# Patient Record
Sex: Female | Born: 1955 | Race: White | Hispanic: No | Smoking: Never smoker
Health system: Southern US, Community
[De-identification: ages and names within clinical notes are randomized; demographics above are authoritative.]

## PROBLEM LIST (undated history)

## (undated) DIAGNOSIS — I82432 Acute embolism and thrombosis of left popliteal vein: Secondary | ICD-10-CM

## (undated) DIAGNOSIS — B20 Human immunodeficiency virus [HIV] disease: Secondary | ICD-10-CM

## (undated) DIAGNOSIS — I509 Heart failure, unspecified: Secondary | ICD-10-CM

## (undated) DIAGNOSIS — E119 Type 2 diabetes mellitus without complications: Secondary | ICD-10-CM

## (undated) DIAGNOSIS — Z923 Personal history of irradiation: Secondary | ICD-10-CM

## (undated) DIAGNOSIS — I10 Essential (primary) hypertension: Secondary | ICD-10-CM

## (undated) DIAGNOSIS — C8110 Nodular sclerosis classical Hodgkin lymphoma, unspecified site: Secondary | ICD-10-CM

## (undated) DIAGNOSIS — M1990 Unspecified osteoarthritis, unspecified site: Secondary | ICD-10-CM

## (undated) HISTORY — DX: Human immunodeficiency virus (HIV) disease: B20

## (undated) HISTORY — DX: Nodular sclerosis Hodgkin lymphoma, unspecified site: C81.10

## (undated) HISTORY — PX: SHOULDER SURGERY: SHX246

## (undated) HISTORY — PX: WISDOM TOOTH EXTRACTION: SHX21

## (undated) HISTORY — DX: Personal history of irradiation: Z92.3

## (undated) HISTORY — DX: Acute embolism and thrombosis of left popliteal vein: I82.432

## (undated) HISTORY — DX: Type 2 diabetes mellitus without complications: E11.9

---

## 1994-09-21 ENCOUNTER — Encounter (INDEPENDENT_AMBULATORY_CARE_PROVIDER_SITE_OTHER): Payer: Self-pay | Admitting: *Deleted

## 1994-09-21 LAB — CONVERTED CEMR LAB: CD4 T Cell Abs: 396

## 1997-06-15 ENCOUNTER — Encounter: Admission: RE | Admit: 1997-06-15 | Discharge: 1997-06-15 | Payer: Self-pay | Admitting: Internal Medicine

## 1997-06-30 ENCOUNTER — Encounter: Admission: RE | Admit: 1997-06-30 | Discharge: 1997-06-30 | Payer: Self-pay | Admitting: Internal Medicine

## 1997-09-07 ENCOUNTER — Encounter: Admission: RE | Admit: 1997-09-07 | Discharge: 1997-09-07 | Payer: Self-pay | Admitting: Internal Medicine

## 1997-09-22 ENCOUNTER — Encounter: Admission: RE | Admit: 1997-09-22 | Discharge: 1997-09-22 | Payer: Self-pay | Admitting: Internal Medicine

## 1997-12-14 ENCOUNTER — Ambulatory Visit (HOSPITAL_COMMUNITY): Admission: RE | Admit: 1997-12-14 | Discharge: 1997-12-14 | Payer: Self-pay | Admitting: Internal Medicine

## 1997-12-28 ENCOUNTER — Encounter: Admission: RE | Admit: 1997-12-28 | Discharge: 1997-12-28 | Payer: Self-pay | Admitting: Internal Medicine

## 1998-03-03 ENCOUNTER — Encounter: Admission: RE | Admit: 1998-03-03 | Discharge: 1998-03-03 | Payer: Self-pay | Admitting: Infectious Diseases

## 1998-03-18 ENCOUNTER — Other Ambulatory Visit: Admission: RE | Admit: 1998-03-18 | Discharge: 1998-03-18 | Payer: Self-pay | Admitting: Internal Medicine

## 1998-03-18 ENCOUNTER — Encounter: Admission: RE | Admit: 1998-03-18 | Discharge: 1998-03-18 | Payer: Self-pay | Admitting: Internal Medicine

## 1998-05-24 ENCOUNTER — Ambulatory Visit (HOSPITAL_COMMUNITY): Admission: RE | Admit: 1998-05-24 | Discharge: 1998-05-24 | Payer: Self-pay | Admitting: Internal Medicine

## 1998-06-07 ENCOUNTER — Encounter: Admission: RE | Admit: 1998-06-07 | Discharge: 1998-06-07 | Payer: Self-pay | Admitting: Internal Medicine

## 1998-08-16 ENCOUNTER — Ambulatory Visit (HOSPITAL_COMMUNITY): Admission: RE | Admit: 1998-08-16 | Discharge: 1998-08-16 | Payer: Self-pay | Admitting: Internal Medicine

## 1998-08-31 ENCOUNTER — Encounter: Admission: RE | Admit: 1998-08-31 | Discharge: 1998-08-31 | Payer: Self-pay | Admitting: Internal Medicine

## 1999-01-03 ENCOUNTER — Encounter: Admission: RE | Admit: 1999-01-03 | Discharge: 1999-01-03 | Payer: Self-pay | Admitting: Internal Medicine

## 1999-01-03 ENCOUNTER — Ambulatory Visit (HOSPITAL_COMMUNITY): Admission: RE | Admit: 1999-01-03 | Discharge: 1999-01-03 | Payer: Self-pay | Admitting: Internal Medicine

## 1999-01-17 ENCOUNTER — Encounter: Admission: RE | Admit: 1999-01-17 | Discharge: 1999-01-17 | Payer: Self-pay | Admitting: Internal Medicine

## 1999-05-09 ENCOUNTER — Encounter: Admission: RE | Admit: 1999-05-09 | Discharge: 1999-05-09 | Payer: Self-pay | Admitting: Internal Medicine

## 1999-05-09 ENCOUNTER — Ambulatory Visit (HOSPITAL_COMMUNITY): Admission: RE | Admit: 1999-05-09 | Discharge: 1999-05-09 | Payer: Self-pay | Admitting: Internal Medicine

## 1999-05-23 ENCOUNTER — Encounter: Admission: RE | Admit: 1999-05-23 | Discharge: 1999-05-23 | Payer: Self-pay | Admitting: Internal Medicine

## 1999-11-21 ENCOUNTER — Encounter: Admission: RE | Admit: 1999-11-21 | Discharge: 1999-11-21 | Payer: Self-pay | Admitting: Internal Medicine

## 1999-11-21 ENCOUNTER — Ambulatory Visit (HOSPITAL_COMMUNITY): Admission: RE | Admit: 1999-11-21 | Discharge: 1999-11-21 | Payer: Self-pay | Admitting: Internal Medicine

## 1999-12-05 ENCOUNTER — Encounter: Admission: RE | Admit: 1999-12-05 | Discharge: 1999-12-05 | Payer: Self-pay | Admitting: Internal Medicine

## 2000-05-08 ENCOUNTER — Encounter: Admission: RE | Admit: 2000-05-08 | Discharge: 2000-05-08 | Payer: Self-pay | Admitting: Hematology and Oncology

## 2000-05-08 ENCOUNTER — Ambulatory Visit (HOSPITAL_COMMUNITY): Admission: RE | Admit: 2000-05-08 | Discharge: 2000-05-08 | Payer: Self-pay | Admitting: Internal Medicine

## 2000-05-22 ENCOUNTER — Encounter: Admission: RE | Admit: 2000-05-22 | Discharge: 2000-05-22 | Payer: Self-pay | Admitting: Internal Medicine

## 2000-05-22 LAB — TB SKIN TEST: TB Skin Test: NEGATIVE mm

## 2000-10-11 ENCOUNTER — Ambulatory Visit (HOSPITAL_COMMUNITY): Admission: RE | Admit: 2000-10-11 | Discharge: 2000-10-11 | Payer: Self-pay | Admitting: Internal Medicine

## 2000-10-11 ENCOUNTER — Encounter: Admission: RE | Admit: 2000-10-11 | Discharge: 2000-10-11 | Payer: Self-pay | Admitting: Internal Medicine

## 2000-10-25 ENCOUNTER — Encounter: Admission: RE | Admit: 2000-10-25 | Discharge: 2000-10-25 | Payer: Self-pay | Admitting: Internal Medicine

## 2001-01-07 ENCOUNTER — Encounter: Admission: RE | Admit: 2001-01-07 | Discharge: 2001-01-07 | Payer: Self-pay | Admitting: Internal Medicine

## 2001-01-07 ENCOUNTER — Ambulatory Visit (HOSPITAL_COMMUNITY): Admission: RE | Admit: 2001-01-07 | Discharge: 2001-01-07 | Payer: Self-pay | Admitting: Internal Medicine

## 2001-01-22 ENCOUNTER — Encounter: Admission: RE | Admit: 2001-01-22 | Discharge: 2001-01-22 | Payer: Self-pay | Admitting: Internal Medicine

## 2001-03-18 ENCOUNTER — Encounter: Admission: RE | Admit: 2001-03-18 | Discharge: 2001-03-18 | Payer: Self-pay | Admitting: Internal Medicine

## 2001-03-18 ENCOUNTER — Ambulatory Visit (HOSPITAL_COMMUNITY): Admission: RE | Admit: 2001-03-18 | Discharge: 2001-03-18 | Payer: Self-pay | Admitting: Internal Medicine

## 2001-04-02 ENCOUNTER — Encounter: Admission: RE | Admit: 2001-04-02 | Discharge: 2001-04-02 | Payer: Self-pay | Admitting: Internal Medicine

## 2001-05-17 ENCOUNTER — Encounter: Admission: RE | Admit: 2001-05-17 | Discharge: 2001-05-17 | Payer: Self-pay | Admitting: Internal Medicine

## 2001-07-15 ENCOUNTER — Ambulatory Visit (HOSPITAL_COMMUNITY): Admission: RE | Admit: 2001-07-15 | Discharge: 2001-07-15 | Payer: Self-pay | Admitting: Internal Medicine

## 2001-07-30 ENCOUNTER — Encounter: Admission: RE | Admit: 2001-07-30 | Discharge: 2001-07-30 | Payer: Self-pay | Admitting: Internal Medicine

## 2001-08-09 ENCOUNTER — Encounter: Admission: RE | Admit: 2001-08-09 | Discharge: 2001-08-09 | Payer: Self-pay | Admitting: *Deleted

## 2001-09-17 ENCOUNTER — Encounter: Admission: RE | Admit: 2001-09-17 | Discharge: 2001-09-17 | Payer: Self-pay | Admitting: Internal Medicine

## 2001-12-30 ENCOUNTER — Ambulatory Visit (HOSPITAL_COMMUNITY): Admission: RE | Admit: 2001-12-30 | Discharge: 2001-12-30 | Payer: Self-pay | Admitting: Internal Medicine

## 2001-12-30 ENCOUNTER — Encounter: Admission: RE | Admit: 2001-12-30 | Discharge: 2001-12-30 | Payer: Self-pay | Admitting: Internal Medicine

## 2002-01-14 ENCOUNTER — Encounter: Admission: RE | Admit: 2002-01-14 | Discharge: 2002-01-14 | Payer: Self-pay | Admitting: Internal Medicine

## 2002-03-03 ENCOUNTER — Ambulatory Visit (HOSPITAL_COMMUNITY): Admission: RE | Admit: 2002-03-03 | Discharge: 2002-03-03 | Payer: Self-pay | Admitting: Internal Medicine

## 2002-03-03 ENCOUNTER — Encounter: Admission: RE | Admit: 2002-03-03 | Discharge: 2002-03-03 | Payer: Self-pay | Admitting: Internal Medicine

## 2002-04-22 ENCOUNTER — Encounter: Admission: RE | Admit: 2002-04-22 | Discharge: 2002-04-22 | Payer: Self-pay | Admitting: Internal Medicine

## 2002-05-02 ENCOUNTER — Encounter: Admission: RE | Admit: 2002-05-02 | Discharge: 2002-05-02 | Payer: Self-pay | Admitting: Infectious Diseases

## 2002-07-08 ENCOUNTER — Encounter: Admission: RE | Admit: 2002-07-08 | Discharge: 2002-07-08 | Payer: Self-pay | Admitting: Internal Medicine

## 2002-11-03 ENCOUNTER — Ambulatory Visit (HOSPITAL_COMMUNITY): Admission: RE | Admit: 2002-11-03 | Discharge: 2002-11-03 | Payer: Self-pay | Admitting: Internal Medicine

## 2002-11-03 ENCOUNTER — Encounter: Admission: RE | Admit: 2002-11-03 | Discharge: 2002-11-03 | Payer: Self-pay | Admitting: Internal Medicine

## 2002-11-17 ENCOUNTER — Encounter: Admission: RE | Admit: 2002-11-17 | Discharge: 2002-11-17 | Payer: Self-pay | Admitting: Internal Medicine

## 2002-12-09 ENCOUNTER — Encounter: Admission: RE | Admit: 2002-12-09 | Discharge: 2002-12-09 | Payer: Self-pay | Admitting: Internal Medicine

## 2003-02-03 ENCOUNTER — Encounter: Admission: RE | Admit: 2003-02-03 | Discharge: 2003-02-03 | Payer: Self-pay | Admitting: Internal Medicine

## 2003-02-03 ENCOUNTER — Ambulatory Visit (HOSPITAL_COMMUNITY): Admission: RE | Admit: 2003-02-03 | Discharge: 2003-02-03 | Payer: Self-pay | Admitting: Internal Medicine

## 2003-02-06 ENCOUNTER — Encounter: Admission: RE | Admit: 2003-02-06 | Discharge: 2003-02-06 | Payer: Self-pay | Admitting: Infectious Diseases

## 2003-02-17 ENCOUNTER — Encounter: Admission: RE | Admit: 2003-02-17 | Discharge: 2003-02-17 | Payer: Self-pay | Admitting: Internal Medicine

## 2003-05-05 ENCOUNTER — Encounter: Admission: RE | Admit: 2003-05-05 | Discharge: 2003-05-05 | Payer: Self-pay | Admitting: Internal Medicine

## 2003-05-05 ENCOUNTER — Ambulatory Visit (HOSPITAL_COMMUNITY): Admission: RE | Admit: 2003-05-05 | Discharge: 2003-05-05 | Payer: Self-pay | Admitting: Internal Medicine

## 2003-05-19 ENCOUNTER — Encounter: Admission: RE | Admit: 2003-05-19 | Discharge: 2003-05-19 | Payer: Self-pay | Admitting: Internal Medicine

## 2003-07-21 ENCOUNTER — Encounter: Admission: RE | Admit: 2003-07-21 | Discharge: 2003-07-21 | Payer: Self-pay | Admitting: Internal Medicine

## 2003-08-18 ENCOUNTER — Encounter: Admission: RE | Admit: 2003-08-18 | Discharge: 2003-08-18 | Payer: Self-pay | Admitting: Internal Medicine

## 2003-08-18 ENCOUNTER — Ambulatory Visit (HOSPITAL_COMMUNITY): Admission: RE | Admit: 2003-08-18 | Discharge: 2003-08-18 | Payer: Self-pay | Admitting: Internal Medicine

## 2003-12-15 ENCOUNTER — Ambulatory Visit (HOSPITAL_COMMUNITY): Admission: RE | Admit: 2003-12-15 | Discharge: 2003-12-15 | Payer: Self-pay | Admitting: Internal Medicine

## 2003-12-15 ENCOUNTER — Ambulatory Visit: Payer: Self-pay | Admitting: Internal Medicine

## 2004-01-29 ENCOUNTER — Ambulatory Visit: Payer: Self-pay | Admitting: Infectious Diseases

## 2004-03-29 ENCOUNTER — Ambulatory Visit: Payer: Self-pay | Admitting: Internal Medicine

## 2004-03-29 ENCOUNTER — Ambulatory Visit (HOSPITAL_COMMUNITY): Admission: RE | Admit: 2004-03-29 | Discharge: 2004-03-29 | Payer: Self-pay | Admitting: Internal Medicine

## 2004-04-12 ENCOUNTER — Ambulatory Visit: Payer: Self-pay | Admitting: Internal Medicine

## 2004-09-04 ENCOUNTER — Emergency Department (HOSPITAL_COMMUNITY): Admission: EM | Admit: 2004-09-04 | Discharge: 2004-09-04 | Payer: Self-pay | Admitting: Emergency Medicine

## 2004-10-05 ENCOUNTER — Ambulatory Visit (HOSPITAL_COMMUNITY): Admission: RE | Admit: 2004-10-05 | Discharge: 2004-10-05 | Payer: Self-pay | Admitting: Internal Medicine

## 2004-10-05 ENCOUNTER — Ambulatory Visit: Payer: Self-pay | Admitting: Internal Medicine

## 2004-10-05 ENCOUNTER — Encounter (INDEPENDENT_AMBULATORY_CARE_PROVIDER_SITE_OTHER): Payer: Self-pay | Admitting: *Deleted

## 2004-10-05 LAB — CONVERTED CEMR LAB
CD4 Count: 840 microliters
HIV 1 RNA Quant: 399 copies/mL

## 2004-10-20 ENCOUNTER — Ambulatory Visit: Payer: Self-pay | Admitting: Internal Medicine

## 2004-11-08 ENCOUNTER — Encounter: Admission: RE | Admit: 2004-11-08 | Discharge: 2005-02-06 | Payer: Self-pay | Admitting: Internal Medicine

## 2005-02-23 ENCOUNTER — Emergency Department (HOSPITAL_COMMUNITY): Admission: EM | Admit: 2005-02-23 | Discharge: 2005-02-23 | Payer: Self-pay | Admitting: Family Medicine

## 2005-04-11 ENCOUNTER — Ambulatory Visit: Payer: Self-pay | Admitting: Internal Medicine

## 2005-04-11 ENCOUNTER — Encounter (INDEPENDENT_AMBULATORY_CARE_PROVIDER_SITE_OTHER): Payer: Self-pay | Admitting: *Deleted

## 2005-04-11 LAB — CONVERTED CEMR LAB: HIV 1 RNA Quant: 399 copies/mL

## 2005-04-25 ENCOUNTER — Ambulatory Visit: Payer: Self-pay | Admitting: Internal Medicine

## 2005-10-31 ENCOUNTER — Ambulatory Visit: Payer: Self-pay | Admitting: Internal Medicine

## 2005-10-31 ENCOUNTER — Encounter: Admission: RE | Admit: 2005-10-31 | Discharge: 2005-10-31 | Payer: Self-pay | Admitting: Internal Medicine

## 2005-10-31 ENCOUNTER — Encounter (INDEPENDENT_AMBULATORY_CARE_PROVIDER_SITE_OTHER): Payer: Self-pay | Admitting: *Deleted

## 2005-11-15 ENCOUNTER — Ambulatory Visit: Payer: Self-pay | Admitting: Internal Medicine

## 2006-02-06 ENCOUNTER — Ambulatory Visit: Payer: Self-pay | Admitting: Internal Medicine

## 2006-02-15 ENCOUNTER — Encounter: Payer: Self-pay | Admitting: Internal Medicine

## 2006-02-15 ENCOUNTER — Ambulatory Visit: Payer: Self-pay | Admitting: Internal Medicine

## 2006-02-23 DIAGNOSIS — F329 Major depressive disorder, single episode, unspecified: Secondary | ICD-10-CM | POA: Insufficient documentation

## 2006-02-23 DIAGNOSIS — B20 Human immunodeficiency virus [HIV] disease: Secondary | ICD-10-CM | POA: Insufficient documentation

## 2006-02-23 DIAGNOSIS — I10 Essential (primary) hypertension: Secondary | ICD-10-CM | POA: Insufficient documentation

## 2006-02-23 DIAGNOSIS — E785 Hyperlipidemia, unspecified: Secondary | ICD-10-CM | POA: Insufficient documentation

## 2006-02-23 HISTORY — DX: Human immunodeficiency virus (HIV) disease: B20

## 2006-03-16 ENCOUNTER — Encounter: Payer: Self-pay | Admitting: Internal Medicine

## 2006-04-16 ENCOUNTER — Encounter (INDEPENDENT_AMBULATORY_CARE_PROVIDER_SITE_OTHER): Payer: Self-pay | Admitting: *Deleted

## 2006-04-16 LAB — CONVERTED CEMR LAB

## 2006-04-29 ENCOUNTER — Encounter (INDEPENDENT_AMBULATORY_CARE_PROVIDER_SITE_OTHER): Payer: Self-pay | Admitting: *Deleted

## 2006-05-03 ENCOUNTER — Encounter: Admission: RE | Admit: 2006-05-03 | Discharge: 2006-05-03 | Payer: Self-pay | Admitting: Internal Medicine

## 2006-05-03 ENCOUNTER — Ambulatory Visit: Payer: Self-pay | Admitting: Internal Medicine

## 2006-05-03 LAB — CONVERTED CEMR LAB
ALT: 27 units/L (ref 0–35)
Alkaline Phosphatase: 48 units/L (ref 39–117)
Basophils Absolute: 0 10*3/uL (ref 0.0–0.1)
Basophils Relative: 0 % (ref 0–1)
Eosinophils Absolute: 0.2 10*3/uL (ref 0.0–0.7)
Eosinophils Relative: 2 % (ref 0–5)
HCT: 44.9 % (ref 36.0–46.0)
HIV 1 RNA Quant: 324 copies/mL — ABNORMAL HIGH (ref <50)
HIV-1 RNA Quant, Log: 2.51 — ABNORMAL HIGH (ref <1.70)
LDL Cholesterol: 144 mg/dL — ABNORMAL HIGH (ref 0–99)
MCHC: 33.6 g/dL (ref 30.0–36.0)
MCV: 105.9 fL — ABNORMAL HIGH (ref 78.0–100.0)
Platelets: 263 10*3/uL (ref 150–400)
RDW: 14.1 % — ABNORMAL HIGH (ref 11.5–14.0)
Sodium: 142 meq/L (ref 135–145)
Total Bilirubin: 0.4 mg/dL (ref 0.3–1.2)
Total CHOL/HDL Ratio: 6.1
Total Protein: 7.6 g/dL (ref 6.0–8.3)
VLDL: 65 mg/dL — ABNORMAL HIGH (ref 0–40)

## 2006-05-17 ENCOUNTER — Ambulatory Visit: Payer: Self-pay | Admitting: Internal Medicine

## 2006-05-17 DIAGNOSIS — E669 Obesity, unspecified: Secondary | ICD-10-CM | POA: Insufficient documentation

## 2006-05-17 DIAGNOSIS — G5600 Carpal tunnel syndrome, unspecified upper limb: Secondary | ICD-10-CM | POA: Insufficient documentation

## 2006-05-17 DIAGNOSIS — M79609 Pain in unspecified limb: Secondary | ICD-10-CM | POA: Insufficient documentation

## 2006-06-13 ENCOUNTER — Encounter: Payer: Self-pay | Admitting: Internal Medicine

## 2006-07-13 ENCOUNTER — Encounter: Payer: Self-pay | Admitting: Internal Medicine

## 2006-09-17 ENCOUNTER — Encounter (INDEPENDENT_AMBULATORY_CARE_PROVIDER_SITE_OTHER): Payer: Self-pay | Admitting: *Deleted

## 2006-11-05 ENCOUNTER — Encounter: Admission: RE | Admit: 2006-11-05 | Discharge: 2006-11-05 | Payer: Self-pay | Admitting: Internal Medicine

## 2006-11-05 ENCOUNTER — Ambulatory Visit: Payer: Self-pay | Admitting: Internal Medicine

## 2006-11-07 ENCOUNTER — Telehealth: Payer: Self-pay | Admitting: Internal Medicine

## 2006-11-20 ENCOUNTER — Ambulatory Visit: Payer: Self-pay | Admitting: Internal Medicine

## 2007-02-19 ENCOUNTER — Encounter: Payer: Self-pay | Admitting: Internal Medicine

## 2007-05-02 ENCOUNTER — Encounter: Admission: RE | Admit: 2007-05-02 | Discharge: 2007-05-02 | Payer: Self-pay | Admitting: Internal Medicine

## 2007-05-02 ENCOUNTER — Ambulatory Visit: Payer: Self-pay | Admitting: Internal Medicine

## 2007-05-02 LAB — CONVERTED CEMR LAB
AST: 14 units/L (ref 0–37)
Albumin: 4.4 g/dL (ref 3.5–5.2)
Alkaline Phosphatase: 48 units/L (ref 39–117)
BUN: 11 mg/dL (ref 6–23)
CO2: 26 meq/L (ref 19–32)
Calcium: 9.5 mg/dL (ref 8.4–10.5)
HCT: 46.9 % — ABNORMAL HIGH (ref 36.0–46.0)
HIV-1 RNA Quant, Log: 2.55 — ABNORMAL HIGH (ref <1.70)
Hemoglobin: 15.4 g/dL — ABNORMAL HIGH (ref 12.0–15.0)
LDL Cholesterol: 185 mg/dL — ABNORMAL HIGH (ref 0–99)
MCV: 108.3 fL — ABNORMAL HIGH (ref 78.0–100.0)
Platelets: 243 10*3/uL (ref 150–400)
RDW: 13.8 % (ref 11.5–15.5)
Total Protein: 7.5 g/dL (ref 6.0–8.3)
Triglycerides: 295 mg/dL — ABNORMAL HIGH (ref <150)
WBC: 7 10*3/uL (ref 4.0–10.5)

## 2007-05-10 ENCOUNTER — Encounter: Payer: Self-pay | Admitting: Internal Medicine

## 2007-05-14 ENCOUNTER — Ambulatory Visit: Payer: Self-pay | Admitting: Internal Medicine

## 2007-05-24 ENCOUNTER — Ambulatory Visit: Payer: Self-pay | Admitting: Internal Medicine

## 2007-05-24 ENCOUNTER — Encounter: Payer: Self-pay | Admitting: Internal Medicine

## 2007-05-24 LAB — CONVERTED CEMR LAB: Pap Smear: NORMAL

## 2007-05-29 ENCOUNTER — Encounter: Payer: Self-pay | Admitting: Internal Medicine

## 2007-06-10 ENCOUNTER — Encounter: Payer: Self-pay | Admitting: Internal Medicine

## 2007-06-17 ENCOUNTER — Encounter: Payer: Self-pay | Admitting: Internal Medicine

## 2007-08-08 ENCOUNTER — Emergency Department (HOSPITAL_COMMUNITY): Admission: EM | Admit: 2007-08-08 | Discharge: 2007-08-08 | Payer: Self-pay | Admitting: Family Medicine

## 2007-10-15 ENCOUNTER — Emergency Department (HOSPITAL_COMMUNITY): Admission: EM | Admit: 2007-10-15 | Discharge: 2007-10-15 | Payer: Self-pay | Admitting: Family Medicine

## 2007-10-20 ENCOUNTER — Encounter: Admission: RE | Admit: 2007-10-20 | Discharge: 2007-10-20 | Payer: Self-pay | Admitting: Orthopedic Surgery

## 2007-10-29 ENCOUNTER — Ambulatory Visit: Payer: Self-pay | Admitting: Internal Medicine

## 2007-10-29 LAB — CONVERTED CEMR LAB
ALT: 28 units/L (ref 0–35)
AST: 19 units/L (ref 0–37)
Albumin: 4.4 g/dL (ref 3.5–5.2)
Calcium: 9.8 mg/dL (ref 8.4–10.5)
Chloride: 103 meq/L (ref 96–112)
HIV 1 RNA Quant: 368 copies/mL — ABNORMAL HIGH (ref <50)
HIV-1 RNA Quant, Log: 2.57 — ABNORMAL HIGH (ref <1.70)
Platelets: 259 10*3/uL (ref 150–400)
Potassium: 4.7 meq/L (ref 3.5–5.3)
RDW: 14.4 % (ref 11.5–15.5)
Total CHOL/HDL Ratio: 7.2
Total Protein: 7.7 g/dL (ref 6.0–8.3)

## 2007-11-12 ENCOUNTER — Ambulatory Visit: Payer: Self-pay | Admitting: Internal Medicine

## 2007-11-12 DIAGNOSIS — M7500 Adhesive capsulitis of unspecified shoulder: Secondary | ICD-10-CM | POA: Insufficient documentation

## 2008-03-18 ENCOUNTER — Telehealth (INDEPENDENT_AMBULATORY_CARE_PROVIDER_SITE_OTHER): Payer: Self-pay | Admitting: *Deleted

## 2008-04-27 ENCOUNTER — Ambulatory Visit: Payer: Self-pay | Admitting: Internal Medicine

## 2008-04-27 LAB — CONVERTED CEMR LAB
ALT: 21 units/L (ref 0–35)
AST: 14 units/L (ref 0–37)
Alkaline Phosphatase: 44 units/L (ref 39–117)
Basophils Relative: 0 % (ref 0–1)
CO2: 23 meq/L (ref 19–32)
Cholesterol: 262 mg/dL — ABNORMAL HIGH (ref 0–200)
HIV 1 RNA Quant: 153 copies/mL — ABNORMAL HIGH (ref <48)
HIV-1 RNA Quant, Log: 2.18 — ABNORMAL HIGH (ref <1.68)
Hemoglobin: 14.6 g/dL (ref 12.0–15.0)
LDL Cholesterol: 161 mg/dL — ABNORMAL HIGH (ref 0–99)
Monocytes Relative: 7 % (ref 3–12)
Neutro Abs: 4.7 10*3/uL (ref 1.7–7.7)
Neutrophils Relative %: 55 % (ref 43–77)
RBC: 3.85 M/uL — ABNORMAL LOW (ref 3.87–5.11)
RDW: 14 % (ref 11.5–15.5)
Sodium: 143 meq/L (ref 135–145)
Total Bilirubin: 0.3 mg/dL (ref 0.3–1.2)
Total Protein: 7.1 g/dL (ref 6.0–8.3)
VLDL: 62 mg/dL — ABNORMAL HIGH (ref 0–40)

## 2008-05-12 ENCOUNTER — Ambulatory Visit: Payer: Self-pay | Admitting: Internal Medicine

## 2008-05-29 ENCOUNTER — Ambulatory Visit: Payer: Self-pay | Admitting: Internal Medicine

## 2008-05-29 ENCOUNTER — Encounter: Payer: Self-pay | Admitting: Internal Medicine

## 2008-06-04 ENCOUNTER — Encounter: Payer: Self-pay | Admitting: Internal Medicine

## 2008-06-23 ENCOUNTER — Encounter (INDEPENDENT_AMBULATORY_CARE_PROVIDER_SITE_OTHER): Payer: Self-pay | Admitting: *Deleted

## 2008-10-20 ENCOUNTER — Ambulatory Visit: Payer: Self-pay | Admitting: Internal Medicine

## 2008-10-22 ENCOUNTER — Encounter: Payer: Self-pay | Admitting: Internal Medicine

## 2008-11-03 ENCOUNTER — Ambulatory Visit: Payer: Self-pay | Admitting: Internal Medicine

## 2008-11-03 DIAGNOSIS — M719 Bursopathy, unspecified: Secondary | ICD-10-CM

## 2008-11-03 DIAGNOSIS — M67919 Unspecified disorder of synovium and tendon, unspecified shoulder: Secondary | ICD-10-CM | POA: Insufficient documentation

## 2008-11-05 ENCOUNTER — Encounter: Payer: Self-pay | Admitting: Internal Medicine

## 2008-11-06 ENCOUNTER — Encounter: Payer: Self-pay | Admitting: Internal Medicine

## 2008-12-02 ENCOUNTER — Ambulatory Visit: Payer: Self-pay | Admitting: Internal Medicine

## 2009-02-23 ENCOUNTER — Ambulatory Visit: Payer: Self-pay | Admitting: Internal Medicine

## 2009-02-23 LAB — CONVERTED CEMR LAB
Albumin: 4.2 g/dL (ref 3.5–5.2)
Alkaline Phosphatase: 42 units/L (ref 39–117)
BUN: 12 mg/dL (ref 6–23)
Basophils Absolute: 0 10*3/uL (ref 0.0–0.1)
Basophils Relative: 0 % (ref 0–1)
CO2: 23 meq/L (ref 19–32)
Cholesterol: 288 mg/dL — ABNORMAL HIGH (ref 0–200)
Eosinophils Relative: 2 % (ref 0–5)
Glucose, Bld: 93 mg/dL (ref 70–99)
HCT: 44.4 % (ref 36.0–46.0)
HDL: 45 mg/dL (ref >39)
HIV-1 RNA Quant, Log: 1.97 — ABNORMAL HIGH (ref <1.68)
Hemoglobin: 14.9 g/dL (ref 12.0–15.0)
LDL Cholesterol: 187 mg/dL — ABNORMAL HIGH (ref 0–99)
MCHC: 33.6 g/dL (ref 30.0–36.0)
MCV: 115.9 fL — ABNORMAL HIGH (ref >=78.0)
Monocytes Absolute: 0.6 10*3/uL (ref 0.1–1.0)
Monocytes Relative: 8 % (ref 3–12)
RDW: 14.3 % (ref 11.5–15.5)
Total Bilirubin: 0.5 mg/dL (ref 0.3–1.2)
Triglycerides: 282 mg/dL — ABNORMAL HIGH (ref <150)

## 2009-03-17 ENCOUNTER — Ambulatory Visit: Payer: Self-pay | Admitting: Internal Medicine

## 2009-03-23 ENCOUNTER — Encounter: Payer: Self-pay | Admitting: Internal Medicine

## 2009-06-17 ENCOUNTER — Ambulatory Visit: Payer: Self-pay | Admitting: Internal Medicine

## 2009-06-17 LAB — CONVERTED CEMR LAB
Cholesterol: 290 mg/dL — ABNORMAL HIGH (ref 0–200)
HDL: 44 mg/dL (ref >39)
HIV-1 RNA Quant, Log: <1.68 (ref <1.68)
Total CHOL/HDL Ratio: 6.6
VLDL: 57 mg/dL — ABNORMAL HIGH (ref 0–40)

## 2009-06-21 ENCOUNTER — Encounter: Payer: Self-pay | Admitting: Internal Medicine

## 2009-07-06 ENCOUNTER — Ambulatory Visit: Payer: Self-pay | Admitting: Internal Medicine

## 2009-07-23 ENCOUNTER — Ambulatory Visit: Payer: Self-pay | Admitting: Internal Medicine

## 2009-07-28 ENCOUNTER — Encounter (INDEPENDENT_AMBULATORY_CARE_PROVIDER_SITE_OTHER): Payer: Self-pay | Admitting: *Deleted

## 2009-07-30 ENCOUNTER — Encounter: Payer: Self-pay | Admitting: Internal Medicine

## 2009-08-26 ENCOUNTER — Encounter: Payer: Self-pay | Admitting: Internal Medicine

## 2009-11-23 ENCOUNTER — Emergency Department (HOSPITAL_COMMUNITY): Admission: EM | Admit: 2009-11-23 | Discharge: 2009-11-23 | Payer: Self-pay | Admitting: Family Medicine

## 2009-11-24 ENCOUNTER — Encounter: Payer: Self-pay | Admitting: Internal Medicine

## 2009-12-08 ENCOUNTER — Encounter: Payer: Self-pay | Admitting: Internal Medicine

## 2009-12-10 ENCOUNTER — Telehealth (INDEPENDENT_AMBULATORY_CARE_PROVIDER_SITE_OTHER): Payer: Self-pay | Admitting: Licensed Clinical Social Worker

## 2009-12-23 ENCOUNTER — Ambulatory Visit: Payer: Self-pay | Admitting: Internal Medicine

## 2010-01-18 ENCOUNTER — Ambulatory Visit: Payer: Self-pay | Admitting: Internal Medicine

## 2010-03-22 NOTE — Medication Information (Signed)
Summary: [facility]: Antiretroviral Therapy  [facility]: Antiretroviral Therapy   Imported By: Florinda Marker 03/23/2009 14:44:06  _____________________________________________________________________  External Attachment:    Type:   Image     Comment:   External Document

## 2010-03-22 NOTE — Assessment & Plan Note (Signed)
Summary: fu resfrom 1/17kam   CC:  follow-up visit and Depression.  History of Present Illness: Holly Stephens is in for her routine visit.  She says that she did not do as well with her diet and exercise over the holidays and has gained weight.  She stopped using the weight watchers program because of cost but is still trying to follow the diet and hopes to get back with her exercise soon.  She also had problems following her exercise routine because she developed an ingrown toenail on her left great toe that became infected.  She saw Dr. Trey Paula Petrinitz who treated her with an antibiotic and trim the toenail.  He is planning on doing some further work to prevent future ingrown toenails.  Depression History:      The patient denies a depressed mood most of the day and a diminished interest in her usual daily activities.         Preventive Screening-Counseling & Management  Alcohol-Tobacco     Alcohol drinks/day: 0     Smoking Status: never     Passive Smoke Exposure: no  Caffeine-Diet-Exercise     Caffeine use/day: yes     Does Patient Exercise: yes     Type of exercise: walking, exercise tape     Exercise (avg: min/session): <30     Times/week: 5  Hep-HIV-STD-Contraception     HIV Risk: no risk noted  Safety-Violence-Falls     Seat Belt Use: yes      Sexual History:  n/a.        Drug Use:  never.     Prior Medication List:  COMBIVIR 150-300 MG TABS (LAMIVUDINE-ZIDOVUDINE) Take 1 tablet by mouth two times a day LEXIVA 700 MG TABS (FOSAMPRENAVIR CALCIUM) Take 1 tablet by mouth two times a day NORVIR 100 MG CAPS (RITONAVIR) Take 1 capsule by mouth two times a day METOPROLOL TARTRATE 50 MG TABS (METOPROLOL TARTRATE) Take 1 tablet by mouth once a day   Current Allergies (reviewed today): ! AUGMENTIN Vital Signs:  Patient profile:   55 year old female Menstrual status:  postmenopausal Height:      61 inches (154.94 cm) Weight:      275.7 pounds (125.32 kg) BMI:     52.28 Temp:      98.6 degrees F (37.00 degrees C) oral Pulse rate:   92 / minute BP sitting:   129 / 79  (right arm) Cuff size:   regular  Vitals Entered By: Jennet Maduro RN (March 17, 2009 3:49 PM) CC: follow-up visit, Depression Is Patient Diabetic? No Pain Assessment Patient in pain? no      Nutritional Status BMI of > 30 = obese Nutritional Status Detail appetite "too good"  Have you ever been in a relationship where you felt threatened, hurt or afraid?No   Does patient need assistance? Functional Status Self care Ambulation Normal Comments no missed doses of rxes        Medication Adherence: 03/17/2009   Adherence to medications reviewed with patient. Counseling to provide adequate adherence provided                                Physical Exam  General:  alert and overweight-appearing.  she has gained 14 pounds since her last visit.  There is no change in her lipoatrophy  in the face and extremities.  She carries most of her weight centrally. Mouth:  good dentition and  pharynx pink and moist.   Lungs:  normal breath sounds.  no crackles and no wheezes.   Heart:  normal rate, regular rhythm, and no murmur.   Abdomen:  soft and non-tender.  obese. Skin:  no rashes.   Psych:  normally interactive, good eye contact, not anxious appearing, and not depressed appearing.     Impression & Recommendations:  Problem # 1:  HIV DISEASE (ICD-042) Her HIV infection his back under good control.  I am not sure if that one viral load of 1800 was an error or a transient blip. She did not have any resistance mutations detected on a recent genotype.  I recommended changing the Lexiva to persist and continuing her other medications.  This is likely to give her a higher barrier to resistance. Diagnostics Reviewed:  CD4: 760 (02/24/2009)   WBC: 8.0 (02/23/2009)   PMN (bands): 0 (04/27/2008)   Hgb: 14.9 (02/23/2009)   HCT: 44.4 (02/23/2009)   Platelets: 261 (02/23/2009) HIV genotype: TNP  (10/22/2008)   HIV-1 RNA: 94 (02/23/2009)   HBSAg: NEG (11/03/2008)  Problem # 2:  OBESITY NOS (ICD-278.00) I have encouraged her to try to get back on her food plan and exercise more regularly.  I suggested she try to find friends and coworkers who were interested in making similar changes. Orders: Est. Patient Level IV (44034)  Problem # 3:  HYPERTENSION (ICD-401.9) Her blood pressure remains under good control. Her updated medication list for this problem includes:    Metoprolol Tartrate 50 Mg Tabs (Metoprolol tartrate) .Marland Kitchen... Take 1 tablet by mouth once a day  Orders: Est. Patient Level IV (99214)  BP today: 129/79 Prior BP: 131/79 (12/02/2008)  Labs Reviewed: K+: 4.5 (02/23/2009) Creat: : 0.62 (02/23/2009)   Chol: 288 (02/23/2009)   HDL: 45 (02/23/2009)   LDL: 187 (02/23/2009)   TG: 282 (02/23/2009)  Problem # 4:  HYPERLIPIDEMIA (ICD-272.4) Her dyslipidemia persists and is stable.  I doubt that she will be able to make significant progress with dietary changes alone.  I will consider adding medical therapy at her next visit. Orders: Est. Patient Level IV (74259)  Labs Reviewed: SGOT: 18 (02/23/2009)   SGPT: 21 (02/23/2009)   HDL:45 (02/23/2009), 39 (04/27/2008)  LDL:187 (02/23/2009), 161 (04/27/2008)  Chol:288 (02/23/2009), 262 (04/27/2008)  Trig:282 (02/23/2009), 310 (04/27/2008)  Medications Added to Medication List This Visit: 1)  [name] 600 Mg Tabs (Darunavir ethanolate) .... Take 1 tablet by mouth two times a day  Other Orders: Future Orders: T-CD4SP ([facility]) (CD4SP) ... 06/15/2009 T-HIV Viral Load [phone]) ... 06/15/2009 T-Lipid Profile [phone]) ... 06/15/2009  Patient Instructions: 1)  Please schedule a follow-up appointment in 3 months.  Prescriptions: METOPROLOL TARTRATE 50 MG TABS (METOPROLOL TARTRATE) Take 1 tablet by mouth once a day  #90 x 3   Entered and Authorized by:   Cliffton Asters MD   Signed by:   Cliffton Asters MD on 03/17/2009    Method used:   Print then Give to Patient   RxID:   [id number] NORVIR 100 MG CAPS (RITONAVIR) Take 1 capsule by mouth two times a day  #180 x 3   Entered and Authorized by:   Cliffton Asters MD   Signed by:   Cliffton Asters MD on 03/17/2009   Method used:   Print then Give to Patient   RxID:   [id number] [name] 600 MG TABS (DARUNAVIR ETHANOLATE) Take 1 tablet by mouth two times a day  #180 x 3   Entered and Authorized by:  Cliffton Asters MD   Signed by:   Cliffton Asters MD on 03/17/2009   Method used:   Print then Give to Patient   RxID:   [id number] COMBIVIR 150-300 MG TABS (LAMIVUDINE-ZIDOVUDINE) Take 1 tablet by mouth two times a day  #180 x 3   Entered and Authorized by:   Cliffton Asters MD   Signed by:   Cliffton Asters MD on 03/17/2009   Method used:   Print then Give to Patient   RxID:   [id number]  Process Orders Check Orders Results:     [facility]: ABN not required for this insurance Tests Sent for requisitioning (March 17, 2009 4:31 PM):     06/15/2009: [facility] -- T-HIV Viral Load [phone] (signed)     06/15/2009: [facility] -- T-Lipid Profile [phone] (signed)         Medication Adherence: 03/17/2009   Adherence to medications reviewed with patient. Counseling to provide adequate adherence provided

## 2010-03-22 NOTE — Assessment & Plan Note (Signed)
Summary: F/U/VS   CC:  follow-up visit - will make PAP smear appt. on the way out today.  History of Present Illness: Holly Stephens is in for her routine visit.  She has rejoined [employer] at work and has been able to loose some weight.  She is planning on getting more regular exercise now that the weather has improved.  She has not missed any doses of her medications.  She is not sexually active.  Preventive Screening-Counseling & Management  Alcohol-Tobacco     Alcohol drinks/day: 0     Smoking Status: never     Passive Smoke Exposure: no  Caffeine-Diet-Exercise     Caffeine use/day: yes     Does Patient Exercise: yes     Type of exercise: walking, exercise tape     Exercise (avg: min/session): <30     Times/week: 5  Hep-HIV-STD-Contraception     HIV Risk: no risk noted  Safety-Violence-Falls     Seat Belt Use: yes  Comments: declined condoms      Sexual History:  /a.        Drug Use:  never.     Prior Medication List:  COMBIVIR 150-300 MG TABS (LAMIVUDINE-ZIDOVUDINE) Take 1 tablet by mouth two times a day [name] 600 MG TABS (DARUNAVIR ETHANOLATE) Take 1 tablet by mouth two times a day NORVIR 100 MG CAPS (RITONAVIR) Take 1 capsule by mouth two times a day METOPROLOL TARTRATE 50 MG TABS (METOPROLOL TARTRATE) Take 1 tablet by mouth once a day   Current Allergies (reviewed today): ! AUGMENTIN Social History: Sexual History:  /a  Vital Signs:  Patient profile:   55 year old female Menstrual status:  postmenopausal Height:      61 inches (154.94 cm) Weight:      267.25 pounds (121.48 kg) BMI:     50.68 Temp:     97.9 degrees F (36.61 degrees C) oral Pulse rate:   76 / minute BP sitting:   132 / 75  (right arm) Cuff size:   regular  Vitals Entered By: Jennet Maduro RN (Jul 06, 2009 9:19 AM) CC: follow-up visit - will make PAP smear appt. on the way out today Is Patient Diabetic? No Pain Assessment Patient in pain? no      Nutritional Status  BMI of > 30 = obese Nutritional Status Detail appetite "fine"  Have you ever been in a relationship where you felt threatened, hurt or afraid?No   Does patient need assistance? Functional Status Self care Ambulation Normal Comments no missed doses of rxes   Physical Exam  General:  alert and overweight-appearing, but she has lost 12 pounds. Mouth:  good dentition and pharynx pink and moist.   Lungs:  normal breath sounds.  no crackles and no wheezes.   Heart:  normal rate, regular rhythm, and no murmur.   Abdomen:  marked central adiposity. Skin:  no rashes.   Axillary Nodes:  no R axillary adenopathy and no L axillary adenopathy.   Psych:  normally interactive, good eye contact, not anxious appearing, and not depressed appearing.          Medication Adherence: 07/06/2009   Adherence to medications reviewed with patient. Counseling to provide adequate adherence provided                                 Impression & Recommendations:  Problem # 1:  HIV DISEASE (ICD-042) Her HIV infection  is under excellent control.  I will continue her current regimen. Diagnostics Reviewed:  CD4: 870 (06/17/2009)   WBC: 8.0 (02/23/2009)   PMN (bands): 0 (04/27/2008)   Hgb: 14.9 (02/23/2009)   HCT: 44.4 (02/23/2009)   Platelets: 261 (02/23/2009) HIV genotype: TNP (10/22/2008)   HIV-1 RNA: <48 copies/mL (06/17/2009)   HBSAg: NEG (11/03/2008)  Problem # 2:  OBESITY NOS (ICD-278.00) I have encouraged her to continue weight watchers with a goal of steady but sustained weight loss.  Also encouraged her to increase her exercise and to try to follow a low fat diet. Orders: Est. Patient Level IV (01027)  Problem # 3:  HYPERLIPIDEMIA (ICD-272.4) see above.  I will start pravastatin today. Her updated medication list for this problem includes:    Pravachol 40 Mg Tabs (Pravastatin sodium) .Marland Kitchen... Take 1 tablet by mouth once a day  Orders: Est. Patient Level IV (25366)  Problem # 4:   HYPERTENSION (ICD-401.9) Her blood pressure remains under good control. Her updated medication list for this problem includes:    Metoprolol Tartrate 50 Mg Tabs (Metoprolol tartrate) .Marland Kitchen... Take 1 tablet by mouth once a day  Orders: Est. Patient Level IV (44034)  Medications Added to Medication List This Visit: 1)  Pravachol 40 Mg Tabs (Pravastatin sodium) .... Take 1 tablet by mouth once a day  Other Orders: Future Orders: T-CD4SP ([facility]) (CD4SP) ... 01/02/2010 T-HIV Viral Load [phone]) ... 01/02/2010 T-Lipid Profile [phone]) ... 01/02/2010  Patient Instructions: 1)  Please schedule a follow-up appointment in 6 months. 2)  She and will schedule her annual pelvic exam and Pap smear. Prescriptions: PRAVACHOL 40 MG TABS (PRAVASTATIN SODIUM) Take 1 tablet by mouth once a day  #90 x 3   Entered and Authorized by:   Cliffton Asters MD   Signed by:   Cliffton Asters MD on 07/06/2009   Method used:   Print then Give to Patient   RxID:   [id number]

## 2010-03-22 NOTE — Letter (Signed)
Summary: Holly Stephens: Financial Application  [facility]: Financial Application   Imported By: Florinda Marker 11/30/2009 11:53:56  _____________________________________________________________________  External Attachment:    Type:   Image     Comment:   External Document

## 2010-03-22 NOTE — Miscellaneous (Signed)
Summary: clincal update/[facility]  Clinical Lists Changes  Observations: Added new observation of PCTFPL: 331.30  (07/28/2009 10:18) Added new observation of HOUSEINCOME: [address]109  (07/28/2009 10:18) Added new observation of FINASSESSDT: 07/26/2009  (07/28/2009 10:18) Added new observation of YEARLYEXPEN: 14098  (07/28/2009 10:18)

## 2010-03-22 NOTE — Assessment & Plan Note (Signed)
Summary: F/U/VS   CC:  Had a bad case of hives after changing her fabric softener, has to go to the Urgent Care and received prednisone and claritin to control the itching and rash.  Having problem from time to time with her knees, esp. going up and down stairs, and Depression.  History of Present Illness: Holly Stephens is in for her routine visit. She has not missed a single dose of her medication since her last visit. She has been taking Zumba classes in her ongoing attempt to lose weight.  She recently received her influenza vaccine at work.  Depression History:      The patient denies a depressed mood most of the day and a diminished interest in her usual daily activities.         Preventive Screening-Counseling & Management  Alcohol-Tobacco     Alcohol drinks/day: 0     Smoking Status: never     Passive Smoke Exposure: no  Caffeine-Diet-Exercise     Caffeine use/day: yes     Does Patient Exercise: yes     Type of exercise: walking, exercise tape, started Zumba     Exercise (avg: min/session): <30     Times/week: 5  Hep-HIV-STD-Contraception     HIV Risk: no risk noted     HIV Risk Counseling: not indicated-no HIV risk noted  Safety-Violence-Falls     Seat Belt Use: yes  Comments: declined condoms      Sexual History:  n/a.        Drug Use:  never.     Prior Medication List:  COMBIVIR 150-300 MG TABS (LAMIVUDINE-ZIDOVUDINE) Take 1 tablet by mouth two times a day [name] 600 MG TABS (DARUNAVIR ETHANOLATE) Take 1 tablet by mouth two times a day NORVIR 100 MG CAPS (RITONAVIR) Take 1 capsule by mouth two times a day METOPROLOL TARTRATE 50 MG TABS (METOPROLOL TARTRATE) Take 1 tablet by mouth once a day PRAVACHOL 40 MG TABS (PRAVASTATIN SODIUM) Take 1 tablet by mouth once a day   Current Allergies (reviewed today): ! AUGMENTIN Vital Signs:  Patient profile:   55 year old female Menstrual status:  postmenopausal Height:      61 inches (154.94 cm) Weight:      261  pounds (118.64 kg) BMI:     49.49 Temp:     97.6 degrees F (36.44 degrees C) oral Pulse rate:   71 / minute BP sitting:   132 / 76  (left arm) Cuff size:   regular  Vitals Entered By: Jennet Maduro RN (January 18, 2010 9:31 AM) CC: Had a bad case of hives after changing her fabric softener, has to go to the Urgent Care and received prednisone and claritin to control the itching and rash.  Having problem from time to time with her knees, esp. going up and down stairs, Depression Is Patient Diabetic? No Pain Assessment Patient in pain? no      Nutritional Status BMI of > 30 = obese Nutritional Status Detail appetite "fine"  Have you ever been in a relationship where you felt threatened, hurt or afraid?No   Does patient need assistance? Functional Status Self care Ambulation Normal Comments no missed doses of HIV rxes    Physical Exam  General:  alert and overweight-appearing.   Mouth:  good dentition and pharynx pink and moist.   Lungs:  normal breath sounds.  no crackles and no wheezes.   Heart:  normal rate, regular rhythm, and no murmur.   Skin:  no rashes.   Psych:  normally interactive, good eye contact, not anxious appearing, and not depressed appearing.     Impression & Recommendations:  Problem # 1:  HIV DISEASE (ICD-042) Unfortunately, her viral load was not done her recent blood work. However, her infection has been under very good control for many years and her adherence is excellent so I will continue her current regimen and repeat a full set of blood work before that visit in 3 months. Diagnostics Reviewed:  CD4: 820 (12/24/2009)   WBC: 8.0 (02/23/2009)   PMN (bands): 0 (04/27/2008)   Hgb: 14.9 (02/23/2009)   HCT: 44.4 (02/23/2009)   Platelets: 261 (02/23/2009) HIV genotype: TNP (10/22/2008)   HIV-1 RNA: <48 copies/mL (06/17/2009)   HBSAg: NEG (11/03/2008)  Problem # 2:  OBESITY NOS (ICD-278.00) I have encouraged her to continue to monitor her diet and  exercise regularly. Orders: Est. Patient Level IV ([address])  Problem # 3:  HYPERTENSION (ICD-401.9) Her blood pressure is well controlled. Her updated medication list for this problem includes:    Metoprolol Tartrate 50 Mg Tabs (Metoprolol tartrate) .Marland Kitchen... Take 1 tablet by mouth once a day  Orders: Est. Patient Level IV (60454)  Problem # 4:  HYPERLIPIDEMIA (ICD-272.4) Her lipid panel was not done as ordered.  She is not fasting currently so I will repeat her lab work before her visit in 3 months. Her updated medication list for this problem includes:    Pravachol 40 Mg Tabs (Pravastatin sodium) .Marland Kitchen... Take 1 tablet by mouth once a day  Orders: Est. Patient Level IV (09811)  Other Orders: Future Orders: T-CD4SP ([facility]) (CD4SP) ... 04/18/2010 T-HIV Viral Load [phone]) ... 04/18/2010 T-Comprehensive Metabolic Panel [phone]) ... 04/18/2010 T-CBC w/Diff ([id number]) ... 04/18/2010 T-RPR (Syphilis) [phone]) ... 04/18/2010 T-Lipid Profile [phone]) ... 04/18/2010  Patient Instructions: 1)  Please schedule a follow-up appointment in 3 months.      Influenza Immunization History:    Influenza # 1:  Historical (11/29/2009)

## 2010-03-22 NOTE — Assessment & Plan Note (Signed)
Summary: pap smear visit   Vital Signs:  Patient profile:   55 year old female Menstrual status:  postmenopausal  Vitals Entered By: Jennet Maduro RN (July 23, 2009 9:32 AM) CC: PAP smear visit.  Pt. declined condoms today.  Pt. given educational materials re:  HIV and women, PAP smears, heart disease, OIs, self-esteem, diet and exercise.   RN observed dry toenails and heels on bilateral feet.  RN discussed foot care to improve this dryness.  Pt. verbalized understanding.  Is Patient Diabetic? No Pain Assessment Patient in pain? no       Does patient need assistance? Functional Status Self care Ambulation Normal     Menstrual Status postmenopausal Last PAP Result NEGATIVE FOR INTRAEPITHELIAL LESIONS OR MALIGNANCY.   Preventive Screening-Counseling & Management  Alcohol-Tobacco     Alcohol drinks/day: 0     Smoking Status: never     Passive Smoke Exposure: no  Caffeine-Diet-Exercise     Caffeine use/day: yes     Does Patient Exercise: yes     Type of exercise: walking, exercise tape     Exercise (avg: min/session): <30     Times/week: 5  Hep-HIV-STD-Contraception     HIV Risk: no risk noted  Safety-Violence-Falls     Seat Belt Use: yes  Comments: declined condoms      Sexual History:  n/a.        Drug Use:  never.   Passive cigarette smoke exposure: no  Behavioral Health Assessment Frequency: 0 Do you use recreational drugs? never  Evaluation and Follow-Up  Prevention For Positives: 07/23/2009   Safe sex practices discussed with patient. Condoms offered. Prior Medications: COMBIVIR 150-300 MG TABS (LAMIVUDINE-ZIDOVUDINE) Take 1 tablet by mouth two times a day [name] 600 MG TABS (DARUNAVIR ETHANOLATE) Take 1 tablet by mouth two times a day NORVIR 100 MG CAPS (RITONAVIR) Take 1 capsule by mouth two times a day METOPROLOL TARTRATE 50 MG TABS (METOPROLOL TARTRATE) Take 1 tablet by mouth once a day PRAVACHOL 40 MG TABS (PRAVASTATIN SODIUM) Take 1 tablet  by mouth once a day Current Allergies: ! AUGMENTIN Orders Added: 1)  T-PAP [facility]) [78295] 2)  Est. Patient Level I [62130]             Prevention For Positives: 07/23/2009   Safe sex practices discussed with patient. Condoms offered.

## 2010-03-22 NOTE — Letter (Signed)
Summary: Results Follow-up Letter  [facility]  [address]   [place], [place] [address]   Phone: [phone]  Fax: [phone]          July 30, 2009  [address] [place], [place]  [address]  Dear Ms. Andrey Campanile,   The following are the results of your recent test(s):  Test     Result     Pap Smear    Normal_______  Not Normal_____       Comments:  Everything was fine.  I will see you in one year for your next PAP Smear.  Thank you for coming to the Center  Sincerely,    Jennet Maduro [facility]

## 2010-03-22 NOTE — Miscellaneous (Signed)
Summary: [facility]: Home Health Cert. & Plan Of Care  [facility]: Home Health Cert. & Plan Of Care   Imported By: Florinda Marker 12/29/2009 16:19:30  _____________________________________________________________________  External Attachment:    Type:   Image     Comment:   External Document

## 2010-03-22 NOTE — Miscellaneous (Signed)
Summary: [facility]CP Continuous Care:  HCP Continuous Care:   Imported By: Florinda Marker 09/09/2009 11:30:14  _____________________________________________________________________  External Attachment:    Type:   Image     Comment:   External Document

## 2010-03-22 NOTE — Progress Notes (Signed)
Summary: [facility] medications arrived  Phone Note Outgoing Call   Call placed by: Starleen Arms CMA,  December 10, 2009 4:44 PM Call placed to: Patient Summary of Call: [facility] medications arrived, left message on voicemail. Initial call taken by: Starleen Arms CMA,  December 10, 2009 4:46 PM

## 2010-05-04 LAB — T-HELPER CELL (CD4) - (RCID CLINIC ONLY): CD4 % Helper T Cell: 31 % — ABNORMAL LOW (ref 33–55)

## 2010-05-05 ENCOUNTER — Encounter: Payer: Self-pay | Admitting: Internal Medicine

## 2010-05-05 ENCOUNTER — Other Ambulatory Visit (INDEPENDENT_AMBULATORY_CARE_PROVIDER_SITE_OTHER): Payer: 59

## 2010-05-05 DIAGNOSIS — B20 Human immunodeficiency virus [HIV] disease: Secondary | ICD-10-CM

## 2010-05-05 LAB — CONVERTED CEMR LAB
ALT: 27 units/L (ref 0–35)
Alkaline Phosphatase: 49 units/L (ref 39–117)
CO2: 28 meq/L (ref 19–32)
Creatinine, Ser: 0.64 mg/dL (ref 0.40–1.20)
Eosinophils Absolute: 0.1 10*3/uL (ref 0.0–0.7)
Eosinophils Relative: 2 % (ref 0–5)
HCT: 44.6 % (ref 36.0–46.0)
HIV 1 RNA Quant: <20 copies/mL (ref <20)
HIV-1 RNA Quant, Log: <1.3 (ref <1.30)
LDL Cholesterol: 160 mg/dL — ABNORMAL HIGH (ref 0–99)
Lymphs Abs: 2.7 10*3/uL (ref 0.7–4.0)
MCHC: 33.6 g/dL (ref 30.0–36.0)
MCV: 115.2 fL — ABNORMAL HIGH (ref 78.0–100.0)
Monocytes Relative: 7 % (ref 3–12)
RBC: 3.87 M/uL (ref 3.87–5.11)
Total Bilirubin: 0.5 mg/dL (ref 0.3–1.2)
VLDL: 46 mg/dL — ABNORMAL HIGH (ref 0–40)
WBC: 7.6 10*3/uL (ref 4.0–10.5)

## 2010-05-08 LAB — T-HELPER CELL (CD4) - (RCID CLINIC ONLY)
CD4 % Helper T Cell: 31 % — ABNORMAL LOW (ref 33–55)
CD4 T Cell Abs: 760 uL (ref 400–2700)

## 2010-05-10 LAB — T-HELPER CELL (CD4) - (RCID CLINIC ONLY)
CD4 % Helper T Cell: 30 % — ABNORMAL LOW (ref 33–55)
CD4 T Cell Abs: 870 uL (ref 400–2700)

## 2010-05-17 ENCOUNTER — Encounter: Payer: Self-pay | Admitting: Internal Medicine

## 2010-05-17 ENCOUNTER — Ambulatory Visit (INDEPENDENT_AMBULATORY_CARE_PROVIDER_SITE_OTHER): Payer: 59 | Admitting: Internal Medicine

## 2010-05-17 VITALS — BP 144/80 | HR 56 | Temp 97.9°F | Ht 61.0 in | Wt 266.2 lb

## 2010-05-17 DIAGNOSIS — B20 Human immunodeficiency virus [HIV] disease: Secondary | ICD-10-CM

## 2010-05-17 NOTE — Progress Notes (Signed)
Holly Stephens is doing very well. She does not miss doses of her meds. She is enjoying Zumba classes.  Physical Examination: General appearance - alert, well appearing, and in no distress Mouth - mucous membranes moist, pharynx normal without lesions Chest - clear to auscultation, no wheezes, rales or rhonchi, symmetric air entry Heart - normal rate, regular rhythm, normal S1, S2, no murmurs

## 2010-05-17 NOTE — Assessment & Plan Note (Signed)
Holly Stephens's infection remains under excellent control. I will continue her current regimen.

## 2010-05-25 ENCOUNTER — Other Ambulatory Visit: Payer: Self-pay | Admitting: Internal Medicine

## 2010-05-25 DIAGNOSIS — E785 Hyperlipidemia, unspecified: Secondary | ICD-10-CM

## 2010-07-20 ENCOUNTER — Encounter: Payer: Self-pay | Admitting: Internal Medicine

## 2010-10-31 ENCOUNTER — Other Ambulatory Visit (INDEPENDENT_AMBULATORY_CARE_PROVIDER_SITE_OTHER): Payer: 59

## 2010-10-31 DIAGNOSIS — B20 Human immunodeficiency virus [HIV] disease: Secondary | ICD-10-CM

## 2010-10-31 LAB — CBC WITH DIFFERENTIAL/PLATELET
Basophils Absolute: 0 10*3/uL (ref 0.0–0.1)
Basophils Relative: 0 % (ref 0–1)
Eosinophils Absolute: 0.2 10*3/uL (ref 0.0–0.7)
HCT: 43.7 % (ref 36.0–46.0)
Hemoglobin: 15.2 g/dL — ABNORMAL HIGH (ref 12.0–15.0)
MCH: 39.9 pg — ABNORMAL HIGH (ref 26.0–34.0)
MCHC: 34.8 g/dL (ref 30.0–36.0)
Monocytes Absolute: 0.5 10*3/uL (ref 0.1–1.0)
Monocytes Relative: 7 % (ref 3–12)
Neutro Abs: 3.5 10*3/uL (ref 1.7–7.7)
Neutrophils Relative %: 50 % (ref 43–77)
RDW: 14 % (ref 11.5–15.5)

## 2010-11-01 LAB — COMPLETE METABOLIC PANEL WITH GFR
AST: 20 U/L (ref 0–37)
Alkaline Phosphatase: 50 U/L (ref 39–117)
BUN: 13 mg/dL (ref 6–23)
GFR, Est Non African American: >60 mL/min (ref >60)
Glucose, Bld: 92 mg/dL (ref 70–99)
Potassium: 4.6 mEq/L (ref 3.5–5.3)
Total Bilirubin: 0.5 mg/dL (ref 0.3–1.2)

## 2010-11-01 LAB — HIV-1 RNA QUANT-NO REFLEX-BLD
HIV 1 RNA Quant: <20 copies/mL (ref <20)
HIV-1 RNA Quant, Log: <1.3 {Log} (ref <1.30)

## 2010-11-01 LAB — T-HELPER CELL (CD4) - (RCID CLINIC ONLY)
CD4 % Helper T Cell: 28 % — ABNORMAL LOW (ref 33–55)
CD4 T Cell Abs: 850 uL (ref 400–2700)

## 2010-11-14 LAB — T-HELPER CELL (CD4) - (RCID CLINIC ONLY)
CD4 % Helper T Cell: 29 — ABNORMAL LOW
CD4 T Cell Abs: 780

## 2010-11-15 ENCOUNTER — Encounter: Payer: Self-pay | Admitting: Internal Medicine

## 2010-11-15 ENCOUNTER — Ambulatory Visit (INDEPENDENT_AMBULATORY_CARE_PROVIDER_SITE_OTHER): Payer: 59 | Admitting: Internal Medicine

## 2010-11-15 VITALS — BP 133/77 | HR 62 | Temp 98.5°F | Ht 61.0 in | Wt 271.5 lb

## 2010-11-15 DIAGNOSIS — M545 Low back pain, unspecified: Secondary | ICD-10-CM

## 2010-11-15 DIAGNOSIS — E669 Obesity, unspecified: Secondary | ICD-10-CM

## 2010-11-15 DIAGNOSIS — I10 Essential (primary) hypertension: Secondary | ICD-10-CM

## 2010-11-15 DIAGNOSIS — Z23 Encounter for immunization: Secondary | ICD-10-CM

## 2010-11-15 DIAGNOSIS — B20 Human immunodeficiency virus [HIV] disease: Secondary | ICD-10-CM

## 2010-11-15 NOTE — Progress Notes (Signed)
  Subjective:    Patient ID: Holly Stephens, female    DOB: 16-Jun-1955, 55 y.o.   MRN: [id number]  HPI Holly Stephens is in for her routine visit. She states she is doing well with the exception of some mild, recurrent low back pain that began recently. She did not have any injury. The pain is most noticeable in her mid low back radiating to the left hip. She has not had any weakness, numbness or tingling in her left leg. This has not limited her activity and she plans on continuing her exercise classes this week. She has not been as diligent with her diet and exercise recently and has gained weight. She states that when she does not plan  ahead she tends to fall back into bad habits. She recalls missing only one dose of her HIV medications when she fell asleep and forgot.    Review of Systems     Objective:   Physical Exam  Constitutional: No distress.       Her weight is up 11 pounds since her last visit with a BMI that's over 50.  HENT:  Mouth/Throat: Oropharynx is clear and moist. No oropharyngeal exudate.  Eyes: Conjunctivae are normal.  Cardiovascular: Normal heart sounds.   No murmur heard. Pulmonary/Chest: Breath sounds normal.  Abdominal:       She has extreme central adiposity.  Neurological: She has normal reflexes.       She has no difficulty getting up from a seated position her gait is normal.          Assessment & Plan:

## 2010-11-15 NOTE — Assessment & Plan Note (Signed)
I have suggested that she consider a referral to the local bariatric center for evaluation. She will consider this.

## 2010-11-15 NOTE — Assessment & Plan Note (Signed)
Her infection remains under excellent control with an undetectable viral load of less than 20 and her CD4 count over 800. I will continue her current regimen.

## 2010-11-15 NOTE — Assessment & Plan Note (Signed)
Her blood pressure is under better control. I will continue Lopressor

## 2010-11-15 NOTE — Assessment & Plan Note (Signed)
She has mild recurrent low back pain. She is aware that a major contributor is her morbid obesity.

## 2010-12-01 LAB — T-HELPER CELL (CD4) - (RCID CLINIC ONLY): CD4 T Cell Abs: 990

## 2010-12-16 ENCOUNTER — Ambulatory Visit (INDEPENDENT_AMBULATORY_CARE_PROVIDER_SITE_OTHER): Payer: 59 | Admitting: Internal Medicine

## 2010-12-16 DIAGNOSIS — Z124 Encounter for screening for malignant neoplasm of cervix: Secondary | ICD-10-CM

## 2010-12-16 NOTE — Progress Notes (Signed)
  Subjective:     Holly Stephens is a 55 y.o. woman who comes in today for a  pap smear only.  Review of Systems   Objective:    There were no vitals taken for this visit. Pelvic Exam:  Pap smear obtained.   Assessment:    Screening pap smear.   Plan:    Follow up in one year, or as indicated by Pap results.

## 2010-12-16 NOTE — Patient Instructions (Signed)
  Results will be available in about a week.  I will mail them to you.  Thank you for coming to the Center for your care.  Angelique Blonder

## 2010-12-21 ENCOUNTER — Encounter: Payer: Self-pay | Admitting: *Deleted

## 2011-03-31 ENCOUNTER — Other Ambulatory Visit: Payer: Self-pay | Admitting: Internal Medicine

## 2011-03-31 DIAGNOSIS — B20 Human immunodeficiency virus [HIV] disease: Secondary | ICD-10-CM

## 2011-04-05 ENCOUNTER — Other Ambulatory Visit: Payer: Self-pay | Admitting: *Deleted

## 2011-04-05 DIAGNOSIS — B20 Human immunodeficiency virus [HIV] disease: Secondary | ICD-10-CM

## 2011-04-05 MED ORDER — RITONAVIR 100 MG PO TABS: 100.0000 mg | ORAL_TABLET | Freq: Two times a day (BID) | ORAL | Status: DC

## 2011-04-21 ENCOUNTER — Other Ambulatory Visit: Payer: Self-pay | Admitting: Internal Medicine

## 2011-04-27 ENCOUNTER — Other Ambulatory Visit: Payer: Self-pay | Admitting: Internal Medicine

## 2011-04-27 DIAGNOSIS — I10 Essential (primary) hypertension: Secondary | ICD-10-CM

## 2011-05-29 ENCOUNTER — Other Ambulatory Visit: Payer: 59

## 2011-05-29 DIAGNOSIS — B20 Human immunodeficiency virus [HIV] disease: Secondary | ICD-10-CM

## 2011-05-29 LAB — CBC
HCT: 44.2 % (ref 36.0–46.0)
MCV: 115.7 fL — ABNORMAL HIGH (ref 78.0–100.0)
RBC: 3.82 MIL/uL — ABNORMAL LOW (ref 3.87–5.11)
RDW: 13.8 % (ref 11.5–15.5)
WBC: 7.5 10*3/uL (ref 4.0–10.5)

## 2011-05-29 LAB — COMPREHENSIVE METABOLIC PANEL
BUN: 15 mg/dL (ref 6–23)
CO2: 27 mEq/L (ref 19–32)
Calcium: 9.7 mg/dL (ref 8.4–10.5)
Chloride: 104 mEq/L (ref 96–112)
Creat: 0.61 mg/dL (ref 0.50–1.10)

## 2011-05-29 LAB — LIPID PANEL
Cholesterol: 240 mg/dL — ABNORMAL HIGH (ref 0–200)
HDL: 47 mg/dL
LDL Cholesterol: 143 mg/dL — ABNORMAL HIGH (ref 0–99)
Total CHOL/HDL Ratio: 5.1 ratio
Triglycerides: 251 mg/dL — ABNORMAL HIGH
VLDL: 50 mg/dL — ABNORMAL HIGH (ref 0–40)

## 2011-05-29 LAB — SYPHILIS: RPR W/REFLEX TO RPR TITER AND TREPONEMAL ANTIBODIES, TRADITIONAL SCREENING AND DIAGNOSIS ALGORITHM

## 2011-05-31 ENCOUNTER — Other Ambulatory Visit: Payer: Self-pay | Admitting: Internal Medicine

## 2011-06-12 ENCOUNTER — Ambulatory Visit (INDEPENDENT_AMBULATORY_CARE_PROVIDER_SITE_OTHER): Payer: 59 | Admitting: Internal Medicine

## 2011-06-12 ENCOUNTER — Encounter: Payer: Self-pay | Admitting: Internal Medicine

## 2011-06-12 VITALS — BP 148/76 | HR 80 | Temp 98.1°F | Wt 284.0 lb

## 2011-06-12 DIAGNOSIS — I10 Essential (primary) hypertension: Secondary | ICD-10-CM

## 2011-06-12 DIAGNOSIS — E669 Obesity, unspecified: Secondary | ICD-10-CM

## 2011-06-12 DIAGNOSIS — B20 Human immunodeficiency virus [HIV] disease: Secondary | ICD-10-CM

## 2011-06-12 DIAGNOSIS — E785 Hyperlipidemia, unspecified: Secondary | ICD-10-CM

## 2011-06-12 MED ORDER — LAMIVUDINE-ZIDOVUDINE 150-300 MG PO TABS: 1.0000 | ORAL_TABLET | Freq: Two times a day (BID) | ORAL | Status: DC

## 2011-06-12 MED ORDER — DARUNAVIR ETHANOLATE 800 MG PO TABS: 800.0000 mg | ORAL_TABLET | Freq: Every day | ORAL | Status: DC

## 2011-06-12 MED ORDER — METOPROLOL TARTRATE 50 MG PO TABS: 50.0000 mg | ORAL_TABLET | Freq: Every day | ORAL | Status: DC

## 2011-06-12 MED ORDER — FENOFIBRATE 145 MG PO TABS: 145.0000 mg | ORAL_TABLET | Freq: Every day | ORAL | Status: DC

## 2011-06-12 NOTE — Progress Notes (Signed)
Addended by: Jennet Maduro D on: 06/12/2011 04:50 PM   Modules accepted: Orders

## 2011-06-12 NOTE — Progress Notes (Signed)
Patient ID: Holly Stephens, female   DOB: Oct 16, 1955, 56 y.o.   MRN: [id number]  INFECTIOUS DISEASE PROGRESS NOTE    Subjective: Holly Stephens is in for her routine visit. She thinks she's only missed one or 2 doses of her evening HIV medications in the last 6 months when she fell asleep and forgot. She is feeling well but is frustrated by her weight gain. She states she's been trying the Atkins diet with her mother her but has trouble sticking with it is considering going back to Weight Watchers. She is doing 1 hours in the classes twice weekly and doing weight training one day a week. Her job recently he also started a program we are employees where pedometers encounter daily steps.  Objective: Temp: 98.1 F (36.7 C) (04/22 1531) Temp src: Oral (04/22 1531) BP: 148/76 mmHg (04/22 1531) Pulse Rate: 80  (04/22 1531)  General: Her weight and BMI are up. Her BMI is now 53 Skin: No rash Lungs: Clear Cor: Regular S1 and S2 no murmurs Abdomen: Morbidly obese   Lab Results HIV 1 RNA Quant (copies/mL)  Date Value  05/29/2011 <20   10/31/2010 <20   05/05/2010 <20 copies/mL      CD4 T Cell Abs (cmm)  Date Value  05/29/2011 780   10/31/2010 850   12/23/2009 820      Assessment: Her HIV infection remains under excellent control. I will continue her current regimen.  I've encouraged her to increase her activity and look for every possible way to be more active. I will also make referral for nutritional counseling and lifestyle modification.  Her total cholesterol, LDL cholesterol and triglycerides remain elevated. I will add fenofibrate.  Plan: 1. Continue Combivir, [name] and Norvir  2. Lifestyle modification 3. Continue Lopressor and pravastatin 4. Add fenofibrate 5. Return in 3 months after repeat lipid panel   Cliffton Asters, MD [facility] for Infectious Diseases [facility] [phone] pager   [phone] cell 06/12/2011, 3:52 PM

## 2011-07-19 ENCOUNTER — Encounter: Payer: Self-pay | Admitting: Internal Medicine

## 2011-08-10 ENCOUNTER — Telehealth: Payer: Self-pay | Admitting: Licensed Clinical Social Worker

## 2011-08-10 NOTE — Telephone Encounter (Signed)
Per Dr. Maurice March I called the patient to see how her vision was, because she complained to him about possible floaters. He wants her referred to an opthamologist if she is still having problems.

## 2011-08-11 ENCOUNTER — Encounter: Payer: 59 | Admitting: Dietician

## 2011-08-11 NOTE — Telephone Encounter (Signed)
I called the patient today and she states that she will like to see how she feels next week and she will give [place] a call if her floaters worsen, or become bothersome. I told the patient that we could refer her to see an eye doctor.

## 2011-08-14 ENCOUNTER — Encounter: Payer: Self-pay | Admitting: Dietician

## 2011-08-14 ENCOUNTER — Ambulatory Visit (INDEPENDENT_AMBULATORY_CARE_PROVIDER_SITE_OTHER): Payer: 59 | Admitting: Dietician

## 2011-08-14 VITALS — Ht 61.0 in | Wt 293.6 lb

## 2011-08-14 DIAGNOSIS — E669 Obesity, unspecified: Secondary | ICD-10-CM

## 2011-08-14 NOTE — Progress Notes (Signed)
Medical Nutrition Therapy:  Appt start time: 0935 end time:  1040.  Assessment:  Primary concerns today: Weight management.  BMI 55. Estimated needs ~ 3000 calories/day. Estimated intake based on patient report: 2000 calories/day.  Usual eating pattern includes 3 meals and 3 snacks per day. Low activity type work and interests; sewing and reading, was doing zumba 2x/week and strength training once weekly until recently. Has pedometer and was reports that she was doing 5000- 10000 steps on most days. Has been successful with 50 # weight loss using Weight watcher's in past. Everyday foods include water, coffee, crystal lite.  Avoided foods include high fat foods.  Fast foods ~ 3 times a week.   24-hr recall:(5:15 AM)-instant oatmeal and coffee with fat free creamer,  (9AM)- yogurt coffee (11:30  PM)- , (2:30  PM)- cereal bar, (5:30PM)- fast foods/ frozen dinner with vegetables, soup and sandwich (PM)- popcorn, decaf coffee or cereal bar Weight loss ability Confidence self rated at "2/5", importance self rated " 5/5"  Progress Towards Goal(s):  In progress.   Nutritional Diagnosis:   NB-1.4 Self-monitoring deficit As related to lack of previous exposure to alternative methods of self monitoring  As evidenced by patient report.    Intervention:   1- Nutrition education about options for self-monitoring 2- Nutrition counseling on self confidence skills in weight loss. 3- Social support addressed. Agree patient likely to do well with bariatric surgery.  4- Nutrition education: weight loss principles, separating Evidence from myths.    Monitoring/Evaluation:  Dietary intake, exercise, and body weight in 3 week(s).

## 2011-08-14 NOTE — Patient Instructions (Addendum)
You are doing a great job with timing of your meals, reading labels and remembering "points".   Please keep at least 3 days of detailed food records over the next 3 weeks. (if you'd like, you can complete the calorie sections.)  Please read the handout on emotional eating or can pull find something online about emotional eating to read  instead.   Please bring pedometer with your to your next visit in 3 weeks.   Holly Stephens [phone]

## 2011-08-15 ENCOUNTER — Other Ambulatory Visit: Payer: Self-pay | Admitting: Internal Medicine

## 2011-08-15 DIAGNOSIS — IMO0002 Reserved for concepts with insufficient information to code with codable children: Secondary | ICD-10-CM

## 2011-08-29 ENCOUNTER — Other Ambulatory Visit: Payer: 59

## 2011-08-29 DIAGNOSIS — E785 Hyperlipidemia, unspecified: Secondary | ICD-10-CM

## 2011-08-29 LAB — LIPID PANEL
HDL: 42 mg/dL (ref >39)
LDL Cholesterol: 126 mg/dL — ABNORMAL HIGH (ref 0–99)
Triglycerides: 206 mg/dL — ABNORMAL HIGH (ref <150)
VLDL: 41 mg/dL — ABNORMAL HIGH (ref 0–40)

## 2011-09-04 ENCOUNTER — Ambulatory Visit (INDEPENDENT_AMBULATORY_CARE_PROVIDER_SITE_OTHER): Payer: 59 | Admitting: Dietician

## 2011-09-04 DIAGNOSIS — E669 Obesity, unspecified: Secondary | ICD-10-CM

## 2011-09-04 NOTE — Progress Notes (Signed)
Medical Nutrition Therapy:  Appt start time: 0935 end time:  1030.  Assessment:  Primary concerns today: Weight management.  BMI 55. Estimated needs ~ 3000 calories/day. Estimated intake based on patient records: 1500 calories & 60 grams fat/day. Food record show average of 83 grams fat/daya dn 1500 calories. Patient feels she may have under reported usual intake. She was surprised at fat content of foods. Low activity:Zumba starting.  Has started back wearing pedometer. Everyday foods include water, coffee, crystal lite, fast food.    24-hr recall: low in fruit and vegetables.   Progress Towards Goal(s):  In progress.   Nutritional Diagnosis:   NB-1.4 Self-monitoring deficit As related to lack of previous exposure to alternative methods of self monitoring improving  As evidenced by patient food records and report that she is weighing at home.    Intervention:   1- Nutrition education about energy density, vitamin D, estimated needs for weight loss. 2- Nutrition counseling on positive self talk, mindful eating.  3- Social support addressed.  4- Nutrition education: grocery shopping skills, separating evidence from myths.    Monitoring/Evaluation:  Dietary intake, exercise steps, and body weight in 4 week(s).

## 2011-09-04 NOTE — Patient Instructions (Addendum)
Please make a grocery shopping list: 4 columns- 1-how many servings do i need a day x days between shopping trips -  2-how many servings on hand - 3 how many servings I plan to eat out= 4-How many servings I need to buy  Please keep track of your steps each day-  Please keep track of fat grams and limit to 60 grams a day.   Consider witting HALT on daily food records  Make a follow up in 4 weeks

## 2011-09-12 ENCOUNTER — Encounter: Payer: Self-pay | Admitting: Internal Medicine

## 2011-09-12 ENCOUNTER — Ambulatory Visit (INDEPENDENT_AMBULATORY_CARE_PROVIDER_SITE_OTHER): Payer: 59 | Admitting: Internal Medicine

## 2011-09-12 VITALS — BP 137/76 | HR 81 | Temp 97.6°F | Ht 61.0 in | Wt 292.0 lb

## 2011-09-12 DIAGNOSIS — H43399 Other vitreous opacities, unspecified eye: Secondary | ICD-10-CM

## 2011-09-12 DIAGNOSIS — B20 Human immunodeficiency virus [HIV] disease: Secondary | ICD-10-CM

## 2011-09-12 NOTE — Progress Notes (Signed)
Patient ID: Holly Stephens, female   DOB: October 06, 1955, 56 y.o.   MRN: [id number]     [facility] for Infectious Disease  Patient Active Problem List  Diagnosis  . HIV DISEASE  . HYPERLIPIDEMIA  . OBESITY NOS  . DEPRESSION  . SYNDROME, CARPAL TUNNEL  . HYPERTENSION  . ADHESIVE CAPSULITIS OF SHOULDER  . ROTATOR CUFF INJURY, RIGHT SHOULDER  . FOOT PAIN  . Episodic low back pain  . Visual floaters    Patient's Medications  New Prescriptions   No medications on file  Previous Medications   DARUNAVIR ETHANOLATE ([name]) 800 MG TABLET    Take 1 tablet (800 mg total) by mouth daily.   FENOFIBRATE (TRICOR) 145 MG TABLET    Take 1 tablet (145 mg total) by mouth daily.   LAMIVUDINE-ZIDOVUDINE (COMBIVIR) 150-300 MG PER TABLET    Take 1 tablet by mouth 2 (two) times daily.   METOPROLOL (LOPRESSOR) 50 MG TABLET    Take 1 tablet (50 mg total) by mouth daily.   MULTIPLE VITAMIN (MULTIVITAMIN) TABLET    Take 1 tablet by mouth daily.     PRAVASTATIN (PRAVACHOL) 40 MG TABLET    TAKE 1 TABLET ONCE DAILY   RITONAVIR (NORVIR) 100 MG TABS    Take 1 tablet (100 mg total) by mouth 2 (two) times daily with a meal.  Modified Medications   No medications on file  Discontinued Medications   No medications on file    Subjective: Holly Stephens is in for her routine visit. As usual, she denies missing any of her antiretroviral since her last visit here she has had 2 visits with the nutritionist and has found that helpful. She is working hard on lifestyle modification. Her only new problem has been some visual floaters in her right eye over the last 3-4 weeks. She has not yet set up an appointment to see her eye doctor. She has not yet had a screening colonoscopy.  Objective: Temp: 97.6 F (36.4 C) (07/23 0844) Temp src: Oral (07/23 0844) BP: 137/76 mmHg (07/23 0844) Pulse Rate: 81  (07/23 0844)  General: Her body mass index remains over 50 Skin: No rash Eyes: Conjunctivae are normal. Pupils are round  and normally reactive to light. Lungs: Clear Cor: Regular S1 and S2 no murmurs Abdomen: Obese, soft and nontender  Lab Results HIV 1 RNA Quant (copies/mL)  Date Value  05/29/2011 <20   10/31/2010 <20   05/05/2010 <20 copies/mL      CD4 T Cell Abs (cmm)  Date Value  05/29/2011 780   10/31/2010 850   12/23/2009 820      Assessment: Her HIV infection remains under excellent control. I will continue her current antiretroviral regimen. I've encouraged her to continue lifestyle modification to try to maintain her blood pressure control and work on weight loss and improvement in her lipid panel. Her total cholesterol, LDL cholesterol and triglycerides have improved over the last 3 months. I suggested that she call right away to set up an appointment to see her eye doctor.  Plan: 1. Continue current antiretroviral regimen 2. Ophthalmology evaluation 3. Continue lifestyle modification 4. We have shared with her a list of primary care physicians in our network encouraged her to establish primary care   Cliffton Asters, MD [facility] for Infectious Disease [facility] [phone] pager   [phone] cell 09/12/2011, 9:01 AM

## 2011-09-25 ENCOUNTER — Other Ambulatory Visit: Payer: Self-pay | Admitting: Internal Medicine

## 2011-10-02 ENCOUNTER — Ambulatory Visit (INDEPENDENT_AMBULATORY_CARE_PROVIDER_SITE_OTHER): Payer: 59 | Admitting: Dietician

## 2011-10-02 VITALS — Ht 61.0 in | Wt 288.5 lb

## 2011-10-02 DIAGNOSIS — E669 Obesity, unspecified: Secondary | ICD-10-CM

## 2011-10-02 DIAGNOSIS — E785 Hyperlipidemia, unspecified: Secondary | ICD-10-CM

## 2011-10-02 NOTE — Progress Notes (Signed)
Medical Nutrition Therapy:  Appt start time: 1530 end time:  1615  Assessment:  Primary concerns today: Weight management.  BMI 54.6 weight decreased 3.5#.  Estimated calories intake 1600-1700 calories and ~ 60 grams fat per day. Getting 2000-8000 steps/day. ~ 5000-8000 on weekdays, 2000- 4000 on weekends. Eating ~ 2 servings of fruit and vegetables per day. Labs show LDL trending down now 126, total chol 209 and triglycerides 206  Progress Towards Goal(s):  In progress.   Nutritional Diagnosis:   NB-1.4 Self-monitoring deficit  As related to lack of previous exposure to alternative methods of self monitoring resolved As evidenced by patient food records and step records and report that she is weighing at home.   [place] 2.2 Altered nutrient related laboratory values as related to increased total cholesterol and LDL as evidenced by patient food recall of total fat and report of types on fat ingested.     Intervention:   1- Nutrition education on trans fats, problem solving, reasons for increased fruit and vegetable consumption. 2- Nutrition counseling on positive self talk, mindful eating.  3- Social support addressed. Food records reviewed with patient.    Monitoring/Evaluation:  Dietary intake, exercise steps, and body weight in 4-6 week(s).

## 2011-10-02 NOTE — Patient Instructions (Addendum)
Please keep food records and step records as you have been.  You need about 1600 - 1700  Calories and 60 grams fat each day to continue writing loss.   Have fun working on problem solving plan.   We'll do positive self talk at your next visit.   Please make a follow up in 4-6 weeks

## 2011-11-20 ENCOUNTER — Ambulatory Visit (INDEPENDENT_AMBULATORY_CARE_PROVIDER_SITE_OTHER): Payer: 59 | Admitting: Dietician

## 2011-11-20 ENCOUNTER — Encounter: Payer: Self-pay | Admitting: Dietician

## 2011-11-20 DIAGNOSIS — E669 Obesity, unspecified: Secondary | ICD-10-CM

## 2011-11-20 NOTE — Progress Notes (Signed)
Medical Nutrition Therapy:  Appt start time: 1530 end time:  1609  Assessment:  Primary concerns today: Weight management.  BMI 54.3 weight decreased 2.0# in 6 weeks. Stopping keeping food records- reports she eats the same things most days. Weighing less often, keeping track of steps some. Going to zumba.  Thinking about making a list of calories and fat grams in favorite fast foods to help her make better choices and to work them in her daily budget.   Progress Towards Goal(s):  In progress.   Nutritional Diagnosis:   NB-1.4 Self-monitoring deficit  As related to lack of previous exposure to alternative methods of self monitoring resolved As evidenced by patient food records and step records and report that she is weighing at home.   [place] 2.2 Altered nutrient related laboratory values as related to increased total cholesterol and LDL as evidenced by patient food recall of total fat and report of types on fat ingested.     Intervention:   1- Nutrition education: Tipping the calorie balance and handling slips. 2- Nutrition counseling on positive self talk, mindful eating.  3- Motivational Interviewing: importance rated a "5" , self confidence a 3. The need to lose weight vs  want to lose weight and sustaining weight loss are important to issues for patient    Monitoring/Evaluation:  Dietary intake, exercise steps, and body weight in 4 week(s).

## 2011-11-20 NOTE — Patient Instructions (Addendum)
Please read both handouts and complete the one of your choice.   Great job: 2 # down and eating veggie and keeping track of steps..  Self monitoring and self support are important to sustaining weight loss.   Feeling are not facts.  Self monitoring tool should be a fact- weight, steps, labs, food record, ???  Let's make a follow up in 4 weeks.

## 2011-12-18 ENCOUNTER — Ambulatory Visit (INDEPENDENT_AMBULATORY_CARE_PROVIDER_SITE_OTHER): Payer: 59 | Admitting: Dietician

## 2011-12-18 VITALS — Ht 61.0 in | Wt 289.5 lb

## 2011-12-18 DIAGNOSIS — E669 Obesity, unspecified: Secondary | ICD-10-CM

## 2011-12-18 DIAGNOSIS — E785 Hyperlipidemia, unspecified: Secondary | ICD-10-CM

## 2011-12-18 NOTE — Patient Instructions (Addendum)
Please keep food records for 3 days of 2 weeks using the food record instructions. Also total calories and fat grams for those days.  Your budget is 1600- 1800 calories each day and less than 60 grams fat.    You said your goal is to limit your meals at fast food to 1 time each week: "fast food fridays"  Consider buying and keeping at home easy to prepare foods- salmon in foil, tuna, cottage cheese yogurt, salad you can our from the bag, dried fruit.   Could consider counseling for the emotions involved in eating to assist with your weight loss efforfs. May want to call insurance about that and also exercise opportunities.  Let's follow up in 4-5 weeks.

## 2011-12-19 NOTE — Progress Notes (Signed)
Medical Nutrition Therapy:  Appt start time: 1530 end time:  1645 Visit # 5  Assessment:  Primary concerns today: Weight management.  BMI 54.3 weight with no change in past 4 weeks. Stopped keeping food records, back to eating fast food several times a weeks. Began making list of fast foods she eats and options to assist ain her planning. Weighing less often, keeping track of steps some. Going to zumba.  Only gets in > 8000 steps on Zumba days. Reports emotional eating and feels she has support in making healthy behavior changes.   Progress Towards Goal(s):  In progress.   Nutritional Diagnosis:   NB-1.4 Self-monitoring deficit  As related to lack of previous exposure to alternative methods of self monitoring resolved As evidenced by patient food records and step records and report that she is weighing at home.   [place] 2.2 Altered nutrient related laboratory values as related to increased total cholesterol and LDL as evidenced by patient food recall of total fat and report of types on fat ingested.     Intervention:   1- Nutrition education: Handling slips. 2- Nutrition counseling on positive self talk, self monitoring.  3- Coordination of care- asked patient to consider counseling in addition to MNT for assistance with emotional eating.    Monitoring/Evaluation:  Dietary intake, exercise steps, and body weight in 4 week(s).

## 2011-12-24 ENCOUNTER — Other Ambulatory Visit: Payer: Self-pay | Admitting: Internal Medicine

## 2012-01-12 ENCOUNTER — Ambulatory Visit (INDEPENDENT_AMBULATORY_CARE_PROVIDER_SITE_OTHER): Payer: 59 | Admitting: *Deleted

## 2012-01-12 DIAGNOSIS — Z124 Encounter for screening for malignant neoplasm of cervix: Secondary | ICD-10-CM

## 2012-01-12 NOTE — Progress Notes (Signed)
  Subjective:     Holly Stephens is a 56 y.o. woman who comes in today for a  pap smear only.   Objective:    There were no vitals taken for this visit. Pelvic Exam: Pap smear obtained.   Assessment:    Screening pap smear.   Plan:    Follow up in one year, or as indicated by Pap results. Pt offered condoms. Pt given educational materials re: BSE, pap smears, and HIV and heart disease.

## 2012-01-17 ENCOUNTER — Telehealth: Payer: Self-pay | Admitting: *Deleted

## 2012-01-17 ENCOUNTER — Encounter: Payer: 59 | Admitting: Dietician

## 2012-01-17 NOTE — Telephone Encounter (Signed)
Results shared with the pt.  Pt verbalized understanding.

## 2012-02-02 ENCOUNTER — Ambulatory Visit (INDEPENDENT_AMBULATORY_CARE_PROVIDER_SITE_OTHER): Payer: 59 | Admitting: Dietician

## 2012-02-02 VITALS — Ht 61.0 in | Wt 299.1 lb

## 2012-02-02 DIAGNOSIS — E669 Obesity, unspecified: Secondary | ICD-10-CM

## 2012-02-02 NOTE — Progress Notes (Signed)
Medical Nutrition Therapy:  Appt start time: 933 end time:  1034 Visit # 6  Assessment:  Primary concerns today: Weight management.  BMI 56.6 # weight increased past 6 weeks. Not keeping food records,  eating fast food several times a week but is ordering healthier foods more often. Going to zumba, but has pain in left hip, is thinking about different activity.  Not interested in bariatric surgery at this time. Reports lack of sleep may be barrier to activity and planning meals better.    Progress Towards Goal(s):  In progress.   Nutritional Diagnosis:   NB-1.4 Self-monitoring deficit  As related to lack of previous exposure to alternative methods of self monitoring as evidenced by no records reportedly kept or brought to visit.   [place] 2.2 Altered nutrient related laboratory values as related to increased total cholesterol and LDL as evidenced by patient food recall of total fat and report of types on fat ingested. Vitamiin D pending    Intervention:   1- Nutrition education: vitamin D- importance in diet and food content, Problem solving and sleep hygiene. 2- Nutrition counseling on pros and cons of bariatric surgery & self monitoring progress.     Monitoring/Evaluation:  Sleep progress, Dietary intake, exercise steps, and body weight in 4 week(s).

## 2012-02-02 NOTE — Patient Instructions (Addendum)
Vitamin D- check labels of yogurt and supplement content   Sleep hygiene- sounds like your body wants about 8 hours of sleep each night.   1- Have to go to sleep by 9 to get that during the week.  2- turn TV off during sleep hours  3- drink decaf coffee at night - can flavor milk with instant decaf coffee  Make a follow up in 6- 8 weeks

## 2012-03-04 ENCOUNTER — Other Ambulatory Visit: Payer: Self-pay | Admitting: *Deleted

## 2012-03-04 DIAGNOSIS — B20 Human immunodeficiency virus [HIV] disease: Secondary | ICD-10-CM

## 2012-03-04 MED ORDER — RITONAVIR 100 MG PO TABS: 100.0000 mg | ORAL_TABLET | Freq: Every day | ORAL | Status: DC

## 2012-03-04 NOTE — Telephone Encounter (Signed)
Patient Holly Stephens was changed to 800 mg daily from 400 mg twice a day but her Norvir was still twice a day. Pharmacy called and asked if we ment to change it as well. Gave the verbal to change the Norvir to once daily #90 with 3 refills.

## 2012-03-07 ENCOUNTER — Other Ambulatory Visit: Payer: Self-pay | Admitting: Licensed Clinical Social Worker

## 2012-03-07 ENCOUNTER — Ambulatory Visit (INDEPENDENT_AMBULATORY_CARE_PROVIDER_SITE_OTHER): Payer: 59 | Admitting: Dietician

## 2012-03-07 ENCOUNTER — Other Ambulatory Visit: Payer: Self-pay | Admitting: *Deleted

## 2012-03-07 ENCOUNTER — Other Ambulatory Visit: Payer: 59

## 2012-03-07 ENCOUNTER — Telehealth: Payer: Self-pay | Admitting: *Deleted

## 2012-03-07 DIAGNOSIS — E669 Obesity, unspecified: Secondary | ICD-10-CM

## 2012-03-07 DIAGNOSIS — E785 Hyperlipidemia, unspecified: Secondary | ICD-10-CM

## 2012-03-07 DIAGNOSIS — B20 Human immunodeficiency virus [HIV] disease: Secondary | ICD-10-CM

## 2012-03-07 DIAGNOSIS — IMO0002 Reserved for concepts with insufficient information to code with codable children: Secondary | ICD-10-CM

## 2012-03-07 LAB — LIPID PANEL
HDL: 44 mg/dL (ref >39)
LDL Cholesterol: 145 mg/dL — ABNORMAL HIGH (ref 0–99)

## 2012-03-07 LAB — CBC
MCHC: 35.1 g/dL (ref 30.0–36.0)
MCV: 109.5 fL — ABNORMAL HIGH (ref 78.0–100.0)
Platelets: 302 10*3/uL (ref 150–400)
RDW: 13.7 % (ref 11.5–15.5)
WBC: 7.4 10*3/uL (ref 4.0–10.5)

## 2012-03-07 LAB — COMPREHENSIVE METABOLIC PANEL
ALT: 36 U/L — ABNORMAL HIGH (ref 0–35)
AST: 31 U/L (ref 0–37)
CO2: 28 mEq/L (ref 19–32)
Chloride: 103 mEq/L (ref 96–112)
Sodium: 144 mEq/L (ref 135–145)
Total Bilirubin: 0.6 mg/dL (ref 0.3–1.2)
Total Protein: 7.2 g/dL (ref 6.0–8.3)

## 2012-03-07 MED ORDER — FENOFIBRATE 145 MG PO TABS: 145.0000 mg | ORAL_TABLET | Freq: Every day | ORAL | Status: DC

## 2012-03-07 MED ORDER — LAMIVUDINE-ZIDOVUDINE 150-300 MG PO TABS: 1.0000 | ORAL_TABLET | Freq: Two times a day (BID) | ORAL | Status: DC

## 2012-03-07 MED ORDER — DARUNAVIR ETHANOLATE 800 MG PO TABS: 800.0000 mg | ORAL_TABLET | Freq: Every day | ORAL | Status: DC

## 2012-03-07 MED ORDER — RITONAVIR 100 MG PO TABS: 100.0000 mg | ORAL_TABLET | Freq: Every day | ORAL | Status: DC

## 2012-03-07 NOTE — Patient Instructions (Addendum)
Doing better at making healthier choices, more veggies, less meat, weighing myself, keeping food records more often.  I am growing more confident that I can do this so I am more often able to resist the status quo.  Want to work more on portions sizes and tracking steps- measuring more often to be sure of how much I am eating.   Motivation x Knowledge     = Change    RESISTANCE  Make a follow up in 6 weeks.

## 2012-03-07 NOTE — Telephone Encounter (Signed)
Patient called with a question on her Norvir, she used to take it twice daily, but it was changed to once daily. Explained that this is because the [name] was changed to once daily and that affected how she takes the Norvir. Wendall Mola CMA

## 2012-03-08 ENCOUNTER — Encounter: Payer: 59 | Admitting: Dietician

## 2012-03-08 LAB — T-HELPER CELL (CD4) - (RCID CLINIC ONLY)
CD4 % Helper T Cell: 28 % — ABNORMAL LOW (ref 33–55)
CD4 T Cell Abs: 820 uL (ref 400–2700)

## 2012-03-08 NOTE — Progress Notes (Signed)
Medical Nutrition Therapy:  Appt start time: 0930 end time:  1030. Follow-up visit:  MNT ( visit #7) Weight decreased ~ 1 # in 4 weeks. BMI 56.5  Assessment:  Primary concerns today: Weight management and Meal planning. Patient did not bring food or activity records. 3 meals during week- 2 meal son weekends,  24-hr recall: B (AM)-cereal & low fat milk ; Snk (AM)-yogurt, veggies ; L (PM)-packs from home- home made tuna salad  ; D-less fast food and making healthier choices. Cooks large amounts on weekends to eat during the week. Sleeping a bit more, going to bed earlier more often,  is drinking decaf drinks in PM.   Recent physical activity includes walking some, some zumba- but still having hip pain. Thinking about doing different activity.   Progress Towards Goal(s):  In progress.   Nutritional Diagnosis:  NB-1.4 Self-monitoring deficit As related to lack of preveious exposure to alternative methods of self monitoring as evidenced by difficulty keeping records for food and activity..  As evidenced by b. [place]-2.2 Altered nutrition-related laboratory As related to increased LDL an dtotal cholesterol.  As evidenced by cholesterol 233 and LDL of 145 both increased with increased food/fat intake and weight gain.     Intervention:  Nutrition education about methods to modify resistance to change, goal setting- setting reasonable goals for herself- for example- keeping food records 5 days a week rather than expecting herself to keep them 7 days a week.   Monitoring/Evaluation: Reevaluate overall progress & effectiveness of MNT at 12 months  Dietary intake, exercise,  and body weight in 4 weeks.

## 2012-03-12 LAB — VITAMIN D 1,25 DIHYDROXY
Vitamin D2 1, 25 (OH)2: <8 pg/mL
Vitamin D3 1, 25 (OH)2: 61 pg/mL

## 2012-03-21 ENCOUNTER — Encounter: Payer: Self-pay | Admitting: Internal Medicine

## 2012-03-21 ENCOUNTER — Ambulatory Visit (INDEPENDENT_AMBULATORY_CARE_PROVIDER_SITE_OTHER): Payer: 59 | Admitting: Internal Medicine

## 2012-03-21 VITALS — BP 159/82 | HR 85 | Temp 98.1°F | Wt 298.5 lb

## 2012-03-21 DIAGNOSIS — B20 Human immunodeficiency virus [HIV] disease: Secondary | ICD-10-CM

## 2012-03-21 NOTE — Progress Notes (Signed)
Patient ID: Holly Stephens, female   DOB: 1956/02/03, 57 y.o.   MRN: [id number]     [facility] for Infectious Disease  Patient Active Problem List  Diagnosis  . HIV DISEASE  . HYPERLIPIDEMIA  . OBESITY NOS  . DEPRESSION  . SYNDROME, CARPAL TUNNEL  . HYPERTENSION  . ADHESIVE CAPSULITIS OF SHOULDER  . ROTATOR CUFF INJURY, RIGHT SHOULDER  . FOOT PAIN  . Episodic low back pain  . Visual floaters    Patient's Medications  New Prescriptions   No medications on file  Previous Medications   DARUNAVIR ETHANOLATE ([name]) 800 MG TABLET    Take 1 tablet (800 mg total) by mouth daily.   FENOFIBRATE (TRICOR) 145 MG TABLET    Take 1 tablet (145 mg total) by mouth daily.   LAMIVUDINE-ZIDOVUDINE (COMBIVIR) 150-300 MG PER TABLET    Take 1 tablet by mouth 2 (two) times daily.   METOPROLOL (LOPRESSOR) 50 MG TABLET    Take 1 tablet (50 mg total) by mouth daily.   MULTIPLE VITAMIN (MULTIVITAMIN) TABLET    Take 1 tablet by mouth daily.     PRAVASTATIN (PRAVACHOL) 40 MG TABLET    TAKE 1 TABLET DAILY   RITONAVIR (NORVIR) 100 MG TABS    Take 1 tablet (100 mg total) by mouth daily with breakfast.  Modified Medications   No medications on file  Discontinued Medications   No medications on file    Subjective: Holly Stephens is in for her routine visit. She been bothered by a bad head cold for the past 10 days. She has not had any fever but is still having a lot of problem with sinus congestion, dry cough and mild shortness of breath.  She was out of her Norvir for 3 days when her pharmacy was late sending it but continued to take her Combivir and [name].  She still very much wanting to lose weight but states that she has not been very good at sticking with the food selections that her nutritionist has recommended.  Objective: Temp: 98.1 F (36.7 C) (01/30 1510) Temp src: Oral (01/30 1510) BP: 159/82 mmHg (01/30 1510) Pulse Rate: 85  (01/30 1510)  General: she weighs 298 pounds. Has marked  central adiposity with lipoatrophyof her face and extremities Nose: Nasal mucosa is red and edematous Oral: No oropharyngeal lesions Skin: no rash Lungs: clear Cor: regular S1 and S2 no murmurs Abdomen: morbid obesity, soft and nontender  Lab Results HIV 1 RNA Quant (copies/mL)  Date Value  03/07/2012 47*  05/29/2011 <20   10/31/2010 <20      CD4 T Cell Abs (cmm)  Date Value  03/07/2012 820   05/29/2011 780   10/31/2010 850      Lab Results  Component Value Date   CHOL 233* 03/07/2012   HDL 44 03/07/2012   LDLCALC 145* 03/07/2012   TRIG 222* 03/07/2012   CHOLHDL 5.3 03/07/2012    Assessment: Her HIV infection is under good control. I will continue her current regimen.  Her morbid obesity and dyslipidemia remain poorly controlled. I encouraged her to continue her work on lifestyle modification. She is also planning on establishing primary care with Dr. Lilyan Punt in the near future.  She has a lingering upper respiratory infection is probably viral in nature. I suggested that she had Afrin nasal spray for symptomatic relief over the next 3-5 days.  Plan: 1. Continue current antiretroviral therapy 2. Discuss lipid control and Dr. Gerda Diss 3. Afrin nasal spray 4.  Follow up here after lab work in 6 months   Cliffton Asters, MD [facility] for Infectious Disease [facility] [phone] pager   [phone] cell 03/21/2012, 3:52 PM

## 2012-04-02 ENCOUNTER — Telehealth: Payer: Self-pay | Admitting: *Deleted

## 2012-04-02 NOTE — Telephone Encounter (Signed)
Message left for pt to let her know that Vitamin D levels were normal and to continue with what she is currently doing.

## 2012-04-18 ENCOUNTER — Encounter: Payer: 59 | Admitting: Dietician

## 2012-06-04 ENCOUNTER — Other Ambulatory Visit: Payer: Self-pay | Admitting: Internal Medicine

## 2012-10-24 ENCOUNTER — Other Ambulatory Visit: Payer: 59

## 2012-10-24 DIAGNOSIS — E785 Hyperlipidemia, unspecified: Secondary | ICD-10-CM

## 2012-10-24 DIAGNOSIS — Z79899 Other long term (current) drug therapy: Secondary | ICD-10-CM

## 2012-10-24 DIAGNOSIS — B20 Human immunodeficiency virus [HIV] disease: Secondary | ICD-10-CM

## 2012-10-24 LAB — LIPID PANEL
LDL Cholesterol: 149 mg/dL — ABNORMAL HIGH (ref 0–99)
Total CHOL/HDL Ratio: 5.6 Ratio
VLDL: 49 mg/dL — ABNORMAL HIGH (ref 0–40)

## 2012-10-25 LAB — T-HELPER CELL (CD4) - (RCID CLINIC ONLY)
CD4 % Helper T Cell: 26 % — ABNORMAL LOW (ref 33–55)
CD4 T Cell Abs: 710 /uL (ref 400–2700)

## 2012-11-05 ENCOUNTER — Ambulatory Visit (INDEPENDENT_AMBULATORY_CARE_PROVIDER_SITE_OTHER): Payer: 59 | Admitting: Internal Medicine

## 2012-11-05 ENCOUNTER — Encounter: Payer: Self-pay | Admitting: Internal Medicine

## 2012-11-05 VITALS — BP 175/82 | HR 90 | Temp 98.7°F | Ht 61.0 in | Wt 310.2 lb

## 2012-11-05 DIAGNOSIS — Z23 Encounter for immunization: Secondary | ICD-10-CM

## 2012-11-05 DIAGNOSIS — B20 Human immunodeficiency virus [HIV] disease: Secondary | ICD-10-CM

## 2012-11-05 MED ORDER — DOLUTEGRAVIR SODIUM 50 MG PO TABS: 50.0000 mg | ORAL_TABLET | Freq: Every day | ORAL | Status: DC

## 2012-11-05 MED ORDER — PRAVASTATIN SODIUM 40 MG PO TABS: 20.0000 mg | ORAL_TABLET | Freq: Every day | ORAL | Status: DC

## 2012-11-05 MED ORDER — LAMIVUDINE 300 MG PO TABS: 300.0000 mg | ORAL_TABLET | Freq: Every day | ORAL | Status: DC

## 2012-11-05 NOTE — Progress Notes (Signed)
[facility]ist    HPI: Holly Stephens is a 57 y.o. female here for potential regimen simplification. She also stopped taking her pravastatin and fenofibrate due to leg cramps and pain.  Allergies: Allergies  Allergen Reactions  . Amoxicillin-Pot Clavulanate     Vitals: Temp: 98.7 F (37.1 C) (09/16 0921) Temp src: Oral (09/16 0921) BP: 175/82 mmHg (09/16 0921) Pulse Rate: 90 (09/16 0921)  Past Medical History: No past medical history on file.  Social History: History   Social History  . Marital Status: Divorced    Spouse Name: N/A    Number of Children: N/A  . Years of Education: N/A   Social History Main Topics  . Smoking status: Never Smoker   . Smokeless tobacco: Never Used  . Alcohol Use: No  . Drug Use: No  . Sexual Activity: No     Comment: declined condoms   Other Topics Concern  . None   Social History Narrative  . 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   Interpretation of Genotype Data per [employer] HIV Database Nucleoside RTIs  lamivudine (3TC) Susceptible abacavir (ABC) Low-level resistance zidovudine (AZT) Intermediate resistance stavudine (D4T) Intermediate resistance didanosine (DDI) Low-level resistance emtricitabine (FTC) Susceptible tenofovir (TDF) Low-level resistance   Non-Nucleoside RTIs  efavirenz (EFV) Susceptible etravirine  (ETR) Susceptible nevirapine (NVP) Susceptible rilpivirine (RPV) Susceptible   Protease Inhibitors  atazanavir/r (ATV/r) Susceptible darunavir/r (DRV/r) Susceptible fosamprenavir/r (FPV/r) Susceptible indinavir/r (IDV/r) Susceptible lopinavir/r (LPV/r) Susceptible nelfinavir (NFV) Susceptible saquinavir/r (SQV/r) Susceptible tipranavir/r (TPV/r) Susceptible    CrCl: Estimated Creatinine Clearance: 104.1 ml/min (by C-G formula based on Cr of 0.57).  Lipids:    Component Value Date/Time   CHOL 241* 10/24/2012 0908   TRIG 246* 10/24/2012 0908   HDL 43 10/24/2012 0908   CHOLHDL 5.6 10/24/2012 0908   VLDL 49* 10/24/2012 0908   LDLCALC 149* 10/24/2012 0908    Assessment: HIV - she is a candidate for simplification to a once-daily regimen.  She has some intermediate-resistance to AZT but maintains susceptibility to 3TC.  She has never been on an integrase inhibitor that she can recall.  Hyperlipidemia - we discussed her leg pain and cramps.  She did find that it improved when she stopped her pravastatin and fenofibrate.  I discussed with her that her fenofibrate was likely not the cause of the pain.  We also discussed pravastatin, and that it could potentially cause these effects, but is one of the least-likely offenders in the statin class.  Recommendations: Discussed with Dr. Orvan Falconer - will change to Darunavir/ritonavir, Lamivudine, and Dolutegravir. She agreed to restart her fenofibrate, and would like to try a lower dose of pravastatin.  We will use 20mg  daily as she just refilled her 40mg  prescription. She will call  if she experiences more leg pain when restarting the pravastatin.  Sallee Provencal, Pharm.D., BCPS, AAHIVP Clinical Infectious Disease Pharmacist [facility] 11/05/2012, 12:38 PM

## 2012-11-05 NOTE — Progress Notes (Signed)
Patient ID: Holly Stephens, female   DOB: [birth year], 57 y.o.   MRN: [id number]          [facility] for Infectious Disease  Patient Active Problem List   Diagnosis Date Noted  . Visual floaters 09/12/2011  . Episodic low back pain 11/15/2010  . ROTATOR CUFF INJURY, RIGHT SHOULDER 11/03/2008  . ADHESIVE CAPSULITIS OF SHOULDER 11/12/2007  . OBESITY NOS 05/17/2006  . SYNDROME, CARPAL TUNNEL 05/17/2006  . FOOT PAIN 05/17/2006  . HIV DISEASE 02/23/2006  . HYPERLIPIDEMIA 02/23/2006  . DEPRESSION 02/23/2006  . HYPERTENSION 02/23/2006    Patient's Medications  New Prescriptions   DOLUTEGRAVIR (TIVICAY) 50 MG TABLET    Take 1 tablet (50 mg total) by mouth daily.   LAMIVUDINE (EPIVIR) 300 MG TABLET    Take 1 tablet (300 mg total) by mouth daily.  Previous Medications   DARUNAVIR ETHANOLATE ([name]) 800 MG TABLET    Take 1 tablet (800 mg total) by mouth daily.   FENOFIBRATE (TRICOR) 145 MG TABLET    Take 1 tablet (145 mg total) by mouth daily.   METOPROLOL (LOPRESSOR) 50 MG TABLET    TAKE 1 TABLET DAILY   MULTIPLE VITAMIN (MULTIVITAMIN) TABLET    Take 1 tablet by mouth daily.     RITONAVIR (NORVIR) 100 MG TABS    Take 1 tablet (100 mg total) by mouth daily with breakfast.  Modified Medications   Modified Medication Previous Medication   PRAVASTATIN (PRAVACHOL) 40 MG TABLET pravastatin (PRAVACHOL) 40 MG tablet      Take 0.5 tablets (20 mg total) by mouth daily.    TAKE 1 TABLET DAILY  Discontinued Medications   LAMIVUDINE-ZIDOVUDINE (COMBIVIR) 150-300 MG PER TABLET    Take 1 tablet by mouth 2 (two) times daily.    Subjective: Holly Stephens is in for her routine visit. She recently started having some pain in her legs and noticed the package insert a Pravachol mentioned similar side effects of she stopped taking Pravachol and fenofibrate. The pain is much better now. She has not had any problems getting her HIV medicines. She does recall missing her nighttime Combivir on several  occasions since her last visit. Review of Systems: Pertinent items are noted in HPI.  No past medical history on file.  History  Substance Use Topics  . Smoking status: Never Smoker   . Smokeless tobacco: Never Used  . Alcohol Use: No    No family history on file.  Allergies  Allergen Reactions  . Amoxicillin-Pot Clavulanate     Objective: Temp: 98.7 F (37.1 C) (09/16 0921) Temp src: Oral (09/16 0921) BP: 175/82 mmHg (09/16 0921) Pulse Rate: 90 (09/16 0921)  General: A body mass index remains 56.43 Oral: No oropharyngeal lesions Skin: No rash Lungs: Clear Cor: Regular S1 and S2 no murmurs  Lab Results HIV 1 RNA Quant (copies/mL)  Date Value  10/24/2012 <20   03/07/2012 47*  05/29/2011 <20      CD4 T Cell Abs (/uL)  Date Value  10/24/2012 710   03/07/2012 820   05/29/2011 780      Lab Results  Component Value Date   CHOL 241* 10/24/2012   HDL 43 10/24/2012   LDLCALC 485* 10/24/2012   TRIG 246* 10/24/2012   CHOLHDL 5.6 10/24/2012   Assessment: Her HIV remains under good control but upon review of her past regimens and genotypes this is a suboptimal regimen providing only 2 active agents. I reviewed the options with our pharmacist, Estella Husk,  and we'll change her to a once daily regimen of Epivir, Tivicay and boosted [name].  She may have developed muscle inflammation related to her Pravachol. She is amenable to restarting half the dose along with restarting fenofibrate.  Plan: 1. Change antiretroviral regimen to once daily Epivir, Tivicay, [name] and Norvir 2. Restart Pravachol 20 mg daily along with fenofibrate 3. Influenza vaccination 4. Followup after lab work in 6 months   Cliffton Asters, MD [facility] for Infectious Disease [facility] [phone] pager   [phone] cell 11/05/2012, 10:03 AM

## 2012-11-05 NOTE — Addendum Note (Signed)
Addended by: Jennet Maduro D on: 11/05/2012 10:12 AM   Modules accepted: Orders

## 2012-11-06 ENCOUNTER — Other Ambulatory Visit: Payer: Self-pay | Admitting: Licensed Clinical Social Worker

## 2012-11-06 DIAGNOSIS — E785 Hyperlipidemia, unspecified: Secondary | ICD-10-CM

## 2012-11-06 MED ORDER — PRAVASTATIN SODIUM 40 MG PO TABS: 20.0000 mg | ORAL_TABLET | Freq: Every day | ORAL | Status: DC

## 2012-11-08 ENCOUNTER — Other Ambulatory Visit: Payer: Self-pay

## 2013-01-19 ENCOUNTER — Encounter: Payer: Self-pay | Admitting: Internal Medicine

## 2013-01-20 ENCOUNTER — Other Ambulatory Visit: Payer: Self-pay | Admitting: *Deleted

## 2013-01-20 DIAGNOSIS — B20 Human immunodeficiency virus [HIV] disease: Secondary | ICD-10-CM

## 2013-01-20 MED ORDER — RITONAVIR 100 MG PO TABS: 100.0000 mg | ORAL_TABLET | Freq: Every day | ORAL | Status: DC

## 2013-01-20 MED ORDER — DARUNAVIR ETHANOLATE 800 MG PO TABS: 800.0000 mg | ORAL_TABLET | Freq: Every day | ORAL | Status: DC

## 2013-01-24 ENCOUNTER — Other Ambulatory Visit: Payer: Self-pay | Admitting: Internal Medicine

## 2013-01-24 DIAGNOSIS — B20 Human immunodeficiency virus [HIV] disease: Secondary | ICD-10-CM

## 2013-01-24 MED ORDER — RITONAVIR 100 MG PO TABS: 100.0000 mg | ORAL_TABLET | Freq: Every day | ORAL | Status: DC

## 2013-01-24 NOTE — Telephone Encounter (Signed)
I will refill Norvir. Please ask Tattianna to retry fenofibrate alone. Pravastatin is the likely cause of her symptoms.

## 2013-03-14 ENCOUNTER — Emergency Department (INDEPENDENT_AMBULATORY_CARE_PROVIDER_SITE_OTHER)
Admission: EM | Admit: 2013-03-14 | Discharge: 2013-03-14 | Disposition: A | Discharge status: Home / Self Care | Payer: 59 | Source: Home / Self Care | Attending: Family Medicine | Admitting: Family Medicine

## 2013-03-14 ENCOUNTER — Telehealth: Payer: Self-pay | Admitting: *Deleted

## 2013-03-14 ENCOUNTER — Encounter (HOSPITAL_COMMUNITY): Payer: Self-pay | Admitting: Emergency Medicine

## 2013-03-14 DIAGNOSIS — H6690 Otitis media, unspecified, unspecified ear: Secondary | ICD-10-CM

## 2013-03-14 HISTORY — DX: Human immunodeficiency virus (HIV) disease: B20

## 2013-03-14 MED ORDER — METHYLPREDNISOLONE (PAK) 4 MG PO TABS: ORAL_TABLET | ORAL | Status: DC

## 2013-03-14 MED ORDER — CEFDINIR 300 MG PO CAPS: 300.0000 mg | ORAL_CAPSULE | Freq: Two times a day (BID) | ORAL | Status: DC

## 2013-03-14 MED ORDER — CHLORPHENIRAMINE-PSE-IBUPROFEN 2-30-200 MG PO TABS: ORAL_TABLET | ORAL | Status: DC

## 2013-03-14 MED ORDER — FLUTICASONE PROPIONATE 50 MCG/ACT NA SUSP: 2.0000 | Freq: Two times a day (BID) | NASAL | Status: DC

## 2013-03-14 NOTE — Discharge Instructions (Signed)
Antibiotic Nonuse  Your caregiver felt that the infection or problem was not one that would be helped with an antibiotic. Infections may be caused by viruses or bacteria. Only a caregiver can tell which one of these is the likely cause of an illness. A cold is the most common cause of infection in both adults and children. A cold is a virus. Antibiotic treatment will have no effect on a viral infection. Viruses can lead to many lost days of work caring for sick children and many missed days of school. Children may catch as many as 10 "colds" or "flus" per year during which they can be tearful, cranky, and uncomfortable. The goal of treating a virus is aimed at keeping the ill person comfortable. Antibiotics are medications used to help the body fight bacterial infections. There are relatively few types of bacteria that cause infections but there are hundreds of viruses. While both viruses and bacteria cause infection they are very different types of germs. A viral infection will typically go away by itself within 7 to 10 days. Bacterial infections may spread or get worse without antibiotic treatment. Examples of bacterial infections are:  Sore throats (like strep throat or tonsillitis).  Infection in the lung (pneumonia).  Ear and skin infections. Examples of viral infections are:  Colds or flus.  Most coughs and bronchitis.  Sore throats not caused by Strep.  Runny noses. It is often best not to take an antibiotic when a viral infection is the cause of the problem. Antibiotics can kill off the helpful bacteria that we have inside our body and allow harmful bacteria to start growing. Antibiotics can cause side effects such as allergies, nausea, and diarrhea without helping to improve the symptoms of the viral infection. Additionally, repeated uses of antibiotics can cause bacteria inside of our body to become resistant. That resistance can be passed onto harmful bacterial. The next time you have  an infection it may be harder to treat if antibiotics are used when they are not needed. Not treating with antibiotics allows our own immune system to develop and take care of infections more efficiently. Also, antibiotics will work better for [place] when they are prescribed for bacterial infections. Treatments for a child that is ill may include:  Give extra fluids throughout the day to stay hydrated.  Get plenty of rest.  Only give your child over-the-counter or prescription medicines for pain, discomfort, or fever as directed by your caregiver.  The use of a cool mist humidifier may help stuffy noses.  Cold medications if suggested by your caregiver. Your caregiver may decide to start you on an antibiotic if:  The problem you were seen for today continues for a longer length of time than expected.  You develop a secondary bacterial infection. SEEK MEDICAL CARE IF:  Fever lasts longer than 5 days.  Symptoms continue to get worse after 5 to 7 days or become severe.  Difficulty in breathing develops.  Signs of dehydration develop (poor drinking, rare urinating, dark colored urine).  Changes in behavior or worsening tiredness (listlessness or lethargy). Document Released: 04/17/2001 Document Revised: 05/01/2011 Document Reviewed: 10/14/2008 [facility] Patient Information 2014 [place], [place].  Otitis Media, Adult Otitis media is redness, soreness, and swelling (inflammation) of the middle ear. Otitis media may be caused by allergies or, most commonly, by infection. Often it occurs as a complication of the common cold. SIGNS AND SYMPTOMS Symptoms of otitis media may include:  Earache.  Fever.  Ringing in your ear.  Headache.  Leakage of fluid from the ear. DIAGNOSIS To diagnose otitis media, your health care provider will examine your ear with an otoscope. This is an instrument that allows your health care provider to see into your ear in order to examine your eardrum. Your  health care provider also will ask you questions about your symptoms. TREATMENT  Typically, otitis media resolves on its own within 3 5 days. Your health care provider may prescribe medicine to ease your symptoms of pain. If otitis media does not resolve within 5 days or is recurrent, your health care provider may prescribe antibiotic medicines if he or she suspects that a bacterial infection is the cause. HOME CARE INSTRUCTIONS   Take your medicine as directed until it is gone, even if you feel better after the first few days.  Only take over-the-counter or prescription medicines for pain, discomfort, or fever as directed by your health care provider.  Follow up with your health care provider as directed. SEEK MEDICAL CARE IF:  You have otitis media only in one ear or bleeding from your nose or both.  You notice a lump on your neck.  You are not getting better in 3 5 days.  You feel worse instead of better. SEEK IMMEDIATE MEDICAL CARE IF:   You have pain that is not controlled with medicine.  You have swelling, redness, or pain around your ear or stiffness in your neck.  You notice that part of your face is paralyzed.  You notice that the bone behind your ear (mastoid) is tender when you touch it. MAKE SURE YOU:   Understand these instructions.  Will watch your condition.  Will get help right away if you are not doing well or get worse. Document Released: 11/12/2003 Document Revised: 11/27/2012 Document Reviewed: 09/03/2012 [facility] Patient Information 2014 [place], [place].

## 2013-03-14 NOTE — ED Notes (Signed)
Waiting discharge papers 

## 2013-03-14 NOTE — Telephone Encounter (Signed)
Patient called c/o sinus pain and pressure and ears feeling "stopped up." This started with a fever earlier in the week; the fever has now subsided. Advise patient to go to Urgent Care to be seen as no MD available today. Patient agreed to this. Myrtis Hopping

## 2013-03-14 NOTE — ED Notes (Signed)
Pt triaged and assessed by provider.   Provider in before nurse. 

## 2013-03-14 NOTE — ED Provider Notes (Signed)
CSN: [id number]     Arrival date & time 03/14/13  1345 History   First MD Initiated Contact with Patient 03/14/13 1446     Chief Complaint  Patient presents with  . Otalgia   (Consider location/radiation/quality/duration/timing/severity/associated sxs/prior Treatment) HPI Comments: 58 year old female with history of HIV presents complaining of right ear pain. For the past week, she has had some cold symptoms of congestion, cough, mild sore throat. These have resolved. Starting 2 days ago, she started to have a sensation of fullness in her right ear. She has intermittent mild pains as well. All of her other symptoms are still completely resolved. She does not feel sick at this time. She rates the ear discomfort as mild. She has not taken anything to help her symptoms.  Patient is a 58 y.o. female presenting with ear pain.  Otalgia Associated symptoms: hearing loss (muffled)   Associated symptoms: no abdominal pain, no cough, no fever, no rash and no vomiting     Past Medical History  Diagnosis Date  . HIV infection    Past Surgical History  Procedure Laterality Date  . Shoulder surgery     History reviewed. No pertinent family history. History  Substance Use Topics  . Smoking status: Never Smoker   . Smokeless tobacco: Never Used  . Alcohol Use: No   OB History   Grav Para Term Preterm Abortions TAB SAB Ect Mult Living                 Review of Systems  Constitutional: Negative for fever and chills.  HENT: Positive for ear pain and hearing loss (muffled).   Eyes: Negative for visual disturbance.  Respiratory: Negative for cough and shortness of breath.   Cardiovascular: Negative for chest pain, palpitations and leg swelling.  Gastrointestinal: Negative for nausea, vomiting and abdominal pain.  Endocrine: Negative for polydipsia and polyuria.  Genitourinary: Negative for dysuria, urgency and frequency.  Musculoskeletal: Negative for arthralgias and myalgias.  Skin:  Negative for rash.  Neurological: Negative for dizziness, weakness and light-headedness.    Allergies  Amoxicillin-pot clavulanate  Home Medications   Current Outpatient Rx  Name  Route  Sig  Dispense  Refill  . Darunavir Ethanolate ([name]) 800 MG tablet   Oral   Take 1 tablet (800 mg total) by mouth daily.   90 tablet   3   . dolutegravir (TIVICAY) 50 MG tablet   Oral   Take 1 tablet (50 mg total) by mouth daily.   90 tablet   3   . lamivudine (EPIVIR) 300 MG tablet   Oral   Take 1 tablet (300 mg total) by mouth daily.   90 tablet   3   . metoprolol (LOPRESSOR) 50 MG tablet      TAKE 1 TABLET DAILY   90 tablet   2   . ritonavir (NORVIR) 100 MG TABS tablet   Oral   Take 1 tablet (100 mg total) by mouth daily with breakfast.   90 tablet   3   . cefdinir (OMNICEF) 300 MG capsule   Oral   Take 1 capsule (300 mg total) by mouth 2 (two) times daily.   20 capsule   0   . Chlorpheniramine-PSE-Ibuprofen (ADVIL ALLERGY SINUS) 2-30-200 MG TABS      1-2 tabs PO Q4-6 hrs PRN   50 each   1   . EXPIRED: fenofibrate (TRICOR) 145 MG tablet   Oral   Take 1 tablet (145 mg total) by  mouth daily.   90 tablet   3   . fluticasone (FLONASE) 50 MCG/ACT nasal spray   Each Nare   Place 2 sprays into both nostrils 2 (two) times daily.   1 g   2   . methylPREDNIsolone (MEDROL DOSPACK) 4 MG tablet      follow package directions   21 tablet   0   . Multiple Vitamin (MULTIVITAMIN) tablet   Oral   Take 1 tablet by mouth daily.           . pravastatin (PRAVACHOL) 40 MG tablet   Oral   Take 0.5 tablets (20 mg total) by mouth daily.   90 tablet   3    BP 178/68  Pulse 91  Temp(Src) 99.2 F (37.3 C) (Oral)  Resp 20  SpO2 97% Physical Exam  Nursing note and vitals reviewed. Constitutional: She is oriented to person, place, and time. Vital signs are normal. She appears well-developed and well-nourished. No distress.  Marked truncal obesity  HENT:  Head:  Normocephalic and atraumatic.  Right Ear: External ear and ear canal normal. Tympanic membrane is injected and bulging.  Left Ear: Tympanic membrane, external ear and ear canal normal.  Cardiovascular: Normal rate, regular rhythm and normal heart sounds.  Exam reveals no gallop and no friction rub.   No murmur heard. Pulmonary/Chest: Effort normal and breath sounds normal. No respiratory distress. She has no wheezes. She has no rales.  Lymphadenopathy:    She has no cervical adenopathy.  Neurological: She is alert and oriented to person, place, and time. She has normal strength. Coordination normal.  Skin: Skin is warm and dry. No rash noted. She is not diaphoretic.  Psychiatric: She has a normal mood and affect. Judgment normal.    ED Course  Procedures (including critical care time) Labs Review Labs Reviewed - No data to display Imaging Review No results found.    MDM   1. AOM (acute otitis media)    Mild acute otitis media, likely viral at this point. We'll treat symptomatically and give a postdated prescription for antibiotics, she will take this if she has any worsening or she does not improve with the symptomatic treatment. Followup when necessary  Meds ordered this encounter  Medications  . methylPREDNIsolone (MEDROL DOSPACK) 4 MG tablet    Sig: follow package directions    Dispense:  21 tablet    Refill:  0    Order Specific Question:  Supervising Provider    Answer:  Billy Fischer [phone]  . fluticasone (FLONASE) 50 MCG/ACT nasal spray    Sig: Place 2 sprays into both nostrils 2 (two) times daily.    Dispense:  1 g    Refill:  2    Order Specific Question:  Supervising Provider    Answer:  Billy Fischer [phone]  . Chlorpheniramine-PSE-Ibuprofen (ADVIL ALLERGY SINUS) 2-30-200 MG TABS    Sig: 1-2 tabs PO Q4-6 hrs PRN    Dispense:  50 each    Refill:  1    Order Specific Question:  Supervising Provider    Answer:  Billy Fischer [phone]  . cefdinir (OMNICEF) 300  MG capsule    Sig: Take 1 capsule (300 mg total) by mouth 2 (two) times daily.    Dispense:  20 capsule    Refill:  0    Order Specific Question:  Supervising Provider    Answer:  Ihor Gully D [[id number]]     Liam Graham, PA-C  03/14/13 1516 

## 2013-03-16 NOTE — ED Provider Notes (Signed)
Medical screening examination/treatment/procedure(s) were performed by resident physician or non-physician practitioner and as supervising physician I was immediately available for consultation/collaboration.   Pauline Good MD.   Billy Fischer, MD 03/16/13 1146

## 2013-05-06 ENCOUNTER — Other Ambulatory Visit (INDEPENDENT_AMBULATORY_CARE_PROVIDER_SITE_OTHER): Payer: 59

## 2013-05-06 DIAGNOSIS — B20 Human immunodeficiency virus [HIV] disease: Secondary | ICD-10-CM

## 2013-05-06 LAB — LIPID PANEL
CHOL/HDL RATIO: 6.8 ratio
Cholesterol: 259 mg/dL — ABNORMAL HIGH (ref 0–200)
HDL: 38 mg/dL — AB (ref >39)
LDL Cholesterol: 161 mg/dL — ABNORMAL HIGH (ref 0–99)
Triglycerides: 302 mg/dL — ABNORMAL HIGH (ref <150)
VLDL: 60 mg/dL — AB (ref 0–40)

## 2013-05-06 LAB — CBC
HEMATOCRIT: 45.3 % (ref 36.0–46.0)
HEMOGLOBIN: 15.7 g/dL — AB (ref 12.0–15.0)
MCH: 31.7 pg (ref 26.0–34.0)
MCHC: 34.7 g/dL (ref 30.0–36.0)
MCV: 91.3 fL (ref 78.0–100.0)
Platelets: 242 10*3/uL (ref 150–400)
RBC: 4.96 MIL/uL (ref 3.87–5.11)
RDW: 14.1 % (ref 11.5–15.5)
WBC: 8 10*3/uL (ref 4.0–10.5)

## 2013-05-06 LAB — COMPREHENSIVE METABOLIC PANEL
ALK PHOS: 48 U/L (ref 39–117)
ALT: 40 U/L — ABNORMAL HIGH (ref 0–35)
AST: 33 U/L (ref 0–37)
Albumin: 4.1 g/dL (ref 3.5–5.2)
BILIRUBIN TOTAL: 0.5 mg/dL (ref 0.2–1.2)
BUN: 12 mg/dL (ref 6–23)
CO2: 29 mEq/L (ref 19–32)
Calcium: 9.5 mg/dL (ref 8.4–10.5)
Chloride: 103 mEq/L (ref 96–112)
Creat: 0.54 mg/dL (ref 0.50–1.10)
GLUCOSE: 157 mg/dL — AB (ref 70–99)
Potassium: 4.8 mEq/L (ref 3.5–5.3)
SODIUM: 141 meq/L (ref 135–145)
TOTAL PROTEIN: 6.9 g/dL (ref 6.0–8.3)

## 2013-05-07 LAB — T-HELPER CELL (CD4) - (RCID CLINIC ONLY)
CD4 T CELL HELPER: 31 % — AB (ref 33–55)
CD4 T Cell Abs: 750 /uL (ref 400–2700)

## 2013-05-07 LAB — HIV-1 RNA QUANT-NO REFLEX-BLD
HIV 1 RNA Quant: <20 copies/mL (ref <20)
HIV-1 RNA Quant, Log: <1.3 {Log} (ref <1.30)

## 2013-05-20 ENCOUNTER — Ambulatory Visit (INDEPENDENT_AMBULATORY_CARE_PROVIDER_SITE_OTHER): Payer: 59 | Admitting: Internal Medicine

## 2013-05-20 VITALS — BP 143/81 | HR 80 | Temp 98.3°F | Ht 61.0 in | Wt 308.0 lb

## 2013-05-20 DIAGNOSIS — B20 Human immunodeficiency virus [HIV] disease: Secondary | ICD-10-CM

## 2013-05-20 MED ORDER — DARUNAVIR-COBICISTAT 800-150 MG PO TABS: 1.0000 | ORAL_TABLET | Freq: Every day | ORAL | Status: DC

## 2013-05-20 NOTE — Progress Notes (Signed)
Patient ID: Holly Stephens, female   DOB: 1956-02-20, 58 y.o.   MRN: [id number] @LOGODEPT         Patient Active Problem List   Diagnosis Date Noted  . Visual floaters 09/12/2011  . Episodic low back pain 11/15/2010  . ROTATOR CUFF INJURY, RIGHT SHOULDER 11/03/2008  . ADHESIVE CAPSULITIS OF SHOULDER 11/12/2007  . OBESITY NOS 05/17/2006  . SYNDROME, CARPAL TUNNEL 05/17/2006  . FOOT PAIN 05/17/2006  . HIV DISEASE 02/23/2006  . HYPERLIPIDEMIA 02/23/2006  . DEPRESSION 02/23/2006  . HYPERTENSION 02/23/2006    Patient's Medications  New Prescriptions   DARUNAVIR-COBICISTAT (PREZCOBIX) 800-150 MG PER TABLET    Take 1 tablet by mouth daily. Swallow whole. Do NOT crush, break or chew tablets. Take with food.  Previous Medications   DOLUTEGRAVIR (TIVICAY) 50 MG TABLET    Take 1 tablet (50 mg total) by mouth daily.   FENOFIBRATE (TRICOR) 145 MG TABLET    Take 1 tablet (145 mg total) by mouth daily.   LAMIVUDINE (EPIVIR) 300 MG TABLET    Take 1 tablet (300 mg total) by mouth daily.   METOPROLOL (LOPRESSOR) 50 MG TABLET    TAKE 1 TABLET DAILY   MULTIPLE VITAMIN (MULTIVITAMIN) TABLET    Take 1 tablet by mouth daily.     PRAVASTATIN (PRAVACHOL) 40 MG TABLET    Take 0.5 tablets (20 mg total) by mouth daily.  Modified Medications   No medications on file  Discontinued Medications   CEFDINIR (OMNICEF) 300 MG CAPSULE    Take 1 capsule (300 mg total) by mouth 2 (two) times daily.   CHLORPHENIRAMINE-PSE-IBUPROFEN (ADVIL ALLERGY SINUS) 2-30-200 MG TABS    1-2 tabs PO Q4-6 hrs PRN   DARUNAVIR ETHANOLATE ([name]) 800 MG TABLET    Take 1 tablet (800 mg total) by mouth daily.   FLUTICASONE (FLONASE) 50 MCG/ACT NASAL SPRAY    Place 2 sprays into both nostrils 2 (two) times daily.   METHYLPREDNISOLONE (MEDROL DOSPACK) 4 MG TABLET    follow package directions   RITONAVIR (NORVIR) 100 MG TABS TABLET    Take 1 tablet (100 mg total) by mouth daily with breakfast.    Subjective: Holly Stephens is in for her  routine visit. As usual, she never misses any doses of her HIV medications. She has been having some diffuse aching and she was concerned that it might be related to her lipid medications. She stopped taking both TriCor and Pravachol a few months ago. She's not sure that she really felt any better. She restarted the TriCor about one month ago.  Review of Systems: Pertinent items are noted in HPI.  Past Medical History  Diagnosis Date  . HIV infection     History  Substance Use Topics  . Smoking status: Never Smoker   . Smokeless tobacco: Never Used  . Alcohol Use: No    No family history on file.  Allergies  Allergen Reactions  . Amoxicillin-Pot Clavulanate     Objective: Temp: 98.3 F (36.8 C) (03/31 0909) Temp src: Oral (03/31 0909) BP: 143/81 mmHg (03/31 0909) Pulse Rate: 80 (03/31 0909) Body mass index is 58.23 kg/(m^2).  General: Her BMI is 58 Oral: No oropharyngeal lesions Skin: No rash Lungs: Clear Cor: Regular S1 and S2 no murmurs  Lab Results Lab Results  Component Value Date   WBC 8.0 05/06/2013   HGB 15.7* 05/06/2013   HCT 45.3 05/06/2013   MCV 91.3 05/06/2013   PLT 242 05/06/2013    Lab Results  Component Value Date   CREATININE 0.54 05/06/2013   BUN 12 05/06/2013   NA 141 05/06/2013   K 4.8 05/06/2013   CL 103 05/06/2013   CO2 29 05/06/2013    Lab Results  Component Value Date   ALT 40* 05/06/2013   AST 33 05/06/2013   ALKPHOS 48 05/06/2013   BILITOT 0.5 05/06/2013    Lab Results  Component Value Date   CHOL 259* 05/06/2013   HDL 38* 05/06/2013   LDLCALC 161* 05/06/2013   TRIG 302* 05/06/2013   CHOLHDL 6.8 05/06/2013    Lab Results HIV 1 RNA Quant (copies/mL)  Date Value  05/06/2013 <20   10/24/2012 <20   03/07/2012 47*     CD4 T Cell Abs (/uL)  Date Value  05/06/2013 750   10/24/2012 710   03/07/2012 820      Assessment: For HIV infection remains under excellent control.  She spoke with her pharmacist about her lipid medications. I am not  sure that she is actually having side effects of her Pravachol. She is willing to restart it. If she has persistent muscle aches I will bring her in to check CK enzymes.  Plan: 1. I will simplify her antiretroviral regimen to Epivir, Tivicay and Prezcobix 2. Restart Pravachol 3. Followup after blood work in 6 months   Michel Bickers, MD [facility] for [place] [phone] pager   [phone] cell 05/20/2013, 9:41 AM

## 2013-05-21 ENCOUNTER — Other Ambulatory Visit: Payer: Self-pay | Admitting: *Deleted

## 2013-05-21 DIAGNOSIS — B20 Human immunodeficiency virus [HIV] disease: Secondary | ICD-10-CM

## 2013-05-21 MED ORDER — DARUNAVIR-COBICISTAT 800-150 MG PO TABS: 1.0000 | ORAL_TABLET | Freq: Every day | ORAL | Status: DC

## 2013-05-28 ENCOUNTER — Encounter: Payer: Self-pay | Admitting: Internal Medicine

## 2013-05-28 ENCOUNTER — Encounter: Payer: Self-pay | Admitting: *Deleted

## 2013-05-30 ENCOUNTER — Ambulatory Visit (INDEPENDENT_AMBULATORY_CARE_PROVIDER_SITE_OTHER): Payer: 59 | Admitting: *Deleted

## 2013-05-30 DIAGNOSIS — Z124 Encounter for screening for malignant neoplasm of cervix: Secondary | ICD-10-CM

## 2013-05-30 NOTE — Progress Notes (Signed)
  Subjective:     Holly Stephens is a 58 y.o. woman who comes in today for a  pap smear only.  Previous abnormal Pap smears: no. Contraception:  Condoms.  Pt tested at work recently.  A1C = 7.5.    Objective:    There were no vitals taken for this visit. Pelvic Exam: Pap smear obtained.   Assessment:    Screening pap smear.   Plan:    Follow up in one year, or as indicated by Pap results.  Pt given educational materials re: HIV and women, self-esteem, BSE, nutrition and diet management, PAP smears and partner safety. Pt given condoms.

## 2013-05-30 NOTE — Patient Instructions (Signed)
Your results will be ready in about a week.  I will mail them to you.  Thank you for coming to the Center for your care.  Tahani Potier,  RN 

## 2013-06-04 ENCOUNTER — Encounter: Payer: Self-pay | Admitting: *Deleted

## 2013-06-04 ENCOUNTER — Telehealth: Payer: Self-pay | Admitting: *Deleted

## 2013-06-04 NOTE — Telephone Encounter (Signed)
Notified pt that a work-in appt with Dr. [name] was obtained for Thurs., April, 23, 2015 @ 2:30 PM to address hyperlipidemia and recent A1C of 7.5.  Pt will bring A1C information with her to the visit.  OV notes and lab results will be shared from this office.

## 2013-07-04 ENCOUNTER — Other Ambulatory Visit: Payer: Self-pay | Admitting: Infectious Diseases

## 2013-09-10 ENCOUNTER — Encounter: Payer: Self-pay | Admitting: Internal Medicine

## 2013-09-11 ENCOUNTER — Other Ambulatory Visit: Payer: Self-pay | Admitting: *Deleted

## 2013-09-11 DIAGNOSIS — B20 Human immunodeficiency virus [HIV] disease: Secondary | ICD-10-CM

## 2013-09-11 MED ORDER — LAMIVUDINE 300 MG PO TABS: 300.0000 mg | ORAL_TABLET | Freq: Every day | ORAL | Status: DC

## 2013-09-11 MED ORDER — DOLUTEGRAVIR SODIUM 50 MG PO TABS: 50.0000 mg | ORAL_TABLET | Freq: Every day | ORAL | Status: DC

## 2013-11-10 ENCOUNTER — Other Ambulatory Visit: Payer: 59

## 2013-11-10 DIAGNOSIS — B20 Human immunodeficiency virus [HIV] disease: Secondary | ICD-10-CM

## 2013-11-10 DIAGNOSIS — Z79899 Other long term (current) drug therapy: Secondary | ICD-10-CM

## 2013-11-11 LAB — LIPID PANEL
CHOL/HDL RATIO: 3.9 ratio
CHOLESTEROL: 167 mg/dL (ref 0–200)
HDL: 43 mg/dL (ref >39)
LDL Cholesterol: 91 mg/dL (ref 0–99)
TRIGLYCERIDES: 163 mg/dL — AB (ref <150)
VLDL: 33 mg/dL (ref 0–40)

## 2013-11-11 LAB — T-HELPER CELL (CD4) - (RCID CLINIC ONLY)
CD4 T CELL HELPER: 27 % — AB (ref 33–55)
CD4 T Cell Abs: 690 /uL (ref 400–2700)

## 2013-11-11 LAB — COMPREHENSIVE METABOLIC PANEL
ALT: 37 U/L — AB (ref 0–35)
AST: 24 U/L (ref 0–37)
Albumin: 4.5 g/dL (ref 3.5–5.2)
Alkaline Phosphatase: 43 U/L (ref 39–117)
BILIRUBIN TOTAL: 0.4 mg/dL (ref 0.2–1.2)
BUN: 12 mg/dL (ref 6–23)
CO2: 30 mEq/L (ref 19–32)
Calcium: 9.9 mg/dL (ref 8.4–10.5)
Chloride: 103 mEq/L (ref 96–112)
Creat: 0.65 mg/dL (ref 0.50–1.10)
Glucose, Bld: 118 mg/dL — ABNORMAL HIGH (ref 70–99)
Potassium: 4.8 mEq/L (ref 3.5–5.3)
Sodium: 142 mEq/L (ref 135–145)
Total Protein: 7.3 g/dL (ref 6.0–8.3)

## 2013-11-11 LAB — HIV-1 RNA QUANT-NO REFLEX-BLD

## 2013-11-24 ENCOUNTER — Ambulatory Visit: Payer: 59 | Admitting: Internal Medicine

## 2013-11-27 ENCOUNTER — Ambulatory Visit (INDEPENDENT_AMBULATORY_CARE_PROVIDER_SITE_OTHER): Payer: 59 | Admitting: Internal Medicine

## 2013-11-27 ENCOUNTER — Encounter: Payer: Self-pay | Admitting: Internal Medicine

## 2013-11-27 VITALS — BP 147/80 | HR 80 | Temp 98.4°F | Wt 296.2 lb

## 2013-11-27 DIAGNOSIS — B20 Human immunodeficiency virus [HIV] disease: Secondary | ICD-10-CM

## 2013-11-27 NOTE — Progress Notes (Signed)
Patient ID: ROMANITA FAGER, female   DOB: [birth year], 58 y.o.   MRN: [id number]          Patient Active Problem List   Diagnosis Date Noted  . Visual floaters 09/12/2011  . Episodic low back pain 11/15/2010  . ROTATOR CUFF INJURY, RIGHT SHOULDER 11/03/2008  . ADHESIVE CAPSULITIS OF SHOULDER 11/12/2007  . OBESITY NOS 05/17/2006  . SYNDROME, CARPAL TUNNEL 05/17/2006  . FOOT PAIN 05/17/2006  . Human immunodeficiency virus (HIV) disease 02/23/2006  . HYPERLIPIDEMIA 02/23/2006  . DEPRESSION 02/23/2006  . HYPERTENSION 02/23/2006    Patient's Medications  New Prescriptions   No medications on file  Previous Medications   DARUNAVIR-COBICISTAT (PREZCOBIX) 800-150 MG PER TABLET    Take 1 tablet by mouth daily. Swallow whole. Do NOT crush, break or chew tablets. Take with food.   DOLUTEGRAVIR (TIVICAY) 50 MG TABLET    Take 1 tablet (50 mg total) by mouth daily.   FOLIC ACID PO    Take by mouth daily.   LAMIVUDINE (EPIVIR) 300 MG TABLET    Take 1 tablet (300 mg total) by mouth daily.   METOPROLOL (LOPRESSOR) 50 MG TABLET    TAKE 1 TABLET DAILY   MULTIPLE VITAMIN (MULTIVITAMIN) TABLET    Take 1 tablet by mouth daily.     ROSUVASTATIN (CRESTOR) 5 MG TABLET    Take 2.5 mg by mouth daily at 6 PM.  Modified Medications   No medications on file  Discontinued Medications   FENOFIBRATE (TRICOR) 145 MG TABLET    Take 1 tablet (145 mg total) by mouth daily.   PRAVASTATIN (PRAVACHOL) 40 MG TABLET    Take 0.5 tablets (20 mg total) by mouth daily.    Subjective: Sigrid is in for her routine visit. She recalls missing only one dose of her HIV medications since her last visit. She's not having as much aching pain since changing from TriCor and pravastatin to Crestor. She still has occasional aching pain and stiffness in her left ankle and knee and thinks that she may have some arthritis. She takes ibuprofen occasionally with some relief. Review of Systems: Pertinent items are noted in HPI.  Past  Medical History  Diagnosis Date  . HIV infection     History  Substance Use Topics  . Smoking status: Never Smoker   . Smokeless tobacco: Never Used  . Alcohol Use: No    No family history on file.  Allergies  Allergen Reactions  . Amoxicillin-Pot Clavulanate     Objective: Temp: 98.4 F (36.9 C) (10/08 1426) Temp Source: Oral (10/08 1426) BP: 147/80 mmHg (10/08 1426) Pulse Rate: 80 (10/08 1426) Body mass index is 56 kg/(m^2).  General: She is in good spirits as usual Lungs: Clear Cor: Regular S1 and S2 no murmurs Abdomen: Obese, soft and nontender  Lab Results Lab Results  Component Value Date   WBC 8.0 05/06/2013   HGB 15.7* 05/06/2013   HCT 45.3 05/06/2013   MCV 91.3 05/06/2013   PLT 242 05/06/2013    Lab Results  Component Value Date   CREATININE 0.65 11/10/2013   BUN 12 11/10/2013   NA 142 11/10/2013   K 4.8 11/10/2013   CL 103 11/10/2013   CO2 30 11/10/2013    Lab Results  Component Value Date   ALT 37* 11/10/2013   AST 24 11/10/2013   ALKPHOS 43 11/10/2013   BILITOT 0.4 11/10/2013    Lab Results  Component Value Date   CHOL 167 11/10/2013  HDL 43 11/10/2013   LDLCALC 91 11/10/2013   TRIG 163* 11/10/2013   CHOLHDL 3.9 11/10/2013    Lab Results HIV 1 RNA Quant (copies/mL)  Date Value  11/10/2013 <20   05/06/2013 <20   10/24/2012 <20      CD4 T Cell Abs (/uL)  Date Value  11/10/2013 690   05/06/2013 750   10/24/2012 710      Assessment: Her HIV infection remains under excellent control with her current 3 drug regimen. I will check an HLA B 5701 before her next visit to see if she can safely take abacavir. If so I will consider simplifying her to Triumeq plus minus [name].  Plan: 1. Continue current antiretroviral regimen for now 2. Followup after blood work in 6 months   Michel Bickers, MD [facility] for [place] [phone] pager   [phone] cell 11/27/2013, 2:56 PM

## 2013-11-28 MED ORDER — FOLIC ACID 800 MCG PO TABS: 800.0000 ug | ORAL_TABLET | Freq: Every day | ORAL | Status: DC

## 2013-11-28 MED ORDER — OMEGA-3 FISH OIL 1000 MG PO CAPS: 1.0000 | ORAL_CAPSULE | Freq: Every day | ORAL | Status: DC

## 2013-12-08 ENCOUNTER — Other Ambulatory Visit: Payer: Self-pay | Admitting: Infectious Diseases

## 2013-12-12 ENCOUNTER — Encounter: Payer: Self-pay | Admitting: Internal Medicine

## 2013-12-15 ENCOUNTER — Other Ambulatory Visit: Payer: Self-pay

## 2013-12-15 MED ORDER — METOPROLOL TARTRATE 50 MG PO TABS: 50.0000 mg | ORAL_TABLET | Freq: Every day | ORAL | Status: DC

## 2014-02-24 ENCOUNTER — Other Ambulatory Visit: Payer: Self-pay | Admitting: *Deleted

## 2014-02-24 DIAGNOSIS — B20 Human immunodeficiency virus [HIV] disease: Secondary | ICD-10-CM

## 2014-02-24 MED ORDER — DARUNAVIR-COBICISTAT 800-150 MG PO TABS: 1.0000 | ORAL_TABLET | Freq: Every day | ORAL | Status: DC

## 2014-05-29 ENCOUNTER — Other Ambulatory Visit: Payer: 59

## 2014-05-29 DIAGNOSIS — B20 Human immunodeficiency virus [HIV] disease: Secondary | ICD-10-CM

## 2014-05-29 LAB — CBC
HCT: 44.4 % (ref 36.0–46.0)
HEMOGLOBIN: 15.2 g/dL — AB (ref 12.0–15.0)
MCH: 31.2 pg (ref 26.0–34.0)
MCHC: 34.2 g/dL (ref 30.0–36.0)
MCV: 91.2 fL (ref 78.0–100.0)
MPV: 9.4 fL (ref 8.6–12.4)
PLATELETS: 244 10*3/uL (ref 150–400)
RBC: 4.87 MIL/uL (ref 3.87–5.11)
RDW: 13.9 % (ref 11.5–15.5)
WBC: 8 10*3/uL (ref 4.0–10.5)

## 2014-05-29 LAB — COMPREHENSIVE METABOLIC PANEL
ALBUMIN: 4 g/dL (ref 3.5–5.2)
ALT: 55 U/L — AB (ref 0–35)
AST: 35 U/L (ref 0–37)
Alkaline Phosphatase: 41 U/L (ref 39–117)
BUN: 14 mg/dL (ref 6–23)
CO2: 29 meq/L (ref 19–32)
Calcium: 9.6 mg/dL (ref 8.4–10.5)
Chloride: 100 mEq/L (ref 96–112)
Creat: 0.58 mg/dL (ref 0.50–1.10)
GLUCOSE: 113 mg/dL — AB (ref 70–99)
POTASSIUM: 4.7 meq/L (ref 3.5–5.3)
Sodium: 139 mEq/L (ref 135–145)
TOTAL PROTEIN: 7 g/dL (ref 6.0–8.3)
Total Bilirubin: 0.4 mg/dL (ref 0.2–1.2)

## 2014-05-29 LAB — LIPID PANEL
CHOLESTEROL: 228 mg/dL — AB (ref 0–200)
HDL: 40 mg/dL — ABNORMAL LOW (ref >=46)
LDL Cholesterol: 142 mg/dL — ABNORMAL HIGH (ref 0–99)
Total CHOL/HDL Ratio: 5.7 Ratio
Triglycerides: 231 mg/dL — ABNORMAL HIGH (ref <150)
VLDL: 46 mg/dL — ABNORMAL HIGH (ref 0–40)

## 2014-05-29 LAB — T-HELPER CELL (CD4) - (RCID CLINIC ONLY)
CD4 T CELL ABS: 790 /uL (ref 400–2700)
CD4 T CELL HELPER: 29 % — AB (ref 33–55)

## 2014-05-30 LAB — RPR

## 2014-06-01 LAB — HIV-1 RNA QUANT-NO REFLEX-BLD
HIV 1 RNA Quant: <20 copies/mL (ref <20)
HIV-1 RNA Quant, Log: <1.3 {Log} (ref <1.30)

## 2014-06-05 LAB — HLA B*5701: HLA-B 5701 W/RFLX HLA-B HIGH: NEGATIVE

## 2014-06-11 ENCOUNTER — Ambulatory Visit (INDEPENDENT_AMBULATORY_CARE_PROVIDER_SITE_OTHER): Payer: 59 | Admitting: Internal Medicine

## 2014-06-11 ENCOUNTER — Encounter: Payer: Self-pay | Admitting: Internal Medicine

## 2014-06-11 DIAGNOSIS — B20 Human immunodeficiency virus [HIV] disease: Secondary | ICD-10-CM

## 2014-06-11 MED ORDER — DARUNAVIR ETHANOLATE 800 MG PO TABS: 800.0000 mg | ORAL_TABLET | Freq: Every day | ORAL | Status: DC

## 2014-06-11 MED ORDER — ELVITEG-COBIC-EMTRICIT-TENOFAF 150-150-200-10 MG PO TABS: 1.0000 | ORAL_TABLET | Freq: Every day | ORAL | Status: DC

## 2014-06-11 NOTE — Progress Notes (Signed)
Patient ID: Holly Stephens, female   DOB: [birth year], 59 y.o.   MRN: [id number]          Patient Active Problem List   Diagnosis Date Noted  . Human immunodeficiency virus (HIV) disease 02/23/2006    Priority: High  . Visual floaters 09/12/2011  . Episodic low back pain 11/15/2010  . ROTATOR CUFF INJURY, RIGHT SHOULDER 11/03/2008  . ADHESIVE CAPSULITIS OF SHOULDER 11/12/2007  . OBESITY NOS 05/17/2006  . SYNDROME, CARPAL TUNNEL 05/17/2006  . FOOT PAIN 05/17/2006  . HYPERLIPIDEMIA 02/23/2006  . DEPRESSION 02/23/2006  . HYPERTENSION 02/23/2006    Patient's Medications  New Prescriptions   DARUNAVIR ETHANOLATE ([name]) 800 MG TABLET    Take 1 tablet (800 mg total) by mouth daily with breakfast.   ELVITEGRAVIR-COBICISTAT-EMTRICITABINE-TENOFOVIR ([name]) 150-150-200-10 MG TABS TABLET    Take 1 tablet by mouth daily with breakfast.  Previous Medications   FOLIC ACID (FOLVITE) 295 MCG TABLET    Take 1 tablet (800 mcg total) by mouth daily.   METOPROLOL (LOPRESSOR) 50 MG TABLET    Take 1 tablet (50 mg total) by mouth daily.   MULTIPLE VITAMIN (MULTIVITAMIN) TABLET    Take 1 tablet by mouth daily.     OMEGA-3 FISH OIL (MAXEPA) 1000 MG CAPS CAPSULE    Take 1 capsule (1,000 mg total) by mouth daily.   ROSUVASTATIN (CRESTOR) 5 MG TABLET    Take 2.5 mg by mouth daily at 6 PM.  Modified Medications   No medications on file  Discontinued Medications   DARUNAVIR-COBICISTAT (PREZCOBIX) 800-150 MG PER TABLET    Take 1 tablet by mouth daily. Swallow whole. Do NOT crush, break or chew tablets. Take with food.   DOLUTEGRAVIR (TIVICAY) 50 MG TABLET    Take 1 tablet (50 mg total) by mouth daily.   LAMIVUDINE (EPIVIR) 300 MG TABLET    Take 1 tablet (300 mg total) by mouth daily.    Subjective: Kiira is in for her routine HIV follow-up visit. As usual she never misses any doses of her HIV medications. She has had no trouble tolerating them. She does have to pay several $100 each month for her  co-pays.   Review of Systems: Pertinent items are noted in HPI.  Past Medical History  Diagnosis Date  . HIV infection     History  Substance Use Topics  . Smoking status: Never Smoker   . Smokeless tobacco: Never Used  . Alcohol Use: No    No family history on file.  Allergies  Allergen Reactions  . Amoxicillin-Pot Clavulanate     Objective: Temp: 98.2 F (36.8 C) (04/21 0856) Temp Source: Oral (04/21 0856) BP: 132/81 mmHg (04/21 0856) Pulse Rate: 60 (04/21 0856) Body mass index is 56.9 kg/(m^2).  General: She is in good spirits  Lungs: Clear Cor: Regular S1 and S2 no murmurs Abdomen: Obese, soft and nontender  Lab Results Lab Results  Component Value Date   WBC 8.0 05/29/2014   HGB 15.2* 05/29/2014   HCT 44.4 05/29/2014   MCV 91.2 05/29/2014   PLT 244 05/29/2014    Lab Results  Component Value Date   CREATININE 0.58 05/29/2014   BUN 14 05/29/2014   NA 139 05/29/2014   K 4.7 05/29/2014   CL 100 05/29/2014   CO2 29 05/29/2014    Lab Results  Component Value Date   ALT 55* 05/29/2014   AST 35 05/29/2014   ALKPHOS 41 05/29/2014   BILITOT 0.4 05/29/2014  Lab Results  Component Value Date   CHOL 228* 05/29/2014   HDL 40* 05/29/2014   LDLCALC 142* 05/29/2014   TRIG 231* 05/29/2014   CHOLHDL 5.7 05/29/2014    Lab Results HIV 1 RNA QUANT (copies/mL)  Date Value  05/29/2014 <20  11/10/2013 <20  05/06/2013 <20   CD4 T CELL ABS (/uL)  Date Value  05/29/2014 790  11/10/2013 690  05/06/2013 750     Assessment: Her HIV infection remains under excellent control with her current 3 drug regimen. I will simplify the regimen and make it less expensive by changing her to a 2 drug regimen consisting of [name] and [name].  Plan: 1. Change to [name] and [name] once daily with breakfast 2. Followup after blood work in 6 months   Michel Bickers, MD [facility] for [place] [phone] pager   [phone]  cell 06/11/2014, 9:18 AM

## 2014-06-17 ENCOUNTER — Encounter: Payer: Self-pay | Admitting: Podiatrist

## 2014-06-17 ENCOUNTER — Ambulatory Visit (INDEPENDENT_AMBULATORY_CARE_PROVIDER_SITE_OTHER): Payer: 59 | Admitting: Podiatrist

## 2014-06-17 ENCOUNTER — Telehealth: Payer: Self-pay | Admitting: *Deleted

## 2014-06-17 VITALS — BP 134/69 | HR 71 | Resp 14 | Ht 61.0 in | Wt 298.0 lb

## 2014-06-17 DIAGNOSIS — L84 Corns and callosities: Secondary | ICD-10-CM

## 2014-06-17 DIAGNOSIS — M79676 Pain in unspecified toe(s): Secondary | ICD-10-CM | POA: Diagnosis not present

## 2014-06-17 DIAGNOSIS — E119 Type 2 diabetes mellitus without complications: Secondary | ICD-10-CM

## 2014-06-17 DIAGNOSIS — B351 Tinea unguium: Secondary | ICD-10-CM | POA: Diagnosis not present

## 2014-06-17 NOTE — Patient Instructions (Signed)
My favorite brands of shoes are new balance and brooks-- [employer] (life [employer] ([address] across from [employer]) is fantastic in matching you with the correct shoe for your foot type.  Diabetes and Foot Care Diabetes may cause you to have problems because of poor blood supply (circulation) to your feet and legs. This may cause the skin on your feet to become thinner, break easier, and heal more slowly. Your skin may become dry, and the skin may peel and crack. You may also have nerve damage in your legs and feet causing decreased feeling in them. You may not notice minor injuries to your feet that could lead to infections or more serious problems. Taking care of your feet is one of the most important things you can do for yourself.  HOME CARE INSTRUCTIONS  Wear shoes at all times, even in the house. Do not go barefoot. Bare feet are easily injured.  Check your feet daily for blisters, cuts, and redness. If you cannot see the bottom of your feet, use a mirror or ask someone for help.  Wash your feet with warm water (do not use hot water) and mild soap. Then pat your feet and the areas between your toes until they are completely dry. Do not soak your feet as this can dry your skin.  Apply a moisturizing lotion or petroleum jelly (that does not contain alcohol and is unscented) to the skin on your feet and to dry, brittle toenails. Do not apply lotion between your toes.  Trim your toenails straight across. Do not dig under them or around the cuticle. File the edges of your nails with an emery board or nail file.  Do not cut corns or calluses or try to remove them with medicine.  Wear clean socks or stockings every day. Make sure they are not too tight. Do not wear knee-high stockings since they may decrease blood flow to your legs.  Wear shoes that fit properly and have enough cushioning. To break in new shoes, wear them for just a few hours a day. This prevents you from injuring your feet.  Always look in your shoes before you put them on to be sure there are no objects inside.  Do not cross your legs. This may decrease the blood flow to your feet.  If you find a minor scrape, cut, or break in the skin on your feet, keep it and the skin around it clean and dry. These areas may be cleansed with mild soap and water. Do not cleanse the area with peroxide, alcohol, or iodine.  When you remove an adhesive bandage, be sure not to damage the skin around it.  If you have a wound, look at it several times a day to make sure it is healing.  Do not use heating pads or hot water bottles. They may burn your skin. If you have lost feeling in your feet or legs, you may not know it is happening until it is too late.  Make sure your health care provider performs a complete foot exam at least annually or more often if you have foot problems. Report any cuts, sores, or bruises to your health care provider immediately. SEEK MEDICAL CARE IF:   You have an injury that is not healing.  You have cuts or breaks in the skin.  You have an ingrown nail.  You notice redness on your legs or feet.  You feel burning or tingling in your legs or feet.  You have pain or cramps in your legs and feet.  Your legs or feet are numb.  Your feet always feel cold. SEEK IMMEDIATE MEDICAL CARE IF:   There is increasing redness, swelling, or pain in or around a wound.  There is a red line that goes up your leg.  Pus is coming from a wound.  You develop a fever or as directed by your health care provider.  You notice a bad smell coming from an ulcer or wound. Document Released: 02/04/2000 Document Revised: 10/09/2012 Document Reviewed: 07/16/2012 [facility] Patient Information 2015 [place], [place]. This information is not intended to replace advice given to you by your health care provider. Make sure you discuss any questions you have with your health care provider.

## 2014-06-17 NOTE — Telephone Encounter (Signed)
Entered in error

## 2014-06-17 NOTE — Progress Notes (Signed)
   Subjective:    Patient ID: Holly Stephens, female    DOB: 08-30-1955, 59 y.o.   MRN: [id number]  Diabetes   Chief Complaint  Patient presents with  . Callouses    "I have a hard spot on the outside of my foot." left lateral 5th MPJ  . Ingrown Toenail    " I had toenails surger and I think their getting ingrown again." B/L medial 1st toenail borders  . Diabetes    "I found out about a year ago I'm diabetic and I'd like my feet looked after."      Review of Systems  Constitutional: Positive for diaphoresis.  HENT: Positive for congestion and sinus pressure.   Eyes: Positive for itching.  Gastrointestinal: Positive for constipation.  Musculoskeletal: Positive for arthralgias.  All other systems reviewed and are negative.      Objective:   Physical Exam GENERAL APPEARANCE: Alert, conversant. Appropriately groomed. No acute distress.  VASCULAR: Pedal pulses palpable at 2/4 DP and PT bilateral.  Capillary refill time is immediate to all digits,  Proximal to distal cooling it warm to warm.  Digital hair growth is present bilateral  NEUROLOGIC: sensation is intact epicritically and protectively to 5.07 monofilament at 5/5 sites bilateral.  Light touch is intact bilateral, vibratory sensation intact bilateral, achilles tendon reflex is intact bilateral.  MUSCULOSKELETAL: acceptable muscle strength, tone and stability bilateral.  Intrinsic muscluature intact bilateral.  Dominant and plantarflexed fifth metatarsal is noted on the left foot. DERMATOLOGIC: Poro keratotic lesion is present some metatarsal 5 of the left foot. Patient's toenails are significantly thickened, discolored, dystrophic symptomatic and clinically mycotic 10. They're painful and symptomatically and shoe gear and with debridement.     Assessment & Plan:  Symptomatically mycotic toenails, hyperkeratotic lesion submetatarsal 5 of the left foot, diabetes without complication  Thank: Thorough debridement of nails and  hyperkeratotic lesion accomplished today without complication. Recommended continued care of the nails in 3 months and if any concerns arise she will call.

## 2014-06-26 ENCOUNTER — Ambulatory Visit (INDEPENDENT_AMBULATORY_CARE_PROVIDER_SITE_OTHER): Payer: 59 | Admitting: *Deleted

## 2014-06-26 DIAGNOSIS — Z124 Encounter for screening for malignant neoplasm of cervix: Secondary | ICD-10-CM | POA: Diagnosis not present

## 2014-06-26 DIAGNOSIS — Z113 Encounter for screening for infections with a predominantly sexual mode of transmission: Secondary | ICD-10-CM

## 2014-06-26 NOTE — Patient Instructions (Signed)
I will send you your results in MyChart next week.  If there is anything that needs to be treated I will call you.  Thank you for coming to the Center for your care.  Langley Gauss, RN

## 2014-06-26 NOTE — Progress Notes (Addendum)
  Subjective:     Holly Stephens is a 59 y.o. woman who comes in today for a  pap smear only. Previous abnormal Pap smears: no. Contraception: menopausal, condoms.  Objective:    There were no vitals taken for this visit.  Slight vaginal discharge and irritation noted on visual exam. Pelvic Exam:  Pap smear obtained.   Assessment:    Screening pap smear.   Plan:    Follow up in one year, or as indicated by Pap results.  Pt given educational materials re: HIV and women, self-esteem, BSE, nutrition and diet management, PAP smears and partner safety. Pt given condoms.

## 2014-06-29 LAB — CYTOLOGY - PAP

## 2014-07-01 ENCOUNTER — Encounter: Payer: Self-pay | Admitting: *Deleted

## 2014-09-17 ENCOUNTER — Ambulatory Visit (INDEPENDENT_AMBULATORY_CARE_PROVIDER_SITE_OTHER): Payer: 59 | Admitting: Podiatry

## 2014-09-17 DIAGNOSIS — M79676 Pain in unspecified toe(s): Secondary | ICD-10-CM

## 2014-09-17 DIAGNOSIS — B351 Tinea unguium: Secondary | ICD-10-CM | POA: Diagnosis not present

## 2014-09-17 DIAGNOSIS — E119 Type 2 diabetes mellitus without complications: Secondary | ICD-10-CM | POA: Diagnosis not present

## 2014-09-17 NOTE — Progress Notes (Signed)
Patient ID: Holly Stephens, female   DOB: Feb 02, 1956, 59 y.o.   MRN: [id number] Complaint:  Visit Type: Patient returns to my office for continued preventative foot care services. Complaint: Patient states" my nails have grown long and thick and become painful to walk and wear shoes" Patient has been diagnosed with DM with no foot complications. The patient presents for preventative foot care services. No changes to ROS  Podiatric Exam: Vascular: dorsalis pedis and posterior tibial pulses are palpable bilateral. Capillary return is immediate. Temperature gradient is WNL. Skin turgor WNL  Sensorium: Normal Semmes Weinstein monofilament test. Normal tactile sensation bilaterally. Nail Exam: Pt has thick disfigured discolored nails with subungual debris noted bilateral entire nail hallux through fifth toenails Ulcer Exam: There is no evidence of ulcer or pre-ulcerative changes or infection. Orthopedic Exam: Muscle tone and strength are WNL. No limitations in general ROM. No crepitus or effusions noted. Foot type and digits show no abnormalities. Bony prominences are unremarkable. Skin: No Porokeratosis. No infection or ulcers  Diagnosis:  Onychomycosis, , Pain in right toe, pain in left toes  Treatment & Plan Procedures and Treatment: Consent by patient was obtained for treatment procedures. The patient understood the discussion of treatment and procedures well. All questions were answered thoroughly reviewed. Debridement of mycotic and hypertrophic toenails, 1 through 5 bilateral and clearing of subungual debris. No ulceration, no infection noted.  Return Visit-Office Procedure: Patient instructed to return to the office for a follow up visit 3 months for continued evaluation and treatment.

## 2014-10-27 ENCOUNTER — Ambulatory Visit: Payer: 59 | Admitting: Internal Medicine

## 2014-11-24 ENCOUNTER — Other Ambulatory Visit (INDEPENDENT_AMBULATORY_CARE_PROVIDER_SITE_OTHER): Payer: 59

## 2014-11-24 DIAGNOSIS — B20 Human immunodeficiency virus [HIV] disease: Secondary | ICD-10-CM

## 2014-11-25 LAB — HIV-1 RNA QUANT-NO REFLEX-BLD: HIV-1 RNA Quant, Log: <1.3 {Log} (ref <1.30)

## 2014-11-25 LAB — T-HELPER CELL (CD4) - (RCID CLINIC ONLY)
CD4 % Helper T Cell: 28 % — ABNORMAL LOW (ref 33–55)
CD4 T Cell Abs: 760 /uL (ref 400–2700)

## 2014-12-08 ENCOUNTER — Ambulatory Visit (INDEPENDENT_AMBULATORY_CARE_PROVIDER_SITE_OTHER): Payer: 59 | Admitting: Internal Medicine

## 2014-12-08 ENCOUNTER — Encounter: Payer: Self-pay | Admitting: Internal Medicine

## 2014-12-08 VITALS — BP 151/80 | HR 63 | Temp 97.9°F | Ht 61.0 in | Wt 302.4 lb

## 2014-12-08 DIAGNOSIS — B20 Human immunodeficiency virus [HIV] disease: Secondary | ICD-10-CM | POA: Diagnosis not present

## 2014-12-08 NOTE — Assessment & Plan Note (Signed)
Holly Stephens is HIV infection remains under excellent control on her current 4 drug regimen. I will continue [name] and [name] and have her return after lab work in 6 months.

## 2014-12-08 NOTE — Progress Notes (Signed)
Patient ID: Holly Stephens, female   DOB: Sep 13, 1955, 59 y.o.   MRN: [id number]          Patient Active Problem List   Diagnosis Date Noted  . Human immunodeficiency virus (HIV) disease ([place]) 02/23/2006    Priority: High  . Visual floaters 09/12/2011  . Episodic low back pain 11/15/2010  . ROTATOR CUFF INJURY, RIGHT SHOULDER 11/03/2008  . ADHESIVE CAPSULITIS OF SHOULDER 11/12/2007  . OBESITY NOS 05/17/2006  . SYNDROME, CARPAL TUNNEL 05/17/2006  . FOOT PAIN 05/17/2006  . HYPERLIPIDEMIA 02/23/2006  . DEPRESSION 02/23/2006  . HYPERTENSION 02/23/2006    Patient's Medications  New Prescriptions   No medications on file  Previous Medications   B COMPLEX VITAMINS CAPSULE    Take 1 capsule by mouth daily.   DARUNAVIR ETHANOLATE ([name]) 800 MG TABLET    Take 1 tablet (800 mg total) by mouth daily with breakfast.   ELVITEGRAVIR-COBICISTAT-EMTRICITABINE-TENOFOVIR ([name]) 150-150-200-10 MG TABS TABLET    Take 1 tablet by mouth daily with breakfast.   FOLIC ACID (FOLVITE) 740 MCG TABLET    Take 1 tablet (800 mcg total) by mouth daily.   GARLIC 8144 MG CAPS    Take by mouth.   JANUMET 50-500 MG PER TABLET       METOPROLOL (LOPRESSOR) 50 MG TABLET    Take 1 tablet (50 mg total) by mouth daily.   MULTIPLE VITAMIN (MULTIVITAMIN) TABLET    Take 1 tablet by mouth daily.     OMEGA-3 FISH OIL (MAXEPA) 1000 MG CAPS CAPSULE    Take 1 capsule (1,000 mg total) by mouth daily.  Modified Medications   No medications on file  Discontinued Medications   ROSUVASTATIN (CRESTOR) 5 MG TABLET    Take 2.5 mg by mouth daily at 6 PM.    Subjective: Mykhia is in for her routine HIV follow-up visit. She has had no problems taking her tolerating her [name] or [name]. She has not missed any doses. She recently saw her primary care physician, Dr. Nevada Crane. She tells me that her liver enzymes were elevated so he stopped her Crestor. She has follow-up visit with him in December. She has not been getting regular  exercise. She tells me that it is difficult because she has been working so much over time at work.   Review of Systems: Pertinent items are noted in HPI.  Past Medical History  Diagnosis Date  . HIV infection ([place])   . Diabetes mellitus without complication [facility])     Social History  Substance Use Topics  . Smoking status: Never Smoker   . Smokeless tobacco: Never Used  . Alcohol Use: No    No family history on file.  Allergies  Allergen Reactions  . Amoxicillin-Pot Clavulanate     Objective:  Filed Vitals:   12/08/14 0907  BP: 151/80  Pulse: 63  Temp: 97.9 F (36.6 C)  TempSrc: Oral  Height: 5\' 1"  (1.549 m)  Weight: 302 lb 6.4 oz (137.168 kg)   Body mass index is 57.17 kg/(m^2).   General: Her weight is up 4 and half pounds since her last visit Oral: No oropharyngeal lesions Skin: No rash Lungs: Clear Cor: Regular S1 and S2 with no murmurs  Lab Results Lab Results  Component Value Date   WBC 8.0 05/29/2014   HGB 15.2* 05/29/2014   HCT 44.4 05/29/2014   MCV 91.2 05/29/2014   PLT 244 05/29/2014    Lab Results  Component Value Date   CREATININE 0.58  05/29/2014   BUN 14 05/29/2014   NA 139 05/29/2014   K 4.7 05/29/2014   CL 100 05/29/2014   CO2 29 05/29/2014    Lab Results  Component Value Date   ALT 55* 05/29/2014   AST 35 05/29/2014   ALKPHOS 41 05/29/2014   BILITOT 0.4 05/29/2014    Lab Results  Component Value Date   CHOL 228* 05/29/2014   HDL 40* 05/29/2014   LDLCALC 142* 05/29/2014   TRIG 231* 05/29/2014   CHOLHDL 5.7 05/29/2014    Lab Results HIV 1 RNA QUANT (copies/mL)  Date Value  11/24/2014 <20  05/29/2014 <20  11/10/2013 <20   CD4 T CELL ABS (/uL)  Date Value  11/24/2014 760  05/29/2014 790  11/10/2013 690     Problem List Items Addressed This Visit      High   Human immunodeficiency virus (HIV) disease ([place]) - Primary    Hasana is HIV infection remains under excellent control on her current 4 drug regimen. I  will continue [name] and [name] and have her return after lab work in 6 months.           Michel Bickers, MD [facility] [phone] pager   [phone] cell 12/08/2014, 9:30 AM

## 2014-12-29 ENCOUNTER — Encounter: Payer: Self-pay | Admitting: Podiatry

## 2014-12-29 ENCOUNTER — Ambulatory Visit (INDEPENDENT_AMBULATORY_CARE_PROVIDER_SITE_OTHER): Payer: 59 | Admitting: Podiatry

## 2014-12-29 DIAGNOSIS — M79676 Pain in unspecified toe(s): Secondary | ICD-10-CM | POA: Diagnosis not present

## 2014-12-29 DIAGNOSIS — B351 Tinea unguium: Secondary | ICD-10-CM | POA: Diagnosis not present

## 2014-12-29 DIAGNOSIS — E119 Type 2 diabetes mellitus without complications: Secondary | ICD-10-CM

## 2014-12-29 NOTE — Progress Notes (Signed)
Patient ID: Holly Stephens, female   DOB: 07/25/1955, 59 y.o.   MRN: [id number] Complaint:  Visit Type: Patient returns to my office for continued preventative foot care services. Complaint: Patient states" my nails have grown long and thick and become painful to walk and wear shoes" Patient has been diagnosed with DM with no foot complications. The patient presents for preventative foot care services. No changes to ROS  Podiatric Exam: Vascular: dorsalis pedis and posterior tibial pulses are palpable bilateral. Capillary return is immediate. Temperature gradient is WNL. Skin turgor WNL  Sensorium: Normal Semmes Weinstein monofilament test. Normal tactile sensation bilaterally. Nail Exam: Pt has thick disfigured discolored nails with subungual debris noted bilateral entire nail hallux through fifth toenails Ulcer Exam: There is no evidence of ulcer or pre-ulcerative changes or infection. Orthopedic Exam: Muscle tone and strength are WNL. No limitations in general ROM. No crepitus or effusions noted. Foot type and digits show no abnormalities. Bony prominences are unremarkable. Skin: No Porokeratosis. No infection or ulcers  Diagnosis:  Onychomycosis, , Pain in right toe, pain in left toes  Treatment & Plan Procedures and Treatment: Consent by patient was obtained for treatment procedures. The patient understood the discussion of treatment and procedures well. All questions were answered thoroughly reviewed. Debridement of mycotic and hypertrophic toenails, 1 through 5 bilateral and clearing of subungual debris. No ulceration, no infection noted.  Return Visit-Office Procedure: Patient instructed to return to the office for a follow up visit 3 months for continued evaluation and treatment. 

## 2015-01-07 ENCOUNTER — Other Ambulatory Visit: Payer: Self-pay | Admitting: Internal Medicine

## 2015-02-23 ENCOUNTER — Encounter: Payer: Self-pay | Admitting: Internal Medicine

## 2015-02-24 ENCOUNTER — Other Ambulatory Visit: Payer: Self-pay | Admitting: *Deleted

## 2015-02-24 DIAGNOSIS — B20 Human immunodeficiency virus [HIV] disease: Secondary | ICD-10-CM

## 2015-02-24 MED ORDER — DARUNAVIR ETHANOLATE 800 MG PO TABS: 800.0000 mg | ORAL_TABLET | Freq: Every day | ORAL | Status: DC

## 2015-02-24 MED ORDER — ELVITEG-COBIC-EMTRICIT-TENOFAF 150-150-200-10 MG PO TABS: 1.0000 | ORAL_TABLET | Freq: Every day | ORAL | Status: DC

## 2015-04-02 ENCOUNTER — Ambulatory Visit: Payer: 59 | Admitting: Podiatry

## 2015-04-02 ENCOUNTER — Encounter: Payer: Self-pay | Admitting: Podiatry

## 2015-04-02 ENCOUNTER — Ambulatory Visit (INDEPENDENT_AMBULATORY_CARE_PROVIDER_SITE_OTHER): Payer: 59 | Admitting: Podiatry

## 2015-04-02 DIAGNOSIS — B351 Tinea unguium: Secondary | ICD-10-CM | POA: Diagnosis not present

## 2015-04-02 DIAGNOSIS — E119 Type 2 diabetes mellitus without complications: Secondary | ICD-10-CM

## 2015-04-02 DIAGNOSIS — M79676 Pain in unspecified toe(s): Secondary | ICD-10-CM

## 2015-04-02 NOTE — Progress Notes (Signed)
Patient ID: Holly Stephens, female   DOB: 1956/01/08, 60 y.o.   MRN: [id number] Complaint:  Visit Type: Patient returns to my office for continued preventative foot care services. Complaint: Patient states" my nails have grown long and thick and become painful to walk and wear shoes" Patient has been diagnosed with DM with no foot complications. The patient presents for preventative foot care services. No changes to ROS  Podiatric Exam: Vascular: dorsalis pedis and posterior tibial pulses are palpable bilateral. Capillary return is immediate. Temperature gradient is WNL. Skin turgor WNL  Sensorium: Normal Semmes Weinstein monofilament test. Normal tactile sensation bilaterally. Nail Exam: Pt has thick disfigured discolored nails with subungual debris noted bilateral entire nail hallux through fifth toenails Ulcer Exam: There is no evidence of ulcer or pre-ulcerative changes or infection. Orthopedic Exam: Muscle tone and strength are WNL. No limitations in general ROM. No crepitus or effusions noted. Foot type and digits show no abnormalities. Bony prominences are unremarkable. Skin: No Porokeratosis. No infection or ulcers  Diagnosis:  Onychomycosis, , Pain in right toe, pain in left toes  Treatment & Plan Procedures and Treatment: Consent by patient was obtained for treatment procedures. The patient understood the discussion of treatment and procedures well. All questions were answered thoroughly reviewed. Debridement of mycotic and hypertrophic toenails, 1 through 5 bilateral and clearing of subungual debris. No ulceration, no infection noted.  Return Visit-Office Procedure: Patient instructed to return to the office for a follow up visit 3 months for continued evaluation and treatment.   Gardiner Barefoot DPM

## 2015-06-01 ENCOUNTER — Other Ambulatory Visit: Payer: 59

## 2015-06-01 DIAGNOSIS — Z79899 Other long term (current) drug therapy: Secondary | ICD-10-CM

## 2015-06-01 DIAGNOSIS — B20 Human immunodeficiency virus [HIV] disease: Secondary | ICD-10-CM

## 2015-06-01 DIAGNOSIS — Z113 Encounter for screening for infections with a predominantly sexual mode of transmission: Secondary | ICD-10-CM

## 2015-06-01 LAB — CBC WITH DIFFERENTIAL/PLATELET
BASOS PCT: 0 %
Basophils Absolute: 0 cells/uL (ref 0–200)
EOS ABS: 348 {cells}/uL (ref 15–500)
Eosinophils Relative: 3 %
HEMATOCRIT: 42.6 % (ref 35.0–45.0)
Hemoglobin: 14.3 g/dL (ref 11.7–15.5)
Lymphocytes Relative: 22 %
Lymphs Abs: 2552 cells/uL (ref 850–3900)
MCH: 30.4 pg (ref 27.0–33.0)
MCHC: 33.6 g/dL (ref 32.0–36.0)
MCV: 90.4 fL (ref 80.0–100.0)
MONO ABS: 580 {cells}/uL (ref 200–950)
MONOS PCT: 5 %
MPV: 9.4 fL (ref 7.5–12.5)
NEUTROS ABS: 8120 {cells}/uL — AB (ref 1500–7800)
Neutrophils Relative %: 70 %
PLATELETS: 270 10*3/uL (ref 140–400)
RBC: 4.71 MIL/uL (ref 3.80–5.10)
RDW: 14.1 % (ref 11.0–15.0)
WBC: 11.6 10*3/uL — AB (ref 3.8–10.8)

## 2015-06-01 LAB — COMPREHENSIVE METABOLIC PANEL
ALBUMIN: 4.2 g/dL (ref 3.6–5.1)
ALT: 48 U/L — ABNORMAL HIGH (ref 6–29)
AST: 36 U/L — ABNORMAL HIGH (ref 10–35)
Alkaline Phosphatase: 41 U/L (ref 33–130)
BUN: 15 mg/dL (ref 7–25)
CALCIUM: 9.8 mg/dL (ref 8.6–10.4)
CHLORIDE: 104 mmol/L (ref 98–110)
CO2: 24 mmol/L (ref 20–31)
Creat: 0.52 mg/dL (ref 0.50–1.05)
Glucose, Bld: 132 mg/dL — ABNORMAL HIGH (ref 65–99)
Potassium: 4.4 mmol/L (ref 3.5–5.3)
Sodium: 141 mmol/L (ref 135–146)
Total Bilirubin: 0.4 mg/dL (ref 0.2–1.2)
Total Protein: 7.7 g/dL (ref 6.1–8.1)

## 2015-06-01 LAB — LIPID PANEL
CHOL/HDL RATIO: 6 ratio — AB (ref <=5.0)
Cholesterol: 228 mg/dL — ABNORMAL HIGH (ref 125–200)
HDL: 38 mg/dL — ABNORMAL LOW (ref >=46)
LDL Cholesterol: 158 mg/dL — ABNORMAL HIGH (ref <130)
TRIGLYCERIDES: 162 mg/dL — AB (ref <150)
VLDL: 32 mg/dL — ABNORMAL HIGH (ref <30)

## 2015-06-02 ENCOUNTER — Other Ambulatory Visit: Payer: 59

## 2015-06-02 LAB — T-HELPER CELL (CD4) - (RCID CLINIC ONLY)
CD4 T CELL HELPER: 30 % — AB (ref 33–55)
CD4 T Cell Abs: 790 /uL (ref 400–2700)

## 2015-06-02 LAB — HIV-1 RNA QUANT-NO REFLEX-BLD

## 2015-06-02 LAB — RPR

## 2015-06-15 ENCOUNTER — Encounter: Payer: Self-pay | Admitting: Internal Medicine

## 2015-06-15 ENCOUNTER — Ambulatory Visit (INDEPENDENT_AMBULATORY_CARE_PROVIDER_SITE_OTHER): Payer: 59 | Admitting: Internal Medicine

## 2015-06-15 ENCOUNTER — Other Ambulatory Visit (HOSPITAL_COMMUNITY): Payer: Self-pay | Admitting: Internal Medicine

## 2015-06-15 DIAGNOSIS — N63 Unspecified lump in unspecified breast: Secondary | ICD-10-CM

## 2015-06-15 DIAGNOSIS — B20 Human immunodeficiency virus [HIV] disease: Secondary | ICD-10-CM | POA: Diagnosis not present

## 2015-06-15 NOTE — Assessment & Plan Note (Signed)
Her infection remains under excellent control. She will continue her current antiretroviral regimen and follow-up after lab work in 6 months.

## 2015-06-15 NOTE — Progress Notes (Signed)
Patient Active Problem List   Diagnosis Date Noted  . Human immunodeficiency virus (HIV) disease ([place]) 02/23/2006    Priority: High  . Visual floaters 09/12/2011  . Episodic low back pain 11/15/2010  . ROTATOR CUFF INJURY, RIGHT SHOULDER 11/03/2008  . ADHESIVE CAPSULITIS OF SHOULDER 11/12/2007  . OBESITY NOS 05/17/2006  . SYNDROME, CARPAL TUNNEL 05/17/2006  . FOOT PAIN 05/17/2006  . HYPERLIPIDEMIA 02/23/2006  . DEPRESSION 02/23/2006  . HYPERTENSION 02/23/2006    Patient's Medications  New Prescriptions   No medications on file  Previous Medications   B COMPLEX VITAMINS CAPSULE    Take 1 capsule by mouth daily.   DARUNAVIR ETHANOLATE ([name]) 800 MG TABLET    Take 1 tablet (800 mg total) by mouth daily with breakfast.   ELVITEGRAVIR-COBICISTAT-EMTRICITABINE-TENOFOVIR ([name]) 150-150-200-10 MG TABS TABLET    Take 1 tablet by mouth daily with breakfast.   FOLIC ACID (FOLVITE) Q000111Q MCG TABLET    Take 1 tablet (800 mcg total) by mouth daily.   GARLIC 123XX123 MG CAPS    Take by mouth. Reported on 06/15/2015   JANUMET 50-500 MG PER TABLET       METOPROLOL (LOPRESSOR) 50 MG TABLET    TAKE 1 TABLET DAILY   MULTIPLE VITAMIN (MULTIVITAMIN) TABLET    Take 1 tablet by mouth daily.     OMEGA-3 FISH OIL (MAXEPA) 1000 MG CAPS CAPSULE    Take 1 capsule (1,000 mg total) by mouth daily.  Modified Medications   No medications on file  Discontinued Medications   No medications on file    Subjective: Miagrace is in for her routine HIV follow-up visit. She's had no problems obtaining, taking or tolerating her [name] and [name]. She takes them each morning with breakfast. She denies missing any doses since her last visit. She is happy to tell me that her hemoglobin A1c was down to 5.5 when she was seen at her primary care office yesterday. She has been having pain in both wrists, hands and knees recently. She saw Dr. Layne Benton again and was told that her lupus test was positive. She is  scheduled for a rheumatology evaluation next week.   Review of Systems: Review of Systems  Constitutional: Negative for fever, chills, weight loss, malaise/fatigue and diaphoresis.  HENT: Negative for sore throat.   Respiratory: Negative for cough, sputum production and shortness of breath.   Cardiovascular: Negative for chest pain.  Gastrointestinal: Negative for nausea, vomiting, abdominal pain and diarrhea.  Genitourinary: Negative for dysuria and frequency.  Musculoskeletal: Positive for joint pain. Negative for myalgias.  Skin: Negative for rash.  Neurological: Negative for dizziness and headaches.  Psychiatric/Behavioral: Negative for depression and substance abuse. The patient is not nervous/anxious.     Past Medical History  Diagnosis Date  . HIV infection ([place])   . Diabetes mellitus without complication [facility])     Social History  Substance Use Topics  . Smoking status: Never Smoker   . Smokeless tobacco: Never Used  . Alcohol Use: No    No family history on file.  Allergies  Allergen Reactions  . Amoxicillin-Pot Clavulanate     Objective:  Filed Vitals:   06/15/15 0837  BP: 129/74  Pulse: 71  Temp: 98.1 F (36.7 C)  TempSrc: Oral  Weight: 297 lb (134.718 kg)   Body mass index is 56.15 kg/(m^2).  Physical Exam  Constitutional: She is oriented to person, place, and time.  She is in good spirits.  HENT:  Mouth/Throat: No oropharyngeal exudate.  Eyes: Conjunctivae are normal.  Cardiovascular: Normal rate and regular rhythm.   No murmur heard. Pulmonary/Chest: Breath sounds normal.  Abdominal: Soft. She exhibits no mass. There is no tenderness.  Musculoskeletal: Normal range of motion. She exhibits no edema or tenderness.  Neurological: She is alert and oriented to person, place, and time.  Skin: No rash noted.  Psychiatric: Mood and affect normal.    Lab Results Lab Results  Component Value Date   WBC 11.6* 06/01/2015   HGB 14.3 06/01/2015    HCT 42.6 06/01/2015   MCV 90.4 06/01/2015   PLT 270 06/01/2015    Lab Results  Component Value Date   CREATININE 0.52 06/01/2015   BUN 15 06/01/2015   NA 141 06/01/2015   K 4.4 06/01/2015   CL 104 06/01/2015   CO2 24 06/01/2015    Lab Results  Component Value Date   ALT 48* 06/01/2015   AST 36* 06/01/2015   ALKPHOS 41 06/01/2015   BILITOT 0.4 06/01/2015    Lab Results  Component Value Date   CHOL 228* 06/01/2015   HDL 38* 06/01/2015   LDLCALC 158* 06/01/2015   TRIG 162* 06/01/2015   CHOLHDL 6.0* 06/01/2015    Lab Results HIV 1 RNA QUANT (copies/mL)  Date Value  06/01/2015 <20  11/24/2014 <20  05/29/2014 <20   CD4 T CELL ABS (/uL)  Date Value  06/01/2015 790  11/24/2014 760  05/29/2014 790      Problem List Items Addressed This Visit      High   Human immunodeficiency virus (HIV) disease ([place])    Her infection remains under excellent control. She will continue her current antiretroviral regimen and follow-up after lab work in 6 months.      Relevant Orders   T-helper cell (CD4)- ([facility] clinic only)   HIV 1 RNA quant-no reflex-bld   CBC   Comprehensive metabolic panel        Michel Bickers, MD [facility] for [place] [phone] pager   [phone] cell 06/15/2015, 8:51 AM

## 2015-06-17 ENCOUNTER — Encounter: Payer: Self-pay | Admitting: Internal Medicine

## 2015-06-21 ENCOUNTER — Other Ambulatory Visit (HOSPITAL_COMMUNITY): Payer: Self-pay | Admitting: Internal Medicine

## 2015-06-21 DIAGNOSIS — N6320 Unspecified lump in the left breast, unspecified quadrant: Secondary | ICD-10-CM

## 2015-06-22 ENCOUNTER — Ambulatory Visit (HOSPITAL_COMMUNITY)
Admission: RE | Admit: 2015-06-22 | Discharge: 2015-06-22 | Disposition: A | Discharge status: Home / Self Care | Payer: 59 | Source: Ambulatory Visit | Attending: Internal Medicine | Admitting: Internal Medicine

## 2015-06-22 ENCOUNTER — Other Ambulatory Visit: Payer: Self-pay | Admitting: *Deleted

## 2015-06-22 ENCOUNTER — Other Ambulatory Visit (HOSPITAL_COMMUNITY): Payer: Self-pay | Admitting: Internal Medicine

## 2015-06-22 DIAGNOSIS — N63 Unspecified lump in breast: Secondary | ICD-10-CM | POA: Diagnosis present

## 2015-06-22 DIAGNOSIS — N6320 Unspecified lump in the left breast, unspecified quadrant: Secondary | ICD-10-CM

## 2015-06-22 DIAGNOSIS — G5603 Carpal tunnel syndrome, bilateral upper limbs: Secondary | ICD-10-CM

## 2015-06-22 DIAGNOSIS — R590 Localized enlarged lymph nodes: Secondary | ICD-10-CM | POA: Insufficient documentation

## 2015-06-29 ENCOUNTER — Ambulatory Visit: Payer: 59 | Admitting: Podiatry

## 2015-06-29 ENCOUNTER — Encounter (HOSPITAL_COMMUNITY): Payer: 59

## 2015-07-02 ENCOUNTER — Ambulatory Visit: Payer: 59 | Admitting: Podiatry

## 2015-07-06 ENCOUNTER — Ambulatory Visit (INDEPENDENT_AMBULATORY_CARE_PROVIDER_SITE_OTHER): Payer: 59 | Admitting: Neurology

## 2015-07-06 ENCOUNTER — Ambulatory Visit (HOSPITAL_COMMUNITY)
Admission: RE | Admit: 2015-07-06 | Discharge: 2015-07-06 | Disposition: A | Discharge status: Home / Self Care | Payer: 59 | Source: Ambulatory Visit | Attending: Internal Medicine | Admitting: Internal Medicine

## 2015-07-06 ENCOUNTER — Other Ambulatory Visit (HOSPITAL_COMMUNITY): Payer: Self-pay | Admitting: Internal Medicine

## 2015-07-06 DIAGNOSIS — N63 Unspecified lump in breast: Secondary | ICD-10-CM | POA: Insufficient documentation

## 2015-07-06 DIAGNOSIS — N6320 Unspecified lump in the left breast, unspecified quadrant: Secondary | ICD-10-CM

## 2015-07-06 DIAGNOSIS — G5603 Carpal tunnel syndrome, bilateral upper limbs: Secondary | ICD-10-CM

## 2015-07-06 MED ORDER — LIDOCAINE HCL (PF) 1 % IJ SOLN
INTRAMUSCULAR | Status: AC
  Filled 2015-07-06: qty 10

## 2015-07-06 NOTE — Procedures (Signed)
[facility]  [address]  [place], [place] [address] Tel: [phone] Fax:  [phone] Test Date:  07/06/2015  Patient: Holly Stephens DOB: 1955-08-04 Physician: Narda Amber, DO  Sex: Female Height: 5\' 1"  Ref Phys: Wandra Feinstein, MD  ID#: [id number] Temp: 35.3C Technician: Jerilynn Mages. Dean   Patient Complaints: This is a 60 year old female referred for evaluation of bilateral hand paresthesias.  NCV & EMG Findings: Extensive electrodiagnostic testing of the right upper extremity and additional studies of the left shows: 1. Bilateral median and ulnar sensory responses are within normal limits. Bilateral palmar mixed studies show prolonged latency. 2. Bilateral median and ulnar motor responses are within normal limits. 3. There is no evidence of active or chronic motor axon loss changes affecting any of the tested muscles. Motor unit configuration and recruitment pattern is within normal limits.  Impression: 1. Bilateral median neuropathy at or distal to the wrist, consistent with the clinical diagnosis of carpal tunnel syndrome. Overall, these findings are mild in degree electrically. 2. There is no evidence of a cervical radiculopathy affecting the upper extremities.    ___________________________ Narda Amber, DO    Nerve Conduction Studies Anti Sensory Summary Table   Site NR Peak (ms) Norm Peak (ms) P-T Amp (V) Norm P-T Amp  Left Median Anti Sensory (2nd Digit)  35.3C  Wrist    3.7 <3.8 27.6 >10  Right Median Anti Sensory (2nd Digit)  35.3C  Site 3    3.5  31.1   Left Ulnar Anti Sensory (5th Digit)  35.3C  Wrist    2.3 <3.2 26.5 >5  Right Ulnar Anti Sensory (5th Digit)  35.3C  Wrist    2.7 <3.2 31.3 >5   Motor Summary Table   Site NR Onset (ms) Norm Onset (ms) O-P Amp (mV) Norm O-P Amp Site1 Site2 Delta-0 (ms) Dist (cm) Vel (m/s) Norm Vel (m/s)  Left Median Motor (Abd Poll Brev)  35.3C  Wrist    3.7 <4.0 9.8 >5 Elbow Wrist 4.6 27.0 59 >50    Elbow    8.3  9.0         Right Median Motor (Abd Poll Brev)  35.3C  Wrist    3.8 <4.0 11.2 >5 Elbow Wrist 3.7 24.0 65 >50  Elbow    7.5  10.9         Left Ulnar Motor (Abd Dig Minimi)  35.3C  Wrist    2.8 <3.1 8.5 >7 B Elbow Wrist 2.9 17.0 59 >50  B Elbow    5.7  7.8  A Elbow B Elbow 1.6 10.0 63 >50  A Elbow    7.3  7.8         Right Ulnar Motor (Abd Dig Minimi)  35.3C  Wrist    2.5 <3.1 8.6 >7 B Elbow Wrist 2.8 18.0 64 >50  B Elbow    5.3  7.7  A Elbow B Elbow 1.6 10.0 62 >50  A Elbow    6.9  7.6          Comparison Summary Table   Site NR Peak (ms) Norm Peak (ms) P-T Amp (V) Site1 Site2 Delta-P (ms) Norm Delta (ms)  Left Median/Ulnar Palm Comparison (Wrist - 8cm)  35.3C  Median Palm    2.4 <2.2 23.8 Median Palm Ulnar Palm 0.9   Ulnar Palm    1.5 <2.2 12.7      Right Median/Ulnar Palm Comparison (Wrist - 8cm)  35.3C  Median [employer]  2.5 <2.2 34.6 Median Palm Ulnar Palm 1.1   Ulnar Palm    1.4 <2.2 15.2       EMG   Side Muscle Ins Act Fibs Psw Fasc Number Recrt Dur Dur. Amp Amp. Poly Poly. Comment  Left 1stDorInt Nml Nml Nml Nml Nml Nml Nml Nml Nml Nml Nml Nml N/A  Left Abd Poll Brev Nml Nml Nml Nml Nml Nml Nml Nml Nml Nml Nml Nml N/A  Left Ext Indicis Nml Nml Nml Nml Nml Nml Nml Nml Nml Nml Nml Nml N/A  Left PronatorTeres Nml Nml Nml Nml Nml Nml Nml Nml Nml Nml Nml Nml N/A  Left Biceps Nml Nml Nml Nml Nml Nml Nml Nml Nml Nml Nml Nml N/A  Left Triceps Nml Nml Nml Nml Nml Nml Nml Nml Nml Nml Nml Nml N/A  Left Deltoid Nml Nml Nml Nml Nml Nml Nml Nml Nml Nml Nml Nml N/A  Right 1stDorInt Nml Nml Nml Nml Nml Nml Nml Nml Nml Nml Nml Nml N/A  Right Abd Poll Brev Nml Nml Nml Nml Nml Nml Nml Nml Nml Nml Nml Nml N/A  Right PronatorTeres Nml Nml Nml Nml Nml Nml Nml Nml Nml Nml Nml Nml N/A  Right Biceps Nml Nml Nml Nml Nml Nml Nml Nml Nml Nml Nml Nml N/A  Right Triceps Nml Nml Nml Nml Nml Nml Nml Nml Nml Nml Nml Nml N/A  Right Deltoid Nml Nml Nml Nml Nml Nml Nml Nml Nml Nml Nml Nml  N/A      Waveforms:

## 2015-07-20 ENCOUNTER — Ambulatory Visit: Payer: 59 | Admitting: Podiatry

## 2015-07-22 ENCOUNTER — Encounter (HOSPITAL_COMMUNITY)
Admission: RE | Admit: 2015-07-22 | Discharge: 2015-07-22 | Disposition: A | Discharge status: Home / Self Care | Payer: 59 | Source: Ambulatory Visit | Attending: Surgery | Admitting: Surgery

## 2015-07-22 ENCOUNTER — Encounter (HOSPITAL_COMMUNITY): Payer: Self-pay

## 2015-07-22 DIAGNOSIS — E669 Obesity, unspecified: Secondary | ICD-10-CM | POA: Diagnosis not present

## 2015-07-22 DIAGNOSIS — I10 Essential (primary) hypertension: Secondary | ICD-10-CM | POA: Diagnosis not present

## 2015-07-22 DIAGNOSIS — R590 Localized enlarged lymph nodes: Secondary | ICD-10-CM | POA: Diagnosis present

## 2015-07-22 DIAGNOSIS — Z21 Asymptomatic human immunodeficiency virus [HIV] infection status: Secondary | ICD-10-CM | POA: Diagnosis not present

## 2015-07-22 DIAGNOSIS — C8110 Nodular sclerosis classical Hodgkin lymphoma, unspecified site: Secondary | ICD-10-CM | POA: Diagnosis not present

## 2015-07-22 DIAGNOSIS — E119 Type 2 diabetes mellitus without complications: Secondary | ICD-10-CM | POA: Diagnosis not present

## 2015-07-22 DIAGNOSIS — F329 Major depressive disorder, single episode, unspecified: Secondary | ICD-10-CM | POA: Diagnosis not present

## 2015-07-22 DIAGNOSIS — Z6841 Body Mass Index (BMI) 40.0 and over, adult: Secondary | ICD-10-CM | POA: Diagnosis not present

## 2015-07-22 HISTORY — DX: Unspecified osteoarthritis, unspecified site: M19.90

## 2015-07-22 LAB — BASIC METABOLIC PANEL
ANION GAP: 7 (ref 5–15)
BUN: 20 mg/dL (ref 6–20)
CALCIUM: 9.7 mg/dL (ref 8.9–10.3)
CO2: 25 mmol/L (ref 22–32)
Chloride: 106 mmol/L (ref 101–111)
Creatinine, Ser: 0.61 mg/dL (ref 0.44–1.00)
Glucose, Bld: 120 mg/dL — ABNORMAL HIGH (ref 65–99)
POTASSIUM: 4 mmol/L (ref 3.5–5.1)
SODIUM: 138 mmol/L (ref 135–145)

## 2015-07-22 LAB — CBC WITH DIFFERENTIAL/PLATELET
BASOS ABS: 0 10*3/uL (ref 0.0–0.1)
BASOS PCT: 0 %
EOS ABS: 0.3 10*3/uL (ref 0.0–0.7)
EOS PCT: 2 %
HCT: 42 % (ref 36.0–46.0)
Hemoglobin: 13.3 g/dL (ref 12.0–15.0)
LYMPHS PCT: 28 %
Lymphs Abs: 3.4 10*3/uL (ref 0.7–4.0)
MCH: 29.4 pg (ref 26.0–34.0)
MCHC: 31.7 g/dL (ref 30.0–36.0)
MCV: 92.9 fL (ref 78.0–100.0)
Monocytes Absolute: 0.7 10*3/uL (ref 0.1–1.0)
Monocytes Relative: 6 %
Neutro Abs: 7.5 10*3/uL (ref 1.7–7.7)
Neutrophils Relative %: 64 %
PLATELETS: 287 10*3/uL (ref 150–400)
RBC: 4.52 MIL/uL (ref 3.87–5.11)
RDW: 14.2 % (ref 11.5–15.5)
WBC: 11.8 10*3/uL — AB (ref 4.0–10.5)

## 2015-07-22 NOTE — H&P (Signed)
NTS SOAP Note  Vital Signs:  Vitals as of: Q000111Q: Systolic 99991111: Diastolic 78: Heart Rate 70: Temp 97.58F (Temporal): Height 79ft 1in: Weight 301Lbs 0 Ounces: BMI 56.87   BMI : 56.87 kg/m2  Subjective: This 60 year old female presents forpalpable Left axillary lymphadenopathy s/p non-diagnostic FNA suspicious for lymphoma. Patient first noted Left axillary mass nearly 3 months ago (March), at which time she reported it to her PMD, and workup was initiated including mammogram and FNA. Patient denies any previous similar masses or any CP, SOB, fevers, palpitations, or night sweats. She presents today for evaluation and anticipated excisional biopsy.   Review of Symptoms:  Constitutional:No fevers, chills, or unexplained weight loss Head:Atraumatic; no masses; no abnormalities Eyes:No visual changes or eye pain Nose/Mouth/Throat:No nasal congestion, rhinorrhea, oral lesions, postnasal drip or sore throat  Cardiovascular: No chest pain or palpitations.  Respiratory:No cough, shortness of breath or wheezing  GastrointestinNo diarrhea, constipation, blood in stools, abdominal pain, vomiting or heartburn Genitourinary:No urinary frequency, hematuria, incontinence, or dysuria Musculoskeletal:No arthalgias, myalgias or joint swelling Skin:No rash or bothersome skin lesions Breast:No lumps or nipple discharge, Left axillary mass as per HPI Hematolgic/Lymphatic:No easy bruising or bleeding, Left axillary mass as per HPI    Past Medical History:Reviewed  Past Medical History     Diabetes, High Blood pressure, High cholesterol, HIV/AIDS reportedly well-controlled, workup for Lupus ongoing   Social History:Reviewed  Social History  Preferred Language: English Race:  White Ethnicity: Not Hispanic / Latino Age: 40 year Marital Status:  S Alcohol: No Recreational drug(s): No   Smoking Status: Unknown if ever smoked Functional Status reviewed on  07/20/2015 ------------------------------------------------ Bathing: Normal Cooking: Normal Dressing: Normal Driving: Normal Eating: Normal Managing Meds: Normal Oral Care: Normal Shopping: Normal Toileting: Normal Transferring: Normal Walking: Normal Cognitive Status reviewed on 07/20/2015 ------------------------------------------------ Attention: Normal Decision Making: Normal Language: Normal Memory: Normal Motor: Normal Perception: Normal Problem Solving: Normal Visual and Spatial: Normal   Family History:Reviewed  Family Health History Mother, Unknown; Diabetes mellitus, unspecified type; Stroke (CVA);  Father, Unknown; Heart disease;  Sister, Living; Thromboembolic disorder - DVT;     Objective Information: General:Well appearing, well nourished in no distress. Skin:no rash or prominent lesions Head:Atraumatic; no masses; no abnormalities Eyes:conjunctiva clear, EOM intact, PERRL Mouth:Mucous membranes moist, no mucosal lesions. Throat:no erythema, exudates or lesions. Neck:Supple without lymphadenopathy.  Heart:RRR, no murmur  Lungs:CTA bilaterally, no wheezes, rhonchi, rales.  Breathing unlabored. Breasts:No lumps or nipple discharge Abdomen:Soft, NT/ND, no HSM, no masses. Extremities:No deformities, clubbing, cyanosis, or edema.  Lymphatics:Palpable firm and mobile NT Left antero-axillary mass without erythema or drainage  Assessment:60 year old Female with Left axillary lymphadenopathy s/p non-diagnostic FNA suspicious for lymphoma  Diagnosis & Procedure:    Plan: Will plan for excisional biopsy of Left axillary mass. All risks, benefits, and alternatives to above elective surgery were discussed in detail with the patient, all of her questions were answered to her expressed satisfaction, and informed consent was obtained.  Patient Education:Alternative treatments to surgery were discussed with patient (and family).Risks and  benefits  of procedure were fully explained to the patient (and family) who gave informed consent. Patient/family questions were addressed.

## 2015-07-22 NOTE — Patient Instructions (Signed)
Holly Stephens  07/22/2015     @PREFPERIOPPHARMACY @   Your procedure is scheduled on  07/23/2015   Report to [facility] at  900  A.M.  Call this number if you have problems the morning of surgery:  [phone]   Remember:  Do not eat food or drink liquids after midnight.  Take these medicines the morning of surgery with A SIP OF WATER  [name], genvoya, metoprolol.   Do not wear jewelry, make-up or nail polish.  Do not wear lotions, powders, or perfumes.  You may wear deodorant.  Do not shave 48 hours prior to surgery.  Men may shave face and neck.  Do not bring valuables to the hospital.  [facility] is not responsible for any belongings or valuables.  Contacts, dentures or bridgework may not be worn into surgery.  Leave your suitcase in the car.  After surgery it may be brought to your room.  For patients admitted to the hospital, discharge time will be determined by your treatment team.  Patients discharged the day of surgery will not be allowed to drive home.   Name and phone number of your driver:   family Special instructions:  none  Please read over the following fact sheets that you were given. Coughing and Deep Breathing, Surgical Site Infection Prevention, Anesthesia Post-op Instructions and Care and Recovery After Surgery      Incision and Drainage, Care After Refer to this sheet in the next few weeks. These instructions provide you with information on caring for yourself after your procedure. Your caregiver may also give you more specific instructions. Your treatment has been planned according to current medical practices, but problems sometimes occur. Call your caregiver if you have any problems or questions after your procedure. HOME CARE INSTRUCTIONS   If antibiotic medicine is given, take it as directed. Finish it even if you start to feel better.  Only take over-the-counter or prescription medicines for pain, discomfort, or fever as directed  by your caregiver.  Keep all follow-up appointments as directed by your caregiver.  Change any bandages (dressings) as directed by your caregiver. Replace old dressings with clean dressings.  Wash your hands before and after caring for your wound. You will receive specific instructions for cleansing and caring for your wound.  SEEK MEDICAL CARE IF:   You have increased pain, swelling, or redness around the wound.  You have increased drainage, smell, or bleeding from the wound.  You have muscle aches, chills, or you feel generally sick.  You have a fever. MAKE SURE YOU:   Understand these instructions.  Will watch your condition.  Will get help right away if you are not doing well or get worse.   This information is not intended to replace advice given to you by your health care provider. Make sure you discuss any questions you have with your health care provider.   Document Released: 05/01/2011 Document Revised: 02/27/2014 Document Reviewed: 05/01/2011 Elsevier Interactive Patient Education 2016 [place]  An incision (cut) is when a surgeon cuts into your body. After surgery, the cut needs to be well cared for to keep it from getting infected.  HOW TO CARE FOR YOUR CUT  Take medicines only as told by your doctor.  There are many different ways to close and cover a cut, including stitches, skin glue, and adhesive strips. Follow your doctor's instructions on:  Care of the cut.  Bandage (dressing)  changes and removal.  Cut closure removal.  Do not take baths, swim, or use a hot tub until your doctor says it is okay. You may shower as told by your doctor.  Return to your normal diet and activities as allowed by your doctor.  Use medicine that helps lessen itching on your cut as told by your doctor. Do not pick or scratch at your cut.  Drink enough fluids to keep your pee (urine) clear or pale yellow. GET HELP IF:  You have redness, puffiness  (swelling), or pain at the site of your cut.  You have fluid, blood, or pus coming from your cut.  Your muscles ache.  You have chills or you feel sick.  You have a bad smell coming from the cut or bandage.  Your cut opens up after stitches, staples, or adhesive strips have been removed.  You keep feeling sick to your stomach (nauseous) or keep throwing up (vomiting).  You have a fever.  You are dizzy. GET HELP RIGHT AWAY IF:  You have a rash.  You pass out (faint).  You have trouble breathing. MAKE SURE YOU:   Understand these instructions.  Will watch your condition.  Will get help right away if you are not doing well or get worse.   This information is not intended to replace advice given to you by your health care provider. Make sure you discuss any questions you have with your health care provider.   Document Released: 05/01/2011 Document Revised: 02/27/2014 Document Reviewed: 04/02/2013 Elsevier Interactive Patient Education 2016 [employer] PATIENT INSTRUCTIONS POST-ANESTHESIA  IMMEDIATELY FOLLOWING SURGERY:  Do not drive or operate machinery for the first twenty four hours after surgery.  Do not make any important decisions for twenty four hours after surgery or while taking narcotic pain medications or sedatives.  If you develop intractable nausea and vomiting or a severe headache please notify your doctor immediately.  FOLLOW-UP:  Please make an appointment with your surgeon as instructed. You do not need to follow up with anesthesia unless specifically instructed to do so.  WOUND CARE INSTRUCTIONS (if applicable):  Keep a dry clean dressing on the anesthesia/puncture wound site if there is drainage.  Once the wound has quit draining you may leave it open to air.  Generally you should leave the bandage intact for twenty four hours unless there is drainage.  If the epidural site drains for more than 36-48 hours please call the anesthesia  department.  QUESTIONS?:  Please feel free to call your physician or the hospital operator if you have any questions, and they will be happy to assist you.

## 2015-07-23 ENCOUNTER — Ambulatory Visit (HOSPITAL_COMMUNITY)
Admission: RE | Admit: 2015-07-23 | Discharge: 2015-07-23 | Disposition: A | Discharge status: Home / Self Care | Payer: 59 | Source: Ambulatory Visit | Attending: Surgery | Admitting: Surgery

## 2015-07-23 ENCOUNTER — Ambulatory Visit (HOSPITAL_COMMUNITY): Payer: 59 | Admitting: Anesthesiology

## 2015-07-23 ENCOUNTER — Encounter (HOSPITAL_COMMUNITY): Payer: Self-pay | Admitting: *Deleted

## 2015-07-23 ENCOUNTER — Encounter (HOSPITAL_COMMUNITY)
Admission: RE | Disposition: A | Discharge status: Home / Self Care | Payer: Self-pay | Source: Ambulatory Visit | Attending: Surgery

## 2015-07-23 DIAGNOSIS — Z21 Asymptomatic human immunodeficiency virus [HIV] infection status: Secondary | ICD-10-CM | POA: Insufficient documentation

## 2015-07-23 DIAGNOSIS — C8110 Nodular sclerosis classical Hodgkin lymphoma, unspecified site: Secondary | ICD-10-CM | POA: Diagnosis not present

## 2015-07-23 DIAGNOSIS — Z6841 Body Mass Index (BMI) 40.0 and over, adult: Secondary | ICD-10-CM | POA: Insufficient documentation

## 2015-07-23 DIAGNOSIS — F329 Major depressive disorder, single episode, unspecified: Secondary | ICD-10-CM | POA: Insufficient documentation

## 2015-07-23 DIAGNOSIS — E119 Type 2 diabetes mellitus without complications: Secondary | ICD-10-CM | POA: Insufficient documentation

## 2015-07-23 DIAGNOSIS — I10 Essential (primary) hypertension: Secondary | ICD-10-CM | POA: Insufficient documentation

## 2015-07-23 DIAGNOSIS — E669 Obesity, unspecified: Secondary | ICD-10-CM | POA: Insufficient documentation

## 2015-07-23 HISTORY — PX: AXILLARY LYMPH NODE BIOPSY: SHX5737

## 2015-07-23 LAB — GLUCOSE, CAPILLARY
GLUCOSE-CAPILLARY: 127 mg/dL — AB (ref 65–99)
Glucose-Capillary: 130 mg/dL — ABNORMAL HIGH (ref 65–99)

## 2015-07-23 SURGERY — AXILLARY LYMPH NODE BIOPSY
Anesthesia: General | Laterality: Left

## 2015-07-23 MED ORDER — FENTANYL CITRATE (PF) 100 MCG/2ML IJ SOLN
INTRAMUSCULAR | Status: AC
  Filled 2015-07-23: qty 2

## 2015-07-23 MED ORDER — MIDAZOLAM HCL 2 MG/2ML IJ SOLN
INTRAMUSCULAR | Status: AC
  Filled 2015-07-23: qty 2

## 2015-07-23 MED ORDER — BUPIVACAINE HCL (PF) 0.5 % IJ SOLN
INTRAMUSCULAR | Status: AC
  Filled 2015-07-23: qty 30

## 2015-07-23 MED ORDER — LIDOCAINE HCL (PF) 1 % IJ SOLN
INTRAMUSCULAR | Status: AC
  Filled 2015-07-23: qty 30

## 2015-07-23 MED ORDER — ONDANSETRON HCL 4 MG/2ML IJ SOLN: 4.0000 mg | Freq: Once | INTRAMUSCULAR | Status: DC | PRN

## 2015-07-23 MED ORDER — LIDOCAINE HCL (PF) 1 % IJ SOLN
INTRAMUSCULAR | Status: DC | PRN
  Administered 2015-07-23: 5 mL
  Administered 2015-07-23: 4 mL

## 2015-07-23 MED ORDER — EPHEDRINE SULFATE 50 MG/ML IJ SOLN
INTRAMUSCULAR | Status: DC | PRN
  Administered 2015-07-23 (×3): 10 mg via INTRAVENOUS

## 2015-07-23 MED ORDER — LACTATED RINGERS IV SOLN
INTRAVENOUS | Status: DC | PRN
  Administered 2015-07-23 (×2): via INTRAVENOUS

## 2015-07-23 MED ORDER — FENTANYL CITRATE (PF) 100 MCG/2ML IJ SOLN
INTRAMUSCULAR | Status: DC | PRN
  Administered 2015-07-23 (×8): 25 ug via INTRAVENOUS

## 2015-07-23 MED ORDER — LIDOCAINE HCL (PF) 1 % IJ SOLN
INTRAMUSCULAR | Status: DC | PRN
  Administered 2015-07-23: 50 mg

## 2015-07-23 MED ORDER — EPHEDRINE SULFATE 50 MG/ML IJ SOLN
INTRAMUSCULAR | Status: AC
  Filled 2015-07-23: qty 1

## 2015-07-23 MED ORDER — PROPOFOL 10 MG/ML IV BOLUS
INTRAVENOUS | Status: DC | PRN
  Administered 2015-07-23: 200 mg via INTRAVENOUS

## 2015-07-23 MED ORDER — PROPOFOL 10 MG/ML IV BOLUS
INTRAVENOUS | Status: AC
  Filled 2015-07-23: qty 20

## 2015-07-23 MED ORDER — CHLORHEXIDINE GLUCONATE 4 % EX LIQD: 1.0000 "application " | Freq: Once | CUTANEOUS | Status: DC

## 2015-07-23 MED ORDER — ONDANSETRON HCL 4 MG/2ML IJ SOLN
INTRAMUSCULAR | Status: AC
  Filled 2015-07-23: qty 2

## 2015-07-23 MED ORDER — FENTANYL CITRATE (PF) 100 MCG/2ML IJ SOLN
INTRAMUSCULAR | Status: AC
Start: 2015-07-23 — End: 2015-07-23
  Filled 2015-07-23: qty 2

## 2015-07-23 MED ORDER — GLYCOPYRROLATE 0.2 MG/ML IJ SOLN
INTRAMUSCULAR | Status: AC
  Filled 2015-07-23: qty 1

## 2015-07-23 MED ORDER — LACTATED RINGERS IV SOLN
INTRAVENOUS | Status: DC
Start: 2015-07-23 — End: 2015-07-23
  Administered 2015-07-23: 10:00:00 via INTRAVENOUS

## 2015-07-23 MED ORDER — GLYCOPYRROLATE 0.2 MG/ML IJ SOLN
INTRAMUSCULAR | Status: DC | PRN
  Administered 2015-07-23: 0.2 mg via INTRAVENOUS

## 2015-07-23 MED ORDER — ONDANSETRON HCL 4 MG/2ML IJ SOLN
4.0000 mg | Freq: Once | INTRAMUSCULAR | Status: AC
  Administered 2015-07-23: 4 mg via INTRAVENOUS

## 2015-07-23 MED ORDER — BUPIVACAINE HCL (PF) 0.5 % IJ SOLN
INTRAMUSCULAR | Status: DC | PRN
  Administered 2015-07-23: 4 mL
  Administered 2015-07-23: 5 mL

## 2015-07-23 MED ORDER — VANCOMYCIN HCL 10 G IV SOLR
1500.0000 mg | INTRAVENOUS | Status: AC
  Administered 2015-07-23: 1500 mg via INTRAVENOUS
  Filled 2015-07-23: qty 1500

## 2015-07-23 MED ORDER — MIDAZOLAM HCL 2 MG/2ML IJ SOLN
1.0000 mg | INTRAMUSCULAR | Status: DC | PRN
  Administered 2015-07-23: 2 mg via INTRAVENOUS

## 2015-07-23 MED ORDER — FENTANYL CITRATE (PF) 100 MCG/2ML IJ SOLN
25.0000 ug | INTRAMUSCULAR | Status: DC | PRN
  Administered 2015-07-23 (×2): 50 ug via INTRAVENOUS
  Filled 2015-07-23: qty 2

## 2015-07-23 MED ORDER — FENTANYL CITRATE (PF) 100 MCG/2ML IJ SOLN
25.0000 ug | INTRAMUSCULAR | Status: AC
  Administered 2015-07-23 (×2): 25 ug via INTRAVENOUS

## 2015-07-23 MED ORDER — ONDANSETRON HCL 4 MG/2ML IJ SOLN
INTRAMUSCULAR | Status: DC | PRN
  Administered 2015-07-23: 4 mg via INTRAVENOUS

## 2015-07-23 MED ORDER — 0.9 % SODIUM CHLORIDE (POUR BTL) OPTIME
TOPICAL | Status: DC | PRN
  Administered 2015-07-23: 1000 mL

## 2015-07-23 SURGICAL SUPPLY — 31 items
APPLIER CLIP 13 LRG OPEN (CLIP) ×2
BAG HAMPER (MISCELLANEOUS) ×2 IMPLANT
CHLORAPREP W/TINT 26ML (MISCELLANEOUS) ×2 IMPLANT
CLIP APPLIE 13 LRG OPEN (CLIP) ×1 IMPLANT
CLOTH BEACON ORANGE TIMEOUT ST (SAFETY) ×2 IMPLANT
COVER LIGHT HANDLE STERIS (MISCELLANEOUS) ×4 IMPLANT
DECANTER SPIKE VIAL GLASS SM (MISCELLANEOUS) ×4 IMPLANT
DERMABOND ADVANCED (GAUZE/BANDAGES/DRESSINGS) ×1
DERMABOND ADVANCED .7 DNX12 (GAUZE/BANDAGES/DRESSINGS) ×1 IMPLANT
ELECT REM PT RETURN 9FT ADLT (ELECTROSURGICAL) ×2
ELECTRODE REM PT RTRN 9FT ADLT (ELECTROSURGICAL) ×1 IMPLANT
GLOVE BIO SURGEON STRL SZ7 (GLOVE) ×2 IMPLANT
GLOVE BIOGEL PI IND STRL 7.0 (GLOVE) ×3 IMPLANT
GLOVE BIOGEL PI IND STRL 7.5 (GLOVE) ×1 IMPLANT
GLOVE BIOGEL PI INDICATOR 7.0 (GLOVE) ×3
GLOVE BIOGEL PI INDICATOR 7.5 (GLOVE) ×1
GLOVE ECLIPSE 7.0 STRL STRAW (GLOVE) ×4 IMPLANT
GOWN STRL REUS W/TWL LRG LVL3 (GOWN DISPOSABLE) ×6 IMPLANT
KIT ROOM TURNOVER APOR (KITS) ×2 IMPLANT
MANIFOLD NEPTUNE II (INSTRUMENTS) ×2 IMPLANT
NEEDLE HYPO 25X1 1.5 SAFETY (NEEDLE) ×2 IMPLANT
NS IRRIG 1000ML POUR BTL (IV SOLUTION) ×2 IMPLANT
PACK MINOR (CUSTOM PROCEDURE TRAY) ×2 IMPLANT
PAD ARMBOARD 7.5X6 YLW CONV (MISCELLANEOUS) ×2 IMPLANT
SET BASIN LINEN APH (SET/KITS/TRAYS/PACK) ×2 IMPLANT
SUT MNCRL AB 4-0 PS2 18 (SUTURE) ×2 IMPLANT
SUT SILK 3 0 (SUTURE) ×1
SUT SILK 3-0 18XBRD TIE 12 (SUTURE) ×1 IMPLANT
SUT VIC AB 3-0 SH 27 (SUTURE) ×1
SUT VIC AB 3-0 SH 27X BRD (SUTURE) ×1 IMPLANT
SYR BULB IRRIGATION 50ML (SYRINGE) ×2 IMPLANT

## 2015-07-23 NOTE — Op Note (Signed)
SURGICAL OPERATIVE REPORT   ATTENDING SURGEON: [name], MD  ASSISTANT(S): None   ANESTHESIA: GETA   PRE-OPERATIVE DIAGNOSIS: Left axillary lymphadenopathy   POST-OPERATIVE DIAGNOSIS: Left axillary lymphadenopathy   PROCEDURE(S):  1.) Excisional biopsy of large Left axillary lymph node (6 cm x 4 cm x 4 cm)   INTRAOPERATIVE FINDINGS: Multiple enlarged lymph nodes of the anterior Left axilla just slightly superficial to ribs   INTRAVENOUS FLUIDS: 1600 mL crystalloid   ESTIMATED BLOOD LOSS: Minimal (< 20 mL)   URINE OUTPUT: No foley   SPECIMENS: Large Left axillary lymph node (6 cm x 4 cm x 4 cm)   IMPLANTS: None   DRAINS: None   COMPLICATIONS: None apparent   CONDITION AT END OF PROCEDURE: Hemodynamically stable and extubated   DISPOSITION OF PATIENT: PACU  INDICATIONS FOR PROCEDURE:   Patient is a 60 year old Female who presented with Left axillary lymphadenopathy x 3 months. Mammogram did not reveal any additional findings, and FNA of the Left axillary lymph node was non-diagnostic, but suspicious/concerning for lymphoma. Accordingly, surgical excision was requested for pathology analysis. All risks, benefits, and alternatives to above procedure were discussed with the patient, all of patient's questions were answered to her expressed satisfaction, and informed consent was accordingly obtained and documented.  DETAILS OF PROCEDURE: Patient was brought to the operative suite and appropriately identified. General anesthesia was administered along with appropriate pre-operative antibiotics, and endotracheal intubation was performed by anesthetist. In supine position, operative site was prepped and draped in the usual sterile fashion, and following a brief time out, a 4 cm incision was made along patient's natural skin crease. Blunt dissection over the region of the palpable mass revealed a large firm mass that initially seemed to be closely associated with the  underlying rib within the anterior axilla. However, upon further dissection, the mass was able to be safely mobilized and dissected free from surrounding tissues. Ultimately, the vascular pedicle was able to be isolated, and a hemostat was placed across its base. The lymphovascular pedicle was then divided, the lymph node was handed off the field fresh for flow cytometry and pathology analysis, and the lymphovascular pedicle was securely clipped. Further anterior to the first significantly enlarged lymph node along the same rib was an additional abnormally enlarged lymph node that was not able to be reached through the existing incision, and excision of additional tissue did not appear to be necessary for the purpose of pathologic diagnosis. Accordingly, the incision was irrigated, hemostasis was confirmed, and the incision was re-approximated in layers with 3-0 Vicryl for soft tissues and dermis, while 4-0 Monocryl was used to re-approximate skin. The skin was then cleaned, dried, and sterile Dermabond skin glue was applied and allowed to dry.  Patient was then safely able to be extubated, awaken, and transferred to PACU for post-operative monitoring and care.  I was present for all aspects of the above procedure, and there were no complications apparent.

## 2015-07-23 NOTE — Anesthesia Postprocedure Evaluation (Signed)
Anesthesia Post Note  Patient: Holly Stephens  Procedure(s) Performed: Procedure(s) (LRB): EXCISIONAL BIOPSY LEFT AXILLARY LYMPH NODE (Left)  Patient location during evaluation: PACU Anesthesia Type: General Level of consciousness: sedated, patient cooperative and responds to stimulation Pain management: pain level controlled Vital Signs Assessment: post-procedure vital signs reviewed and stable Respiratory status: spontaneous breathing and non-rebreather facemask Cardiovascular status: stable Anesthetic complications: no    Last Vitals:  Filed Vitals:   07/23/15 0950 07/23/15 0955  BP: 128/67   Pulse:    Temp:    Resp: 15 12    Last Pain: There were no vitals filed for this visit.               Tally Mattox

## 2015-07-23 NOTE — Interval H&P Note (Signed)
History and Physical Interval Note:  07/23/2015 9:50 AM  Holly Stephens  has presented today for surgery, with the diagnosis of left axillary lymphadenopathy  The various methods of treatment have been discussed with the patient and family. After consideration of risks, benefits and other options for treatment, the patient has consented to  Procedure(s): EXCISIONAL BIOPSY LEFT AXILLARY LYMPH NODE (Left) as a surgical intervention .  The patient's history has been reviewed, patient examined, no change in status, stable for surgery.  I have reviewed the patient's chart and labs.  Questions were answered to the patient's satisfaction.     [name]

## 2015-07-23 NOTE — Discharge Instructions (Signed)
Use Tylenol for pain as needed Keep dressing clean & dry for 2 days Make appointment for follow up in 2 weeks Notify MD if incision is reddened, has foul odor, or starts to separate or open up

## 2015-07-23 NOTE — Transfer of Care (Signed)
Immediate Anesthesia Transfer of Care Note  Patient: Holly Stephens  Procedure(s) Performed: Procedure(s): EXCISIONAL BIOPSY LEFT AXILLARY LYMPH NODE (Left)  Patient Location: PACU  Anesthesia Type:General  Level of Consciousness: sedated  Airway & Oxygen Therapy: Patient Spontanous Breathing and Patient connected to face mask oxygen  Post-op Assessment: Report given to RN, Post -op Vital signs reviewed and stable and Patient moving all extremities X 4  Post vital signs: Reviewed and stable  Last Vitals:  Filed Vitals:   07/23/15 0950 07/23/15 0955  BP: 128/67   Pulse:    Temp:    Resp: 15 12    Last Pain: There were no vitals filed for this visit.    Patients Stated Pain Goal: 5 ([id number])  Complications: No apparent anesthesia complications

## 2015-07-23 NOTE — Anesthesia Preprocedure Evaluation (Addendum)
Anesthesia Evaluation  Patient identified by MRN, date of birth, ID band Patient awake    Reviewed: Allergy & Precautions, NPO status , Patient's Chart, lab work & pertinent test results, reviewed documented beta blocker date and time   Airway Mallampati: II  TM Distance: >3 FB     Dental  (+) Teeth Intact   Pulmonary neg pulmonary ROS,    breath sounds clear to auscultation       Cardiovascular hypertension, Pt. on home beta blockers and Pt. on medications  Rhythm:Regular Rate:Normal     Neuro/Psych PSYCHIATRIC DISORDERS Depression    GI/Hepatic negative GI ROS,   Endo/Other  diabetes, Type 2Morbid obesity  Renal/GU      Musculoskeletal   Abdominal   Peds  Hematology   Anesthesia Other Findings lymphadenopathy suspicious for lymphoma [name]   Reproductive/Obstetrics                            Anesthesia Physical Anesthesia Plan  ASA: III  Anesthesia Plan: General   Post-op Pain Management:    Induction: Intravenous  Airway Management Planned: LMA  Additional Equipment:   Intra-op Plan:   Post-operative Plan: Extubation in OR  Informed Consent: I have reviewed the patients History and Physical, chart, labs and discussed the procedure including the risks, benefits and alternatives for the proposed anesthesia with the patient or authorized representative who has indicated his/her understanding and acceptance.     Plan Discussed with:   Anesthesia Plan Comments:         Anesthesia Quick Evaluation

## 2015-07-23 NOTE — Anesthesia Procedure Notes (Addendum)
Procedure Name: LMA Insertion Date/Time: 07/23/2015 10:09 AM Performed by: Gershon Mussel, Jocelyne Reinertsen Pre-anesthesia Checklist: Patient identified, Patient being monitored, Timeout performed, Emergency Drugs available and Suction available Patient Re-evaluated:Patient Re-evaluated prior to inductionOxygen Delivery Method: Circle system utilized Preoxygenation: Pre-oxygenation with 100% oxygen Intubation Type: IV induction Ventilation: Mask ventilation without difficulty LMA Size: 4.0 Number of attempts: 1 Placement Confirmation: positive ETCO2 and breath sounds checked- equal and bilateral Tube secured with: Tape Dental Injury: Teeth and Oropharynx as per pre-operative assessment

## 2015-07-26 ENCOUNTER — Encounter (HOSPITAL_COMMUNITY): Payer: Self-pay | Admitting: Surgery

## 2015-07-27 ENCOUNTER — Ambulatory Visit: Payer: 59 | Admitting: Internal Medicine

## 2015-08-23 ENCOUNTER — Other Ambulatory Visit (HOSPITAL_COMMUNITY): Payer: Self-pay | Admitting: Oncology

## 2015-08-23 DIAGNOSIS — C8110 Nodular sclerosis classical Hodgkin lymphoma, unspecified site: Secondary | ICD-10-CM

## 2015-08-23 DIAGNOSIS — C8114 Nodular sclerosis classical Hodgkin lymphoma, lymph nodes of axilla and upper limb: Secondary | ICD-10-CM

## 2015-08-23 DIAGNOSIS — C8118 Nodular sclerosis classical Hodgkin lymphoma, lymph nodes of multiple sites: Secondary | ICD-10-CM | POA: Insufficient documentation

## 2015-08-23 HISTORY — DX: Nodular sclerosis Hodgkin lymphoma, unspecified site: C81.10

## 2015-08-27 ENCOUNTER — Encounter: Payer: Self-pay | Admitting: Podiatry

## 2015-08-27 ENCOUNTER — Ambulatory Visit (INDEPENDENT_AMBULATORY_CARE_PROVIDER_SITE_OTHER): Payer: 59 | Admitting: Podiatry

## 2015-08-27 DIAGNOSIS — B351 Tinea unguium: Secondary | ICD-10-CM | POA: Diagnosis not present

## 2015-08-27 DIAGNOSIS — M79676 Pain in unspecified toe(s): Secondary | ICD-10-CM | POA: Diagnosis not present

## 2015-08-27 DIAGNOSIS — E119 Type 2 diabetes mellitus without complications: Secondary | ICD-10-CM | POA: Diagnosis not present

## 2015-08-27 NOTE — Progress Notes (Signed)
Patient ID: Holly Stephens, female   DOB: Dec 04, 1955, 60 y.o.   MRN: [id number] Complaint:  Visit Type: Patient returns to my office for continued preventative foot care services. Complaint: Patient states" my nails have grown long and thick and become painful to walk and wear shoes" Patient has been diagnosed with DM with no foot complications. The patient presents for preventative foot care services. No changes to ROS  Podiatric Exam: Vascular: dorsalis pedis and posterior tibial pulses are palpable bilateral. Capillary return is immediate. Temperature gradient is WNL. Skin turgor WNL  Sensorium: Normal Semmes Weinstein monofilament test. Normal tactile sensation bilaterally. Nail Exam: Pt has thick disfigured discolored nails with subungual debris noted bilateral entire nail hallux through fifth toenails Ulcer Exam: There is no evidence of ulcer or pre-ulcerative changes or infection. Orthopedic Exam: Muscle tone and strength are WNL. No limitations in general ROM. No crepitus or effusions noted. Foot type and digits show no abnormalities. Bony prominences are unremarkable. Skin: No Porokeratosis. No infection or ulcers  Diagnosis:  Onychomycosis, , Pain in right toe, pain in left toes  Treatment & Plan Procedures and Treatment: Consent by patient was obtained for treatment procedures. The patient understood the discussion of treatment and procedures well. All questions were answered thoroughly reviewed. Debridement of mycotic and hypertrophic toenails, 1 through 5 bilateral and clearing of subungual debris. No ulceration, no infection noted.  Return Visit-Office Procedure: Patient instructed to return to the office for a follow up visit 3 months for continued evaluation and treatment.   Gardiner Barefoot DPM

## 2015-08-31 ENCOUNTER — Ambulatory Visit (HOSPITAL_COMMUNITY)
Admission: RE | Admit: 2015-08-31 | Discharge: 2015-08-31 | Disposition: A | Discharge status: Home / Self Care | Payer: 59 | Source: Ambulatory Visit | Attending: Oncology | Admitting: Oncology

## 2015-08-31 ENCOUNTER — Encounter (HOSPITAL_COMMUNITY): Payer: Self-pay | Admitting: Oncology

## 2015-08-31 DIAGNOSIS — R590 Localized enlarged lymph nodes: Secondary | ICD-10-CM | POA: Insufficient documentation

## 2015-08-31 DIAGNOSIS — C8114 Nodular sclerosis classical Hodgkin lymphoma, lymph nodes of axilla and upper limb: Secondary | ICD-10-CM

## 2015-08-31 DIAGNOSIS — R918 Other nonspecific abnormal finding of lung field: Secondary | ICD-10-CM | POA: Diagnosis not present

## 2015-08-31 DIAGNOSIS — K429 Umbilical hernia without obstruction or gangrene: Secondary | ICD-10-CM | POA: Diagnosis not present

## 2015-08-31 LAB — GLUCOSE, CAPILLARY: GLUCOSE-CAPILLARY: 145 mg/dL — AB (ref 65–99)

## 2015-08-31 MED ORDER — FLUDEOXYGLUCOSE F - 18 (FDG) INJECTION
14.6300 | Freq: Once | INTRAVENOUS | Status: AC | PRN
  Administered 2015-08-31: 14.63 via INTRAVENOUS

## 2015-08-31 NOTE — Assessment & Plan Note (Addendum)
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  Oncology history developed.  Staging in [facility] problem list is completed.  I personally reviewed and went over pathology results with the patient.  Biopsy by Dr. Rosana Hoes, Gen Surg, of left axillary lymph node provides data confirming aforementioned malignancy.  This procedure was performed on 07/23/2015, but unfortunately there was a delay in referral to medical oncology.  I personally reviewed and went over radiographic studies with the patient.  The results are noted within this dictation.  PET scan provides information and staging data.  She underwent PFT and MUGA testing today in preparation for upcoming chemotherapy.  Labs today: CBC diff, CMET, LDH, ESR, CRP, hepatitis B testing.  I personally reviewed and went over laboratory results with the patient.  The results are noted within this dictation. CD4 count in April 2017 is 790.  Hepatitis C testing was ordered today, but unfortunately blood sampling was contaminated and therefore order was cancelled.  Will re-order the test for her next appointment.  Will refer the patient to Dr. Rosana Hoes for port placement ASAP for upcoming systemic chemotherapy.  I have discussed the patient's case with Dr. Rosana Hoes and he will see her in the office on Tuesday.  She will need ABVD systemic chemotherapy.  Two cycles will be administered followed by repeat PET imaging which will guide medical oncology recommendations.    She will need chemotherapy teaching as well for ABVD.  Oncology Nurse Navigator met with the patient today for introduction.    Treatment plan for ABVD is built.  The following medications have been sent to her mail pharmacy pre-emptively in an attempt to  get these medications to the patient prior to cycle 1 of chemotherapy:  Zofran  Compazine  Ativan  EMLA cream  The patient is advised to file for FMLA in preparation for chemotherapy.  I have messaged Dr. Megan Salon (ID) to inform him of the patient's recent diagnosis.    Return either day 1 of cycle 1 or approximately 7-10 after day 1 cycle 1 of treatment.  I will plan on starting chemotherapy in ~ 1 weeks time.

## 2015-08-31 NOTE — Progress Notes (Signed)
[facility] Hematology/Oncology Consultation   Name: Holly Stephens      MRN: [id number]   Date: 09/01/2015 Time:11:25 PM   REFERRING PHYSICIAN:  [name], MD (PCP)  REASON FOR CONSULT:  Hodgkin  lymphoma   DIAGNOSIS:  Biopsy proven, nodular sclerosing, classical [name]  HISTORY OF PRESENT ILLNESS:   Holly Stephens is a 60 y.o. female with a medical history significant for HIV positivity followed by Dr Megan Salon (ID), hyperlipidemia, morbid obesity, depression, HTN who is referred to the [facility] for biopsy-proven, nodular sclerosing, classical Hodgkin's Lymphoma.  Her chart is reviewed in great detail.  She presented to her primary care provider, Dr. Nevada Crane, in April 2017 for osteoarthritic complaints.  She notes that in passing, she mentioned a left axillary mass.  She was referred for mammogram and [place].  [place] of left axilla was abnormal and therefore a biopsy was performed.  This biopsy was concerning for a lymphoid malignancy resulting in a referral to Dr. Rosana Hoes (Gen Surg).  Dr. Rosana Hoes performed an excisional lymph node biopsy at the beginning of June 2017 that was positive for Hodgkin's Lymphoma.  Unfortunately, her referral to medical oncology was delayed.  Upon receiving the referral, the patient was set-up for PET scan, PFTs, and MUGA.  These are all completed as of today.  I personally reviewed and went over laboratory results with the patient.  The results are noted within this dictation.  I personally reviewed and went over radiographic studies with the patient.  The results are noted within this dictation.    She denies B symptoms. She presents alone today. She is widowed. She notes that she has a good support system however overall. She works full time.   She follows with Dr. Megan Salon routinely.     Review of Systems  Constitutional: Negative for fever, chills and weight loss.       Intermittent night sweats, chronic.  Rarely  drenching night sweats.  HENT: Negative for hearing loss.   Eyes: Negative.  Negative for blurred vision and double vision.  Respiratory: Negative.  Negative for cough and hemoptysis.   Cardiovascular: Negative for chest pain and palpitations.  Gastrointestinal: Negative.  Negative for heartburn, nausea and vomiting.  Genitourinary: Negative.   Musculoskeletal: Negative.   Skin: Negative.   Neurological: Negative for weakness and headaches.  Endo/Heme/Allergies: Negative.   Psychiatric/Behavioral: Negative.  Negative for depression. The patient is not nervous/anxious.      PAST MEDICAL HISTORY:   Past Medical History  Diagnosis Date  . HIV infection ([place])   . Diabetes mellitus without complication ([place])   . Arthritis     osteoarthritis  . [name], nodular sclerosis ([place]) 08/23/2015    ALLERGIES: Allergies  Allergen Reactions  . Augmentin [Amoxicillin-Pot Clavulanate] Hives    Caused  hives      MEDICATIONS: I have reviewed the patient's current medications.    Current Outpatient Prescriptions on File Prior to Visit  Medication Sig Dispense Refill  . acetaminophen (TYLENOL) 650 MG CR tablet Take 650 mg by mouth every 8 (eight) hours as needed for pain.    Marland Kitchen b complex vitamins capsule Take 1 capsule by mouth daily.    . calcium carbonate (OSCAL) 1500 (600 Ca) MG TABS tablet Take 600 mg of elemental calcium by mouth daily with breakfast.    . cholecalciferol (VITAMIN D) 1000 units tablet Take 2,000 Units by mouth daily.    . Darunavir  Ethanolate ([name]) 800 MG tablet Take 1 tablet (800 mg total) by mouth daily with breakfast. 90 tablet 3  . Diclofenac Sodium (PENNSAID) 2 % SOLN Place 2 application onto the skin 2 (two) times daily.    Marland Kitchen elvitegravir-cobicistat-emtricitabine-tenofovir ([name]) 150-150-200-10 MG TABS tablet Take 1 tablet by mouth daily with breakfast. 90 tablet 3  . folic acid (FOLVITE) 161 MCG tablet Take 1 tablet (800 mcg total) by mouth daily. 60  tablet 0  . Garlic 0960 MG CAPS Take by mouth. Reported on 06/15/2015    . JANUMET 50-500 MG per tablet Take 1 tablet by mouth 2 (two) times daily with a meal.     . metoprolol (LOPRESSOR) 50 MG tablet TAKE 1 TABLET DAILY 90 tablet 3  . Multiple Vitamin (MULTIVITAMIN) tablet Take 1 tablet by mouth daily.      . naproxen sodium (ANAPROX) 220 MG tablet Take 440 mg by mouth 2 (two) times daily with a meal.    . omega-3 fish oil (MAXEPA) 1000 MG CAPS capsule Take 1 capsule (1,000 mg total) by mouth daily. 90 each   . TURMERIC PO Take 2 tablets by mouth 3 (three) times daily.     Current Facility-Administered Medications on File Prior to Visit  Medication Dose Route Frequency Provider Last Rate Last Dose  . heparin lock flush 100 UNIT/ML injection              PAST SURGICAL HISTORY Past Surgical History  Procedure Laterality Date  . Shoulder surgery Right     removal of bone spur  . Axillary lymph node biopsy Left 07/23/2015    Procedure: EXCISIONAL BIOPSY LEFT AXILLARY LYMPH NODE;  Surgeon: [name], MD;  Location: [facility];  Service: General;  Laterality: Left;    FAMILY HISTORY: History reviewed. No pertinent family history.  SOCIAL HISTORY:  reports that she has never smoked. She has never used smokeless tobacco. She reports that she does not drink alcohol or use illicit drugs.  Social History   Social History  . Marital Status: Widowed    Spouse Name: N/A  . Number of Children: N/A  . Years of Education: N/A   Social History Main Topics  . Smoking status: Never Smoker   . Smokeless tobacco: Never Used  . Alcohol Use: No  . Drug Use: No  . Sexual Activity: No     Comment: declined condoms   Other Topics Concern  . None   Social History Narrative    PERFORMANCE STATUS: The patient's performance status is 1 - Symptomatic but completely ambulatory  PHYSICAL EXAM: Most Recent Vital Signs: Blood pressure 143/67, pulse 72, temperature 97.9 F (36.6 C), resp. rate  18, height _0  (1.549 m), weight 297 lb 6.4 oz (134.9 kg), SpO2 98 %. General appearance: alert, cooperative, appears stated age, no distress, morbidly obese and smiling, unaccompanied Head: Normocephalic, without obvious abnormality, atraumatic Eyes: negative findings: lids and lashes normal, conjunctivae and sclerae normal and corneas clear Throat: lips, mucosa, and tongue normal; teeth and gums normal Neck: supple, symmetrical, trachea midline and thyroid not enlarged, symmetric, no tenderness/mass/nodules Lungs: clear to auscultation bilaterally and normal percussion bilaterally Heart: regular rate and rhythm, S1, S2 normal, no murmur, click, rub or gallop Abdomen: normal findings: no organomegaly, soft, non-tender and spleen non-palpable and abnormal findings:  obese Extremities: extremities normal, atraumatic, no cyanosis or edema Skin: Skin color, texture, turgor normal. No rashes or lesions Lymph nodes: multiple left supraclavicular lymph nodes, multiple left axillary lymph  nodes Neurologic: Grossly normal  LABORATORY DATA:  Results for orders placed or performed in visit on 09/01/15 (from the past 48 hour(s))  CBC with Differential     Status: Abnormal   Collection Time: 09/01/15  5:15 PM  Result Value Ref Range   WBC 11.9 (H) 4.0 - 10.5 K/uL   RBC 4.63 3.87 - 5.11 MIL/uL   Hemoglobin 13.9 12.0 - 15.0 g/dL   HCT 42.4 36.0 - 46.0 %   MCV 91.6 78.0 - 100.0 fL   MCH 30.0 26.0 - 34.0 pg   MCHC 32.8 30.0 - 36.0 g/dL   RDW 14.3 11.5 - 15.5 %   Platelets 248 150 - 400 K/uL   Neutrophils Relative % 72 %   Neutro Abs 8.6 (H) 1.7 - 7.7 K/uL   Lymphocytes Relative 21 %   Lymphs Abs 2.5 0.7 - 4.0 K/uL   Monocytes Relative 6 %   Monocytes Absolute 0.7 0.1 - 1.0 K/uL   Eosinophils Relative 1 %   Eosinophils Absolute 0.2 0.0 - 0.7 K/uL   Basophils Relative 0 %   Basophils Absolute 0.0 0.0 - 0.1 K/uL  Lactate dehydrogenase     Status: Abnormal   Collection Time: 09/01/15  5:15 PM    Result Value Ref Range   LDH 395 (H) 98 - 192 U/L  C-reactive protein     Status: Abnormal   Collection Time: 09/01/15  5:15 PM  Result Value Ref Range   CRP 4.3 (H) <1.0 mg/dL    Comment: Performed at [facility]  Comprehensive metabolic panel     Status: Abnormal   Collection Time: 09/01/15  5:15 PM  Result Value Ref Range   Sodium 138 135 - 145 mmol/L   Potassium 3.7 3.5 - 5.1 mmol/L   Chloride 104 101 - 111 mmol/L   CO2 26 22 - 32 mmol/L   Glucose, Bld 100 (H) 65 - 99 mg/dL   BUN 14 6 - 20 mg/dL   Creatinine, Ser 0.58 0.44 - 1.00 mg/dL   Calcium 9.5 8.9 - 10.3 mg/dL   Total Protein 7.7 6.5 - 8.1 g/dL   Albumin 3.7 3.5 - 5.0 g/dL   AST 34 15 - 41 U/L   ALT 26 14 - 54 U/L   Alkaline Phosphatase 59 38 - 126 U/L   Total Bilirubin 0.5 0.3 - 1.2 mg/dL   GFR calc non Af Amer >60 >60 mL/min   GFR calc Af Amer >60 >60 mL/min    Comment: (NOTE) The eGFR has been calculated using the CKD EPI equation. This calculation has not been validated in all clinical situations. eGFR's persistently <60 mL/min signify possible Chronic Kidney Disease.    Anion gap 8 5 - 15      RADIOGRAPHY: Nm Cardiac Muga Rest  09/01/2015  CLINICAL DATA:  Nodular sclerosing Hodgkin's lymphoma of axillary lymph nodes, planned Adriamycin chemotherapy EXAM: NUCLEAR MEDICINE CARDIAC BLOOD POOL IMAGING (MUGA) TECHNIQUE: Cardiac multi-gated acquisition was performed at rest following intravenous injection of Tc-67mlabeled red blood cells. RADIOPHARMACEUTICALS:  25 mCi Tc-974mn-vitro labeled red blood cells IV COMPARISON:  None FINDINGS: Calculated LEFT ventricular ejection fraction is 73%, within the normal range. Normal left ventricular wall motion on cine analysis of the gated blood pool in 3 projections. IMPRESSION: Normal LEFT ventricular ejection fraction of 73%. Normal LEFT ventricular wall motion. Electronically Signed   By: MaLavonia Dana.D.   On: 09/01/2015 14:53   Nm Pet Image Initial (pi) Skull  Base To Thigh  08/31/2015  CLINICAL DATA:  Initial treatment strategy for Hodgkin's lymphoma. Additional history of HIV infection. EXAM: NUCLEAR MEDICINE PET SKULL BASE TO THIGH TECHNIQUE: 14.6 mCi F-18 FDG was injected intravenously. Full-ring PET imaging was performed from the skull base to thigh after the radiotracer. CT data was obtained and used for attenuation correction and anatomic localization. FASTING BLOOD GLUCOSE:  Value: 145 mg/dl COMPARISON:  None. FINDINGS: NECK Hypermetabolic LEFT supraclavicular lymph nodes with SUV max equal 8.2. CHEST Bulky hypermetabolic LEFT axillary lymph nodes measure up to 2.8 cm short axis with SUV max equal 12.2. Smaller hypermetabolic RIGHT axillary nodes. Hypermetabolic mediastinal lymph nodes and RIGHT hilar lymph nodes. internal mammary lymph node on the LEFT Measures 12 mm (image 57 series 4) with SUV max equal 10.0. Subcarinal lymph node SUV max equal 11.8. LEFT lower lobe nodule measures 7 mm (image 32, series 6) without clear metabolic activity. RIGHT upper lobe nodule measures 8 mm (image 25 series 6) also without clear metabolic activity. ABDOMEN/PELVIS Bulky periportal and periaortic retroperitoneal hypermetabolic lymph nodes. Periportal lymph node with SUV max equals 12.2 and LEFT periaortic lymph node with SUV max equal 9.5. Adenopathy extends into the LEFT external iliac and inguinal lymph nodes. Hypermetabolic LEFT inguinal lymph node measures 2.0 cm short axis with SUV max equal 18.5. There is a large well-circumscribed fat lesion within the subcutaneous tissue in the deep to the umbilicus. This connects with the peritoneal space and most consistent 10 cm umbilical hernia. The hernia contains only fat. Mild peripheral stranding. SKELETON Scattered moderately hypermetabolic marrow activity without focal lesion. IMPRESSION: 1. Hypermetabolic lymphadenopathy consistent lymphoma. Nodal stations include the LEFT supraclavicular nodes, LEFT axillary nodes,  mediastinal lymph nodes, periaortic retroperitoneal and periportal lymph nodes as well as LEFT iliac and inguinal lymph nodes. 2. Two pulmonary nodules while not hypermetabolic are concerning for lymphoma involvement of the lung parenchyma. 3. No convincing evidence of bone marrow involvement. Mild heterogeneous metabolic marrow activity. 4. Incidental note of large periumbilical hernia. Electronically Signed   By: Suzy Bouchard M.D.   On: 08/31/2015 09:18       PATHOLOGY:    Diagnosis Lymph node for lymphoma, left axillary - CLASSICAL [name], NODULAR SCLEROSIS TYPE.   ASSESSMENT/PLAN:  [name], nodular sclerosis (HCC) Stage IIIA  Obesity HIV  Hodgkin's Lymphoma, nodular sclerosing-type, classical Hodgkin's Disease with lymphatic involvement in multiple lymph nodes superior and inferior to the diaphragm with PET noting two pulmonary nodules not hypermetabolic. (will be followed closely) Oncology history developed.  Staging in [facility] problem list is completed.  I personally reviewed and went over pathology results with the patient.  Biopsy by Dr. Rosana Hoes, Gen Surg, of left axillary lymph node provides data confirming malignancy.  This procedure was performed on 07/23/2015, but unfortunately there was a delay in referral to medical oncology.  I personally reviewed and went over radiographic studies with the patient.  The results are noted within this dictation.  PET scan provides information and staging data.  She underwent PFT and MUGA testing today in preparation for upcoming chemotherapy.  Labs today: CBC diff, CMET, LDH, ESR, CRP, hepatitis B testing.  I personally reviewed and went over laboratory results with the patient.  The results are noted within this dictation. CD4 count in April 2017 is 790.  Hepatitis C testing was ordered today, but unfortunately blood sampling was contaminated and therefore order was cancelled.  Will re-order the test for her next  appointment.  Will refer the patient  to Dr. Rosana Hoes for port placement ASAP for upcoming systemic chemotherapy.  I have discussed the patient's case with Dr. Rosana Hoes and he will see her in the office on Tuesday.  She will need ABVD systemic chemotherapy.  Two cycles will be administered followed by repeat PET imaging which will guide medical oncology recommendations.  We reviewed the NCCN guidelines in detail regarding therapy, workup and long term surveillance.   She will need chemotherapy teaching as well for ABVD.  The patient met with Anderson Malta our patient navigator today. She will be set up for formal chemotherapy teaching.    Treatment plan for ABVD is built.  The following medications have been sent to her mail pharmacy pre-emptively in an attempt to get these medications to the patient prior to cycle 1 of chemotherapy:  Zofran  Compazine  Ativan  EMLA cream  The patient is advised to file for FMLA in preparation for chemotherapy.  I have messaged Dr. Megan Salon (ID) to inform him of the patient's recent diagnosis.  I advised the patient that in well controlled HIV standard therapy for [name] is indicated and most patients have a favorable prognosis.   Return either day 1 of cycle 1 or approximately 7-10 after day 1 cycle 1 of treatment.  I will plan on starting chemotherapy in ~ 1 weeks time.    ORDERS PLACED FOR THIS ENCOUNTER: Orders Placed This Encounter  Procedures  . CBC with Differential  . Comprehensive metabolic panel  . Lactate dehydrogenase  . Sedimentation rate  . C-reactive protein  . Hepatitis panel, acute  . Hepatitis B core antibody, IgM  . Hepatitis B surface antigen  . Hepatitis c vrs RNA detect by PCR-qual  . Hepatitis panel, acute    MEDICATIONS PRESCRIBED THIS ENCOUNTER: Meds ordered this encounter  Medications  . ondansetron (ZOFRAN) 8 MG tablet    Sig: Take 1 tablet (8 mg total) by mouth every 8 (eight) hours as needed for nausea or vomiting.     Dispense:  60 tablet    Refill:  0    90 day supply    Order Specific Question:  Supervising Provider    Answer:  Patrici Ranks [id number]  . prochlorperazine (COMPAZINE) 10 MG tablet    Sig: Take 1 tablet (10 mg total) by mouth every 6 (six) hours as needed for nausea or vomiting.    Dispense:  120 tablet    Refill:  0    90 day supply    Order Specific Question:  Supervising Provider    Answer:  Patrici Ranks [id number]  . lidocaine-prilocaine (EMLA) cream    Sig: Apply 1 application topically as needed.    Dispense:  90 g    Refill:  0    90 day supply. Three 30 gram tubes    Order Specific Question:  Supervising Provider    Answer:  Patrici Ranks [id number]  . LORazepam (ATIVAN) 0.5 MG tablet    Sig: Take 1 tablet (0.5 mg total) by mouth every 8 (eight) hours.    Dispense:  120 tablet    Refill:  0    90 day supply    Order Specific Question:  Supervising Provider    Answer:  Patrici Ranks [id number]    All questions were answered. The patient knows to call the clinic with any problems, questions or concerns. We can certainly see the patient much sooner if necessary.  This note is electronically signed [id number] Cyril Mourning, MD  09/01/2015  11:25 PM

## 2015-09-01 ENCOUNTER — Encounter (HOSPITAL_COMMUNITY)
Admission: RE | Admit: 2015-09-01 | Discharge: 2015-09-01 | Disposition: A | Discharge status: Home / Self Care | Payer: 59 | Source: Ambulatory Visit | Attending: Oncology | Admitting: Oncology

## 2015-09-01 ENCOUNTER — Encounter (HOSPITAL_COMMUNITY): Payer: 59 | Attending: Oncology | Admitting: Oncology

## 2015-09-01 ENCOUNTER — Encounter (HOSPITAL_COMMUNITY): Payer: Self-pay

## 2015-09-01 ENCOUNTER — Telehealth (HOSPITAL_COMMUNITY): Payer: Self-pay | Admitting: Oncology

## 2015-09-01 ENCOUNTER — Encounter (HOSPITAL_COMMUNITY): Payer: Self-pay | Admitting: Oncology

## 2015-09-01 ENCOUNTER — Ambulatory Visit (HOSPITAL_COMMUNITY)
Admission: RE | Admit: 2015-09-01 | Discharge: 2015-09-01 | Disposition: A | Discharge status: Home / Self Care | Payer: 59 | Source: Ambulatory Visit | Attending: Oncology | Admitting: Oncology

## 2015-09-01 VITALS — BP 143/67 | HR 72 | Temp 97.9°F | Resp 18 | Ht 61.0 in | Wt 297.4 lb

## 2015-09-01 DIAGNOSIS — B20 Human immunodeficiency virus [HIV] disease: Secondary | ICD-10-CM

## 2015-09-01 DIAGNOSIS — C8114 Nodular sclerosis classical Hodgkin lymphoma, lymph nodes of axilla and upper limb: Secondary | ICD-10-CM | POA: Diagnosis not present

## 2015-09-01 DIAGNOSIS — C8118 Nodular sclerosis classical Hodgkin lymphoma, lymph nodes of multiple sites: Secondary | ICD-10-CM | POA: Diagnosis not present

## 2015-09-01 DIAGNOSIS — E669 Obesity, unspecified: Secondary | ICD-10-CM | POA: Diagnosis not present

## 2015-09-01 LAB — COMPREHENSIVE METABOLIC PANEL
ALBUMIN: 3.7 g/dL (ref 3.5–5.0)
ALT: 26 U/L (ref 14–54)
AST: 34 U/L (ref 15–41)
Alkaline Phosphatase: 59 U/L (ref 38–126)
Anion gap: 8 (ref 5–15)
BILIRUBIN TOTAL: 0.5 mg/dL (ref 0.3–1.2)
BUN: 14 mg/dL (ref 6–20)
CO2: 26 mmol/L (ref 22–32)
Calcium: 9.5 mg/dL (ref 8.9–10.3)
Chloride: 104 mmol/L (ref 101–111)
Creatinine, Ser: 0.58 mg/dL (ref 0.44–1.00)
GFR calc Af Amer: >60 mL/min (ref >60)
GLUCOSE: 100 mg/dL — AB (ref 65–99)
Potassium: 3.7 mmol/L (ref 3.5–5.1)
Sodium: 138 mmol/L (ref 135–145)
TOTAL PROTEIN: 7.7 g/dL (ref 6.5–8.1)

## 2015-09-01 LAB — CBC WITH DIFFERENTIAL/PLATELET
Basophils Absolute: 0 10*3/uL (ref 0.0–0.1)
Basophils Relative: 0 %
Eosinophils Absolute: 0.2 10*3/uL (ref 0.0–0.7)
Eosinophils Relative: 1 %
HEMATOCRIT: 42.4 % (ref 36.0–46.0)
HEMOGLOBIN: 13.9 g/dL (ref 12.0–15.0)
LYMPHS ABS: 2.5 10*3/uL (ref 0.7–4.0)
LYMPHS PCT: 21 %
MCH: 30 pg (ref 26.0–34.0)
MCHC: 32.8 g/dL (ref 30.0–36.0)
MCV: 91.6 fL (ref 78.0–100.0)
MONOS PCT: 6 %
Monocytes Absolute: 0.7 10*3/uL (ref 0.1–1.0)
NEUTROS ABS: 8.6 10*3/uL — AB (ref 1.7–7.7)
NEUTROS PCT: 72 %
Platelets: 248 10*3/uL (ref 150–400)
RBC: 4.63 MIL/uL (ref 3.87–5.11)
RDW: 14.3 % (ref 11.5–15.5)
WBC: 11.9 10*3/uL — ABNORMAL HIGH (ref 4.0–10.5)

## 2015-09-01 LAB — LACTATE DEHYDROGENASE: LDH: 395 U/L — ABNORMAL HIGH (ref 98–192)

## 2015-09-01 LAB — C-REACTIVE PROTEIN: CRP: 4.3 mg/dL — ABNORMAL HIGH (ref <1.0)

## 2015-09-01 MED ORDER — ALBUTEROL SULFATE (2.5 MG/3ML) 0.083% IN NEBU
2.5000 mg | INHALATION_SOLUTION | Freq: Once | RESPIRATORY_TRACT | Status: AC
  Administered 2015-09-01: 2.5 mg via RESPIRATORY_TRACT

## 2015-09-01 MED ORDER — LIDOCAINE-PRILOCAINE 2.5-2.5 % EX CREA: 1.0000 "application " | TOPICAL_CREAM | CUTANEOUS | Status: DC | PRN

## 2015-09-01 MED ORDER — LORAZEPAM 0.5 MG PO TABS
0.5000 mg | ORAL_TABLET | Freq: Three times a day (TID) | ORAL | Status: DC
Start: 2015-09-01 — End: 2015-10-27

## 2015-09-01 MED ORDER — ONDANSETRON HCL 8 MG PO TABS: 8.0000 mg | ORAL_TABLET | Freq: Three times a day (TID) | ORAL | Status: DC | PRN

## 2015-09-01 MED ORDER — TECHNETIUM TC 99M-LABELED RED BLOOD CELLS IV KIT
25.0000 | PACK | Freq: Once | INTRAVENOUS | Status: AC | PRN
  Administered 2015-09-01: 25 via INTRAVENOUS

## 2015-09-01 MED ORDER — PROCHLORPERAZINE MALEATE 10 MG PO TABS: 10.0000 mg | ORAL_TABLET | Freq: Four times a day (QID) | ORAL | Status: DC | PRN

## 2015-09-01 MED ORDER — HEPARIN SOD (PORK) LOCK FLUSH 100 UNIT/ML IV SOLN
INTRAVENOUS | Status: AC
  Filled 2015-09-01: qty 5

## 2015-09-01 NOTE — Patient Instructions (Signed)
[place] at [facility] Discharge Instructions  RECOMMENDATIONS MADE BY THE CONSULTANT AND ANY TEST RESULTS WILL BE SENT TO YOUR REFERRING PHYSICIAN.  You were seen by Gershon Mussel today. Call your HR Department for FMLA instructions. Labs today. Will call Dr. Rosana Hoes to get port place. Chemo teaching to come Gershon Mussel will message Dr. Megan Salon Return for day 1 of treatment per schedule Prescriptions are sent to pharmacy.   Thank you for choosing [place] at [facility] to provide your oncology and hematology care.  To afford each patient quality time with our provider, please arrive at least 15 minutes before your scheduled appointment time.   Beginning January 23rd 2017 lab work for the [facility] will be done in the  Main lab at [facility] on 1st floor. If you have a lab appointment with the [place] please come in thru the  Main Entrance and check in at the main information desk  You need to re-schedule your appointment should you arrive 10 or more minutes late.  We strive to give you quality time with our providers, and arriving late affects you and other patients whose appointments are after yours.  Also, if you no show three or more times for appointments you may be dismissed from the clinic at the providers discretion.     Again, thank you for choosing [facility].  Our hope is that these requests will decrease the amount of time that you wait before being seen by our physicians.       _____________________________________________________________  Should you have questions after your visit to [facility], please contact our office at [phone] between the hours of 8:30 a.m. and 4:30 p.m.  Voicemails left after 4:30 p.m. will not be returned until the following business day.  For prescription refill requests, have your pharmacy contact our office.         Resources For Cancer Patients and their  Caregivers ? American Cancer Society: Can assist with transportation, wigs, general needs, runs Look Good Feel Better.        754 615 1741 ? Cancer Care: Provides financial assistance, online support groups, medication/co-pay assistance.  1-800-813-HOPE 602-025-5128) ? [place] Assists [place] cancer patients and their families through emotional , educational and financial support.  606-003-2535 ? [place] DSS Where to apply for food stamps, Medicaid and utility assistance. 332-021-6367 ? [facility]: Transportation to medical appointments. 516-234-3727 ? Social Security Administration: May apply for disability if have a Stage IV cancer. 605-440-8595 (434)259-3900 ? [employer], Disability and Transit Services: Assists with nutrition, care and transit needs. [place] Support Programs: @10RELATIVEDAYS @ > Cancer Support Group  2nd Tuesday of the month 1pm-2pm, Journey Room  > Creative Journey  3rd Tuesday of the month 1130am-1pm, Journey Room  > Look Good Feel Better  1st Wednesday of the month 10am-12 noon, Journey Room (Call [place] to register [phone])

## 2015-09-01 NOTE — Progress Notes (Signed)
Lab notified nursing staff that HCV PCR lab ordered this evening would need to be re-drawn as critical frozen. Notified [name], PA.

## 2015-09-02 ENCOUNTER — Other Ambulatory Visit (HOSPITAL_COMMUNITY): Payer: Self-pay | Admitting: Emergency Medicine

## 2015-09-02 LAB — HEPATITIS PANEL, ACUTE
HCV Ab: <0.1 s/co ratio (ref 0.0–0.9)
HEP A IGM: NEGATIVE
Hep B C IgM: NEGATIVE
Hepatitis B Surface Ag: NEGATIVE

## 2015-09-02 LAB — HEPATITIS B SURFACE ANTIGEN: Hepatitis B Surface Ag: NEGATIVE

## 2015-09-05 LAB — PULMONARY FUNCTION TEST
DL/VA % PRED: 126 %
DL/VA: 5.56 ml/min/mmHg/L
DLCO unc % pred: 86 %
DLCO unc: 17.41 ml/min/mmHg
FEF 25-75 POST: 2.41 L/s
FEF 25-75 Pre: 2.57 L/sec
FEF2575-%CHANGE-POST: -6 %
FEF2575-%Pred-Post: 110 %
FEF2575-%Pred-Pre: 118 %
FEV1-%Change-Post: -1 %
FEV1-%PRED-PRE: 84 %
FEV1-%Pred-Post: 82 %
FEV1-PRE: 1.91 L
FEV1-Post: 1.88 L
FEV1FVC-%CHANGE-POST: 3 %
FEV1FVC-%Pred-Pre: 111 %
FEV6-%Change-Post: -5 %
FEV6-%PRED-PRE: 77 %
FEV6-%Pred-Post: 73 %
FEV6-PRE: 2.2 L
FEV6-Post: 2.09 L
FEV6FVC-%PRED-PRE: 103 %
FEV6FVC-%Pred-Post: 103 %
FVC-%CHANGE-POST: -5 %
FVC-%PRED-PRE: 74 %
FVC-%Pred-Post: 71 %
FVC-POST: 2.09 L
FVC-PRE: 2.2 L
POST FEV1/FVC RATIO: 90 %
POST FEV6/FVC RATIO: 100 %
Pre FEV1/FVC ratio: 87 %
Pre FEV6/FVC Ratio: 100 %
RV % pred: 93 %
RV: 1.72 L
TLC % PRED: 92 %
TLC: 4.27 L

## 2015-09-06 ENCOUNTER — Encounter (HOSPITAL_COMMUNITY): Payer: Self-pay

## 2015-09-06 ENCOUNTER — Other Ambulatory Visit (HOSPITAL_COMMUNITY): Payer: Self-pay | Admitting: Emergency Medicine

## 2015-09-06 ENCOUNTER — Encounter (HOSPITAL_COMMUNITY)
Admission: RE | Admit: 2015-09-06 | Discharge: 2015-09-06 | Disposition: A | Discharge status: Home / Self Care | Payer: 59 | Source: Ambulatory Visit | Attending: Surgery | Admitting: Surgery

## 2015-09-06 DIAGNOSIS — C8114 Nodular sclerosis classical Hodgkin lymphoma, lymph nodes of axilla and upper limb: Secondary | ICD-10-CM

## 2015-09-06 NOTE — Patient Instructions (Addendum)
Holly Stephens   CHEMOTHERAPY INSTRUCTIONS   Premeds: Aloxi - high powered nausea/vomiting prevention medication used for chemotherapy patients. Emend - high powered nausea/vomiting prevention medication used for chemotherapy patients. Dexamethasone - steroid - given to reduce the risk of you having an allergic type reaction to the chemotherapy. Dex can cause you to feel energized, nervous/anxious/jittery, make you have trouble sleeping, and/or make you feel hot/flushed in the face/neck and/or look pink/red in the face/neck. These side effects will pass as the Dex wears off. (takes 20 minutes to infuse)    Adriamycin - bone marrow suppression, nausea, vomiting, hair loss, mouth sores, cardiotoxicity- weakening of the pumping muscle of the heart (this is why we do the 2D Echoes/MUGA scans), sensitivity to light, will turn urine red for a few voids after receiving it. After voiding red a few times, your urine should begin to go back to a normal yellow color. This meciation takes approximately 10-15 minutes to administer.    Dacarbazine - severe neutropenia (low white blood cells (fight infection), and severe lowering of your platelets (which help you blood clot). Your blood counts will be at their lowest 2-3 weeks after receiving your treatment and it could potentially be a little further out than that. This is when you will be most susceptible to picking up a virus, infection, etc. You will receive this medication every 2 weeks therefore we will be keeping a close watch on your blood counts through blood draws. It can also cause severe nausea and vomiting, loss of appetite, rash, flu-like syndrome (muscle aches, fever, chills, poor appetite, fatigue, headache), low blood pressure, light sensitivity. (this medication takes 60 minutes to infuse).    Bleomycin - hair loss, kidney toxicity, pulmonary fibrosis, fever, chills, hypersensitivity or anaphylactic reaction (rare).  Patients with lymphoma have a higher incidence of anaphylaxis after receiving bleomycin than do other patients who receive the drug. Patients who have received bleomycin are at risk for pulmonary toxicity when exposed to oxygen during surgery. You must always -for the rest of your life- disclose to your surgeon/anesthesiologist that you have taken bleomycin to prevent a fatal episode of pulmonary failure. The first sign of pulmonary toxicity is a cough.    Vinblastine - bone marrow suppression (lowers white blood cells (fight infection), lowers red blood cells (make up your blood), lowers platelets (help blood to clot). Hair loss, loss of appetite, jaw pain, peripheral neuropathy (numbness/tingling/burning in hands/fingers/feet/toes, constipation.       SELF IMAGE NEEDS AND REFERRALS MADE: Referral to Look Good, Feel Better consultant (papers given)   EDUCATIONAL MATERIALS GIVEN AND REVIEWED: Chemotherapy and You book  and Specific Instructions Sheets     MEDICATIONS: You have been given prescriptions for the following medications:   Zofran/Ondansetron 8mg  tablet. Take 1 tablet every 8 hours as needed for nausea/vomiting. (#1 nausea med to take, this can constipate)    Compazine/Prochlorperazine 10mg  tablet. Take 1 tablet every 6 hours as needed for nausea/vomiting. (#2 nausea med to take, this can make you sleepy)    EMLA cream. Apply a quarter size amount to port site 1 hour prior to chemo. Do not rub in. Cover with plastic wrap.     Over-the-Counter Meds:    Miralax 1 capful in 8 oz of fluid daily. May increase to two times a day if needed. This is a stool softener. If this doesn't work proceed you can add:   Senokot S - start with 1 tablet two times a  day and increase to 4 tablets two times a day if needed. (total of 8 tablets in a 24 hour period). This is a stimulant laxative.    Call us if this does not help your bowels move.    Imodium 2mg  capsule. Take 2 capsules  after the 1st loose stool and then 1 capsule every 2 hours until you go a total of 12 hours without having a loose stool. Call the [facility] if loose stools continue. If diarrhea occurs @ bedtime, take 2 capsules @ bedtime. Then take 2 capsules every 4 hours until morning. Call [facility].         SYMPTOMS TO REPORT AS SOON AS POSSIBLE AFTER TREATMENT:  FEVER GREATER THAN 100.5 F  CHILLS WITH OR WITHOUT FEVER  NAUSEA AND VOMITING THAT IS NOT CONTROLLED WITH YOUR NAUSEA MEDICATION  UNUSUAL SHORTNESS OF BREATH  UNUSUAL BRUISING OR BLEEDING  TENDERNESS IN MOUTH AND THROAT WITH OR WITHOUT PRESENCE OF ULCERS  URINARY PROBLEMS  BOWEL PROBLEMS  UNUSUAL RASH    Wear comfortable clothing and clothing appropriate for easy access to any Portacath or PICC line. Let us know if there is anything that we can do to make your therapy better!      I have been informed and understand all of the instructions given to me and have received a copy. I have been instructed to call the clinic [phone] or my family physician as soon as possible for continued medical care, if indicated. I do not have any more questions at this time but understand that I may call the [facility] or the Patient Navigator at [phone] during office hours should I have questions or need assistance in obtaining follow-up care.

## 2015-09-06 NOTE — Patient Instructions (Signed)
Holly Stephens  09/06/2015     @PREFPERIOPPHARMACY @   Your procedure is scheduled on  09/08/2015   Report to [facility] at  615  A.M.  Call this number if you have problems the morning of surgery:  [phone]   Remember:  Do not eat food or drink liquids after midnight.  Take these medicines the morning of surgery with A SIP OF WATER  Lopressor, ativan, zofran or compazine.   Do not wear jewelry, make-up or nail polish.  Do not wear lotions, powders, or perfumes.  You may wear deoderant.  Do not shave 48 hours prior to surgery.  Men may shave face and neck.  Do not bring valuables to the hospital.  [facility] is not responsible for any belongings or valuables.  Contacts, dentures or bridgework may not be worn into surgery.  Leave your suitcase in the car.  After surgery it may be brought to your room.  For patients admitted to the hospital, discharge time will be determined by your treatment team.  Patients discharged the day of surgery will not be allowed to drive home.   Name and phone number of your driver:   family Special instructions:  none  Please read over the following fact sheets that you were given. Coughing and Deep Breathing, Surgical Site Infection Prevention, Anesthesia Post-op Instructions and Care and Recovery After Surgery      Implanted [facility] Guide An implanted port is a type of central line that is placed under the skin. Central lines are used to provide IV access when treatment or nutrition needs to be given through a person's veins. Implanted ports are used for long-term IV access. An implanted port may be placed because:   You need IV medicine that would be irritating to the small veins in your hands or arms.   You need long-term IV medicines, such as antibiotics.   You need IV nutrition for a long period.   You need frequent blood draws for lab tests.   You need dialysis.  Implanted ports are usually placed in the  chest area, but they can also be placed in the upper arm, the abdomen, or the leg. An implanted port has two main parts:   Reservoir. The reservoir is round and will appear as a small, raised area under your skin. The reservoir is the part where a needle is inserted to give medicines or draw blood.   Catheter. The catheter is a thin, flexible tube that extends from the reservoir. The catheter is placed into a large vein. Medicine that is inserted into the reservoir goes into the catheter and then into the vein.  HOW WILL I CARE FOR MY INCISION SITE? Do not get the incision site wet. Bathe or shower as directed by your health care provider.  HOW IS MY PORT ACCESSED? Special steps must be taken to access the port:   Before the port is accessed, a numbing cream can be placed on the skin. This helps numb the skin over the port site.   Your health care provider uses a sterile technique to access the port.  Your health care provider must put on a mask and sterile gloves.  The skin over your port is cleaned carefully with an antiseptic and allowed to dry.  The port is gently pinched between sterile gloves, and a needle is inserted into the port.  Only "non-coring" port needles should be used  to access the port. Once the port is accessed, a blood return should be checked. This helps ensure that the port is in the vein and is not clogged.   If your port needs to remain accessed for a constant infusion, a clear (transparent) bandage will be placed over the needle site. The bandage and needle will need to be changed every week, or as directed by your health care provider.   Keep the bandage covering the needle clean and dry. Do not get it wet. Follow your health care provider's instructions on how to take a shower or bath while the port is accessed.   If your port does not need to stay accessed, no bandage is needed over the port.  WHAT IS FLUSHING? Flushing helps keep the port from getting  clogged. Follow your health care provider's instructions on how and when to flush the port. Ports are usually flushed with saline solution or a medicine called heparin. The need for flushing will depend on how the port is used.   If the port is used for intermittent medicines or blood draws, the port will need to be flushed:   After medicines have been given.   After blood has been drawn.   As part of routine maintenance.   If a constant infusion is running, the port may not need to be flushed.  HOW LONG WILL MY PORT STAY IMPLANTED? The port can stay in for as long as your health care provider thinks it is needed. When it is time for the port to come out, surgery will be done to remove it. The procedure is similar to the one performed when the port was put in.  WHEN SHOULD I SEEK IMMEDIATE MEDICAL CARE? When you have an implanted port, you should seek immediate medical care if:   You notice a bad smell coming from the incision site.   You have swelling, redness, or drainage at the incision site.   You have more swelling or pain at the port site or the surrounding area.   You have a fever that is not controlled with medicine.   This information is not intended to replace advice given to you by your health care provider. Make sure you discuss any questions you have with your health care provider.   Document Released: 02/06/2005 Document Revised: 11/27/2012 Document Reviewed: 10/14/2012 Elsevier Interactive Patient Education 2016 [place] Insertion An implanted port is a central line that has a round shape and is placed under the skin. It is used as a long-term IV access for:   Medicines, such as chemotherapy.   Fluids.   Liquid nutrition, such as total parenteral nutrition (TPN).   Blood samples.  LET [facility] CARE PROVIDER KNOW ABOUT:  Allergies to food or medicine.   Medicines taken, including vitamins, herbs, eye drops, creams, and  over-the-counter medicines.   Any allergies to heparin.  Use of steroids (by mouth or creams).   Previous problems with anesthetics or numbing medicines.   History of bleeding problems or blood clots.   Previous surgery.   Other health problems, including diabetes and kidney problems.   Possibility of pregnancy, if this applies. RISKS AND COMPLICATIONS Generally, this is a safe procedure. However, as with any procedure, problems can occur. Possible problems include:  Damage to the blood vessel, bruising, or bleeding at the puncture site.   Infection.  Blood clot in the vessel that the port is in.  Breakdown of the skin over your  port.  Very rarely a person may develop a condition called a pneumothorax, a collection of air in the chest that may cause one of the lungs to collapse. The placement of these catheters with the appropriate imaging guidance significantly decreases the risk of a pneumothorax.  BEFORE THE PROCEDURE   Your health care provider may want you to have blood tests. These tests can help tell how well your kidneys and liver are working. They can also show how well your blood clots.   If you take blood thinners (anticoagulant medicines), ask your health care provider when you should stop taking them.   Make arrangements for someone to drive you home. This is necessary if you have been sedated for your procedure.  PROCEDURE  Port insertion usually takes about 30-45 minutes.   An IV needle will be inserted in your arm. Medicine for pain and medicine to help relax you (sedative) will flow directly into your body through this needle.   You will lie on an exam table, and you will be connected to monitors to keep track of your heart rate, blood pressure, and breathing throughout the procedure.  An oxygen monitoring device may be attached to your finger. Oxygen will be given.   Everything will be kept as germ free (sterile) as possible during the  procedure. The skin near the point of the incision will be cleansed with antiseptic, and the area will be draped with sterile towels. The skin and deeper tissues over the port area will be made numb with a local anesthetic.  Two small cuts (incisions) will be made in the skin to insert the port. One will be made in the neck to obtain access to the vein where the catheter will lie.   Because the port reservoir will be placed under the skin, a small skin incision will be made in the upper chest, and a small pocket for the port will be made under the skin. The catheter that will be connected to the port tunnels to a large central vein in the chest. A small, raised area will remain on your body at the site of the reservoir when the procedure is complete.  The port placement will be done under imaging guidance to ensure the proper placement.  The reservoir has a silicone covering that can be punctured with a special needle.   The port will be flushed with normal saline, and blood will be drawn to make sure it is working properly.  There will be nothing remaining outside the skin when the procedure is finished.   Incisions will be held together by stitches, surgical glue, or a special tape. AFTER THE PROCEDURE  You will stay in a recovery area until the anesthesia has worn off. Your blood pressure and pulse will be checked.  A final chest X-ray will be taken to check the placement of the port and to ensure that there is no injury to your lung.   This information is not intended to replace advice given to you by your health care provider. Make sure you discuss any questions you have with your health care provider.   Document Released: 11/27/2012 Document Revised: 02/27/2014 Document Reviewed: 11/27/2012 Elsevier Interactive Patient Education 2016 [place] Insertion, Care After Refer to this sheet in the next few weeks. These instructions provide you with information on  caring for yourself after your procedure. Your health care provider may also give you more specific instructions. Your treatment has been planned according to  current medical practices, but problems sometimes occur. Call your health care provider if you have any problems or questions after your procedure. WHAT TO EXPECT AFTER THE PROCEDURE After your procedure, it is typical to have the following:   Discomfort at the port insertion site. Ice packs to the area will help.  Bruising on the skin over the port. This will subside in 3-4 days. HOME CARE INSTRUCTIONS  After your port is placed, you will get a manufacturer's information card. The card has information about your port. Keep this card with you at all times.   Know what kind of port you have. There are many types of ports available.   Wear a medical alert bracelet in case of an emergency. This can help alert health care workers that you have a port.   The port can stay in for as long as your health care provider believes it is necessary.   A home health care nurse may give medicines and take care of the port.   You or a family member can get special training and directions for giving medicine and taking care of the port at home.  SEEK MEDICAL CARE IF:   Your port does not flush or you are unable to get a blood return.   You have a fever or chills. SEEK IMMEDIATE MEDICAL CARE IF:  You have new fluid or pus coming from your incision.   You notice a bad smell coming from your incision site.   You have swelling, pain, or more redness at the incision or port site.   You have chest pain or shortness of breath.   This information is not intended to replace advice given to you by your health care provider. Make sure you discuss any questions you have with your health care provider.   Document Released: 11/27/2012 Document Revised: 02/11/2013 Document Reviewed: 11/27/2012 Elsevier Interactive Patient Education 2016  [employer] PATIENT INSTRUCTIONS POST-ANESTHESIA  IMMEDIATELY FOLLOWING SURGERY:  Do not drive or operate machinery for the first twenty four hours after surgery.  Do not make any important decisions for twenty four hours after surgery or while taking narcotic pain medications or sedatives.  If you develop intractable nausea and vomiting or a severe headache please notify your doctor immediately.  FOLLOW-UP:  Please make an appointment with your surgeon as instructed. You do not need to follow up with anesthesia unless specifically instructed to do so.  WOUND CARE INSTRUCTIONS (if applicable):  Keep a dry clean dressing on the anesthesia/puncture wound site if there is drainage.  Once the wound has quit draining you may leave it open to air.  Generally you should leave the bandage intact for twenty four hours unless there is drainage.  If the epidural site drains for more than 36-48 hours please call the anesthesia department.  QUESTIONS?:  Please feel free to call your physician or the hospital operator if you have any questions, and they will be happy to assist you.

## 2015-09-07 ENCOUNTER — Telehealth (HOSPITAL_COMMUNITY): Payer: Self-pay | Admitting: *Deleted

## 2015-09-07 NOTE — Telephone Encounter (Signed)
disregard

## 2015-09-08 ENCOUNTER — Ambulatory Visit (HOSPITAL_COMMUNITY): Payer: 59 | Admitting: Anesthesiology

## 2015-09-08 ENCOUNTER — Ambulatory Visit (HOSPITAL_COMMUNITY): Payer: 59

## 2015-09-08 ENCOUNTER — Encounter (HOSPITAL_COMMUNITY)
Admission: RE | Disposition: A | Discharge status: Home / Self Care | Payer: Self-pay | Source: Ambulatory Visit | Attending: Surgery

## 2015-09-08 ENCOUNTER — Encounter (HOSPITAL_COMMUNITY): Payer: Self-pay | Admitting: *Deleted

## 2015-09-08 ENCOUNTER — Ambulatory Visit (HOSPITAL_COMMUNITY)
Admission: RE | Admit: 2015-09-08 | Discharge: 2015-09-08 | Disposition: A | Discharge status: Home / Self Care | Payer: 59 | Source: Ambulatory Visit | Attending: Surgery | Admitting: Surgery

## 2015-09-08 DIAGNOSIS — E119 Type 2 diabetes mellitus without complications: Secondary | ICD-10-CM | POA: Diagnosis not present

## 2015-09-08 DIAGNOSIS — Z86718 Personal history of other venous thrombosis and embolism: Secondary | ICD-10-CM | POA: Insufficient documentation

## 2015-09-08 DIAGNOSIS — B20 Human immunodeficiency virus [HIV] disease: Secondary | ICD-10-CM | POA: Diagnosis not present

## 2015-09-08 DIAGNOSIS — Z6841 Body Mass Index (BMI) 40.0 and over, adult: Secondary | ICD-10-CM | POA: Insufficient documentation

## 2015-09-08 DIAGNOSIS — C8190 Hodgkin lymphoma, unspecified, unspecified site: Secondary | ICD-10-CM | POA: Diagnosis present

## 2015-09-08 DIAGNOSIS — Z789 Other specified health status: Secondary | ICD-10-CM

## 2015-09-08 DIAGNOSIS — I10 Essential (primary) hypertension: Secondary | ICD-10-CM | POA: Insufficient documentation

## 2015-09-08 HISTORY — PX: PORTACATH PLACEMENT: SHX2246

## 2015-09-08 LAB — GLUCOSE, CAPILLARY
GLUCOSE-CAPILLARY: 152 mg/dL — AB (ref 65–99)
Glucose-Capillary: 120 mg/dL — ABNORMAL HIGH (ref 65–99)

## 2015-09-08 SURGERY — INSERTION, TUNNELED CENTRAL VENOUS DEVICE, WITH PORT
Anesthesia: Monitor Anesthesia Care | Site: Chest | Laterality: Right

## 2015-09-08 MED ORDER — VANCOMYCIN HCL 1000 MG IV SOLR
1000.0000 mg | INTRAVENOUS | Status: DC | PRN
  Administered 2015-09-08: 1500 mg via INTRAVENOUS

## 2015-09-08 MED ORDER — SODIUM CHLORIDE 0.9 % IV SOLN
INTRAVENOUS | Status: DC | PRN
  Administered 2015-09-08: 08:00:00 via INTRAVENOUS

## 2015-09-08 MED ORDER — FENTANYL CITRATE (PF) 100 MCG/2ML IJ SOLN
INTRAMUSCULAR | Status: AC
  Filled 2015-09-08: qty 2

## 2015-09-08 MED ORDER — BUPIVACAINE HCL (PF) 0.5 % IJ SOLN
INTRAMUSCULAR | Status: AC
  Filled 2015-09-08: qty 30

## 2015-09-08 MED ORDER — LIDOCAINE HCL (PF) 1 % IJ SOLN
INTRAMUSCULAR | Status: AC
  Filled 2015-09-08: qty 30

## 2015-09-08 MED ORDER — MIDAZOLAM HCL 2 MG/2ML IJ SOLN
INTRAMUSCULAR | Status: AC
  Filled 2015-09-08: qty 2

## 2015-09-08 MED ORDER — HEPARIN SOD (PORK) LOCK FLUSH 100 UNIT/ML IV SOLN
INTRAVENOUS | Status: AC
  Filled 2015-09-08: qty 5

## 2015-09-08 MED ORDER — HEPARIN SOD (PORK) LOCK FLUSH 100 UNIT/ML IV SOLN
INTRAVENOUS | Status: DC | PRN
  Administered 2015-09-08: 500 [IU] via INTRAVENOUS

## 2015-09-08 MED ORDER — FENTANYL CITRATE (PF) 100 MCG/2ML IJ SOLN: 25.0000 ug | INTRAMUSCULAR | Status: DC | PRN

## 2015-09-08 MED ORDER — LACTATED RINGERS IV SOLN: INTRAVENOUS | Status: DC

## 2015-09-08 MED ORDER — PROPOFOL 10 MG/ML IV BOLUS
INTRAVENOUS | Status: AC
  Filled 2015-09-08: qty 40

## 2015-09-08 MED ORDER — MIDAZOLAM HCL 2 MG/2ML IJ SOLN
1.0000 mg | INTRAMUSCULAR | Status: DC | PRN
Start: 2015-09-08 — End: 2015-09-08
  Administered 2015-09-08: 1 mg via INTRAVENOUS

## 2015-09-08 MED ORDER — ONDANSETRON HCL 4 MG/2ML IJ SOLN: 4.0000 mg | Freq: Once | INTRAMUSCULAR | Status: DC | PRN

## 2015-09-08 MED ORDER — FENTANYL CITRATE (PF) 100 MCG/2ML IJ SOLN
25.0000 ug | INTRAMUSCULAR | Status: AC | PRN
  Administered 2015-09-08 (×2): 25 ug via INTRAVENOUS

## 2015-09-08 MED ORDER — PROPOFOL 500 MG/50ML IV EMUL
INTRAVENOUS | Status: DC | PRN
  Administered 2015-09-08: 50 ug/kg/min via INTRAVENOUS

## 2015-09-08 MED ORDER — SODIUM CHLORIDE 0.9 % IV SOLN
1500.0000 mg | INTRAVENOUS | Status: DC
  Filled 2015-09-08: qty 1500

## 2015-09-08 MED ORDER — LIDOCAINE HCL (PF) 1 % IJ SOLN
INTRAMUSCULAR | Status: DC | PRN
Start: 2015-09-08 — End: 2015-09-08
  Administered 2015-09-08: 5 mL

## 2015-09-08 MED ORDER — SODIUM CHLORIDE 0.9 % IV SOLN
INTRAVENOUS | Status: DC | PRN
  Administered 2015-09-08: 500 mL via INTRAVENOUS

## 2015-09-08 MED ORDER — MIDAZOLAM HCL 5 MG/5ML IJ SOLN
INTRAMUSCULAR | Status: DC | PRN
Start: 2015-09-08 — End: 2015-09-08
  Administered 2015-09-08 (×4): 1 mg via INTRAVENOUS

## 2015-09-08 MED ORDER — CHLORHEXIDINE GLUCONATE CLOTH 2 % EX PADS: 6.0000 | MEDICATED_PAD | Freq: Once | CUTANEOUS | Status: DC

## 2015-09-08 MED ORDER — BUPIVACAINE HCL (PF) 0.5 % IJ SOLN
INTRAMUSCULAR | Status: DC | PRN
Start: 2015-09-08 — End: 2015-09-08
  Administered 2015-09-08: 5 mL

## 2015-09-08 MED ORDER — OXYCODONE-ACETAMINOPHEN 5-325 MG PO TABS: 1.0000 | ORAL_TABLET | ORAL | Status: DC | PRN

## 2015-09-08 MED ORDER — FENTANYL CITRATE (PF) 250 MCG/5ML IJ SOLN
INTRAMUSCULAR | Status: DC | PRN
  Administered 2015-09-08: 50 ug via INTRAVENOUS

## 2015-09-08 SURGICAL SUPPLY — 35 items
APPLIER CLIP 9.375 SM OPEN (CLIP)
BAG DECANTER FOR FLEXI CONT (MISCELLANEOUS) ×2 IMPLANT
BAG HAMPER (MISCELLANEOUS) ×2 IMPLANT
CATH HICKMAN DUAL 12.0 (CATHETERS) IMPLANT
CHLORAPREP W/TINT 10.5 ML (MISCELLANEOUS) ×4 IMPLANT
CLIP APPLIE 9.375 SM OPEN (CLIP) IMPLANT
CLOTH BEACON ORANGE TIMEOUT ST (SAFETY) ×2 IMPLANT
COVER LIGHT HANDLE STERIS (MISCELLANEOUS) ×4 IMPLANT
DECANTER SPIKE VIAL GLASS SM (MISCELLANEOUS) ×2 IMPLANT
DERMABOND ADVANCED (GAUZE/BANDAGES/DRESSINGS) ×1
DERMABOND ADVANCED .7 DNX12 (GAUZE/BANDAGES/DRESSINGS) ×1 IMPLANT
DRAPE C-ARM FOLDED MOBILE STRL (DRAPES) ×2 IMPLANT
ELECT REM PT RETURN 9FT ADLT (ELECTROSURGICAL) ×2
ELECTRODE REM PT RTRN 9FT ADLT (ELECTROSURGICAL) ×1 IMPLANT
GLOVE BIOGEL PI IND STRL 7.0 (GLOVE) ×2 IMPLANT
GLOVE BIOGEL PI INDICATOR 7.0 (GLOVE) ×2
GLOVE SURG SS PI 7.5 STRL IVOR (GLOVE) ×2 IMPLANT
GOWN STRL REUS W/TWL LRG LVL3 (GOWN DISPOSABLE) ×6 IMPLANT
IV NS 500ML (IV SOLUTION) ×1
IV NS 500ML BAXH (IV SOLUTION) ×1 IMPLANT
KIT PORT POWER 8FR ISP MRI (Port) ×2 IMPLANT
KIT ROOM TURNOVER APOR (KITS) ×2 IMPLANT
MANIFOLD NEPTUNE II (INSTRUMENTS) ×2 IMPLANT
NEEDLE HYPO 25X1 1.5 SAFETY (NEEDLE) ×2 IMPLANT
PACK MINOR (CUSTOM PROCEDURE TRAY) ×2 IMPLANT
PAD ARMBOARD 7.5X6 YLW CONV (MISCELLANEOUS) ×2 IMPLANT
SET BASIN LINEN APH (SET/KITS/TRAYS/PACK) ×2 IMPLANT
SET INTRODUCER 12FR PACEMAKER (SHEATH) IMPLANT
SHEATH COOK PEEL AWAY SET 8F (SHEATH) IMPLANT
SUT PROLENE 3 0 PS 2 (SUTURE) IMPLANT
SUT VIC AB 3-0 SH 27 (SUTURE) ×1
SUT VIC AB 3-0 SH 27X BRD (SUTURE) ×1 IMPLANT
SUT VIC AB 4-0 PS2 27 (SUTURE) ×2 IMPLANT
SYR 20CC LL (SYRINGE) ×2 IMPLANT
SYR CONTROL 10ML LL (SYRINGE) ×2 IMPLANT

## 2015-09-08 NOTE — Op Note (Signed)
SURGICAL PROCEDURE REPORT  DATE OF PROCEDURE: 09/08/2015   ATTENDING SURGEON: Corene Cornea E. Rosana Hoes, MD   ANESTHESIA: Local with light IV sedation   PRE-OPERATIVE DIAGNOSIS: Hodgkins lymphoma requiring durable central venous access for chemotherapy  POST-OPERATIVE DIAGNOSIS: Hodgkins lymphoma requiring durable central venous access for chemotherapy  PROCEDURE(S):  1.) Percutaneous access of Right internal jugular vein under ultrasound guidance  2.) Insertion of tunneled Right internal jugular Bard PowerPort central venous catheter with subcutaneous port  INTRAOPERATIVE FINDINGS: Patent easily compressible Right internal jugular vein with appropriate respiratory variations and well-secured tunneled central venous catheter with subcutaneous port at completion of the procedure  INTRAOPERATIVE FLUIDS: 1000 mL crystalloid, 0 mL contrast used   FLUOROSCOPY: 23 seconds   ESTIMATED BLOOD LOSS: Minimal (<20 mL)   SPECIMENS: None   IMPLANTS: 69F tunneled Bard PowerPort central venous catheter with subcutaneous port  DRAINS: None   COMPLICATIONS: None apparent   CONDITION AT COMPLETION: Hemodynamically stable, awake   DISPOSITION: PACU   INDICATION(S) FOR PROCEDURE:  Patient is a 60 y.o. female who presented with Hodgkins lymphoma requiring durable central venous access for chemotherapy. All risks, benefits, and alternatives to above elective procedures were discussed with the patient, who elected to proceed, and informed consent was accordingly obtained at that time.  DETAILS OF PROCEDURE:  Patient was brought to the operative suite and appropriately identified. In Trendelenburg position, Right IJ venous access site was prepped and draped in the usual sterile fashion, and following a brief timeout, limited duplex evaluation of the Right internal jugular vein was performed. Percutaneous Right IJ venous access was obtained under ultrasound guidance using Seldinger technique, by which local  anesthetic was injected over the Right IJ vein, and access needle was inserted under direct ultrasound visualization into the Right IJ vein, through which soft guidewire was advanced, over which access needle was withdrawn. Guidewire was secured, attention was directed to injection of local anesthetic along the planned tunnel site, 2-3 cm transverse Right chest incision was made and confirmed to accommodate the subcutaneous port, and flushed catheter was tunneled retrograde from the port site over the Right chest to the Right IJ access site with the attached port well-secured to the catheter and within the subcutaneous pocket. Insertion sheath was advanced over the guidewire, which was withdrawn along with the insertion sheath dilator.  Length of catheter needed to position the catheter tip at the atrio-caval junction was then measured under direct fluoroscopic visualization, after which the catheter was cut to the measured length and advanced through the sheath into the Right internal jugular vein and SVC without evidence of cardiac arrhythmias during the procedure. Port was confirmed to withdraw blood and flush easily, after which concentrated heparin was instilled into the port and catheter. Dermis at the subcutaneous pocket was re-approximated using buried interrupted 3-0 Vicryl suture, and 4-0 Vicryl suture was used to re-approximate skin at the insertion and subcutaneous port sites in running subcuticular fashion for the subcutaneous port and buried interrupted fashion for the insertion site. Skin was cleaned, dried, and sterile skin glue was applied. Patient was then safely transferred to PACU for a chest x-ray.  I was present for all aspects of the procedures, and there were no intraprocedural complications apparent.

## 2015-09-08 NOTE — Anesthesia Postprocedure Evaluation (Signed)
Anesthesia Post Note  Patient: Holly Stephens  Procedure(s) Performed: Procedure(s) (LRB): INSERTION OF CENTRAL VENOUS CATHETER WITH PORT FOR CHEMOTHERAPY (Right)  Patient location during evaluation: PACU Anesthesia Type: MAC Level of consciousness: awake and alert, oriented and patient cooperative Pain management: pain level controlled Vital Signs Assessment: post-procedure vital signs reviewed and stable Respiratory status: spontaneous breathing, nonlabored ventilation and respiratory function stable Cardiovascular status: blood pressure returned to baseline Postop Assessment: no signs of nausea or vomiting Anesthetic complications: no    Last Vitals:  Filed Vitals:   09/08/15 0715 09/08/15 0730  BP: 113/63 114/62  Pulse:    Temp:    Resp: 20 11    Last Pain: There were no vitals filed for this visit.               Lashe Oliveira J

## 2015-09-08 NOTE — Transfer of Care (Signed)
Immediate Anesthesia Transfer of Care Note  Patient: Holly Stephens  Procedure(s) Performed: Procedure(s): INSERTION OF CENTRAL VENOUS CATHETER WITH PORT FOR CHEMOTHERAPY (Right)  Patient Location: PACU  Anesthesia Type:MAC  Level of Consciousness: awake, alert , oriented and patient cooperative  Airway & Oxygen Therapy: Patient Spontanous Breathing and Patient connected to face mask oxygen  Post-op Assessment: Report given to RN, Post -op Vital signs reviewed and stable and Patient moving all extremities  Post vital signs: Reviewed and stable  Last Vitals:  Filed Vitals:   09/08/15 0715 09/08/15 0730  BP: 113/63 114/62  Pulse:    Temp:    Resp: 20 11    Last Pain: There were no vitals filed for this visit.    Patients Stated Pain Goal: 6 ([id number])  Complications: No apparent anesthesia complications

## 2015-09-08 NOTE — H&P (Signed)
Post Operative Follow Up - 09/08/2015  Patient Name: Holly Stephens Date of Birth: 01/02/1956  Subjective: 60 year old female presents for surgery followup and to discuss placement of central venous catheter with port for chemotherapy to treat Hodgkins lymphoma diagnosed by excisional Left axillary node biopsy. Patient has no  significant complaints, denies any history of upper extremity DVT, arm swelling, or prior indwelling central venous catheter.  Review of Symptoms:  Constitutional:No fevers, chills, or unexplained weight loss Head:Atraumatic; no masses; no abnormalities Eyes:No visual changes or eye pain Nose/Mouth/Throat:No nasal congestion, rhinorrhea, oral lesions, postnasal drip or sore throat  Cardiovascular: No chest pain or palpitations.  Respiratory:No cough, shortness of breath or wheezing  GastrointestinNo diarrhea, constipation, blood in stools, abdominal pain, vomiting or heartburn Genitourinary:No urinary frequency, hematuria, incontinence, or dysuria Musculoskeletal:No arthalgias, myalgias or joint swelling Skin:No rash or bothersome skin lesions Breast:No lumps or nipple discharge, Left axillary mass as per HPI Hematolgic/Lymphatic:No easy bruising or bleeding, Left axillary mass as per HPI   Past Medical History  Diabetes, High Blood pressure, High cholesterol, HIV/AIDS reportedly well-controlled, workup for Lupus ongoing  Family Health History Mother - Unknown; Diabetes mellitus, unspecified type; Stroke (CVA);  Father - Unknown; Heart disease;  Sister - Living; Thromboembolic disorder - DVT;   Social History  Preferred Language: English Race:  White Ethnicity: Not Hispanic / Latino Age: 58 year Marital Status:  S Alcohol: No Recreational drug(s): No  Allergies:  Augmentin (Drug)  Vitals as of [id number]:  Systolic 123456: Diastolic 77: Heart Rate 78: Temp 35.56C (Temporal):  Height 155CM: Weight 136.53KG: BMI 56.87   Physical  Exam: General: Well appearing, well nourished in no distress. Musculoskeletal: Well-healing NT Left axillary incision, B/L no upper extremity edema, B/L hands warm with palpable radial pulses, no neck scars appreciated  Assessment:  60 year-old obese HIV+ female with recently diagnosed Hodgkins lymphoma requiring central venous access for chemotherapy. Diagnosis & Procedure Smart Code  Plan:       - Will plan for placement of tunneled central venous catheter with port for chemotherapy to treat recently diagnosed Hodgkins lymphoma      - All risks, benefits, and alternatives to above elective procedure were discussed, all of patient's questions were answered to her expressed satisfaction, and informed consent was obtained      - Follow-up in office 2 weeks post-surgery, patient instructed to call if any questions or concerns prior to follow-up  -- Corene Cornea E. Rosana Hoes, MD, [place]: [facility] Office #: [phone]

## 2015-09-08 NOTE — Anesthesia Preprocedure Evaluation (Signed)
Anesthesia Evaluation  Patient identified by MRN, date of birth, ID band Patient awake    Reviewed: Allergy & Precautions, NPO status , Patient's Chart, lab work & pertinent test results, reviewed documented beta blocker date and time   Airway Mallampati: II  TM Distance: >3 FB     Dental  (+) Teeth Intact   Pulmonary neg pulmonary ROS,    breath sounds clear to auscultation       Cardiovascular hypertension, Pt. on home beta blockers and Pt. on medications  Rhythm:Regular Rate:Normal     Neuro/Psych PSYCHIATRIC DISORDERS Depression    GI/Hepatic negative GI ROS,   Endo/Other  diabetes, Type 2Morbid obesity  Renal/GU      Musculoskeletal   Abdominal   Peds  Hematology   Anesthesia Other Findings lymphadenopathy suspicious for lymphoma [name]   Reproductive/Obstetrics                             Anesthesia Physical Anesthesia Plan  ASA: III  Anesthesia Plan: MAC   Post-op Pain Management:    Induction: Intravenous  Airway Management Planned: Simple Face Mask  Additional Equipment:   Intra-op Plan:   Post-operative Plan:   Informed Consent: I have reviewed the patients History and Physical, chart, labs and discussed the procedure including the risks, benefits and alternatives for the proposed anesthesia with the patient or authorized representative who has indicated his/her understanding and acceptance.     Plan Discussed with:   Anesthesia Plan Comments:         Anesthesia Quick Evaluation

## 2015-09-08 NOTE — Discharge Instructions (Addendum)
In addition to included general post-operative instructions for insertion of central venous catheter with port for chemotherapy,  Diet: Resume home heart healthy diet.   Activity: No heavy lifting (children, pets, laundry) or strenuous activity until follow-up, but light activity and walking are encouraged. Do not drive or drink alcohol if taking narcotic pain medications.   Wound care: 2 days after surgery (Thursday, 7/21), may shower/get incision wet with soapy water and pat dry (do not rub incisions), but no baths or submerging incision underwater until follow-up.   Medications: Resume all home medications. For mild to moderate pain: acetaminophen (Tylenol) or ibuprofen (if no kidney disease). Narcotic pain medications, if prescribed, can be used for severe pain, though may cause nausea, constipation, and drowsiness. Do not combine Tylenol and Percocet within a 6 hour period as Percocet contains Tylenol.  Call office [phone]) at any time if any questions, worsening pain, fevers/chills, bleeding, drainage from incision site, or other concerns.    PATIENT INSTRUCTIONS POST-ANESTHESIA  IMMEDIATELY FOLLOWING SURGERY:  Do not drive or operate machinery for the first twenty four hours after surgery.  Do not make any important decisions for twenty four hours after surgery or while taking narcotic pain medications or sedatives.  If you develop intractable nausea and vomiting or a severe headache please notify your doctor immediately.  FOLLOW-UP:  Please make an appointment with your surgeon as instructed. You do not need to follow up with anesthesia unless specifically instructed to do so.  WOUND CARE INSTRUCTIONS (if applicable):  Keep a dry clean dressing on the anesthesia/puncture wound site if there is drainage.  Once the wound has quit draining you may leave it open to air.  Generally you should leave the bandage intact for twenty four hours unless there is drainage.  If the epidural site  drains for more than 36-48 hours please call the anesthesia department.  QUESTIONS?:  Please feel free to call your physician or the hospital operator if you have any questions, and they will be happy to assist you.      Implanted Port Insertion, Care After Refer to this sheet in the next few weeks. These instructions provide you with information on caring for yourself after your procedure. Your health care provider may also give you more specific instructions. Your treatment has been planned according to current medical practices, but problems sometimes occur. Call your health care provider if you have any problems or questions after your procedure. WHAT TO EXPECT AFTER THE PROCEDURE After your procedure, it is typical to have the following:   Discomfort at the port insertion site. Ice packs to the area will help.  Bruising on the skin over the port. This will subside in 3-4 days. HOME CARE INSTRUCTIONS  After your port is placed, you will get a manufacturer's information card. The card has information about your port. Keep this card with you at all times.   Know what kind of port you have. There are many types of ports available.   Wear a medical alert bracelet in case of an emergency. This can help alert health care workers that you have a port.   The port can stay in for as long as your health care provider believes it is necessary.   A home health care nurse may give medicines and take care of the port.   You or a family member can get special training and directions for giving medicine and taking care of the port at home.  SEEK MEDICAL CARE IF:  Your port does not flush or you are unable to get a blood return.   You have a fever or chills. SEEK IMMEDIATE MEDICAL CARE IF:  You have new fluid or pus coming from your incision.   You notice a bad smell coming from your incision site.   You have swelling, pain, or more redness at the incision or port site.   You  have chest pain or shortness of breath.   This information is not intended to replace advice given to you by your health care provider. Make sure you discuss any questions you have with your health care provider.   Document Released: 11/27/2012 Document Revised: 02/11/2013 Document Reviewed: 11/27/2012 Elsevier Interactive Patient Education [employer].

## 2015-09-08 NOTE — Interval H&P Note (Signed)
History and Physical Interval Note:  09/08/2015 7:30 AM  Holly Stephens  has presented today for surgery, with the diagnosis of hodgkins lymphoma  The various methods of treatment have been discussed with the patient and family. After consideration of risks, benefits and other options for treatment, the patient has consented to  Procedure(s): INSERTION PORT-A-CATH (N/A) as a surgical intervention .  The patient's history has been reviewed, patient examined, no change in status, stable for surgery.  I have reviewed the patient's chart and labs.  Questions were answered to the patient's satisfaction.     [name]

## 2015-09-09 NOTE — Addendum Note (Signed)
Addendum  created 09/09/15 1145 by Charmaine Downs, CRNA   Modules edited: Charges VN

## 2015-09-12 NOTE — Addendum Note (Signed)
Addended by: Patrici Ranks on: 09/12/2015 10:25 PM   Modules accepted: Orders

## 2015-09-13 ENCOUNTER — Encounter (HOSPITAL_COMMUNITY): Payer: 59

## 2015-09-13 ENCOUNTER — Encounter (HOSPITAL_COMMUNITY): Payer: Self-pay | Admitting: Oncology

## 2015-09-13 ENCOUNTER — Encounter (HOSPITAL_BASED_OUTPATIENT_CLINIC_OR_DEPARTMENT_OTHER): Payer: 59

## 2015-09-13 ENCOUNTER — Encounter (HOSPITAL_BASED_OUTPATIENT_CLINIC_OR_DEPARTMENT_OTHER): Payer: 59 | Admitting: Oncology

## 2015-09-13 VITALS — BP 143/65 | HR 69 | Temp 98.5°F | Resp 18 | Wt 306.2 lb

## 2015-09-13 DIAGNOSIS — C8118 Nodular sclerosis classical Hodgkin lymphoma, lymph nodes of multiple sites: Secondary | ICD-10-CM

## 2015-09-13 DIAGNOSIS — M25473 Effusion, unspecified ankle: Secondary | ICD-10-CM

## 2015-09-13 DIAGNOSIS — C8114 Nodular sclerosis classical Hodgkin lymphoma, lymph nodes of axilla and upper limb: Secondary | ICD-10-CM | POA: Diagnosis not present

## 2015-09-13 DIAGNOSIS — R609 Edema, unspecified: Secondary | ICD-10-CM

## 2015-09-13 DIAGNOSIS — B20 Human immunodeficiency virus [HIV] disease: Secondary | ICD-10-CM

## 2015-09-13 DIAGNOSIS — Z5111 Encounter for antineoplastic chemotherapy: Secondary | ICD-10-CM

## 2015-09-13 LAB — COMPREHENSIVE METABOLIC PANEL
ALT: 17 U/L (ref 14–54)
ANION GAP: 7 (ref 5–15)
AST: 26 U/L (ref 15–41)
Albumin: 3.2 g/dL — ABNORMAL LOW (ref 3.5–5.0)
Alkaline Phosphatase: 54 U/L (ref 38–126)
BUN: 10 mg/dL (ref 6–20)
CHLORIDE: 107 mmol/L (ref 101–111)
CO2: 25 mmol/L (ref 22–32)
CREATININE: 0.54 mg/dL (ref 0.44–1.00)
Calcium: 9.6 mg/dL (ref 8.9–10.3)
Glucose, Bld: 84 mg/dL (ref 65–99)
Potassium: 3.7 mmol/L (ref 3.5–5.1)
SODIUM: 139 mmol/L (ref 135–145)
Total Bilirubin: 0.4 mg/dL (ref 0.3–1.2)
Total Protein: 7.2 g/dL (ref 6.5–8.1)

## 2015-09-13 LAB — CBC WITH DIFFERENTIAL/PLATELET
BASOS PCT: 0 %
Basophils Absolute: 0 10*3/uL (ref 0.0–0.1)
Eosinophils Absolute: 0.2 10*3/uL (ref 0.0–0.7)
Eosinophils Relative: 1 %
HEMATOCRIT: 38.4 % (ref 36.0–46.0)
HEMOGLOBIN: 12.2 g/dL (ref 12.0–15.0)
LYMPHS ABS: 3.1 10*3/uL (ref 0.7–4.0)
LYMPHS PCT: 19 %
MCH: 29.3 pg (ref 26.0–34.0)
MCHC: 31.8 g/dL (ref 30.0–36.0)
MCV: 92.3 fL (ref 78.0–100.0)
MONO ABS: 0.9 10*3/uL (ref 0.1–1.0)
MONOS PCT: 5 %
NEUTROS ABS: 12.1 10*3/uL — AB (ref 1.7–7.7)
Neutrophils Relative %: 75 %
Platelets: 235 10*3/uL (ref 150–400)
RBC: 4.16 MIL/uL (ref 3.87–5.11)
RDW: 14.3 % (ref 11.5–15.5)
WBC: 16.3 10*3/uL — ABNORMAL HIGH (ref 4.0–10.5)

## 2015-09-13 MED ORDER — DOXORUBICIN HCL CHEMO IV INJECTION 2 MG/ML
25.0000 mg/m2 | Freq: Once | INTRAVENOUS | Status: AC
  Administered 2015-09-13: 60 mg via INTRAVENOUS
  Filled 2015-09-13: qty 30

## 2015-09-13 MED ORDER — DEXAMETHASONE 4 MG PO TABS: ORAL_TABLET | ORAL | 1 refills | Status: DC

## 2015-09-13 MED ORDER — SODIUM CHLORIDE 0.9 % IV SOLN
10.0000 [IU]/m2 | Freq: Once | INTRAVENOUS | Status: AC
  Administered 2015-09-13: 24 [IU] via INTRAVENOUS
  Filled 2015-09-13: qty 8

## 2015-09-13 MED ORDER — HEPARIN SOD (PORK) LOCK FLUSH 100 UNIT/ML IV SOLN
500.0000 [IU] | Freq: Once | INTRAVENOUS | Status: AC | PRN
  Administered 2015-09-13: 500 [IU]

## 2015-09-13 MED ORDER — SODIUM CHLORIDE 0.9% FLUSH
10.0000 mL | INTRAVENOUS | Status: DC | PRN
  Administered 2015-09-13: 10 mL
  Filled 2015-09-13: qty 10

## 2015-09-13 MED ORDER — SODIUM CHLORIDE 0.9 % IV SOLN
Freq: Once | INTRAVENOUS | Status: AC
  Administered 2015-09-13: 11:00:00 via INTRAVENOUS
  Filled 2015-09-13: qty 5

## 2015-09-13 MED ORDER — SODIUM CHLORIDE 0.9 % IV SOLN
6.0000 mg/m2 | Freq: Once | INTRAVENOUS | Status: AC
  Administered 2015-09-13: 14.5 mg via INTRAVENOUS
  Filled 2015-09-13: qty 14.5

## 2015-09-13 MED ORDER — PALONOSETRON HCL INJECTION 0.25 MG/5ML
0.2500 mg | Freq: Once | INTRAVENOUS | Status: AC
  Administered 2015-09-13: 0.25 mg via INTRAVENOUS

## 2015-09-13 MED ORDER — SPIRONOLACTONE 50 MG PO TABS: 50.0000 mg | ORAL_TABLET | Freq: Every day | ORAL | 1 refills | Status: DC

## 2015-09-13 MED ORDER — DACARBAZINE 200 MG IV SOLR
375.0000 mg/m2 | Freq: Once | INTRAVENOUS | Status: AC
  Administered 2015-09-13: 900 mg via INTRAVENOUS
  Filled 2015-09-13: qty 15

## 2015-09-13 MED ORDER — SODIUM CHLORIDE 0.9 % IV SOLN
Freq: Once | INTRAVENOUS | Status: AC
  Administered 2015-09-13: 11:00:00 via INTRAVENOUS

## 2015-09-13 NOTE — Progress Notes (Signed)
1000-chemo education completed with pt at bedside

## 2015-09-13 NOTE — Patient Instructions (Signed)
[facility] Discharge Instructions for Patients Receiving Chemotherapy   Beginning January 23rd 2017 lab work for the [facility] will be done in the  Main lab at [facility] on 1st floor. If you have a lab appointment with the [facility] please come in thru the  Main Entrance and check in at the main information desk   Today you received the following chemotherapy agents Adria,Bleomycin DTIC and Vinblastine. Follow-up as scheduled. Call for any questions or concerns  To help prevent nausea and vomiting after your treatment, we encourage you to take your nausea medication   If you develop nausea and vomiting, or diarrhea that is not controlled by your medication, call the clinic.  The clinic phone number is [phone]. Office hours are Monday-Friday 8:30am-5:00pm.  BELOW ARE SYMPTOMS THAT SHOULD BE REPORTED IMMEDIATELY:  *FEVER GREATER THAN 101.0 F  *CHILLS WITH OR WITHOUT FEVER  NAUSEA AND VOMITING THAT IS NOT CONTROLLED WITH YOUR NAUSEA MEDICATION  *UNUSUAL SHORTNESS OF BREATH  *UNUSUAL BRUISING OR BLEEDING  TENDERNESS IN MOUTH AND THROAT WITH OR WITHOUT PRESENCE OF ULCERS  *URINARY PROBLEMS  *BOWEL PROBLEMS  UNUSUAL RASH Items with * indicate a potential emergency and should be followed up as soon as possible. If you have an emergency after office hours please contact your primary care physician or go to the nearest emergency department.  Please call the clinic during office hours if you have any questions or concerns.   You may also contact the Patient Navigator at [phone] should you have any questions or need assistance in obtaining follow up care.      Resources For Cancer Patients and their Caregivers ? American Cancer Society: Can assist with transportation, wigs, general needs, runs Look Good Feel Better.        817 095 2280 ? Cancer Care: Provides financial assistance, online support groups, medication/co-pay assistance.   1-800-813-HOPE 970 846 1662) ? [place] Assists [place] cancer patients and their families through emotional , educational and financial support.  308-819-8209 ? [place] DSS Where to apply for food stamps, Medicaid and utility assistance. 531-187-7193 ? [facility]: Transportation to medical appointments. (980) 496-7362 ? Social Security Administration: May apply for disability if have a Stage IV cancer. ([phone] 413-314-9024 ? [employer], Disability and Transit Services: Assists with nutrition, care and transit needs. [phone]

## 2015-09-13 NOTE — Assessment & Plan Note (Addendum)
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  Started ABVD on 09/13/2015 with curative intent.  Oncology history updated.  Pre-treatment labs: CBC diff, CMET, acute hepatitis panel.  I personally reviewed and went over laboratory results with the patient.  The results are noted within this dictation.  Chemotherapy teaching is completed today along with informed consent.  Labs in 2 weeks: Pre-treatment labs.  I have added a CD4 count as this will need to be monitored closely during therapy per Dr. Megan Salon.  She will receive 2 cycles of ABVD followed by re-imaging with PET.  Depending on PET results, future medical oncology recommendations will follow.  I learned today from pharmacy that Darunavir Etanolate ([name]) can increase the concentration of Vinblastine.  Pharmacy recommends the following: 1. Treat full dose and watch closely. 2. Empirically reduce the dose of Vinblastine, no recommendation for dose reduction amount. 3. Hold [name] while on treatment. I have discussed with Dr. Benay Spice in Dr. Donald Pore absence at this time.  He recommended full dose Vinblastine without any changes in antiretroviral medications.   I was able to get in touch with Dr. Whitney Muse after I spoke with Dr. Benay Spice who agreed with aforementioned plan.  She notes bilateral ankle swelling.  She notes it worsens as the day progresses and improves by AM.  She notes that this is new x 1 week.  Rx for Aldactone 50 mg is provided.  She is encouraged to elevate her feet when able.  Return in ~ 7 days for nadir check with labs: CBC diff, CMET and follow-up appointment.

## 2015-09-13 NOTE — Progress Notes (Signed)
Verified with Holly Crigler PA that no neulasta was needed to add to the first treatment

## 2015-09-13 NOTE — Addendum Note (Signed)
Addended by: Elenor Legato on: 09/13/2015 08:37 AM   Modules accepted: Orders

## 2015-09-13 NOTE — Progress Notes (Signed)
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  INTERVAL HISTORY: Holly Stephens 60 y.o. female returns for followup of Hodgkin's Lymphoma, nodular sclerosing-type, classical Hodgkin's Disease with lymphatic involvement in multiple lymph nodes superior and inferior to the diaphragm with PET imaging demonstrating findings concerning for pulmonary involvement (Stage III versus Stage IV disease ?pulmonary nodules x 2?).  She reports bilateral ankle edema for the past week. She knows that it is uncomfortable as the day progresses and her ankles swell even more. She notes improvement in her swelling upon awakening in the morning.  Otherwise, she denies any complaints. She is receiving chemotherapy teaching today.  Review of Systems  Constitutional: Negative for chills, fever and weight loss.  HENT: Negative.   Eyes: Negative.   Respiratory: Negative.   Cardiovascular: Positive for leg swelling (bilateral ankle swelling.). Negative for chest pain.  Gastrointestinal: Negative.   Genitourinary: Negative.   Musculoskeletal: Negative.   Skin: Negative.   Neurological: Negative.  Negative for weakness.  Endo/Heme/Allergies: Negative.   Psychiatric/Behavioral: Negative.     Past Medical History:  Diagnosis Date  . Arthritis    osteoarthritis  . Diabetes mellitus without complication ([place])   . HIV infection ([place])   . [name], nodular sclerosis ([place]) 08/23/2015  . Human immunodeficiency virus (HIV) disease ([place]) 02/23/2006   Qualifier: Diagnosis of  By: Megan Salon MD, John        Past Surgical History:  Procedure Laterality Date  . AXILLARY LYMPH NODE BIOPSY Left 07/23/2015   Procedure: EXCISIONAL BIOPSY LEFT AXILLARY LYMPH NODE;  Surgeon: [name], MD;  Location: [facility];  Service: General;  Laterality: Left;  . SHOULDER SURGERY Right    removal of bone spur    History reviewed. No pertinent family history.  Social History   Social History  . Marital status: Widowed    Spouse name: N/A  . Number of children: N/A  . Years of education: N/A   Social History Main Topics  . Smoking status: Never Smoker  . Smokeless tobacco: Never Used  . Alcohol use No  . Drug use: No  . Sexual activity: No     Comment: declined condoms   Other Topics Concern  . None   Social History Narrative  . None     PHYSICAL EXAMINATION  ECOG PERFORMANCE STATUS: 1 - Symptomatic but completely ambulatory  Vitals:   09/13/15 0956  BP: (!) 143/65  Pulse: 69  Resp: 18  Temp: 98.5 F (36.9 C)    GENERAL:alert, no distress, well nourished, well developed, comfortable, cooperative, obese, smiling and unaccompanied SKIN: skin color, texture, turgor are normal, no rashes or significant lesions HEAD: Normocephalic, No masses, lesions, tenderness or abnormalities EYES: normal, EOMI, Conjunctiva are pink and non-injected EARS: External ears normal OROPHARYNX:lips, buccal mucosa, and tongue normal and mucous membranes are moist  NECK: supple, trachea midline LYMPH:  not examined BREAST:not examined LUNGS: clear to auscultation  HEART: regular rate & rhythm ABDOMEN:obese BACK: Back symmetric, no curvature. EXTREMITIES:less then 2 second capillary refill, no joint deformities, effusion, or inflammation, no skin discoloration, no clubbing, no cyanosis, positive findings:  edema Bilateral ankle edema, 1-2+ pitting. No erythema, heat noted on exam. Tenderness to deep palpation.  NEURO: alert & oriented x 3 with fluent speech, no focal motor/sensory deficits, gait  normal    LABORATORY DATA: CBC    Component Value Date/Time   WBC 16.3 (H) 09/13/2015 1119   RBC 4.16 09/13/2015 1119   HGB 12.2 09/13/2015 1119   HCT 38.4 09/13/2015 1119   PLT 235 09/13/2015 1119   MCV 92.3 09/13/2015 1119   MCH 29.3 09/13/2015 1119   MCHC 31.8 09/13/2015 1119   RDW 14.3 09/13/2015 1119   LYMPHSABS 3.1 09/13/2015 1119   MONOABS 0.9 09/13/2015 1119   EOSABS 0.2 09/13/2015 1119   BASOSABS 0.0 09/13/2015 1119      Chemistry      Component Value Date/Time   NA 139 09/13/2015 1119   K 3.7 09/13/2015 1119   CL 107 09/13/2015 1119   CO2 25 09/13/2015 1119   BUN 10 09/13/2015 1119   CREATININE 0.54 09/13/2015 1119   CREATININE 0.52 06/01/2015 0900      Component Value Date/Time   CALCIUM 9.6 09/13/2015 1119   ALKPHOS 54 09/13/2015 1119   AST 26 09/13/2015 1119   ALT 17 09/13/2015 1119   BILITOT 0.4 09/13/2015 1119        PENDING LABS:   RADIOGRAPHIC STUDIES:  Nm Cardiac Muga Rest  Result Date: 09/01/2015 CLINICAL DATA:  Nodular sclerosing Hodgkin's lymphoma of axillary lymph nodes, planned Adriamycin chemotherapy EXAM: NUCLEAR MEDICINE CARDIAC BLOOD POOL IMAGING (MUGA) TECHNIQUE: Cardiac multi-gated acquisition was performed at rest following intravenous injection of Tc-52m labeled red blood cells. RADIOPHARMACEUTICALS:  25 mCi Tc-42m in-vitro labeled red blood cells IV COMPARISON:  None FINDINGS: Calculated LEFT ventricular ejection fraction is 73%, within the normal range. Normal left ventricular wall motion on cine analysis of the gated blood pool in 3 projections. IMPRESSION: Normal LEFT ventricular ejection fraction of 73%. Normal LEFT ventricular wall motion. Electronically Signed   By: Lavonia Dana M.D.   On: 09/01/2015 14:53   Nm Pet Image Initial (pi) Skull Base To Thigh  Result Date: 08/31/2015 CLINICAL DATA:  Initial treatment strategy for Hodgkin's lymphoma. Additional history of HIV infection. EXAM: NUCLEAR MEDICINE PET SKULL BASE TO  THIGH TECHNIQUE: 14.6 mCi F-18 FDG was injected intravenously. Full-ring PET imaging was performed from the skull base to thigh after the radiotracer. CT data was obtained and used for attenuation correction and anatomic localization. FASTING BLOOD GLUCOSE:  Value: 145 mg/dl COMPARISON:  None. FINDINGS: NECK Hypermetabolic LEFT supraclavicular lymph nodes with SUV max equal 8.2. CHEST Bulky hypermetabolic LEFT axillary lymph nodes measure up to 2.8 cm short axis with SUV max equal 12.2. Smaller hypermetabolic RIGHT axillary nodes. Hypermetabolic mediastinal lymph nodes and RIGHT hilar lymph nodes. internal mammary lymph node on the LEFT Measures 12 mm (image 57 series 4) with SUV max equal 10.0. Subcarinal lymph node SUV max equal 11.8. LEFT lower lobe nodule measures 7 mm (image 32, series 6) without clear metabolic activity. RIGHT upper lobe nodule measures 8 mm (image 25 series 6) also without clear metabolic activity. ABDOMEN/PELVIS Bulky periportal and periaortic retroperitoneal hypermetabolic lymph nodes. Periportal lymph node with SUV max equals 12.2 and LEFT periaortic lymph node with SUV max equal 9.5. Adenopathy extends into the LEFT external iliac and inguinal lymph nodes. Hypermetabolic LEFT inguinal lymph node measures 2.0 cm short axis with SUV max equal 18.5. There is a large well-circumscribed fat lesion within the subcutaneous tissue in the deep to  the umbilicus. This connects with the peritoneal space and most consistent 10 cm umbilical hernia. The hernia contains only fat. Mild peripheral stranding. SKELETON Scattered moderately hypermetabolic marrow activity without focal lesion. IMPRESSION: 1. Hypermetabolic lymphadenopathy consistent lymphoma. Nodal stations include the LEFT supraclavicular nodes, LEFT axillary nodes, mediastinal lymph nodes, periaortic retroperitoneal and periportal lymph nodes as well as LEFT iliac and inguinal lymph nodes. 2. Two pulmonary nodules while not hypermetabolic  are concerning for lymphoma involvement of the lung parenchyma. 3. No convincing evidence of bone marrow involvement. Mild heterogeneous metabolic marrow activity. 4. Incidental note of large periumbilical hernia. Electronically Signed   By: Suzy Bouchard M.D.   On: 08/31/2015 09:18   Dg Chest Port 1 View  Result Date: 09/08/2015 CLINICAL DATA:  Port-A-Cath insertion. Nodular sclerosing Hodgkin's lymphoma EXAM: DG C-ARM 1-60 MIN-NO REPORT; PORTABLE CHEST - 1 VIEW COMPARISON:  None. FLUOROSCOPY TIME:  0 minutes 33 seconds; no fluoroscopic image acquired. FINDINGS: Port-A-Cath tip is in the superior vena cava. No pneumothorax. There is cardiomegaly with pulmonary venous hypertension. There is interstitial edema. There is no appreciable airspace consolidation. There is questionable left hilar adenopathy. No bone lesions. IMPRESSION: Port-A-Cath tip in superior vena cava. No pneumothorax. Findings indicative of congestive heart failure. Question left hilar adenopathy. Electronically Signed   By: Lowella Grip III M.D.   On: 09/08/2015 09:42   Dg C-arm 1-60 Min-no Report  Result Date: 09/08/2015 CLINICAL DATA: PAC insertion C-ARM 1-60 MINUTES Fluoroscopy was utilized by the requesting physician.  No radiographic interpretation.     PATHOLOGY:    ASSESSMENT AND PLAN:  [name], nodular sclerosis (H[facility]) Hodgkin's Lymphoma, nodular sclerosing-type, classical Hodgkin's Disease with lymphatic involvement in multiple lymph nodes superior and inferior to the diaphragm with PET imaging demonstrating findings concerning for pulmonary involvement (Stage III versus Stage IV disease ?pulmonary nodules x 2?).  Started ABVD on 09/13/2015 with curative intent.  Oncology history updated.  Pre-treatment labs: CBC diff, CMET, acute hepatitis panel.  I personally reviewed and went over laboratory results with the patient.  The results are noted within this dictation.  Chemotherapy teaching is completed  today along with informed consent.  Labs in 2 weeks: Pre-treatment labs.  I have added a CD4 count as this will need to be monitored closely during therapy per Dr. Megan Salon.  She will receive 2 cycles of ABVD followed by re-imaging with PET.  Depending on PET results, future medical oncology recommendations will follow.  I learned today from pharmacy that Darunavir Etanolate ([name]) can increase the concentration of Vinblastine.  Pharmacy recommends the following: 1. Treat full dose and watch closely. 2. Empirically reduce the dose of Vinblastine, no recommendation for dose reduction amount. 3. Hold [name] while on treatment. I have discussed with Dr. Benay Spice in Dr. Donald Pore absence at this time.  He recommended full dose Vinblastine without any changes in antiretroviral medications.   I was able to get in touch with Dr. Whitney Muse after I spoke with Dr. Benay Spice who agreed with aforementioned plan.  She notes bilateral ankle swelling.  She notes it worsens as the day progresses and improves by AM.  She notes that this is new x 1 week.  Rx for Aldactone 50 mg is provided.  She is encouraged to elevate her feet when able.  Return in ~ 7 days for nadir check with labs: CBC diff, CMET and follow-up appointment.  Human immunodeficiency virus (HIV) disease ([place]) HIV +, well controlled.  Followed by Dr. Megan Salon.   Dr. Megan Salon  is advised of Hodgkin's Lymphoma diagnosis.  HIV is well controlled and close monitoring of CD4 counts is recommended by Dr. Megan Salon.  Patient is to continue on anti-retroviral medications as directed.   ORDERS PLACED FOR THIS ENCOUNTER: Orders Placed This Encounter  Procedures  . T-helper cells (CD4) count  . Hepatitis panel, acute  . CBC with Differential  . Comprehensive metabolic panel    MEDICATIONS PRESCRIBED THIS ENCOUNTER: Meds ordered this encounter  Medications  . spironolactone (ALDACTONE) 50 MG tablet    Sig: Take 1 tablet (50 mg total) by mouth  daily.    Dispense:  14 tablet    Refill:  1    Order Specific Question:   Supervising Provider    Answer:   Patrici Ranks [id number]    THERAPY PLAN:  ABVD with curative intent 2 cycles followed by PET imaging to evaluate response.  All questions were answered. The patient knows to call the clinic with any problems, questions or concerns. We can certainly see the patient much sooner if necessary.  Patient and plan discussed with Dr. Ancil Linsey and she is in agreement with the aforementioned.   This note is electronically signed by: Doy Mince 09/13/2015 12:51 PM

## 2015-09-13 NOTE — Progress Notes (Signed)
Pt tolerated ABVD chemo tx well today. Pt discharged ambulatory in satisfactory condition with friend

## 2015-09-13 NOTE — Assessment & Plan Note (Signed)
HIV +, well controlled.  Followed by Dr. Megan Salon.   Dr. Megan Salon is advised of Hodgkin's Lymphoma diagnosis.  HIV is well controlled and close monitoring of CD4 counts is recommended by Dr. Megan Salon.  Patient is to continue on anti-retroviral medications as directed.

## 2015-09-13 NOTE — Patient Instructions (Signed)
[place] at [facility] Discharge Instructions  RECOMMENDATIONS MADE BY THE CONSULTANT AND ANY TEST RESULTS WILL BE SENT TO YOUR REFERRING PHYSICIAN.  You saw [name], PA-C today. He sent a prescription for aldactone to your pharmacy. Return in 1 week for labs and follow up. Treatment again in 2 weeks.  Thank you for choosing [place] at [facility] to provide your oncology and hematology care.  To afford each patient quality time with our provider, please arrive at least 15 minutes before your scheduled appointment time.   Beginning January 23rd 2017 lab work for the [facility] will be done in the  Main lab at [facility] on 1st floor. If you have a lab appointment with the [facility] please come in thru the  Main Entrance and check in at the main information desk  You need to re-schedule your appointment should you arrive 10 or more minutes late.  We strive to give you quality time with our providers, and arriving late affects you and other patients whose appointments are after yours.  Also, if you no show three or more times for appointments you may be dismissed from the clinic at the providers discretion.     Again, thank you for choosing [facility].  Our hope is that these requests will decrease the amount of time that you wait before being seen by our physicians.       _____________________________________________________________  Should you have questions after your visit to [facility], please contact our office at [phone] between the hours of 8:30 a.m. and 4:30 p.m.  Voicemails left after 4:30 p.m. will not be returned until the following business day.  For prescription refill requests, have your pharmacy contact our office.         Resources For Cancer Patients and their Caregivers ? American Cancer Society: Can assist with transportation, wigs, general needs, runs Look Good Feel  Better.        762-795-9746 ? Cancer Care: Provides financial assistance, online support groups, medication/co-pay assistance.  1-800-813-HOPE 303-710-0540) ? [facility] Assists [place] cancer patients and their families through emotional , educational and financial support.  (971)316-4397 ? [place] DSS Where to apply for food stamps, Medicaid and utility assistance. 380-789-5800 ? [facility]: Transportation to medical appointments. 409-463-0654 ? Social Security Administration: May apply for disability if have a Stage IV cancer. (412)652-8248 (228)688-1653 ? [employer], Disability and Transit Services: Assists with nutrition, care and transit needs. [place] Support Programs: @10RELATIVEDAYS @ > Cancer Support Group  2nd Tuesday of the month 1pm-2pm, Journey Room  > Creative Journey  3rd Tuesday of the month 1130am-1pm, Journey Room  > Look Good Feel Better  1st Wednesday of the month 10am-12 noon, Journey Room (Call [place] to register [phone])

## 2015-09-14 ENCOUNTER — Other Ambulatory Visit (HOSPITAL_COMMUNITY): Payer: Self-pay | Admitting: Emergency Medicine

## 2015-09-14 ENCOUNTER — Encounter (HOSPITAL_COMMUNITY): Payer: Self-pay | Admitting: Surgery

## 2015-09-14 ENCOUNTER — Telehealth (HOSPITAL_COMMUNITY): Payer: Self-pay | Admitting: *Deleted

## 2015-09-14 LAB — HEPATITIS PANEL, ACUTE
HEP A IGM: NEGATIVE
HEP B C IGM: NEGATIVE
Hepatitis B Surface Ag: NEGATIVE

## 2015-09-14 NOTE — Progress Notes (Signed)
24 hour call back- pt states that she is doing very well, she is starting to have some dry mouth. I am going to fax to work a note for her to be able to wear a mask if she needs to.

## 2015-09-20 ENCOUNTER — Encounter (HOSPITAL_COMMUNITY): Payer: 59

## 2015-09-20 ENCOUNTER — Telehealth (HOSPITAL_COMMUNITY): Payer: Self-pay | Admitting: *Deleted

## 2015-09-20 DIAGNOSIS — C8118 Nodular sclerosis classical Hodgkin lymphoma, lymph nodes of multiple sites: Secondary | ICD-10-CM | POA: Diagnosis not present

## 2015-09-20 DIAGNOSIS — C8114 Nodular sclerosis classical Hodgkin lymphoma, lymph nodes of axilla and upper limb: Secondary | ICD-10-CM

## 2015-09-20 LAB — CBC WITH DIFFERENTIAL/PLATELET
BASOS ABS: 0 10*3/uL (ref 0.0–0.1)
Basophils Relative: 0 %
EOS PCT: 4 %
Eosinophils Absolute: 0.2 10*3/uL (ref 0.0–0.7)
HEMATOCRIT: 40.6 % (ref 36.0–46.0)
Hemoglobin: 13.1 g/dL (ref 12.0–15.0)
LYMPHS ABS: 2.5 10*3/uL (ref 0.7–4.0)
LYMPHS PCT: 42 %
MCH: 29.1 pg (ref 26.0–34.0)
MCHC: 32.3 g/dL (ref 30.0–36.0)
MCV: 90.2 fL (ref 78.0–100.0)
MONO ABS: 0 10*3/uL — AB (ref 0.1–1.0)
Monocytes Relative: 1 %
NEUTROS ABS: 3.3 10*3/uL (ref 1.7–7.7)
Neutrophils Relative %: 53 %
Platelets: 220 10*3/uL (ref 150–400)
RBC: 4.5 MIL/uL (ref 3.87–5.11)
RDW: 13.4 % (ref 11.5–15.5)
WBC: 6.1 10*3/uL (ref 4.0–10.5)

## 2015-09-20 LAB — COMPREHENSIVE METABOLIC PANEL
ALT: 47 U/L (ref 14–54)
AST: 34 U/L (ref 15–41)
Albumin: 3.8 g/dL (ref 3.5–5.0)
Alkaline Phosphatase: 39 U/L (ref 38–126)
Anion gap: 5 (ref 5–15)
BILIRUBIN TOTAL: 0.6 mg/dL (ref 0.3–1.2)
BUN: 17 mg/dL (ref 6–20)
CALCIUM: 9.1 mg/dL (ref 8.9–10.3)
CO2: 27 mmol/L (ref 22–32)
CREATININE: 0.52 mg/dL (ref 0.44–1.00)
Chloride: 104 mmol/L (ref 101–111)
Glucose, Bld: 120 mg/dL — ABNORMAL HIGH (ref 65–99)
Potassium: 4.1 mmol/L (ref 3.5–5.1)
Sodium: 136 mmol/L (ref 135–145)
TOTAL PROTEIN: 7.3 g/dL (ref 6.5–8.1)

## 2015-09-22 ENCOUNTER — Encounter (HOSPITAL_COMMUNITY): Payer: Self-pay | Admitting: Hematology & Oncology

## 2015-09-22 ENCOUNTER — Encounter (HOSPITAL_COMMUNITY): Payer: 59 | Attending: Hematology & Oncology | Admitting: Hematology & Oncology

## 2015-09-22 VITALS — BP 149/67 | HR 70 | Temp 97.5°F | Resp 18 | Wt 279.0 lb

## 2015-09-22 DIAGNOSIS — R197 Diarrhea, unspecified: Secondary | ICD-10-CM | POA: Diagnosis not present

## 2015-09-22 DIAGNOSIS — B20 Human immunodeficiency virus [HIV] disease: Secondary | ICD-10-CM

## 2015-09-22 DIAGNOSIS — R530 Neoplastic (malignant) related fatigue: Secondary | ICD-10-CM

## 2015-09-22 DIAGNOSIS — C8114 Nodular sclerosis classical Hodgkin lymphoma, lymph nodes of axilla and upper limb: Secondary | ICD-10-CM

## 2015-09-22 DIAGNOSIS — C8118 Nodular sclerosis classical Hodgkin lymphoma, lymph nodes of multiple sites: Secondary | ICD-10-CM | POA: Insufficient documentation

## 2015-09-22 MED ORDER — PANTOPRAZOLE SODIUM 40 MG PO TBEC: 40.0000 mg | DELAYED_RELEASE_TABLET | Freq: Every day | ORAL | 2 refills | Status: DC

## 2015-09-22 NOTE — Progress Notes (Signed)
Holly Neighbors, MD [place] [address]    [name], nodular sclerosis ([place])   07/23/2015 Procedure    Lext axilla lymph node biopsy      07/27/2015 Pathology Results    Classical [name], nodular sclerosis type.  Positive CD30, CD15, andPAX-5.  Negative CD45 and CD20.      08/31/2015 PET scan    Hypermetabolic lymphadenopathy. Nodal stations include: L supraclavicular nodes, L axillary nodes, mediastinal lymph nodes, periaortic retroperitoneal and periportal lymph nodes as well as L iliac and inguinal lymph nodes. Two concerning pulm nodules...      09/01/2015 Imaging    MUGA- Normal LEFT ventricular ejection fraction of 73%.  Normal LEFT ventricular wall motion.      09/01/2015 Procedure    PFTs-  Spirometry shows mild ventilatory defect without airflow obstruction and no bronchodilator improvement. Lung volumes are normal. Airway resistance is normal. DLCO normal. Essentially normal lung function      09/08/2015 Procedure    Port placed by Dr. Rosana Hoes      09/13/2015 -  Chemotherapy    ABVD                 CURRENT THERAPY: ABVD  INTERVAL HISTORY: Holly Stephens 60 y.o. female returns for followup of Hodgkin's Lymphoma, nodular sclerosing-type, classical Hodgkin's Disease with lymphatic involvement in multiple lymph nodes superior and inferior to the diaphragm with PET imaging demonstrating findings concerning for pulmonary involvement (Stage III versus Stage IV disease ?pulmonary nodules x 2?).  Holly Stephens is unaccompanied. She began to cry while speaking of her side effects.  The fatigue after treatment was worse than she thought it would be. She also reports food tastes awful after treatment.  She rests a lot. Some days are not as bad as others, "But some days I am just so tired". This fatigue comes and goes. Right after treatment, she felt good but it became tougher. She has noticed if she rests she feels better. States, "It  would be easier if I wasn't so tired". When fatigued she doesn't feel like doing much of anything.   She has been trying different foods to find things she can enjoy. Sometimes food has a metallic taste. She has been using plastic silverware which has helped. She also switched to using a plastic water bottle than her previous stainless steel bottle. She believes she may also be feeling weak because she isn't eating as much. She has been drinking a lot of water and Cheez-Its taste good. She has been trying to eat more vegetables.  Her friends brought her some Boost and she drank some this morning which tasted alright.   She reports having a feeling in the pit of her stomach which she attributes to her stomach being empty most of the time. She is not taking Protonix.  She felt great the first day after she left the hospital after receiving chemotherapy.   She plans on speaking with her boss at work because she is not sure if she'll be able to continue working. She did try working and it was tough. The long drive to [facility] was hard and being in the heat was difficult.   She does not believe she is depressed, "I just feel bad".  Her leg swelling has resolved.   She reports shortness of breath with exertion.   She reports some diarrhea and has not taken any imodium. On Saturday, she was not able  to make it to the bathroom in time. She has had some diarrhea since this episode, "Though there isn't much to get out".  She denies any nausea or vomiting.    Review of Systems  Constitutional: Positive for malaise/fatigue. Negative for chills, fever and weight loss.       Loss in appetite  HENT: Negative.   Eyes: Negative.   Respiratory: Positive for shortness of breath.        Shortness of breath with exertion  Cardiovascular: Negative for chest pain.  Gastrointestinal: Positive for diarrhea. Negative for nausea and vomiting.  Genitourinary: Negative.  Negative for dysuria.  Musculoskeletal:  Negative.   Skin: Negative.   Neurological: Positive for weakness.  Endo/Heme/Allergies: Negative.   Psychiatric/Behavioral: Negative.   14 point review of systems was performed and is negative except as detailed under history of present illness and above   Past Medical History:  Diagnosis Date  . Arthritis    osteoarthritis  . Diabetes mellitus without complication ([place])   . HIV infection ([place])   . [name], nodular sclerosis ([place]) 08/23/2015  . Human immunodeficiency virus (HIV) disease ([place]) 02/23/2006   Qualifier: Diagnosis of  By: Megan Salon MD, John      Past Surgical History:  Procedure Laterality Date  . AXILLARY LYMPH NODE BIOPSY Left 07/23/2015   Procedure: EXCISIONAL BIOPSY LEFT AXILLARY LYMPH NODE;  Surgeon: [name], MD;  Location: [facility];  Service: General;  Laterality: Left;  . PORTACATH PLACEMENT Right 09/08/2015   Procedure: INSERTION OF CENTRAL VENOUS CATHETER WITH PORT FOR CHEMOTHERAPY;  Surgeon: [name], MD;  Location: [facility];  Service: General;  Laterality: Right;  . SHOULDER SURGERY Right    removal of bone spur    History reviewed. No pertinent family history.  Social History   Social History  . Marital status: Widowed    Spouse name: N/A  . Number of children: N/A  . Years of education: N/A   Social History Main Topics  . Smoking status: Never Smoker  . Smokeless tobacco: Never Used  . Alcohol use No  . Drug use: No  . Sexual activity: No     Comment: declined condoms   Other Topics Concern  . None   Social History Narrative  . None     PHYSICAL EXAMINATION  ECOG PERFORMANCE STATUS: 1 - Symptomatic but completely ambulatory  Vitals:   09/22/15 1118  BP: (!) 149/67  Pulse: 70  Resp: 18  Temp: 97.5 F (36.4 C)    GENERAL:alert, no distress, well nourished, well developed, comfortable, cooperative, obese SKIN: skin color, texture, turgor are normal, no rashes or significant lesions HEAD: Normocephalic, No  masses, lesions, tenderness or abnormalities EYES: normal, EOMI, Conjunctiva are pink and non-injected EARS: External ears normal OROPHARYNX:lips, buccal mucosa, and tongue normal and mucous membranes are moist  NECK: supple, trachea midline LYMPH:  not examined BREAST:not examined LUNGS: clear to auscultation  HEART: regular rate & rhythm ABDOMEN:obese BACK: Back symmetric, no curvature. EXTREMITIES:less then 2 second capillary refill, no joint deformities, effusion, or inflammation, no skin discoloration, no clubbing, no cyanosis NEURO: alert & oriented x 3 with fluent speech, no focal motor/sensory deficits, gait normal    LABORATORY DATA: I have reviewed the data as listed. CBC    Component Value Date/Time   WBC 6.1 09/20/2015 1342   RBC 4.50 09/20/2015 1342   HGB 13.1 09/20/2015 1342   HCT 40.6 09/20/2015 1342   PLT 220 09/20/2015 1342   MCV  90.2 09/20/2015 1342   MCH 29.1 09/20/2015 1342   MCHC 32.3 09/20/2015 1342   RDW 13.4 09/20/2015 1342   LYMPHSABS 2.5 09/20/2015 1342   MONOABS 0.0 (L) 09/20/2015 1342   EOSABS 0.2 09/20/2015 1342   BASOSABS 0.0 09/20/2015 1342      Chemistry      Component Value Date/Time   NA 136 09/20/2015 1342   K 4.1 09/20/2015 1342   CL 104 09/20/2015 1342   CO2 27 09/20/2015 1342   BUN 17 09/20/2015 1342   CREATININE 0.52 09/20/2015 1342   CREATININE 0.52 06/01/2015 0900      Component Value Date/Time   CALCIUM 9.1 09/20/2015 1342   ALKPHOS 39 09/20/2015 1342   AST 34 09/20/2015 1342   ALT 47 09/20/2015 1342   BILITOT 0.6 09/20/2015 1342       RADIOGRAPHIC STUDIES: I have personally reviewed the radiological images as listed and agreed with the findings in the report.  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 RADIOPHARMACEUTICALS:  25 mCi Tc-916mn-vitro labeled red blood cells IV COMPARISON:  None FINDINGS: Calculated LEFT ventricular ejection fraction is 73%, within the normal range. Normal left ventricular wall motion on cine analysis of the gated blood pool in 3 projections. IMPRESSION: Normal LEFT ventricular ejection fraction of 73%. Normal LEFT ventricular wall motion. Electronically Signed   By: MaLavonia Dana.D.   On: 09/01/2015 14:53   Nm Pet Image Initial (pi) Skull Base To Thigh  Result Date: 08/31/2015 CLINICAL DATA:  Initial treatment strategy for Hodgkin's lymphoma. Additional history of HIV infection. EXAM: NUCLEAR MEDICINE PET SKULL BASE TO THIGH TECHNIQUE: 14.6 mCi F-18 FDG was injected intravenously. Full-ring PET imaging was performed from the skull base to thigh after the radiotracer. CT data was obtained and used for attenuation correction and anatomic localization. FASTING BLOOD GLUCOSE:  Value: 145 mg/dl COMPARISON:  None. FINDINGS: NECK Hypermetabolic LEFT supraclavicular lymph nodes with SUV max equal 8.2. CHEST Bulky hypermetabolic LEFT axillary lymph nodes measure up to 2.8 cm short axis with SUV max equal 12.2. Smaller hypermetabolic RIGHT axillary nodes. Hypermetabolic mediastinal lymph nodes and RIGHT hilar lymph nodes. internal mammary lymph node on the LEFT Measures 12 mm (image 57 series 4) with SUV max equal 10.0. Subcarinal lymph node SUV max equal 11.8. LEFT lower lobe nodule measures 7 mm (image 32, series 6) without clear metabolic activity. RIGHT upper lobe nodule measures 8 mm (image 25 series 6) also without clear metabolic activity. ABDOMEN/PELVIS Bulky periportal and periaortic retroperitoneal hypermetabolic lymph nodes. Periportal lymph node with SUV max equals 12.2 and LEFT periaortic lymph node with SUV max equal 9.5. Adenopathy extends into the LEFT external iliac and inguinal lymph  nodes. Hypermetabolic LEFT inguinal lymph node measures 2.0 cm short axis with SUV max equal 18.5. There is a large well-circumscribed fat lesion within the subcutaneous tissue in the deep to the umbilicus. This connects with the peritoneal space and most consistent 10 cm umbilical hernia. The hernia contains only fat. Mild peripheral stranding. SKELETON Scattered moderately hypermetabolic marrow activity without focal lesion. IMPRESSION: 1. Hypermetabolic lymphadenopathy consistent lymphoma. Nodal stations include the LEFT supraclavicular nodes, LEFT axillary nodes, mediastinal lymph nodes, periaortic retroperitoneal and periportal lymph nodes as well as LEFT iliac and inguinal lymph  nodes. 2. Two pulmonary nodules while not hypermetabolic are concerning for lymphoma involvement of the lung parenchyma. 3. No convincing evidence of bone marrow involvement. Mild heterogeneous metabolic marrow activity. 4. Incidental note of large periumbilical hernia. Electronically Signed   By: Suzy Bouchard M.D.   On: 08/31/2015 09:18   Dg Chest Port 1 View  Result Date: 09/08/2015 CLINICAL DATA:  Port-A-Cath insertion. Nodular sclerosing Hodgkin's lymphoma EXAM: DG C-ARM 1-60 MIN-NO REPORT; PORTABLE CHEST - 1 VIEW COMPARISON:  None. FLUOROSCOPY TIME:  0 minutes 33 seconds; no fluoroscopic image acquired. FINDINGS: Port-A-Cath tip is in the superior vena cava. No pneumothorax. There is cardiomegaly with pulmonary venous hypertension. There is interstitial edema. There is no appreciable airspace consolidation. There is questionable left hilar adenopathy. No bone lesions. IMPRESSION: Port-A-Cath tip in superior vena cava. No pneumothorax. Findings indicative of congestive heart failure. Question left hilar adenopathy. Electronically Signed   By: Lowella Grip III M.D.   On: 09/08/2015 09:42   Dg C-arm 1-60 Min-no Report  Result Date: 09/08/2015 CLINICAL DATA: PAC insertion C-ARM 1-60 MINUTES Fluoroscopy was utilized by  the requesting physician.  No radiographic interpretation.    PATHOLOGY: Diagnosis Lymph node for lymphoma, left axillary - CLASSICAL [name], NODULAR SCLEROSIS TYPE.    ASSESSMENT AND PLAN:  [name], nodular sclerosis (HCC) Stage IIIA  Obesity HIV Altered tasted secondary to chemotherapy Chemotherapy related fatigue diarrhea   Labs on 09/20/15 reviewed.   She complains of fatigue, loss in appetite, and diarrhea. No nausea or vomiting. She will be scheduled to receive IV fluids. We reviewed imodium use again. She has an imodium sheet.  Given her abdominal complaints I have written for a PPI, I do not see a contraindication with her HIV medications. She was advised to take it daily.   I have referred her to Abby Potash and have asked her to consider support groups throughout therapy.   I emphasized the importance of notifying us with difficulties and problems. I reminded her that we are here to help with symptoms related to therapy. She has met with Anderson Malta our navigator and has her number.   She will return for follow up with [name], PA-C on 10/11/15.  THERAPY PLAN:  ABVD with curative intent 2 cycles followed by PET imaging to evaluate response.  All questions were answered. The patient knows to call the clinic with any problems, questions or concerns. We can certainly see the patient much sooner if necessary.  This document serves as a record of services personally performed by Ancil Linsey, MD. It was created on her behalf by Arlyce Harman, a trained medical scribe. The creation of this record is based on the scribe's personal observations and the provider's statements to them. This document has been checked and approved by the attending provider.  I have reviewed the above documentation for accuracy and completeness, and I agree with the above.  This note is electronically signed [id number] Cyril Mourning, MD  09/22/2015 11:24 AM

## 2015-09-22 NOTE — Progress Notes (Signed)
Meet with pt at her follow up visit.  Pt very tearful at her visit today.  Pt is very fatigued and cant eat very well.  Several different samples of boost and ensure given to her.  Loren Racer CSW was contacted.  Pt will be seen again before chemotherapy on Monday.  We will also give her fluids next Friday after chemotherapy to see if it helps her.  Protonix called into pts pharmacy for her.

## 2015-09-22 NOTE — Patient Instructions (Signed)
[place] at [facility] Discharge Instructions  RECOMMENDATIONS MADE BY THE CONSULTANT AND ANY TEST RESULTS WILL BE SENT TO YOUR REFERRING PHYSICIAN.  Exam and discussion by Dr Whitney Muse today Holly Stephens our social worker a note she will be in contact with you Fluids next week after chemotherapy on friday Return to see the doctor next week with chemotherapy Protonix sent to your pharmacy  Please call the clinic if you have any questions or concerns   Thank you for choosing [place] at [facility] to provide your oncology and hematology care.  To afford each patient quality time with our provider, please arrive at least 15 minutes before your scheduled appointment time.   Beginning January 23rd 2017 lab work for the [facility] will be done in the  Main lab at [facility] on 1st floor. If you have a lab appointment with the [facility] please come in thru the  Main Entrance and check in at the main information desk  You need to re-schedule your appointment should you arrive 10 or more minutes late.  We strive to give you quality time with our providers, and arriving late affects you and other patients whose appointments are after yours.  Also, if you no show three or more times for appointments you may be dismissed from the clinic at the providers discretion.     Again, thank you for choosing [facility].  Our hope is that these requests will decrease the amount of time that you wait before being seen by our physicians.       _____________________________________________________________  Should you have questions after your visit to [facility], please contact our office at [phone] between the hours of 8:30 a.m. and 4:30 p.m.  Voicemails left after 4:30 p.m. will not be returned until the following business day.  For prescription refill requests, have your pharmacy contact our office.         Resources  For Cancer Patients and their Caregivers ? American Cancer Society: Can assist with transportation, wigs, general needs, runs Look Good Feel Better.        334-612-0303 ? Cancer Care: Provides financial assistance, online support groups, medication/co-pay assistance.  1-800-813-HOPE 5152078336) ? [place] Assists [place] cancer patients and their families through emotional , educational and financial support.  410 717 3929 ? [place] DSS Where to apply for food stamps, Medicaid and utility assistance. 787-327-6742 ? RCATS: Transportation to medical appointments. 346-735-8933 ? Social Security Administration: May apply for disability if have a Stage IV cancer. 619-764-5887 (702)817-0559 ? [employer], Disability and Transit Services: Assists with nutrition, care and transit needs. [place] Support Programs: @10RELATIVEDAYS @ > Cancer Support Group  2nd Tuesday of the month 1pm-2pm, Journey Room  > Creative Journey  3rd Tuesday of the month 1130am-1pm, Journey Room  > Look Good Feel Better  1st Wednesday of the month 10am-12 noon, Journey Room (Call [place] to register [phone])

## 2015-09-23 ENCOUNTER — Telehealth (HOSPITAL_COMMUNITY): Payer: Self-pay | Admitting: *Deleted

## 2015-09-23 ENCOUNTER — Encounter (HOSPITAL_COMMUNITY): Payer: Self-pay | Admitting: Oncology

## 2015-09-23 NOTE — Telephone Encounter (Signed)
Returned patient phone call to tell her I have the letter and will fax this the number.

## 2015-09-24 ENCOUNTER — Encounter: Payer: Self-pay | Admitting: *Deleted

## 2015-09-24 NOTE — Progress Notes (Signed)
[place] Clinical Social Work  Clinical Social Work was referred by Futures trader for assessment of psychosocial needs due to pt being tearful. Clinical Social Worker contacted patient at home to offer support and assess for needs. Pt reports to be doing pretty well, but has had some rough days physically that have led to emotional reactions. Pt feels good today and thinks she will be more prepared for her next round of chemo. She is pursuing ST disability through work and CSW discussed how to pursue.   CSW discussed common reactions to illness and discussed coping techniques. Pt also educated about resource of dietician to address taste concerns. Pt encouraged to attend support programs for additional support. She reports to have strong support from family and friends that plan to come with her to each treatment. Pt encouraged to reach out as needed to CSW.      Clinical Social Work interventions: Supportive Airline pilot education and referral  [place] Sarenity Ramaker, [place] Tuesdays   [phone]

## 2015-09-27 ENCOUNTER — Encounter (HOSPITAL_BASED_OUTPATIENT_CLINIC_OR_DEPARTMENT_OTHER): Payer: 59

## 2015-09-27 ENCOUNTER — Ambulatory Visit (HOSPITAL_COMMUNITY)
Admission: RE | Admit: 2015-09-27 | Discharge: 2015-09-27 | Disposition: A | Discharge status: Home / Self Care | Payer: 59 | Source: Ambulatory Visit | Attending: Oncology | Admitting: Oncology

## 2015-09-27 ENCOUNTER — Encounter (HOSPITAL_BASED_OUTPATIENT_CLINIC_OR_DEPARTMENT_OTHER): Payer: 59 | Admitting: Oncology

## 2015-09-27 VITALS — BP 116/54 | HR 95 | Temp 97.9°F | Resp 20 | Wt 276.5 lb

## 2015-09-27 DIAGNOSIS — J90 Pleural effusion, not elsewhere classified: Secondary | ICD-10-CM | POA: Insufficient documentation

## 2015-09-27 DIAGNOSIS — C8118 Nodular sclerosis classical Hodgkin lymphoma, lymph nodes of multiple sites: Secondary | ICD-10-CM

## 2015-09-27 DIAGNOSIS — R0602 Shortness of breath: Secondary | ICD-10-CM | POA: Insufficient documentation

## 2015-09-27 DIAGNOSIS — R590 Localized enlarged lymph nodes: Secondary | ICD-10-CM | POA: Diagnosis not present

## 2015-09-27 DIAGNOSIS — C8114 Nodular sclerosis classical Hodgkin lymphoma, lymph nodes of axilla and upper limb: Secondary | ICD-10-CM | POA: Diagnosis not present

## 2015-09-27 DIAGNOSIS — R938 Abnormal findings on diagnostic imaging of other specified body structures: Secondary | ICD-10-CM | POA: Diagnosis not present

## 2015-09-27 DIAGNOSIS — M898X9 Other specified disorders of bone, unspecified site: Secondary | ICD-10-CM

## 2015-09-27 DIAGNOSIS — B20 Human immunodeficiency virus [HIV] disease: Secondary | ICD-10-CM

## 2015-09-27 DIAGNOSIS — Z5189 Encounter for other specified aftercare: Secondary | ICD-10-CM

## 2015-09-27 DIAGNOSIS — M899 Disorder of bone, unspecified: Secondary | ICD-10-CM | POA: Diagnosis not present

## 2015-09-27 DIAGNOSIS — Z5111 Encounter for antineoplastic chemotherapy: Secondary | ICD-10-CM

## 2015-09-27 LAB — COMPREHENSIVE METABOLIC PANEL
ALK PHOS: 35 U/L — AB (ref 38–126)
ALT: 23 U/L (ref 14–54)
AST: 21 U/L (ref 15–41)
Albumin: 3.6 g/dL (ref 3.5–5.0)
Anion gap: 9 (ref 5–15)
BUN: 9 mg/dL (ref 6–20)
CHLORIDE: 103 mmol/L (ref 101–111)
CO2: 25 mmol/L (ref 22–32)
CREATININE: 0.59 mg/dL (ref 0.44–1.00)
Calcium: 9 mg/dL (ref 8.9–10.3)
GFR calc non Af Amer: >60 mL/min (ref >60)
GLUCOSE: 161 mg/dL — AB (ref 65–99)
Potassium: 3.4 mmol/L — ABNORMAL LOW (ref 3.5–5.1)
SODIUM: 137 mmol/L (ref 135–145)
Total Bilirubin: 0.6 mg/dL (ref 0.3–1.2)
Total Protein: 7.1 g/dL (ref 6.5–8.1)

## 2015-09-27 LAB — CBC WITH DIFFERENTIAL/PLATELET
BASOS PCT: 1 %
Basophils Absolute: 0 10*3/uL (ref 0.0–0.1)
EOS ABS: 0.1 10*3/uL (ref 0.0–0.7)
EOS PCT: 3 %
HCT: 40.2 % (ref 36.0–46.0)
HEMOGLOBIN: 12.9 g/dL (ref 12.0–15.0)
LYMPHS ABS: 2.2 10*3/uL (ref 0.7–4.0)
Lymphocytes Relative: 59 %
MCH: 28.7 pg (ref 26.0–34.0)
MCHC: 32.1 g/dL (ref 30.0–36.0)
MCV: 89.5 fL (ref 78.0–100.0)
MONO ABS: 1 10*3/uL (ref 0.1–1.0)
MONOS PCT: 27 %
Neutro Abs: 0.4 10*3/uL — ABNORMAL LOW (ref 1.7–7.7)
Neutrophils Relative %: 10 %
PLATELETS: 190 10*3/uL (ref 150–400)
RBC: 4.49 MIL/uL (ref 3.87–5.11)
RDW: 13.5 % (ref 11.5–15.5)
WBC: 3.8 10*3/uL — ABNORMAL LOW (ref 4.0–10.5)

## 2015-09-27 MED ORDER — SODIUM CHLORIDE 0.9% FLUSH
10.0000 mL | INTRAVENOUS | Status: DC | PRN
  Administered 2015-09-27: 10 mL
  Filled 2015-09-27: qty 10

## 2015-09-27 MED ORDER — BLEOMYCIN SULFATE CHEMO INJECTION 30 UNIT
10.0000 [IU]/m2 | Freq: Once | INTRAMUSCULAR | Status: AC
Start: 2015-09-27 — End: 2015-09-27
  Administered 2015-09-27: 24 [IU] via INTRAVENOUS
  Filled 2015-09-27: qty 8

## 2015-09-27 MED ORDER — SODIUM CHLORIDE 0.9 % IV SOLN
375.0000 mg/m2 | Freq: Once | INTRAVENOUS | Status: AC
  Administered 2015-09-27: 900 mg via INTRAVENOUS
  Filled 2015-09-27: qty 45

## 2015-09-27 MED ORDER — IOPAMIDOL (ISOVUE-370) INJECTION 76%
100.0000 mL | Freq: Once | INTRAVENOUS | Status: AC | PRN
  Administered 2015-09-27: 100 mL via INTRAVENOUS

## 2015-09-27 MED ORDER — HEPARIN SOD (PORK) LOCK FLUSH 100 UNIT/ML IV SOLN
500.0000 [IU] | Freq: Once | INTRAVENOUS | Status: AC | PRN
  Administered 2015-09-27: 500 [IU]
  Filled 2015-09-27: qty 5

## 2015-09-27 MED ORDER — PALONOSETRON HCL INJECTION 0.25 MG/5ML
0.2500 mg | Freq: Once | INTRAVENOUS | Status: AC
  Administered 2015-09-27: 0.25 mg via INTRAVENOUS

## 2015-09-27 MED ORDER — OXYCODONE-ACETAMINOPHEN 5-325 MG PO TABS: 1.0000 | ORAL_TABLET | ORAL | 0 refills | Status: DC | PRN

## 2015-09-27 MED ORDER — SODIUM CHLORIDE 0.9 % IV SOLN
Freq: Once | INTRAVENOUS | Status: AC
  Administered 2015-09-27: 13:00:00 via INTRAVENOUS

## 2015-09-27 MED ORDER — VINBLASTINE SULFATE CHEMO INJECTION 1 MG/ML
6.0000 mg/m2 | Freq: Once | INTRAVENOUS | Status: AC
  Administered 2015-09-27: 14.5 mg via INTRAVENOUS
  Filled 2015-09-27: qty 14.5

## 2015-09-27 MED ORDER — PALONOSETRON HCL INJECTION 0.25 MG/5ML
INTRAVENOUS | Status: AC
  Filled 2015-09-27: qty 5

## 2015-09-27 MED ORDER — DOXORUBICIN HCL CHEMO IV INJECTION 2 MG/ML
25.0000 mg/m2 | Freq: Once | INTRAVENOUS | Status: AC
  Administered 2015-09-27: 60 mg via INTRAVENOUS
  Filled 2015-09-27: qty 30

## 2015-09-27 MED ORDER — PEGFILGRASTIM 6 MG/0.6ML ~~LOC~~ PSKT
PREFILLED_SYRINGE | SUBCUTANEOUS | Status: AC
  Filled 2015-09-27: qty 0.6

## 2015-09-27 MED ORDER — PEGFILGRASTIM 6 MG/0.6ML ~~LOC~~ PSKT
6.0000 mg | PREFILLED_SYRINGE | Freq: Once | SUBCUTANEOUS | Status: AC
  Administered 2015-09-27: 6 mg via SUBCUTANEOUS

## 2015-09-27 MED ORDER — LORATADINE 10 MG PO TABS: 10.0000 mg | ORAL_TABLET | Freq: Every day | ORAL | 3 refills | Status: DC

## 2015-09-27 MED ORDER — SODIUM CHLORIDE 0.9 % IV SOLN
Freq: Once | INTRAVENOUS | Status: AC
  Administered 2015-09-27: 14:00:00 via INTRAVENOUS
  Filled 2015-09-27: qty 5

## 2015-09-27 NOTE — Progress Notes (Signed)
VSS.  Alert, in no distress.  Up ambulatory in room to bathroom w/o difficulty/dyspnea.  Discharged in c/o family.  Instructed to report to the emergency room with any shortness of breath or chest pain.

## 2015-09-27 NOTE — Assessment & Plan Note (Addendum)
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  Started ABVD on 09/13/2015 with curative intent.  Oncology history updated.  Pre-treatment labs: CBC diff, CMET, CD4 count.  I personally reviewed and went over laboratory results with the patient.  The results are noted within this dictation.  Neutropenia is noted with ANC 0.4.  Will treat through neutropenia and Neulasta OnPRO is added for treatment today.  Confirmed this plan of action with both Dr. Irene Limbo and Dr. Marin Olp in Dr. Donald Pore absence.  Hypokalemia is noted and addressed during treatment visit.  Neulasta OnPro is added to treatment plan today.  Risks, benefits, alternatives, and side effects of Neulasta discussed.  She si educated on bone pain from Neulasta.  Rx is provided for Claritin to be taken day prior to treatment and to be continued for approximately 1 week post treatment.  Additionally, Rx printed for Percocet for Neulasta-induced bone pain if needed.  Beginning yesterday, she noted a LUQ abdominal discomfort that would come and go.  She notes that it would "grab my breath."  She reports that it is improved today.  Toward's the end of treatment, nursing reported decrease in O2 saturation.  Other vital remained stable.  She was started on 2 L [place] with quick and appropriate response and improvement in O2.  Order placed for CT Chest angio STAT.    I personally reviewed and went over radiographic studies with the patient.  The results are noted within this dictation.  No evidence for PE on CT angio of chest.  Additionally, improvement in lymphadenopathy is noted.  No radiographic findings concerning for Bleomycin-induced toxicity.  Imaging was unrevealing regarding her LUQ abdominal pain,  other than possible small pleural effusion.  Pre-treatment labs in 2 weeks: CBC diff, CMET.  She will receive 2 cycles of ABVD followed by re-imaging with PET.  Depending on PET results, future medical oncology recommendations will follow.  Return in 2 weeks for follow-up and to embark on cycle #2.

## 2015-09-27 NOTE — Patient Instructions (Signed)
[place] at [facility] Discharge Instructions  RECOMMENDATIONS MADE BY THE CONSULTANT AND ANY TEST RESULTS WILL BE SENT TO YOUR REFERRING PHYSICIAN.  You saw [name], PA-C today. Start taking Claritin the day before treatment and then for 1 week after treatment, then stop. Do this every time you have treatment. It will help with the Neulasta induced bone pain. You were given prescriptions for claritin and oxycodone. Return in 2 weeks for treatment and follow up with doctor.  Thank you for choosing [place] at [facility] to provide your oncology and hematology care.  To afford each patient quality time with our provider, please arrive at least 15 minutes before your scheduled appointment time.   Beginning January 23rd 2017 lab work for the [facility] will be done in the  Main lab at [facility] on 1st floor. If you have a lab appointment with the [facility] please come in thru the  Main Entrance and check in at the main information desk  You need to re-schedule your appointment should you arrive 10 or more minutes late.  We strive to give you quality time with our providers, and arriving late affects you and other patients whose appointments are after yours.  Also, if you no show three or more times for appointments you may be dismissed from the clinic at the providers discretion.     Again, thank you for choosing [facility].  Our hope is that these requests will decrease the amount of time that you wait before being seen by our physicians.       _____________________________________________________________  Should you have questions after your visit to [facility], please contact our office at [phone] between the hours of 8:30 a.m. and 4:30 p.m.  Voicemails left after 4:30 p.m. will not be returned until the following business day.  For prescription refill requests, have your pharmacy contact our  office.         Resources For Cancer Patients and their Caregivers ? American Cancer Society: Can assist with transportation, wigs, general needs, runs Look Good Feel Better.        [phone] ? Cancer Care: Provides financial assistance, online support groups, medication/co-pay assistance.  1-800-813-HOPE 859-617-2257) ? [place] Assists [place] cancer patients and their families through emotional , educational and financial support.  304 494 0335 ? [place] DSS Where to apply for food stamps, Medicaid and utility assistance. 760-583-5796 ? RCATS: Transportation to medical appointments. 724-871-1700 ? Social Security Administration: May apply for disability if have a Stage IV cancer. 854-465-6803 619-192-8700 ? [employer], Disability and Transit Services: Assists with nutrition, care and transit needs. [place] Support Programs: @10RELATIVEDAYS @ > Cancer Support Group  2nd Tuesday of the month 1pm-2pm, Journey Room  > Creative Journey  3rd Tuesday of the month 1130am-1pm, Journey Room  > Look Good Feel Better  1st Wednesday of the month 10am-12 noon, Journey Room (Call [place] to register [phone])

## 2015-09-27 NOTE — Progress Notes (Signed)
1545 02 SAT 84% . Pt does complain of some mild SOB while laying in bed but nothing significant. Color pale HR 101 Pt did experience some left side chest pain last night but denies any at this time. [name] PA in to see pt and ordered a stat chest CT ANGIO. Pt instructed in purpose and side effects of Neulasta on-pro which was applied to right arm Pt transported via wheelchair to radiology with 02 on at 2l

## 2015-09-27 NOTE — Progress Notes (Signed)
      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  INTERVAL HISTORY: Holly Stephens 60 y.o. female returns for followup of Hodgkin's Lymphoma, nodular sclerosing-type, classical Hodgkin's Disease with lymphatic involvement in multiple lymph nodes superior and inferior to the diaphragm with PET imaging demonstrating findings concerning for pulmonary involvement (Stage III versus Stage IV disease ?pulmonary nodules x 2?).    [name], nodular sclerosis (HCC)   07/23/2015 Procedure    Lext axilla lymph node biopsy     07/27/2015 Pathology Results    Classical [name], nodular sclerosis type.  Positive CD30, CD15, andPAX-5.  Negative CD45 and CD20.     08/31/2015 PET scan    Hypermetabolic lymphadenopathy. Nodal stations include: L supraclavicular nodes, L axillary nodes, mediastinal lymph nodes, periaortic retroperitoneal and periportal lymph nodes as well as L iliac and inguinal lymph nodes. Two concerning pulm nodules...     09/01/2015 Imaging    MUGA- Normal LEFT ventricular ejection fraction of 73%.  Normal LEFT ventricular wall motion.     09/01/2015 Procedure    PFTs-  Spirometry shows mild ventilatory defect without airflow obstruction and no bronchodilator improvement. Lung volumes are normal. Airway resistance is normal. DLCO normal. Essentially normal lung function     09/08/2015 Procedure    Port placed by Dr. Davis     09/13/2015 -  Chemotherapy    ABVD     09/27/2015 Imaging   CT angio of chest- no gross PE seen. Improving axillary, mediastinal and upper abdominal adenopathy. Improved and possibly resolved RUL nodule and with resolved or obscured LLL nodule. Small L pleural effusion and mild L lower lobe atelectasis.       She reports, beginning yesterday, a LUQ pain that would "catch" her breath.  She notes that this would cause SOB.  It resolved spontaneously and would come and go.  She took Aleve and that helped with the pain.  She notes that it is improved today.  She is provided education regarding neutropenia and neutropenic precautions.  Review of Systems  Constitutional: Negative for chills, fever and weight loss.  HENT: Negative.   Eyes: Negative.   Respiratory: Positive for shortness of breath (Intermittent, sudden onset, resolves spontaneously). Negative for cough and sputum production.   Cardiovascular: Negative for chest pain and palpitations.  Gastrointestinal: Positive for abdominal pain (LUQ, intermittent).  Genitourinary: Negative.   Musculoskeletal: Negative.   Skin: Negative.   Neurological: Negative.  Negative for weakness.  Endo/Heme/Allergies: Negative.   Psychiatric/Behavioral: Negative.     Past Medical History:  Diagnosis Date  . Arthritis    osteoarthritis  . Diabetes mellitus without complication (HCC)   . HIV infection (HCC)   . 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 Human immunodeficiency virus (HIV) disease (HCC) 02/23/2006   Qualifier: Diagnosis of  By: Campbell MD, John      Past Surgical History:  Procedure Laterality Date  . AXILLARY LYMPH NODE BIOPSY Left 07/23/2015   Procedure: EXCISIONAL BIOPSY LEFT AXILLARY LYMPH NODE;  Surgeon: Jason   Evan Davis, MD;  Location: [facility];  Service: General;  Laterality: Left;  . PORTACATH PLACEMENT Right 09/08/2015   Procedure: INSERTION OF CENTRAL VENOUS CATHETER WITH PORT FOR CHEMOTHERAPY;  Surgeon: Jason Evan Davis, MD;  Location: [facility];  Service: General;   Laterality: Right;  . SHOULDER SURGERY Right    removal of bone spur    No family history on file.  Social History   Social History  . Marital status: Widowed    Spouse name: N/A  . Number of children: N/A  . Years of education: N/A   Social History Main Topics  . Smoking status: Never Smoker  . Smokeless tobacco: Never Used  . Alcohol use No  . Drug use: No  . Sexual activity: No     Comment: declined condoms   Other Topics Concern  . Not on file   Social History Narrative  . No narrative on file     PHYSICAL EXAMINATION  ECOG PERFORMANCE STATUS: 1 - Symptomatic but completely ambulatory  There were no vitals filed for this visit.  BP 116/54 P 95 T 97.9 R 20  GENERAL:alert, no distress, well nourished, well developed, comfortable, cooperative, obese, smiling and accompanied in chemo bed, "hot flashes" noted SKIN: skin color, texture, turgor are normal, no rashes or significant lesions HEAD: Normocephalic, No masses, lesions, tenderness or abnormalities EYES: normal, EOMI, Conjunctiva are pink and non-injected EARS: External ears normal OROPHARYNX:lips, buccal mucosa, and tongue normal and mucous membranes are moist  NECK: supple, trachea midline LYMPH:  not examined BREAST:not examined LUNGS: clear to auscultation  HEART: regular rate & rhythm ABDOMEN: truncal obesity, no tenderness to palpation of LUQ. BACK: Back symmetric, no curvature. EXTREMITIES:less then 2 second capillary refill, no joint deformities, effusion, or inflammation, no skin discoloration, no clubbing, no cyanosis NEURO: alert & oriented x 3 with fluent speech, no focal motor/sensory deficits, gait normal    LABORATORY DATA: CBC    Component Value Date/Time   WBC 3.8 (L) 09/27/2015 1135   RBC 4.49 09/27/2015 1135   HGB 12.9 09/27/2015 1135   HCT 40.2 09/27/2015 1135   PLT 190 09/27/2015 1135   MCV 89.5 09/27/2015 1135   MCH 28.7 09/27/2015 1135   MCHC 32.1 09/27/2015 1135   RDW  13.5 09/27/2015 1135   LYMPHSABS 2.2 09/27/2015 1135   MONOABS 1.0 09/27/2015 1135   EOSABS 0.1 09/27/2015 1135   BASOSABS 0.0 09/27/2015 1135      Chemistry      Component Value Date/Time   NA 137 09/27/2015 1135   K 3.4 (L) 09/27/2015 1135   CL 103 09/27/2015 1135   CO2 25 09/27/2015 1135   BUN 9 09/27/2015 1135   CREATININE 0.59 09/27/2015 1135   CREATININE 0.52 06/01/2015 0900      Component Value Date/Time   CALCIUM 9.0 09/27/2015 1135   ALKPHOS 35 (L) 09/27/2015 1135   AST 21 09/27/2015 1135   ALT 23 09/27/2015 1135   BILITOT 0.6 09/27/2015 1135        PENDING LABS:   RADIOGRAPHIC STUDIES:  Ct Angio Chest Pe W Or Wo Contrast  Result Date: 09/27/2015 CLINICAL DATA:  Sudden onset shortness of breath. Hodgkin's lymphoma. 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 EXAM: CT ANGIOGRAPHY CHEST WITH CONTRAST TECHNIQUE: Multidetector CT imaging of the chest was performed using the standard protocol during bolus administration of intravenous contrast. Multiplanar CT image reconstructions and MIPs were obtained to evaluate the vascular anatomy. CONTRAST:  100 cc Isovue 370 COMPARISON:    Portable chest dated 09/08/2015. PET-CT dated 08/31/2015. FINDINGS: Mediastinum/Lymph Nodes: No enlarged mediastinal or hilar lymph nodes. Multiple enlarged left axillary lymph nodes are again demonstrated. The previous 2.8 cm short axis left axillary node currently has a short axis diameter 2.5 cm on image number 36 of series 4. There are also mildly enlarged right axillary lymph nodes. A previous right axillary node with a short axis diameter of 11 mm currently has a short axis diameter 8 mm on image number 48 of series 4. The previously demonstrated 12 mm short axis left internal mammary lymph node has a short axis diameter of 10 mm today on image number 58 of series 4. Evaluation of the pulmonary arteries is limited by motion artifacts and suboptimal bolus opacification of the pulmonary arteries  compared to veins. No gross pulmonary arterial filling defects are seen. There are some pulmonary vein filling defects, intermixed non-opacified blood or motion artifacts, most pronounced in the left upper lobe. Lungs/Pleura: Small left pleural effusion. Mild left lower lobe atelectasis. The previously demonstrated 8 mm right lower lobe nodule is not currently visualized. The previously demonstrated 8 mm right upper lobe nodule is significantly smaller, measuring approximately 4 mm in maximum diameter on image number 72 of series 6. This is difficult to the distinguish from normal vessel. The previously demonstrated 7 mm left lower lobe nodule is not seen today, possibly obscured by atelectasis. Upper abdomen: A small low density left adrenal mass is not changed significantly, measuring 1.5 x 1.3 cm on image number 128 of series 4. Previously demonstrated enlarged upper abdominal lymph nodes are significantly smaller. Currently, a left gastrohepatic ligament node has a short axis diameter of 19 mm on image number 154 and previously had a short axis diameter of 26 mm. Musculoskeletal: Extensive thoracic spine degenerative changes. Review of the MIP images confirms the above findings. IMPRESSION: 1. Limited evaluation of the pulmonary arteries due to extensive motion artifacts and suboptimal bolus timing with no gross pulmonary emboli seen. There are some pulmonary vein filling defects, intermixed non-opacified blood or motion artifacts. 2. Improving axillary, mediastinal and upper abdominal adenopathy. 3. Improved and possibly resolved right upper lobe nodule and with resolved or obscured left lower lobe nodule. 4. Stable left adrenal probable adenoma. 5. Small left pleural effusion and mild left lower lobe atelectasis. Electronically Signed   By: Claudie Revering M.D.   On: 09/27/2015 18:01   Nm Cardiac Muga Rest  Result Date: 09/01/2015 CLINICAL DATA:  Nodular sclerosing Hodgkin's lymphoma of axillary lymph nodes,  planned Adriamycin chemotherapy EXAM: NUCLEAR MEDICINE CARDIAC BLOOD POOL IMAGING (MUGA) TECHNIQUE: Cardiac multi-gated acquisition was performed at rest following intravenous injection of Tc-50mlabeled red blood cells. RADIOPHARMACEUTICALS:  25 mCi Tc-913mn-vitro labeled red blood cells IV COMPARISON:  None FINDINGS: Calculated LEFT ventricular ejection fraction is 73%, within the normal range. Normal left ventricular wall motion on cine analysis of the gated blood pool in 3 projections. IMPRESSION: Normal LEFT ventricular ejection fraction of 73%. Normal LEFT ventricular wall motion. Electronically Signed   By: MaLavonia Dana.D.   On: 09/01/2015 14:53   Nm Pet Image Initial (pi) Skull Base To Thigh  Result Date: 08/31/2015 CLINICAL DATA:  Initial treatment strategy for Hodgkin's lymphoma. Additional history of HIV infection. EXAM: NUCLEAR MEDICINE PET SKULL BASE TO THIGH TECHNIQUE: 14.6 mCi F-18 FDG was injected intravenously. Full-ring PET imaging was performed from the skull base to thigh after the radiotracer. CT data was obtained and used for attenuation correction and anatomic  localization. FASTING BLOOD GLUCOSE:  Value: 145 mg/dl COMPARISON:  None. FINDINGS: NECK Hypermetabolic LEFT supraclavicular lymph nodes with SUV max equal 8.2. CHEST Bulky hypermetabolic LEFT axillary lymph nodes measure up to 2.8 cm short axis with SUV max equal 12.2. Smaller hypermetabolic RIGHT axillary nodes. Hypermetabolic mediastinal lymph nodes and RIGHT hilar lymph nodes. internal mammary lymph node on the LEFT Measures 12 mm (image 57 series 4) with SUV max equal 10.0. Subcarinal lymph node SUV max equal 11.8. LEFT lower lobe nodule measures 7 mm (image 32, series 6) without clear metabolic activity. RIGHT upper lobe nodule measures 8 mm (image 25 series 6) also without clear metabolic activity. ABDOMEN/PELVIS Bulky periportal and periaortic retroperitoneal hypermetabolic lymph nodes. Periportal lymph node with SUV max  equals 12.2 and LEFT periaortic lymph node with SUV max equal 9.5. Adenopathy extends into the LEFT external iliac and inguinal lymph nodes. Hypermetabolic LEFT inguinal lymph node measures 2.0 cm short axis with SUV max equal 18.5. There is a large well-circumscribed fat lesion within the subcutaneous tissue in the deep to the umbilicus. This connects with the peritoneal space and most consistent 10 cm umbilical hernia. The hernia contains only fat. Mild peripheral stranding. SKELETON Scattered moderately hypermetabolic marrow activity without focal lesion. IMPRESSION: 1. Hypermetabolic lymphadenopathy consistent lymphoma. Nodal stations include the LEFT supraclavicular nodes, LEFT axillary nodes, mediastinal lymph nodes, periaortic retroperitoneal and periportal lymph nodes as well as LEFT iliac and inguinal lymph nodes. 2. Two pulmonary nodules while not hypermetabolic are concerning for lymphoma involvement of the lung parenchyma. 3. No convincing evidence of bone marrow involvement. Mild heterogeneous metabolic marrow activity. 4. Incidental note of large periumbilical hernia. Electronically Signed   By: Stewart  Edmunds M.D.   On: 08/31/2015 09:18   Dg Chest Port 1 View  Result Date: 09/08/2015 CLINICAL DATA:  Port-A-Cath insertion. Nodular sclerosing Hodgkin's lymphoma EXAM: DG C-ARM 1-60 MIN-NO REPORT; PORTABLE CHEST - 1 VIEW COMPARISON:  None. FLUOROSCOPY TIME:  0 minutes 33 seconds; no fluoroscopic image acquired. FINDINGS: Port-A-Cath tip is in the superior vena cava. No pneumothorax. There is cardiomegaly with pulmonary venous hypertension. There is interstitial edema. There is no appreciable airspace consolidation. There is questionable left hilar adenopathy. No bone lesions. IMPRESSION: Port-A-Cath tip in superior vena cava. No pneumothorax. Findings indicative of congestive heart failure. Question left hilar adenopathy. Electronically Signed   By: William  Woodruff III M.D.   On: 09/08/2015 09:42     Dg C-arm 1-60 Min-no Report  Result Date: 09/08/2015 CLINICAL DATA:  Port-A-Cath insertion. Nodular sclerosing Hodgkin's lymphoma EXAM: DG C-ARM 1-60 MIN-NO REPORT; PORTABLE CHEST - 1 VIEW COMPARISON:  None. FLUOROSCOPY TIME:  0 minutes 33 seconds; no fluoroscopic image acquired. FINDINGS: Port-A-Cath tip is in the superior vena cava. No pneumothorax. There is cardiomegaly with pulmonary venous hypertension. There is interstitial edema. There is no appreciable airspace consolidation. There is questionable left hilar adenopathy. No bone lesions. IMPRESSION: Port-A-Cath tip in superior vena cava. No pneumothorax. Findings indicative of congestive heart failure. Question left hilar adenopathy. Electronically Signed   By: William  Woodruff III M.D.   On: 09/08/2015 09:42    PATHOLOGY:    ASSESSMENT AND PLAN:  [name], nodular sclerosis (HCC) Hodgkin's Lymphoma, nodular sclerosing-type, classical Hodgkin's Disease with lymphatic involvement in multiple lymph nodes superior and inferior to the diaphragm with PET imaging demonstrating findings concerning for pulmonary involvement (Stage III versus Stage IV disease ?pulmonary nodules x 2?).  Started ABVD on 09/13/2015 with curative intent.  Oncology history updated.    Pre-treatment labs: CBC diff, CMET, CD4 count.  I personally reviewed and went over laboratory results with the patient.  The results are noted within this dictation.  Neutropenia is noted with ANC 0.4.  Will treat through neutropenia and Neulasta OnPRO is added for treatment today.  Confirmed this plan of action with both Dr. Kale and Dr. Ennever in Dr. Penland's absence.  Hypokalemia is noted and addressed during treatment visit.  Neulasta OnPro is added to treatment plan today.  Risks, benefits, alternatives, and side effects of Neulasta discussed.  She si educated on bone pain from Neulasta.  Rx is provided for Claritin to be taken day prior to treatment and to be continued for  approximately 1 week post treatment.  Additionally, Rx printed for Percocet for Neulasta-induced bone pain if needed.  Beginning yesterday, she noted a LUQ abdominal discomfort that would come and go.  She notes that it would "grab my breath."  She reports that it is improved today.  Toward's the end of treatment, nursing reported decrease in O2 saturation.  Other vital remained stable.  She was started on 2 L [place] with quick and appropriate response and improvement in O2.  Order placed for CT Chest angio STAT.    I personally reviewed and went over radiographic studies with the patient.  The results are noted within this dictation.  No evidence for PE on CT angio of chest.  Additionally, improvement in lymphadenopathy is noted.  No radiographic findings concerning for Bleomycin-induced toxicity.  Imaging was unrevealing regarding her LUQ abdominal pain, other than possible small pleural effusion.  Pre-treatment labs in 2 weeks: CBC diff, CMET.  She will receive 2 cycles of ABVD followed by re-imaging with PET.  Depending on PET results, future medical oncology recommendations will follow.  Return in 2 weeks for follow-up and to embark on cycle #2.   ORDERS PLACED FOR THIS ENCOUNTER: Orders Placed This Encounter  Procedures  . CT ANGIO CHEST PE W OR WO CONTRAST    MEDICATIONS PRESCRIBED THIS ENCOUNTER: Meds ordered this encounter  Medications  . loratadine (CLARITIN) 10 MG tablet    Sig: Take 1 tablet (10 mg total) by mouth daily.    Dispense:  30 tablet    Refill:  3    Order Specific Question:   Supervising Provider    Answer:   PENLAND, SHANNON K [[id number]]  . oxyCODONE-acetaminophen (ROXICET) 5-325 MG tablet    Sig: Take 1 tablet by mouth every 4 (four) hours as needed for severe pain.    Dispense:  30 tablet    Refill:  0    Order Specific Question:   Supervising Provider    Answer:   PENLAND, SHANNON K [[id number]]    THERAPY PLAN:  ABVD with curative intent 2 cycles followed  by PET imaging to evaluate response.  All questions were answered. The patient knows to call the clinic with any problems, questions or concerns. We can certainly see the patient much sooner if necessary.  Patient and plan discussed with Dr. Shannon Penland and she is in agreement with the aforementioned.   This note is electronically signed by: ,, PA-C 09/27/2015 7:18 PM  

## 2015-09-27 NOTE — Patient Instructions (Signed)
[facility] Discharge Instructions for Patients Receiving Chemotherapy   Beginning January 23rd 2017 lab work for the [employer] [employer] will be done in the  Main lab at [facility] on 1st floor. If you have a lab appointment with the [facility] please come in thru the  Main Entrance and check in at the main information desk   Today you received the following chemotherapy agents Adriamycin, Bleomycin, Vinblastine and DTIC. Neulasta OnPro device placed on upper arm. Device will dispense medication between 7pm and 8pm tomorrow (Tuesday). You may take device off after 8pm tomorrow. To help prevent nausea and vomiting after your treatment, we encourage you to take your nausea medication as instructed. If you develop nausea and vomiting, or diarrhea that is not controlled by your medication, call the clinic.  The clinic phone number is [phone]. Office hours are Monday-Friday 8:30am-5:00pm.  BELOW ARE SYMPTOMS THAT SHOULD BE REPORTED IMMEDIATELY:  *FEVER GREATER THAN 101.0 F  *CHILLS WITH OR WITHOUT FEVER  NAUSEA AND VOMITING THAT IS NOT CONTROLLED WITH YOUR NAUSEA MEDICATION  *UNUSUAL SHORTNESS OF BREATH  *UNUSUAL BRUISING OR BLEEDING  TENDERNESS IN MOUTH AND THROAT WITH OR WITHOUT PRESENCE OF ULCERS  *URINARY PROBLEMS  *BOWEL PROBLEMS  UNUSUAL RASH Items with * indicate a potential emergency and should be followed up as soon as possible. If you have an emergency after office hours please contact your primary care physician or go to the nearest emergency department.  Please call the clinic during office hours if you have any questions or concerns.   You may also contact the Patient Navigator at [phone] should you have any questions or need assistance in obtaining follow up care.      Resources For Cancer Patients and their Caregivers ? American Cancer Society: Can assist with transportation, wigs, general needs, runs Look Good Feel Better.         315 423 5614 ? Cancer Care: Provides financial assistance, online support groups, medication/co-pay assistance.  1-800-813-HOPE 910-566-5315) ? [place] Assists [place] cancer patients and their families through emotional , educational and financial support.  702-849-9692 ? [place] DSS Where to apply for food stamps, Medicaid and utility assistance. 858-433-3915 ? [employer]ATS: Transportation to medical appointments. (484)443-5070 ? Social Security Administration: May apply for disability if have a Stage IV cancer. 707-692-1905 6197203748 ? [employer], Disability and Transit Services: Assists with nutrition, care and transit needs. 859-397-1384

## 2015-09-28 LAB — T-HELPER CELLS (CD4) COUNT (NOT AT ARMC)
CD4 T CELL HELPER: 41 % (ref 33–55)
CD4 T Cell Abs: 810 /uL (ref 400–2700)

## 2015-09-29 ENCOUNTER — Encounter (HOSPITAL_COMMUNITY): Payer: Self-pay

## 2015-10-01 ENCOUNTER — Encounter (HOSPITAL_BASED_OUTPATIENT_CLINIC_OR_DEPARTMENT_OTHER): Payer: 59

## 2015-10-01 DIAGNOSIS — C8114 Nodular sclerosis classical Hodgkin lymphoma, lymph nodes of axilla and upper limb: Secondary | ICD-10-CM

## 2015-10-01 MED ORDER — HEPARIN SOD (PORK) LOCK FLUSH 100 UNIT/ML IV SOLN
INTRAVENOUS | Status: AC
  Filled 2015-10-01: qty 5

## 2015-10-01 MED ORDER — SODIUM CHLORIDE 0.9 % IV SOLN
INTRAVENOUS | Status: DC
  Administered 2015-10-01: 12:00:00 via INTRAVENOUS

## 2015-10-01 MED ORDER — HEPARIN SOD (PORK) LOCK FLUSH 100 UNIT/ML IV SOLN
500.0000 [IU] | Freq: Once | INTRAVENOUS | Status: AC
  Administered 2015-10-01: 500 [IU] via INTRAVENOUS

## 2015-10-01 MED ORDER — SODIUM CHLORIDE 0.9% FLUSH
10.0000 mL | INTRAVENOUS | Status: DC | PRN
  Administered 2015-10-01: 10 mL via INTRAVENOUS
  Filled 2015-10-01: qty 10

## 2015-10-01 NOTE — Patient Instructions (Signed)
[place] at [facility] Discharge Instructions  RECOMMENDATIONS MADE BY THE CONSULTANT AND ANY TEST RESULTS WILL BE SENT TO YOUR REFERRING PHYSICIAN.  1 liter of IVF given today. Follow up as scheduled. Call for any problems or concerns  Thank you for choosing [place] at [facility] to provide your oncology and hematology care.  To afford each patient quality time with our provider, please arrive at least 15 minutes before your scheduled appointment time.   Beginning January 23rd 2017 lab work for the [facility] will be done in the  Main lab at [facility] on 1st floor. If you have a lab appointment with the [facility] please come in thru the  Main Entrance and check in at the main information desk  You need to re-schedule your appointment should you arrive 10 or more minutes late.  We strive to give you quality time with our providers, and arriving late affects you and other patients whose appointments are after yours.  Also, if you no show three or more times for appointments you may be dismissed from the clinic at the providers discretion.     Again, thank you for choosing [facility].  Our hope is that these requests will decrease the amount of time that you wait before being seen by our physicians.       _____________________________________________________________  Should you have questions after your visit to [facility], please contact our office at [phone] between the hours of 8:30 a.m. and 4:30 p.m.  Voicemails left after 4:30 p.m. will not be returned until the following business day.  For prescription refill requests, have your pharmacy contact our office.         Resources For Cancer Patients and their Caregivers ? American Cancer Society: Can assist with transportation, wigs, general needs, runs Look Good Feel Better.        (801)281-5089 ? Cancer Care: Provides financial  assistance, online support groups, medication/co-pay assistance.  1-800-813-HOPE (254)135-3630) ? [place] Assists [place] cancer patients and their families through emotional , educational and financial support.  626-274-3907 ? [place] DSS Where to apply for food stamps, Medicaid and utility assistance. 847 328 9880 ? RCATS: Transportation to medical appointments. 614-781-0287 ? Social Security Administration: May apply for disability if have a Stage IV cancer. (971)123-1531 669-340-2047 ? [employer], Disability and Transit Services: Assists with nutrition, care and transit needs. [place] Support Programs: @10RELATIVEDAYS @ > Cancer Support Group  2nd Tuesday of the month 1pm-2pm, Journey Room  > Creative Journey  3rd Tuesday of the month 1130am-1pm, Journey Room  > Look Good Feel Better  1st Wednesday of the month 10am-12 noon, Journey Room (Call [place] to register [phone])

## 2015-10-01 NOTE — Progress Notes (Signed)
Patient here for 1 liter of IVF today. Given per orders. Patient tolerated well.  Patient left ambulatory from clinic

## 2015-10-01 NOTE — Addendum Note (Signed)
Addended by: Donnie Aho on: 10/01/2015 02:02 PM   Modules accepted: Orders, SmartSet

## 2015-10-03 ENCOUNTER — Encounter (HOSPITAL_COMMUNITY): Payer: Self-pay | Admitting: Oncology

## 2015-10-04 ENCOUNTER — Telehealth (HOSPITAL_COMMUNITY): Payer: Self-pay | Admitting: *Deleted

## 2015-10-05 ENCOUNTER — Other Ambulatory Visit (HOSPITAL_COMMUNITY): Payer: Self-pay

## 2015-10-05 DIAGNOSIS — E785 Hyperlipidemia, unspecified: Secondary | ICD-10-CM

## 2015-10-05 DIAGNOSIS — M25473 Effusion, unspecified ankle: Secondary | ICD-10-CM

## 2015-10-05 MED ORDER — SPIRONOLACTONE 50 MG PO TABS: 50.0000 mg | ORAL_TABLET | Freq: Every day | ORAL | 0 refills | Status: DC

## 2015-10-05 MED ORDER — PANTOPRAZOLE SODIUM 40 MG PO TBEC: 40.0000 mg | DELAYED_RELEASE_TABLET | Freq: Every day | ORAL | 2 refills | Status: DC

## 2015-10-11 ENCOUNTER — Encounter (HOSPITAL_COMMUNITY): Payer: Self-pay | Admitting: Oncology

## 2015-10-11 ENCOUNTER — Encounter (HOSPITAL_COMMUNITY): Payer: Self-pay

## 2015-10-11 ENCOUNTER — Encounter (HOSPITAL_BASED_OUTPATIENT_CLINIC_OR_DEPARTMENT_OTHER): Payer: 59 | Admitting: Oncology

## 2015-10-11 ENCOUNTER — Encounter (HOSPITAL_BASED_OUTPATIENT_CLINIC_OR_DEPARTMENT_OTHER): Payer: 59

## 2015-10-11 VITALS — BP 111/57 | HR 79 | Temp 98.4°F | Resp 20 | Wt 270.0 lb

## 2015-10-11 VITALS — BP 108/54 | HR 88 | Temp 98.0°F | Resp 20 | Wt 270.1 lb

## 2015-10-11 DIAGNOSIS — C8114 Nodular sclerosis classical Hodgkin lymphoma, lymph nodes of axilla and upper limb: Secondary | ICD-10-CM

## 2015-10-11 DIAGNOSIS — C8118 Nodular sclerosis classical Hodgkin lymphoma, lymph nodes of multiple sites: Secondary | ICD-10-CM | POA: Diagnosis not present

## 2015-10-11 DIAGNOSIS — Z5111 Encounter for antineoplastic chemotherapy: Secondary | ICD-10-CM | POA: Diagnosis not present

## 2015-10-11 DIAGNOSIS — E876 Hypokalemia: Secondary | ICD-10-CM

## 2015-10-11 LAB — COMPREHENSIVE METABOLIC PANEL
ALBUMIN: 3.5 g/dL (ref 3.5–5.0)
ALK PHOS: 48 U/L (ref 38–126)
ALT: 24 U/L (ref 14–54)
ANION GAP: 8 (ref 5–15)
AST: 28 U/L (ref 15–41)
BILIRUBIN TOTAL: 0.6 mg/dL (ref 0.3–1.2)
BUN: 12 mg/dL (ref 6–20)
CALCIUM: 9.3 mg/dL (ref 8.9–10.3)
CO2: 29 mmol/L (ref 22–32)
Chloride: 101 mmol/L (ref 101–111)
Creatinine, Ser: 0.66 mg/dL (ref 0.44–1.00)
GFR calc Af Amer: >60 mL/min (ref >60)
GLUCOSE: 123 mg/dL — AB (ref 65–99)
POTASSIUM: 2.8 mmol/L — AB (ref 3.5–5.1)
Sodium: 138 mmol/L (ref 135–145)
TOTAL PROTEIN: 6.5 g/dL (ref 6.5–8.1)

## 2015-10-11 LAB — CBC WITH DIFFERENTIAL/PLATELET
BASOS PCT: 2 %
Basophils Absolute: 0.3 10*3/uL — ABNORMAL HIGH (ref 0.0–0.1)
EOS ABS: 0 10*3/uL (ref 0.0–0.7)
Eosinophils Relative: 0 %
HEMATOCRIT: 39.5 % (ref 36.0–46.0)
HEMOGLOBIN: 12.7 g/dL (ref 12.0–15.0)
LYMPHS ABS: 3 10*3/uL (ref 0.7–4.0)
LYMPHS PCT: 21 %
MCH: 28.5 pg (ref 26.0–34.0)
MCHC: 32.2 g/dL (ref 30.0–36.0)
MCV: 88.8 fL (ref 78.0–100.0)
Monocytes Absolute: 0.9 10*3/uL (ref 0.1–1.0)
Monocytes Relative: 6 %
NEUTROS ABS: 10 10*3/uL — AB (ref 1.7–7.7)
Neutrophils Relative %: 71 %
Platelets: 188 10*3/uL (ref 150–400)
RBC: 4.45 MIL/uL (ref 3.87–5.11)
RDW: 14.4 % (ref 11.5–15.5)
WBC: 14.2 10*3/uL — ABNORMAL HIGH (ref 4.0–10.5)

## 2015-10-11 MED ORDER — SODIUM CHLORIDE 0.9 % IV SOLN: 375.0000 mg/m2 | Freq: Once | INTRAVENOUS | Status: DC

## 2015-10-11 MED ORDER — POTASSIUM CHLORIDE CRYS ER 20 MEQ PO TBCR: 20.0000 meq | EXTENDED_RELEASE_TABLET | Freq: Two times a day (BID) | ORAL | 0 refills | Status: DC

## 2015-10-11 MED ORDER — DOXORUBICIN HCL CHEMO IV INJECTION 2 MG/ML
25.0000 mg/m2 | Freq: Once | INTRAVENOUS | Status: AC
  Administered 2015-10-11: 58 mg via INTRAVENOUS
  Filled 2015-10-11: qty 29

## 2015-10-11 MED ORDER — SODIUM CHLORIDE 0.9 % IV SOLN
Freq: Once | INTRAVENOUS | Status: AC
  Administered 2015-10-11: 12:00:00 via INTRAVENOUS
  Filled 2015-10-11: qty 5

## 2015-10-11 MED ORDER — DOXORUBICIN HCL CHEMO IV INJECTION 2 MG/ML: 25.0000 mg/m2 | Freq: Once | INTRAVENOUS | Status: DC

## 2015-10-11 MED ORDER — SODIUM CHLORIDE 0.9 % IV SOLN
10.0000 [IU]/m2 | Freq: Once | INTRAVENOUS | Status: AC
  Administered 2015-10-11: 23 [IU] via INTRAVENOUS
  Filled 2015-10-11: qty 7.67

## 2015-10-11 MED ORDER — VINBLASTINE SULFATE CHEMO INJECTION 1 MG/ML: 6.0000 mg/m2 | Freq: Once | INTRAVENOUS | Status: DC

## 2015-10-11 MED ORDER — SODIUM CHLORIDE 0.9% FLUSH
10.0000 mL | INTRAVENOUS | Status: DC | PRN
Start: 2015-10-11 — End: 2015-10-11
  Administered 2015-10-11: 10 mL
  Filled 2015-10-11: qty 10

## 2015-10-11 MED ORDER — SODIUM CHLORIDE 0.9 % IV SOLN
375.0000 mg/m2 | Freq: Once | INTRAVENOUS | Status: AC
  Administered 2015-10-11: 860 mg via INTRAVENOUS
  Filled 2015-10-11: qty 43

## 2015-10-11 MED ORDER — SODIUM CHLORIDE 0.9 % IV SOLN: 10.0000 [IU]/m2 | Freq: Once | INTRAVENOUS | Status: DC

## 2015-10-11 MED ORDER — POTASSIUM CHLORIDE CRYS ER 20 MEQ PO TBCR
60.0000 meq | EXTENDED_RELEASE_TABLET | Freq: Two times a day (BID) | ORAL | Status: DC
  Administered 2015-10-11: 60 meq via ORAL
  Filled 2015-10-11 (×3): qty 3

## 2015-10-11 MED ORDER — SODIUM CHLORIDE 0.9 % IV SOLN
Freq: Once | INTRAVENOUS | Status: AC
  Administered 2015-10-11: 12:00:00 via INTRAVENOUS

## 2015-10-11 MED ORDER — HEPARIN SOD (PORK) LOCK FLUSH 100 UNIT/ML IV SOLN
500.0000 [IU] | Freq: Once | INTRAVENOUS | Status: AC | PRN
  Administered 2015-10-11: 500 [IU]

## 2015-10-11 MED ORDER — VINBLASTINE SULFATE CHEMO INJECTION 1 MG/ML
6.0000 mg/m2 | Freq: Once | INTRAVENOUS | Status: AC
  Administered 2015-10-11: 13.8 mg via INTRAVENOUS
  Filled 2015-10-11: qty 13.8

## 2015-10-11 MED ORDER — PALONOSETRON HCL INJECTION 0.25 MG/5ML
0.2500 mg | Freq: Once | INTRAVENOUS | Status: AC
  Administered 2015-10-11: 0.25 mg via INTRAVENOUS
  Filled 2015-10-11: qty 5

## 2015-10-11 NOTE — Assessment & Plan Note (Addendum)
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  Started ABVD on 09/13/2015 with curative intent.  Oncology history updated.  Pre-treatment labs: CBC diff, CMET.  I personally reviewed and went over laboratory results with the patient.  The results are noted within this dictation.  Hypokalemia is noted today.  Order is placed for 60 mEq PO K+ here in the clinic in addition to 40 mEq PO daily at home.   She will receive 2 cycles of ABVD followed by re-imaging with PET.  PET scan order is placed and will be completed in ~ 2-3 weeks.  Depending on PET results, future medical oncology recommendations will follow.  On her last treatment, she needed Neulasta.  She tolerated well.  She did use Claritin prophylactically and noted some minor bone aches.  She also used intermittent pain medication to help with sleep when bone ached.  She reports that it was very manageable.  Return in 2 weeks for follow-up and day 15 of cycle #2.

## 2015-10-11 NOTE — Progress Notes (Signed)
Holly Stephens tolerated chemo tx well without complaints. Labs shown to [facility] prior to chemo tx and orders obtained for Potassium today and BID at home. Pt verbalized understanding. Pt discharged self ambulatory in satisfactory condition with friend. VSS upon discharge

## 2015-10-11 NOTE — Progress Notes (Signed)
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  INTERVAL HISTORY: Holly Stephens 60 y.o. female returns for followup of Hodgkin's Lymphoma, nodular sclerosing-type, classical Hodgkin's Disease with lymphatic involvement in multiple lymph nodes superior and inferior to the diaphragm with PET imaging demonstrating findings concerning for pulmonary involvement (Stage III versus Stage IV disease ?pulmonary nodules x 2?).    [name], nodular sclerosis ([place])   07/23/2015 Procedure    Lext axilla lymph node biopsy      07/27/2015 Pathology Results    Classical [name], nodular sclerosis type.  Positive CD30, CD15, andPAX-5.  Negative CD45 and CD20.      08/31/2015 PET scan    Hypermetabolic lymphadenopathy. Nodal stations include: L supraclavicular nodes, L axillary nodes, mediastinal lymph nodes, periaortic retroperitoneal and periportal lymph nodes as well as L iliac and inguinal lymph nodes. Two concerning pulm nodules...      09/01/2015 Imaging    MUGA- Normal LEFT ventricular ejection fraction of 73%.  Normal LEFT ventricular wall motion.      09/01/2015 Procedure    PFTs-  Spirometry shows mild ventilatory defect without airflow obstruction and no bronchodilator improvement. Lung volumes are normal. Airway resistance is normal. DLCO normal. Essentially normal lung function      09/08/2015 Procedure    Port placed by Dr. Rosana Hoes      09/13/2015 -  Chemotherapy    ABVD      09/27/2015 Imaging    CT angio of chest- no gross PE seen. Improving axillary, mediastinal and upper abdominal adenopathy. Improved and possibly resolved RUL nodule and with resolved or obscured LLL nodule. Small L pleural effusion and mild L lower lobe atelectasis.       She is doing well.   She notes minor bone aches from Neulasta.  She did use Claritin prophylactically for neulasta-induced bone pain.  She notes that it was very manageable.  She reports that she rarely used a pain pill at HS for her aches.    She otherwise reports a good 2 weeks since she was last seen.  Review of Systems  Constitutional: Positive for weight loss. Negative for chills and fever.  HENT: Negative.   Eyes: Negative.   Respiratory: Negative.  Negative for cough.   Cardiovascular: Negative.  Negative for chest pain.  Gastrointestinal: Negative.  Negative for constipation, diarrhea, nausea and vomiting.  Genitourinary: Negative.   Musculoskeletal: Negative.   Skin: Negative.   Neurological: Negative.  Negative for weakness.  Endo/Heme/Allergies: Negative.   Psychiatric/Behavioral: Negative.     Past Medical History:  Diagnosis Date  . Arthritis    osteoarthritis  . Diabetes mellitus without complication ([place])   . HIV infection ([place])   . [name], nodular sclerosis ([place]) 08/23/2015  . Human immunodeficiency virus (HIV) disease ([place]) 02/23/2006   Qualifier: Diagnosis of  By: Megan Salon MD, John      Past Surgical History:  Procedure Laterality Date  . AXILLARY LYMPH NODE BIOPSY Left 07/23/2015   Procedure: EXCISIONAL BIOPSY LEFT AXILLARY LYMPH NODE;  Surgeon: [name], MD;  Location: [facility];  Service: General;  Laterality: Left;  . PORTACATH PLACEMENT Right 09/08/2015   Procedure: INSERTION OF CENTRAL VENOUS CATHETER WITH PORT FOR CHEMOTHERAPY;  Surgeon: [name], MD;  Location: [facility];  Service: General;  Laterality: Right;  . SHOULDER SURGERY Right    removal of bone spur    Family History  Problem Relation Age of Onset  . Hypertension Mother   . Cancer Mother 41    breast   . Hypertension Father   . Heart attack Father   . Pulmonary embolism Sister   . Parkinson's disease Brother     Social History   Social History  . Marital status: Widowed    Spouse  name: N/A  . Number of children: N/A  . Years of education: N/A   Social History Main Topics  . Smoking status: Never Smoker  . Smokeless tobacco: Never Used  . Alcohol use No  . Drug use: No  . Sexual activity: No     Comment: declined condoms   Other Topics Concern  . None   Social History Narrative  . None     PHYSICAL EXAMINATION  ECOG PERFORMANCE STATUS: 1 - Symptomatic but completely ambulatory  Vitals:   10/11/15 1050  BP: (!) 111/57  Pulse: 79  Resp: 20  Temp: 98.4 F (36.9 C)    GENERAL:alert, no distress, well nourished, well developed, comfortable, cooperative, obese, smiling and accompanied by her friend Holly Stephens, in chemo bed SKIN: skin color, texture, turgor are normal, no rashes or significant lesions HEAD: Normocephalic, No masses, lesions, tenderness or abnormalities EYES: normal, EOMI, Conjunctiva are pink and non-injected EARS: External ears normal OROPHARYNX:lips, buccal mucosa, and tongue normal and mucous membranes are moist  NECK: supple, trachea midline LYMPH:  not examined BREAST:not examined LUNGS: clear to auscultation  HEART: regular rate & rhythm ABDOMEN: truncal obesity, no tenderness to palpation of LUQ. BACK: Back symmetric, no curvature. EXTREMITIES:less then 2 second capillary refill, no joint deformities, effusion, or inflammation, no skin discoloration, no clubbing, no cyanosis NEURO: alert & oriented x 3 with fluent speech, no focal motor/sensory deficits, gait normal    LABORATORY DATA: CBC    Component Value Date/Time   WBC 14.2 (H) 10/11/2015 1055   RBC 4.45 10/11/2015 1055   HGB 12.7 10/11/2015 1055   HCT 39.5 10/11/2015 1055   PLT 188 10/11/2015 1055   MCV 88.8 10/11/2015 1055   MCH 28.5 10/11/2015 1055   MCHC 32.2 10/11/2015 1055   RDW 14.4 10/11/2015 1055   LYMPHSABS 3.0 10/11/2015 1055   MONOABS 0.9 10/11/2015 1055   EOSABS 0.0 10/11/2015 1055   BASOSABS 0.3 (H) 10/11/2015 1055      Chemistry        Component Value Date/Time   NA 138 10/11/2015 1055   K 2.8 (L) 10/11/2015 1055   CL 101 10/11/2015 1055   CO2 29 10/11/2015 1055   BUN 12 10/11/2015 1055   CREATININE 0.66 10/11/2015 1055   CREATININE 0.52 06/01/2015 0900      Component Value Date/Time   CALCIUM 9.3 10/11/2015 1055   ALKPHOS 48 10/11/2015 1055   AST 28 10/11/2015 1055   ALT 24 10/11/2015 1055   BILITOT 0.6 10/11/2015 1055        PENDING LABS:   RADIOGRAPHIC STUDIES:  Ct Angio Chest Pe W Or Wo Contrast  Result Date: 09/27/2015 CLINICAL DATA:  Sudden onset shortness of breath. Hodgkin's lymphoma. 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 EXAM: CT ANGIOGRAPHY CHEST WITH CONTRAST TECHNIQUE: Multidetector CT imaging of the chest was performed using the standard protocol during bolus administration of intravenous contrast. Multiplanar CT image reconstructions and MIPs were obtained to evaluate the vascular anatomy.  CONTRAST:  100 cc Isovue 370 COMPARISON:  Portable chest dated 09/08/2015. PET-CT dated 08/31/2015. FINDINGS: Mediastinum/Lymph Nodes: No enlarged mediastinal or hilar lymph nodes. Multiple enlarged left axillary lymph nodes are again demonstrated. The previous 2.8 cm short axis left axillary node currently has a short axis diameter 2.5 cm on image number 36 of series 4. There are also mildly enlarged right axillary lymph nodes. A previous right axillary node with a short axis diameter of 11 mm currently has a short axis diameter 8 mm on image number 48 of series 4. The previously demonstrated 12 mm short axis left internal mammary lymph node has a short axis diameter of 10 mm today on image number 58 of series 4. Evaluation of the pulmonary arteries is limited by motion artifacts and suboptimal bolus opacification of the pulmonary arteries compared to veins. No gross pulmonary arterial filling defects are seen. There are some pulmonary vein filling defects, intermixed non-opacified blood or motion artifacts, most  pronounced in the left upper lobe. Lungs/Pleura: Small left pleural effusion. Mild left lower lobe atelectasis. The previously demonstrated 8 mm right lower lobe nodule is not currently visualized. The previously demonstrated 8 mm right upper lobe nodule is significantly smaller, measuring approximately 4 mm in maximum diameter on image number 72 of series 6. This is difficult to the distinguish from normal vessel. The previously demonstrated 7 mm left lower lobe nodule is not seen today, possibly obscured by atelectasis. Upper abdomen: A small low density left adrenal mass is not changed significantly, measuring 1.5 x 1.3 cm on image number 128 of series 4. Previously demonstrated enlarged upper abdominal lymph nodes are significantly smaller. Currently, a left gastrohepatic ligament node has a short axis diameter of 19 mm on image number 154 and previously had a short axis diameter of 26 mm. Musculoskeletal: Extensive thoracic spine degenerative changes. Review of the MIP images confirms the above findings. IMPRESSION: 1. Limited evaluation of the pulmonary arteries due to extensive motion artifacts and suboptimal bolus timing with no gross pulmonary emboli seen. There are some pulmonary vein filling defects, intermixed non-opacified blood or motion artifacts. 2. Improving axillary, mediastinal and upper abdominal adenopathy. 3. Improved and possibly resolved right upper lobe nodule and with resolved or obscured left lower lobe nodule. 4. Stable left adrenal probable adenoma. 5. Small left pleural effusion and mild left lower lobe atelectasis. Electronically Signed   By: Claudie Revering M.D.   On: 09/27/2015 18:01     PATHOLOGY:    ASSESSMENT AND PLAN:  [name], nodular sclerosis ([facility]) Hodgkin's Lymphoma, nodular sclerosing-type, classical Hodgkin's Disease with lymphatic involvement in multiple lymph nodes superior and inferior to the diaphragm with PET imaging demonstrating findings concerning for  pulmonary involvement (Stage III versus Stage IV disease ?pulmonary nodules x 2?).  Started ABVD on 09/13/2015 with curative intent.  Oncology history updated.  Pre-treatment labs: CBC diff, CMET.  I personally reviewed and went over laboratory results with the patient.  The results are noted within this dictation.  Hypokalemia is noted today.  Order is placed for 60 mEq PO K+ here in the clinic in addition to 40 mEq PO daily at home.   She will receive 2 cycles of ABVD followed by re-imaging with PET.  PET scan order is placed and will be completed in ~ 2-3 weeks.  Depending on PET results, future medical oncology recommendations will follow.  On her last treatment, she needed Neulasta.  She tolerated well.  She did use Claritin prophylactically and noted  some minor bone aches.  She also used intermittent pain medication to help with sleep when bone ached.  She reports that it was very manageable.  Return in 2 weeks for follow-up and day 15 of cycle #2.   ORDERS PLACED FOR THIS ENCOUNTER: Orders Placed This Encounter  Procedures  . NM PET Image Restag (PS) Skull Base To Thigh    MEDICATIONS PRESCRIBED THIS ENCOUNTER: No orders of the defined types were placed in this encounter.   THERAPY PLAN:  ABVD with curative intent 2 cycles followed by PET imaging to evaluate response.  All questions were answered. The patient knows to call the clinic with any problems, questions or concerns. We can certainly see the patient much sooner if necessary.  Patient and plan discussed with Dr. Ancil Linsey and she is in agreement with the aforementioned.   This note is electronically signed by: Doy Mince 10/11/2015 6:17 PM

## 2015-10-11 NOTE — Patient Instructions (Signed)
[facility] Discharge Instructions for Patients Receiving Chemotherapy   Beginning January 23rd 2017 lab work for the [facility] will be done in the  Main lab at [facility] on 1st floor. If you have a lab appointment with the [facility] please come in thru the  Main Entrance and check in at the main information desk   Today you received the following chemotherapy agents Adriamycin,Vinblastin, Bleomycin and DTIC. Follow-up as scheduled. Call clinic for any questions or concerns  To help prevent nausea and vomiting after your treatment, we encourage you to take your nausea medication   If you develop nausea and vomiting, or diarrhea that is not controlled by your medication, call the clinic.  The clinic phone number is [phone]. Office hours are Monday-Friday 8:30am-5:00pm.  BELOW ARE SYMPTOMS THAT SHOULD BE REPORTED IMMEDIATELY:  *FEVER GREATER THAN 101.0 F  *CHILLS WITH OR WITHOUT FEVER  NAUSEA AND VOMITING THAT IS NOT CONTROLLED WITH YOUR NAUSEA MEDICATION  *UNUSUAL SHORTNESS OF BREATH  *UNUSUAL BRUISING OR BLEEDING  TENDERNESS IN MOUTH AND THROAT WITH OR WITHOUT PRESENCE OF ULCERS  *URINARY PROBLEMS  *BOWEL PROBLEMS  UNUSUAL RASH Items with * indicate a potential emergency and should be followed up as soon as possible. If you have an emergency after office hours please contact your primary care physician or go to the nearest emergency department.  Please call the clinic during office hours if you have any questions or concerns.   You may also contact the Patient Navigator at [phone] should you have any questions or need assistance in obtaining follow up care.      Resources For Cancer Patients and their Caregivers ? American Cancer Society: Can assist with transportation, wigs, general needs, runs Look Good Feel Better.        (978)526-2711 ? Cancer Care: Provides financial assistance, online support groups, medication/co-pay  assistance.  1-800-813-HOPE 7098609699) ? [place] Assists [place] cancer patients and their families through emotional , educational and financial support.  708-570-4706 ? [place] DSS Where to apply for food stamps, Medicaid and utility assistance. 830-879-3128 ? RCATS: Transportation to medical appointments. (705)602-0017 ? Social Security Administration: May apply for disability if have a Stage IV cancer. 781 711 2019 [phone] ? [employer], Disability and Transit Services: Assists with nutrition, care and transit needs. [phone]

## 2015-10-11 NOTE — Patient Instructions (Signed)
[place] at [facility] Discharge Instructions  RECOMMENDATIONS MADE BY THE CONSULTANT AND ANY TEST RESULTS WILL BE SENT TO YOUR REFERRING PHYSICIAN.  You were seen by [name] PA-C today. We will call you with your lab results. We will see you back in 2 weeks for your next treatment and follow up visit. You will have a PET scan in 3 weeks as well.   Thank you for choosing [place] at [facility] to provide your oncology and hematology care.  To afford each patient quality time with our provider, please arrive at least 15 minutes before your scheduled appointment time.   Beginning January 23rd 2017 lab work for the [facility] will be done in the  Main lab at [facility] on 1st floor. If you have a lab appointment with the [facility] please come in thru the  Main Entrance and check in at the main information desk  You need to re-schedule your appointment should you arrive 10 or more minutes late.  We strive to give you quality time with our providers, and arriving late affects you and other patients whose appointments are after yours.  Also, if you no show three or more times for appointments you may be dismissed from the clinic at the providers discretion.     Again, thank you for choosing [facility].  Our hope is that these requests will decrease the amount of time that you wait before being seen by our physicians.       _____________________________________________________________  Should you have questions after your visit to [facility], please contact our office at [phone] between the hours of 8:30 a.m. and 4:30 p.m.  Voicemails left after 4:30 p.m. will not be returned until the following business day.  For prescription refill requests, have your pharmacy contact our office.         Resources For Cancer Patients and their Caregivers ? American Cancer Society: Can assist with transportation,  wigs, general needs, runs Look Good Feel Better.        305-037-1117 ? Cancer Care: Provides financial assistance, online support groups, medication/co-pay assistance.  1-800-813-HOPE (630)506-7877) ? [place] Assists [place] cancer patients and their families through emotional , educational and financial support.  253 124 7580 ? [place] DSS Where to apply for food stamps, Medicaid and utility assistance. 281 838 3433 ? RCATS: Transportation to medical appointments. (506)301-4947 ? Social Security Administration: May apply for disability if have a Stage IV cancer. 817-153-9125 343 675 3543 ? [employer], Disability and Transit Services: Assists with nutrition, care and transit needs. [place] Support Programs: @10RELATIVEDAYS @ > Cancer Support Group  2nd Tuesday of the month 1pm-2pm, Journey Room  > Creative Journey  3rd Tuesday of the month 1130am-1pm, Journey Room  > Look Good Feel Better  1st Wednesday of the month 10am-12 noon, Journey Room (Call [place] to register [phone])

## 2015-10-12 ENCOUNTER — Other Ambulatory Visit (HOSPITAL_COMMUNITY): Payer: Self-pay

## 2015-10-12 DIAGNOSIS — E785 Hyperlipidemia, unspecified: Secondary | ICD-10-CM

## 2015-10-12 MED ORDER — PANTOPRAZOLE SODIUM 40 MG PO TBEC: 40.0000 mg | DELAYED_RELEASE_TABLET | Freq: Every day | ORAL | 2 refills | Status: DC

## 2015-10-26 ENCOUNTER — Encounter (HOSPITAL_BASED_OUTPATIENT_CLINIC_OR_DEPARTMENT_OTHER): Payer: 59 | Admitting: Hematology & Oncology

## 2015-10-26 ENCOUNTER — Encounter (HOSPITAL_COMMUNITY): Payer: Self-pay | Admitting: Hematology & Oncology

## 2015-10-26 ENCOUNTER — Ambulatory Visit (HOSPITAL_COMMUNITY)
Admission: RE | Admit: 2015-10-26 | Discharge: 2015-10-26 | Disposition: A | Discharge status: Home / Self Care | Payer: 59 | Source: Ambulatory Visit | Attending: Hematology & Oncology | Admitting: Hematology & Oncology

## 2015-10-26 ENCOUNTER — Encounter (HOSPITAL_COMMUNITY): Payer: 59 | Attending: Hematology & Oncology

## 2015-10-26 VITALS — BP 107/59 | HR 98 | Temp 97.7°F | Resp 24 | Wt 265.0 lb

## 2015-10-26 DIAGNOSIS — R530 Neoplastic (malignant) related fatigue: Secondary | ICD-10-CM

## 2015-10-26 DIAGNOSIS — R599 Enlarged lymph nodes, unspecified: Secondary | ICD-10-CM

## 2015-10-26 DIAGNOSIS — E876 Hypokalemia: Secondary | ICD-10-CM

## 2015-10-26 DIAGNOSIS — R634 Abnormal weight loss: Secondary | ICD-10-CM

## 2015-10-26 DIAGNOSIS — C8118 Nodular sclerosis classical Hodgkin lymphoma, lymph nodes of multiple sites: Secondary | ICD-10-CM | POA: Diagnosis not present

## 2015-10-26 DIAGNOSIS — I878 Other specified disorders of veins: Secondary | ICD-10-CM | POA: Insufficient documentation

## 2015-10-26 DIAGNOSIS — C8114 Nodular sclerosis classical Hodgkin lymphoma, lymph nodes of axilla and upper limb: Secondary | ICD-10-CM

## 2015-10-26 DIAGNOSIS — R0602 Shortness of breath: Secondary | ICD-10-CM

## 2015-10-26 DIAGNOSIS — I110 Hypertensive heart disease with heart failure: Secondary | ICD-10-CM | POA: Diagnosis not present

## 2015-10-26 DIAGNOSIS — J90 Pleural effusion, not elsewhere classified: Secondary | ICD-10-CM | POA: Insufficient documentation

## 2015-10-26 LAB — COMPREHENSIVE METABOLIC PANEL
ALT: 17 U/L (ref 14–54)
AST: 23 U/L (ref 15–41)
Albumin: 2.9 g/dL — ABNORMAL LOW (ref 3.5–5.0)
Alkaline Phosphatase: 33 U/L — ABNORMAL LOW (ref 38–126)
Anion gap: 12 (ref 5–15)
BILIRUBIN TOTAL: 0.5 mg/dL (ref 0.3–1.2)
BUN: 10 mg/dL (ref 6–20)
CHLORIDE: 100 mmol/L — AB (ref 101–111)
CO2: 23 mmol/L (ref 22–32)
Calcium: 9.1 mg/dL (ref 8.9–10.3)
Creatinine, Ser: 0.74 mg/dL (ref 0.44–1.00)
Glucose, Bld: 166 mg/dL — ABNORMAL HIGH (ref 65–99)
POTASSIUM: 3.1 mmol/L — AB (ref 3.5–5.1)
Sodium: 135 mmol/L (ref 135–145)
TOTAL PROTEIN: 6.3 g/dL — AB (ref 6.5–8.1)

## 2015-10-26 LAB — CBC WITH DIFFERENTIAL/PLATELET
BASOS ABS: 0.1 10*3/uL (ref 0.0–0.1)
BASOS PCT: 3 %
EOS ABS: 0 10*3/uL (ref 0.0–0.7)
Eosinophils Relative: 1 %
HEMATOCRIT: 34.9 % — AB (ref 36.0–46.0)
Hemoglobin: 11.2 g/dL — ABNORMAL LOW (ref 12.0–15.0)
LYMPHS ABS: 1.4 10*3/uL (ref 0.7–4.0)
LYMPHS PCT: 30 %
MCH: 27.8 pg (ref 26.0–34.0)
MCHC: 32.1 g/dL (ref 30.0–36.0)
MCV: 86.6 fL (ref 78.0–100.0)
MONO ABS: 1 10*3/uL (ref 0.1–1.0)
MONOS PCT: 21 %
NEUTROS ABS: 2.2 10*3/uL (ref 1.7–7.7)
Neutrophils Relative %: 45 %
PLATELETS: 243 10*3/uL (ref 150–400)
RBC: 4.03 MIL/uL (ref 3.87–5.11)
RDW: 15 % (ref 11.5–15.5)
WBC Morphology: INCREASED
WBC: 4.7 10*3/uL (ref 4.0–10.5)

## 2015-10-26 MED ORDER — POTASSIUM CHLORIDE CRYS ER 20 MEQ PO TBCR
40.0000 meq | EXTENDED_RELEASE_TABLET | Freq: Two times a day (BID) | ORAL | Status: DC
  Administered 2015-10-26: 40 meq via ORAL
  Filled 2015-10-26 (×3): qty 2

## 2015-10-26 MED ORDER — HEPARIN SOD (PORK) LOCK FLUSH 100 UNIT/ML IV SOLN
INTRAVENOUS | Status: AC
  Filled 2015-10-26: qty 5

## 2015-10-26 MED ORDER — HEPARIN SOD (PORK) LOCK FLUSH 100 UNIT/ML IV SOLN
500.0000 [IU] | Freq: Once | INTRAVENOUS | Status: AC
  Administered 2015-10-26: 500 [IU] via INTRAVENOUS

## 2015-10-26 NOTE — Patient Instructions (Signed)
[place] at [facility] Discharge Instructions  RECOMMENDATIONS MADE BY THE CONSULTANT AND ANY TEST RESULTS WILL BE SENT TO YOUR REFERRING PHYSICIAN.  You were seen by Dr. Whitney Muse Delay treatment 1 week, Treatment next Tuesday Prescriptions sent to pharmacy.  Thank you for choosing [place] at [facility] to provide your oncology and hematology care.  To afford each patient quality time with our provider, please arrive at least 15 minutes before your scheduled appointment time.   Beginning January 23rd 2017 lab work for the [facility] will be done in the  Main lab at [facility] on 1st floor. If you have a lab appointment with the [facility] please come in thru the  Main Entrance and check in at the main information desk  You need to re-schedule your appointment should you arrive 10 or more minutes late.  We strive to give you quality time with our providers, and arriving late affects you and other patients whose appointments are after yours.  Also, if you no show three or more times for appointments you may be dismissed from the clinic at the providers discretion.     Again, thank you for choosing [facility].  Our hope is that these requests will decrease the amount of time that you wait before being seen by our physicians.       _____________________________________________________________  Should you have questions after your visit to [facility], please contact our office at [phone] between the hours of 8:30 a.m. and 4:30 p.m.  Voicemails left after 4:30 p.m. will not be returned until the following business day.  For prescription refill requests, have your pharmacy contact our office.         Resources For Cancer Patients and their Caregivers ? American Cancer Society: Can assist with transportation, wigs, general needs, runs Look Good Feel Better.        847 784 2773 ? Cancer  Care: Provides financial assistance, online support groups, medication/co-pay assistance.  1-800-813-HOPE 424-377-9825) ? [place] Assists [place] cancer patients and their families through emotional , educational and financial support.  980-451-3298 ? [place] DSS Where to apply for food stamps, Medicaid and utility assistance. (909)706-6858 ? RCATS: Transportation to medical appointments. 912-444-3251 ? Social Security Administration: May apply for disability if have a Stage IV cancer. (254) 474-2441 980-888-2558 ? [employer], Disability and Transit Services: Assists with nutrition, care and transit needs. [place] Support Programs: @10RELATIVEDAYS @ > Cancer Support Group  2nd Tuesday of the month 1pm-2pm, Journey Room  > Creative Journey  3rd Tuesday of the month 1130am-1pm, Journey Room  > Look Good Feel Better  1st Wednesday of the month 10am-12 noon, Journey Room (Call [place] to register [phone])

## 2015-10-26 NOTE — Progress Notes (Signed)
Holly Neighbors, MD [place] [address]    [name], nodular sclerosis ([place])   07/23/2015 Procedure    Lext axilla lymph node biopsy      07/27/2015 Pathology Results    Classical [name], nodular sclerosis type.  Positive CD30, CD15, andPAX-5.  Negative CD45 and CD20.      08/31/2015 PET scan    Hypermetabolic lymphadenopathy. Nodal stations include: L supraclavicular nodes, L axillary nodes, mediastinal lymph nodes, periaortic retroperitoneal and periportal lymph nodes as well as L iliac and inguinal lymph nodes. Two concerning pulm nodules...      09/01/2015 Imaging    MUGA- Normal LEFT ventricular ejection fraction of 73%.  Normal LEFT ventricular wall motion.      09/01/2015 Procedure    PFTs-  Spirometry shows mild ventilatory defect without airflow obstruction and no bronchodilator improvement. Lung volumes are normal. Airway resistance is normal. DLCO normal. Essentially normal lung function      09/08/2015 Procedure    Port placed by Dr. Rosana Hoes      09/13/2015 -  Chemotherapy    ABVD                 CURRENT THERAPY: ABVD  INTERVAL HISTORY: Holly Stephens 60 y.o. female returns for followup of Hodgkin's Lymphoma, nodular sclerosing-type, classical Hodgkin's Disease with lymphatic involvement in multiple lymph nodes superior and inferior to the diaphragm with PET imaging demonstrating findings concerning for pulmonary involvement (Stage III versus Stage IV disease ?pulmonary nodules x 2?).  Patient is accompanied and in treatment bed. She is here to receive D15 of C2 of ABVD.   She states that she has been experiencing shortness of breath the last few days. It is exacerbated with walking and alleviated with rest. She notes worsening cough as well. She denies sick contacts.   She complains of severe fatigue and a persistent cough. Holly Stephens denies fever or nausea. She says she has been able to eat and her appetite is normal.  Weight however is down another 5 pounds from last visit.   Patient mentions that she has allergies from sitting outside.   Holly Stephens says her anxiety is better and she is still optimistic that things will be alright in the end. She says she just needs to get through these tough spots.    Review of Systems  Constitutional: Positive for malaise/fatigue. Negative for chills, fever and weight loss.       Severe fatigue  HENT: Negative.   Eyes: Negative.   Respiratory: Positive for cough and shortness of breath.        Shortness of breath with exertion Persistent cough  Cardiovascular: Negative for chest pain.  Gastrointestinal: Negative for nausea and vomiting.  Genitourinary: Negative.  Negative for dysuria.  Musculoskeletal: Negative.   Skin: Negative.   Neurological: Positive for weakness.  Endo/Heme/Allergies: Negative.   Psychiatric/Behavioral: Negative.   14 point review of systems was performed and is negative except as detailed under history of present illness and above   Past Medical History:  Diagnosis Date  . Arthritis    osteoarthritis  . Diabetes mellitus without complication ([place])   . HIV infection ([place])   . [name], nodular sclerosis ([place]) 08/23/2015  . Human immunodeficiency virus (HIV) disease ([place]) 02/23/2006   Qualifier: Diagnosis of  By: Megan Salon MD, John      Past Surgical History:  Procedure Laterality Date  . AXILLARY LYMPH NODE BIOPSY Left 07/23/2015  Procedure: EXCISIONAL BIOPSY LEFT AXILLARY LYMPH NODE;  Surgeon: [name], MD;  Location: [facility];  Service: General;  Laterality: Left;  . PORTACATH PLACEMENT Right 09/08/2015   Procedure: INSERTION OF CENTRAL VENOUS CATHETER WITH PORT FOR CHEMOTHERAPY;  Surgeon: [name], MD;  Location: [facility];  Service: General;  Laterality: Right;  . SHOULDER SURGERY Right    removal of bone spur    Family History  Problem Relation Age of Onset  . Hypertension Mother   . Cancer Mother 71    breast    . Hypertension Father   . Heart attack Father   . Pulmonary embolism Sister   . Parkinson's disease Brother     Social History   Social History  . Marital status: Widowed    Spouse name: N/A  . Number of children: N/A  . Years of education: N/A   Social History Main Topics  . Smoking status: Never Smoker  . Smokeless tobacco: Never Used  . Alcohol use No  . Drug use: No  . Sexual activity: No     Comment: declined condoms   Other Topics Concern  . None   Social History Narrative  . None     PHYSICAL EXAMINATION  ECOG PERFORMANCE STATUS: 1 - Symptomatic but completely ambulatory  Vitals:   10/26/15 1009  BP: (!) 107/59  Pulse: 98  Resp: (!) 24  Temp: 97.7 F (36.5 C)    GENERAL:alert, well nourished, well developed,  cooperative, obese, hair loss  SKIN: skin color, texture, turgor are normal, no rashes or significant lesions HEAD: Normocephalic, No masses, lesions, tenderness or abnormalities EYES: normal, EOMI, Conjunctiva are pink and non-injected EARS: External ears normal OROPHARYNX:lips, buccal mucosa, and tongue normal and mucous membranes are moist  NECK: supple, trachea midline LYMPH:  not examined BREAST:not examined LUNGS: clear to auscultation, no wheezing, no rhonchi, no crackles HEART: regular rate & rhythm ABDOMEN:obese BACK: Back symmetric, no curvature. EXTREMITIES:Positive for pitting edema in left lower extremity. less then 2 second capillary refill, no joint deformities, effusion, or inflammation, no skin discoloration, no clubbing, no cyanosis NEURO: alert & oriented x 3 with fluent speech, no focal motor/sensory deficits, gait normal   LABORATORY DATA: I have reviewed the data as listed. CBC    Component Value Date/Time   WBC 4.7 10/26/2015 1012   RBC 4.03 10/26/2015 1012   HGB 11.2 (L) 10/26/2015 1012   HCT 34.9 (L) 10/26/2015 1012   PLT 243 10/26/2015 1012   MCV 86.6 10/26/2015 1012   MCH 27.8 10/26/2015 1012   MCHC 32.1  10/26/2015 1012   RDW 15.0 10/26/2015 1012   LYMPHSABS 1.4 10/26/2015 1012   MONOABS 1.0 10/26/2015 1012   EOSABS 0.0 10/26/2015 1012   BASOSABS 0.1 10/26/2015 1012      Chemistry      Component Value Date/Time   NA 135 10/26/2015 1012   K 3.1 (L) 10/26/2015 1012   CL 100 (L) 10/26/2015 1012   CO2 23 10/26/2015 1012   BUN 10 10/26/2015 1012   CREATININE 0.74 10/26/2015 1012   CREATININE 0.52 06/01/2015 0900      Component Value Date/Time   CALCIUM 9.1 10/26/2015 1012   ALKPHOS 33 (L) 10/26/2015 1012   AST 23 10/26/2015 1012   ALT 17 10/26/2015 1012   BILITOT 0.5 10/26/2015 1012       RADIOGRAPHIC STUDIES: I have personally reviewed the radiological images as listed and agreed with the findings in the report.  Dg  Chest 2 View  Result Date: 10/26/2015 CLINICAL DATA:  Dry cough and sob x couple of days/no chest pain/non smoker/hodgkin lymphoma port placement 2017. EXAM: CHEST  2 VIEW COMPARISON:  CT 09/27/2015 FINDINGS: RIGHT-sided power port with tip in distal SVC. Normal cardiac silhouette. There is perihilar central venous congestion. Perihilar fullness consistent adenopathy similar to CT 09/27/2015. Small LEFT Pleural effusion. Degenerative osteophytosis of the thoracic spine. IMPRESSION: 1. Perihilar adenopathy and central venous congestion is similar to comparison exam. 2. Small LEFT effusion. Electronically Signed   By: Suzy Bouchard M.D.   On: 10/26/2015 10:58   Ct Angio Chest Pe W Or Wo Contrast  Result Date: 09/27/2015 CLINICAL DATA:  Sudden onset shortness of breath. Hodgkin's lymphoma. 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 EXAM: CT ANGIOGRAPHY CHEST WITH CONTRAST TECHNIQUE: Multidetector CT imaging of the chest was performed using the standard protocol during bolus administration of intravenous contrast. Multiplanar CT image reconstructions and MIPs were obtained to evaluate the vascular anatomy. CONTRAST:  100 cc Isovue 370 COMPARISON:  Portable chest dated  09/08/2015. PET-CT dated 08/31/2015. FINDINGS: Mediastinum/Lymph Nodes: No enlarged mediastinal or hilar lymph nodes. Multiple enlarged left axillary lymph nodes are again demonstrated. The previous 2.8 cm short axis left axillary node currently has a short axis diameter 2.5 cm on image number 36 of series 4. There are also mildly enlarged right axillary lymph nodes. A previous right axillary node with a short axis diameter of 11 mm currently has a short axis diameter 8 mm on image number 48 of series 4. The previously demonstrated 12 mm short axis left internal mammary lymph node has a short axis diameter of 10 mm today on image number 58 of series 4. Evaluation of the pulmonary arteries is limited by motion artifacts and suboptimal bolus opacification of the pulmonary arteries compared to veins. No gross pulmonary arterial filling defects are seen. There are some pulmonary vein filling defects, intermixed non-opacified blood or motion artifacts, most pronounced in the left upper lobe. Lungs/Pleura: Small left pleural effusion. Mild left lower lobe atelectasis. The previously demonstrated 8 mm right lower lobe nodule is not currently visualized. The previously demonstrated 8 mm right upper lobe nodule is significantly smaller, measuring approximately 4 mm in maximum diameter on image number 72 of series 6. This is difficult to the distinguish from normal vessel. The previously demonstrated 7 mm left lower lobe nodule is not seen today, possibly obscured by atelectasis. Upper abdomen: A small low density left adrenal mass is not changed significantly, measuring 1.5 x 1.3 cm on image number 128 of series 4. Previously demonstrated enlarged upper abdominal lymph nodes are significantly smaller. Currently, a left gastrohepatic ligament node has a short axis diameter of 19 mm on image number 154 and previously had a short axis diameter of 26 mm. Musculoskeletal: Extensive thoracic spine degenerative changes. Review of  the MIP images confirms the above findings. IMPRESSION: 1. Limited evaluation of the pulmonary arteries due to extensive motion artifacts and suboptimal bolus timing with no gross pulmonary emboli seen. There are some pulmonary vein filling defects, intermixed non-opacified blood or motion artifacts. 2. Improving axillary, mediastinal and upper abdominal adenopathy. 3. Improved and possibly resolved right upper lobe nodule and with resolved or obscured left lower lobe nodule. 4. Stable left adrenal probable adenoma. 5. Small left pleural effusion and mild left lower lobe atelectasis. Electronically Signed   By: Claudie Revering M.D.   On: 09/27/2015 18:01    PATHOLOGY: Diagnosis Lymph node for lymphoma, left axillary -  CLASSICAL [name], NODULAR SCLEROSIS TYPE.    ASSESSMENT AND PLAN:  [name], nodular sclerosis (HCC) Stage IIIA  Obesity HIV Altered tasted secondary to chemotherapy Chemotherapy related fatigue Diarrhea Shortness of breath Weight loss hypokalemia  Labs reviewed. Potassium will be replaced orally.  I am concerned about her ongoing SOB complaints. CXR was reviewed.  I do not like delaying curative therapy however will delay until next week given her constellation of symptoms. I have prescribed a Z pack, I am also considering discontinuing bleomycin at this point. We may need to consider repeating PFT's.  She has PET/CT scheduled for Monday. Will see her back on Tuesday, she will be put back on the treatment schedule as well.   She is advised to call if her symptoms do not improve over the next several days. She was also advised to report to the ED if her symptoms worsen.   All questions were answered. The patient knows to call the clinic with any problems, questions or concerns. We can certainly see the patient much sooner if necessary.  This document serves as a record of services personally performed by Ancil Linsey, MD. It was created on her behalf by  Elmyra Ricks, a trained medical scribe. The creation of this record is based on the scribe's personal observations and the provider's statements to them. This document has been checked and approved by the attending provider.  I have reviewed the above documentation for accuracy and completeness, and I agree with the above.  This note is electronically signed [id number] Cyril Mourning, MD  10/26/2015 3:27 PM

## 2015-10-26 NOTE — Patient Instructions (Signed)
Holly Stephens at [facility] Discharge Instructions  RECOMMENDATIONS MADE BY THE CONSULTANT AND ANY TEST RESULTS WILL BE SENT TO YOUR REFERRING PHYSICIAN.  No chemotherapy treatment today per MD discretion.  Please return as scheduled.    Thank you for choosing [place] at [facility] to provide your oncology and hematology care.  To afford each patient quality time with our provider, please arrive at least 15 minutes before your scheduled appointment time.   Beginning January 23rd 2017 lab work for the [facility] will be done in the  Main lab at [facility] on 1st floor. If you have a lab appointment with the [facility] please come in thru the  Main Entrance and check in at the main information desk  You need to re-schedule your appointment should you arrive 10 or more minutes late.  We strive to give you quality time with our providers, and arriving late affects you and other patients whose appointments are after yours.  Also, if you no show three or more times for appointments you may be dismissed from the clinic at the providers discretion.     Again, thank you for choosing [facility].  Our hope is that these requests will decrease the amount of time that you wait before being seen by our physicians.       _____________________________________________________________  Should you have questions after your visit to [facility], please contact our office at [phone] between the hours of 8:30 a.m. and 4:30 p.m.  Voicemails left after 4:30 p.m. will not be returned until the following business day.  For prescription refill requests, have your pharmacy contact our office.         Resources For Cancer Patients and their Caregivers ? American Cancer Society: Can assist with transportation, wigs, general needs, runs Look Good Feel Better.        (224)112-4803 ? Cancer Care: Provides financial assistance,  online support groups, medication/co-pay assistance.  1-800-813-HOPE 418-569-1874) ? [facility] Assists [place] cancer patients and their families through emotional , educational and financial support.  346-512-5499 ? [place] DSS Where to apply for food stamps, Medicaid and utility assistance. (901) 866-5864 ? [facility]: Transportation to medical appointments. (951)262-2979 ? Social Security Administration: May apply for disability if have a Stage IV cancer. (779) 665-2981 925-263-6860 ? [employer], Disability and Transit Services: Assists with nutrition, care and transit needs. [place] Support Programs: @10RELATIVEDAYS @ > Cancer Support Group  2nd Tuesday of the month 1pm-2pm, Journey Room  > Creative Journey  3rd Tuesday of the month 1130am-1pm, Journey Room  > Look Good Feel Better  1st Wednesday of the month 10am-12 noon, Journey Room (Call [facility] to register [phone])

## 2015-10-26 NOTE — Progress Notes (Signed)
Patient not treated today per MD discretion.  Patient stable and wheeled out by family at discharge from clinic.

## 2015-10-27 ENCOUNTER — Telehealth (HOSPITAL_COMMUNITY): Payer: Self-pay | Admitting: *Deleted

## 2015-10-27 ENCOUNTER — Other Ambulatory Visit: Payer: Self-pay

## 2015-10-27 ENCOUNTER — Telehealth (HOSPITAL_COMMUNITY): Payer: Self-pay | Admitting: Emergency Medicine

## 2015-10-27 ENCOUNTER — Emergency Department (HOSPITAL_COMMUNITY): Payer: 59

## 2015-10-27 ENCOUNTER — Encounter (HOSPITAL_COMMUNITY): Payer: Self-pay

## 2015-10-27 ENCOUNTER — Inpatient Hospital Stay (HOSPITAL_COMMUNITY)
Admission: EM | Admit: 2015-10-27 | Discharge: 2015-10-30 | DRG: 291 | Disposition: A | Discharge status: Home / Self Care | Payer: 59 | Attending: Family Medicine | Admitting: Family Medicine

## 2015-10-27 ENCOUNTER — Encounter (HOSPITAL_COMMUNITY): Payer: Self-pay | Admitting: Hematology & Oncology

## 2015-10-27 DIAGNOSIS — Z8249 Family history of ischemic heart disease and other diseases of the circulatory system: Secondary | ICD-10-CM

## 2015-10-27 DIAGNOSIS — Z6841 Body Mass Index (BMI) 40.0 and over, adult: Secondary | ICD-10-CM

## 2015-10-27 DIAGNOSIS — I10 Essential (primary) hypertension: Secondary | ICD-10-CM | POA: Diagnosis present

## 2015-10-27 DIAGNOSIS — I427 Cardiomyopathy due to drug and external agent: Secondary | ICD-10-CM | POA: Diagnosis present

## 2015-10-27 DIAGNOSIS — I110 Hypertensive heart disease with heart failure: Principal | ICD-10-CM | POA: Diagnosis present

## 2015-10-27 DIAGNOSIS — C8110 Nodular sclerosis classical Hodgkin lymphoma, unspecified site: Secondary | ICD-10-CM

## 2015-10-27 DIAGNOSIS — E119 Type 2 diabetes mellitus without complications: Secondary | ICD-10-CM | POA: Diagnosis present

## 2015-10-27 DIAGNOSIS — I509 Heart failure, unspecified: Secondary | ICD-10-CM

## 2015-10-27 DIAGNOSIS — C8118 Nodular sclerosis classical Hodgkin lymphoma, lymph nodes of multiple sites: Secondary | ICD-10-CM | POA: Diagnosis present

## 2015-10-27 DIAGNOSIS — C8190 Hodgkin lymphoma, unspecified, unspecified site: Secondary | ICD-10-CM | POA: Diagnosis present

## 2015-10-27 DIAGNOSIS — Z820 Family history of epilepsy and other diseases of the nervous system: Secondary | ICD-10-CM | POA: Diagnosis not present

## 2015-10-27 DIAGNOSIS — R0602 Shortness of breath: Secondary | ICD-10-CM | POA: Diagnosis present

## 2015-10-27 DIAGNOSIS — I350 Nonrheumatic aortic (valve) stenosis: Secondary | ICD-10-CM | POA: Diagnosis not present

## 2015-10-27 DIAGNOSIS — E669 Obesity, unspecified: Secondary | ICD-10-CM | POA: Diagnosis present

## 2015-10-27 DIAGNOSIS — Z809 Family history of malignant neoplasm, unspecified: Secondary | ICD-10-CM

## 2015-10-27 DIAGNOSIS — I429 Cardiomyopathy, unspecified: Secondary | ICD-10-CM | POA: Diagnosis not present

## 2015-10-27 DIAGNOSIS — B20 Human immunodeficiency virus [HIV] disease: Secondary | ICD-10-CM | POA: Diagnosis present

## 2015-10-27 DIAGNOSIS — T451X5A Adverse effect of antineoplastic and immunosuppressive drugs, initial encounter: Secondary | ICD-10-CM | POA: Diagnosis present

## 2015-10-27 DIAGNOSIS — J810 Acute pulmonary edema: Secondary | ICD-10-CM | POA: Diagnosis not present

## 2015-10-27 DIAGNOSIS — Z9221 Personal history of antineoplastic chemotherapy: Secondary | ICD-10-CM

## 2015-10-27 DIAGNOSIS — C8119 Nodular sclerosis classical Hodgkin lymphoma, extranodal and solid organ sites: Secondary | ICD-10-CM | POA: Diagnosis not present

## 2015-10-27 DIAGNOSIS — R000 Tachycardia, unspecified: Secondary | ICD-10-CM | POA: Diagnosis not present

## 2015-10-27 LAB — CBC WITH DIFFERENTIAL/PLATELET
BASOS ABS: 0.1 10*3/uL (ref 0.0–0.1)
BASOS PCT: 2 %
EOS ABS: 0.1 10*3/uL (ref 0.0–0.7)
EOS PCT: 1 %
HCT: 35 % — ABNORMAL LOW (ref 36.0–46.0)
Hemoglobin: 11.3 g/dL — ABNORMAL LOW (ref 12.0–15.0)
Lymphocytes Relative: 27 %
Lymphs Abs: 2.4 10*3/uL (ref 0.7–4.0)
MCH: 28 pg (ref 26.0–34.0)
MCHC: 32.3 g/dL (ref 30.0–36.0)
MCV: 86.8 fL (ref 78.0–100.0)
MONO ABS: 1.2 10*3/uL — AB (ref 0.1–1.0)
Monocytes Relative: 14 %
Neutro Abs: 5 10*3/uL (ref 1.7–7.7)
Neutrophils Relative %: 57 %
Platelets: 268 10*3/uL (ref 150–400)
RBC: 4.03 MIL/uL (ref 3.87–5.11)
RDW: 15.1 % (ref 11.5–15.5)
WBC: 8.9 10*3/uL (ref 4.0–10.5)

## 2015-10-27 LAB — BASIC METABOLIC PANEL
ANION GAP: 12 (ref 5–15)
BUN: 9 mg/dL (ref 6–20)
CALCIUM: 9.4 mg/dL (ref 8.9–10.3)
CO2: 25 mmol/L (ref 22–32)
CREATININE: 0.7 mg/dL (ref 0.44–1.00)
Chloride: 99 mmol/L — ABNORMAL LOW (ref 101–111)
GFR calc Af Amer: >60 mL/min (ref >60)
GLUCOSE: 112 mg/dL — AB (ref 65–99)
Potassium: 3.7 mmol/L (ref 3.5–5.1)
Sodium: 136 mmol/L (ref 135–145)

## 2015-10-27 LAB — TSH: TSH: 3.795 u[IU]/mL (ref 0.350–4.500)

## 2015-10-27 LAB — BRAIN NATRIURETIC PEPTIDE: B NATRIURETIC PEPTIDE 5: 127 pg/mL — AB (ref 0.0–100.0)

## 2015-10-27 LAB — TROPONIN I: TROPONIN I: 0.03 ng/mL — AB (ref <0.03)

## 2015-10-27 MED ORDER — ASPIRIN 81 MG PO CHEW
324.0000 mg | CHEWABLE_TABLET | Freq: Once | ORAL | Status: AC
Start: 2015-10-27 — End: 2015-10-27
  Administered 2015-10-27: 324 mg via ORAL
  Filled 2015-10-27: qty 4

## 2015-10-27 MED ORDER — LORAZEPAM 0.5 MG PO TABS
0.5000 mg | ORAL_TABLET | Freq: Three times a day (TID) | ORAL | Status: DC | PRN
  Administered 2015-10-29: 0.5 mg via ORAL
  Filled 2015-10-27: qty 1

## 2015-10-27 MED ORDER — FUROSEMIDE 10 MG/ML IJ SOLN
40.0000 mg | Freq: Once | INTRAMUSCULAR | Status: AC
  Administered 2015-10-27: 40 mg via INTRAVENOUS
  Filled 2015-10-27: qty 4

## 2015-10-27 MED ORDER — GUAIFENESIN-DM 100-10 MG/5ML PO SYRP
5.0000 mL | ORAL_SOLUTION | ORAL | Status: DC | PRN
  Administered 2015-10-28 – 2015-10-29 (×3): 5 mL via ORAL
  Filled 2015-10-27 (×3): qty 5

## 2015-10-27 MED ORDER — ENOXAPARIN SODIUM 40 MG/0.4ML ~~LOC~~ SOLN
40.0000 mg | SUBCUTANEOUS | Status: DC
  Administered 2015-10-28 – 2015-10-30 (×3): 40 mg via SUBCUTANEOUS
  Filled 2015-10-27 (×3): qty 0.4

## 2015-10-27 MED ORDER — DARUNAVIR ETHANOLATE 800 MG PO TABS
800.0000 mg | ORAL_TABLET | Freq: Every day | ORAL | Status: DC
  Administered 2015-10-28 – 2015-10-30 (×3): 800 mg via ORAL
  Filled 2015-10-27 (×5): qty 1

## 2015-10-27 MED ORDER — FENOFIBRATE 160 MG PO TABS
160.0000 mg | ORAL_TABLET | Freq: Every day | ORAL | Status: DC
  Administered 2015-10-28 – 2015-10-30 (×3): 160 mg via ORAL
  Filled 2015-10-27 (×3): qty 1

## 2015-10-27 MED ORDER — OXYCODONE-ACETAMINOPHEN 5-325 MG PO TABS
1.0000 | ORAL_TABLET | ORAL | Status: DC | PRN
  Administered 2015-10-28 (×2): 1 via ORAL
  Filled 2015-10-27 (×2): qty 1

## 2015-10-27 MED ORDER — POTASSIUM CHLORIDE CRYS ER 20 MEQ PO TBCR
20.0000 meq | EXTENDED_RELEASE_TABLET | Freq: Three times a day (TID) | ORAL | Status: DC
Start: 2015-10-27 — End: 2015-10-30
  Administered 2015-10-27 – 2015-10-30 (×8): 20 meq via ORAL
  Filled 2015-10-27 (×2): qty 1
  Filled 2015-10-27: qty 2
  Filled 2015-10-27 (×5): qty 1

## 2015-10-27 MED ORDER — PANTOPRAZOLE SODIUM 40 MG PO TBEC
40.0000 mg | DELAYED_RELEASE_TABLET | Freq: Every day | ORAL | Status: DC
  Administered 2015-10-28 – 2015-10-30 (×3): 40 mg via ORAL
  Filled 2015-10-27 (×3): qty 1

## 2015-10-27 MED ORDER — ELVITEG-COBIC-EMTRICIT-TENOFAF 150-150-200-10 MG PO TABS
1.0000 | ORAL_TABLET | Freq: Every day | ORAL | Status: DC
  Administered 2015-10-28 – 2015-10-30 (×3): 1 via ORAL
  Filled 2015-10-27 (×5): qty 1

## 2015-10-27 MED ORDER — IPRATROPIUM-ALBUTEROL 0.5-2.5 (3) MG/3ML IN SOLN
3.0000 mL | Freq: Once | RESPIRATORY_TRACT | Status: AC
  Administered 2015-10-27: 3 mL via RESPIRATORY_TRACT
  Filled 2015-10-27: qty 3

## 2015-10-27 MED ORDER — FUROSEMIDE 10 MG/ML IJ SOLN
40.0000 mg | Freq: Two times a day (BID) | INTRAMUSCULAR | Status: DC
  Administered 2015-10-28: 40 mg via INTRAVENOUS
  Filled 2015-10-27: qty 4

## 2015-10-27 NOTE — ED Provider Notes (Signed)
[facility] DEPT Provider Note   CSN: [id number] Arrival date & time: 10/27/15  1819     History   Chief Complaint Chief Complaint  Patient presents with  . Shortness of Breath    HPI Holly Stephens is a 60 y.o. female.  The history is provided by the patient.  Shortness of Breath  This is a new problem. The problem occurs frequently.The current episode started more than 2 days ago. The problem has been gradually worsening. Associated symptoms include cough and leg swelling. Pertinent negatives include no fever, no hemoptysis, no chest pain and no abdominal pain. She has tried nothing for the symptoms. Associated medical issues do not include asthma, COPD, chronic lung disease, PE or heart failure.   Patient with h/o lymphoma (currently on chemotherapy) and h/o HIV (well controlled with antiretrovirals, last CD4 in august >800) presents with worsening shortness of breath for past several days It is worse with exertion No fever/vomiting She reports dry cough She has also had left LE swelling  She is a nonsmoker No recent travel Seen by oncology yesterday, had negative CXR and was supposed to start azithromycin  Past Medical History:  Diagnosis Date  . Arthritis    osteoarthritis  . Diabetes mellitus without complication ([place])   . HIV infection ([place])   . 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 Human immunodeficiency virus (HIV) disease ([place]) 02/23/2006   Qualifier: Diagnosis of  By: Megan Salon MD, John      Patient Active Problem List   Diagnosis Date Noted  . 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 Visual floaters 09/12/2011  . Episodic low back pain 11/15/2010  . ROTATOR CUFF INJURY, RIGHT SHOULDER 11/03/2008  . ADHESIVE CAPSULITIS OF SHOULDER 11/12/2007  . OBESITY NOS 05/17/2006  . SYNDROME, CARPAL TUNNEL 05/17/2006  . FOOT PAIN 05/17/2006  . Human immunodeficiency virus (HIV) disease ([place]) 02/23/2006  . Hyperlipidemia 02/23/2006    . DEPRESSION 02/23/2006  . HYPERTENSION 02/23/2006    Past Surgical History:  Procedure Laterality Date  . AXILLARY LYMPH NODE BIOPSY Left 07/23/2015   Procedure: EXCISIONAL BIOPSY LEFT AXILLARY LYMPH NODE;  Surgeon: [name], MD;  Location: [facility];  Service: General;  Laterality: Left;  . PORTACATH PLACEMENT Right 09/08/2015   Procedure: INSERTION OF CENTRAL VENOUS CATHETER WITH PORT FOR CHEMOTHERAPY;  Surgeon: [name], MD;  Location: [facility];  Service: General;  Laterality: Right;  . SHOULDER SURGERY Right    removal of bone spur    OB History    No data available       Home Medications    Prior to Admission medications   Medication Sig Start Date End Date Taking? Authorizing Provider  acetaminophen (TYLENOL) 650 MG CR tablet Take 650 mg by mouth every 8 (eight) hours as needed for pain.    Historical Provider, MD  b complex vitamins capsule Take 1 capsule by mouth daily.    Historical Provider, MD  bleomycin in sodium chloride 0.9 % 50 mL Inject into the vein once. Every 2 weeks    Historical Provider, MD  calcium carbonate (OSCAL) 1500 (600 Ca) MG TABS tablet Take 600 mg of elemental calcium by mouth daily with breakfast.    Historical Provider, MD  cholecalciferol (VITAMIN D) 1000 units tablet Take 2,000 Units by mouth daily.    Historical Provider, MD  dacarbazine (DTIC) 200 MG chemo injection Inject into the vein once. Every 2 weeks    Historical Provider, MD  Darunavir  Ethanolate ([name]) 800 MG tablet Take 1 tablet (800 mg total) by mouth daily with breakfast. 02/24/15   Michel Bickers, MD  dexamethasone (DECADRON) 4 MG tablet Take 2 tablets by mouth once a day on the day after chemotherapy and then take 2 tablets two times a day for 2 days. Take with food. 09/13/15   Patrici Ranks, MD  Diclofenac Sodium (PENNSAID) 2 % SOLN Place 2 application onto the skin 2 (two) times daily.    Historical Provider, MD  DOXOrubicin (ADRIAMYCIN) 50 MG chemo injection Inject  60 mg into the vein. Every 2 weeks    Historical Provider, MD  elvitegravir-cobicistat-emtricitabine-tenofovir ([name]) 150-150-200-10 MG TABS tablet Take 1 tablet by mouth daily with breakfast. 02/24/15   Michel Bickers, MD  fenofibrate 160 MG tablet  07/12/15   Historical Provider, MD  folic acid (FOLVITE) [id number] MCG tablet Take 1 tablet (800 mcg total) by mouth daily. 11/28/13   Michel Bickers, MD  Garlic [id number] MG CAPS Take by mouth. Reported on 06/15/2015    Historical Provider, MD  JANUMET 50-500 MG per tablet Take 1 tablet by mouth 2 (two) times daily with a meal.  07/24/14   Historical Provider, MD  lidocaine-prilocaine (EMLA) cream Apply 1 application topically as needed. 09/01/15   Baird Cancer, PA-C  loratadine (CLARITIN) 10 MG tablet Take 1 tablet (10 mg total) by mouth daily. 09/27/15   Baird Cancer, PA-C  LORazepam (ATIVAN) 0.5 MG tablet Take 1 tablet (0.5 mg total) by mouth every 8 (eight) hours. 09/01/15   Baird Cancer, PA-C  metoprolol (LOPRESSOR) 50 MG tablet TAKE 1 TABLET DAILY 01/07/15   Michel Bickers, MD  Multiple Vitamin (MULTIVITAMIN) tablet Take 1 tablet by mouth daily.      Historical Provider, MD  naproxen sodium (ANAPROX) 220 MG tablet Take 440 mg by mouth 2 (two) times daily with a meal.    Historical Provider, MD  omega-3 fish oil (MAXEPA) 1000 MG CAPS capsule Take 1 capsule (1,000 mg total) by mouth daily. 11/28/13   Michel Bickers, MD  ondansetron (ZOFRAN) 8 MG tablet Take 1 tablet (8 mg total) by mouth every 8 (eight) hours as needed for nausea or vomiting. 09/01/15   Baird Cancer, PA-C  oxyCODONE-acetaminophen (ROXICET) 5-325 MG tablet Take 1 tablet by mouth every 4 (four) hours as needed for severe pain. 09/27/15   Baird Cancer, PA-C  pantoprazole (PROTONIX) 40 MG tablet Take 1 tablet (40 mg total) by mouth daily. 10/12/15   Baird Cancer, PA-C  potassium chloride SA (K-DUR,KLOR-CON) 20 MEQ tablet Take 1 tablet (20 mEq total) by mouth 2 (two) times daily. 10/11/15    Baird Cancer, PA-C  prochlorperazine (COMPAZINE) 10 MG tablet Take 1 tablet (10 mg total) by mouth every 6 (six) hours as needed for nausea or vomiting. 09/01/15   Baird Cancer, PA-C  spironolactone (ALDACTONE) 50 MG tablet Take 1 tablet (50 mg total) by mouth daily. 10/05/15   Baird Cancer, PA-C  vinBLAStine in sodium chloride 0.9 % 50 mL Inject into the vein. Every 2 weeks    Historical Provider, MD    Family History Family History  Problem Relation Age of Onset  . Hypertension Mother   . Cancer Mother 23    breast   . Hypertension Father   . Heart attack Father   . Pulmonary embolism Sister   . Parkinson's disease Brother     Social History Social History  Substance Use Topics  .  Smoking status: Never Smoker  . Smokeless tobacco: Never Used  . Alcohol use No     Allergies   Augmentin [amoxicillin-pot clavulanate]   Review of Systems Review of Systems  Constitutional: Negative for fever.  Respiratory: Positive for cough and shortness of breath. Negative for hemoptysis.   Cardiovascular: Positive for leg swelling. Negative for chest pain.  Gastrointestinal: Negative for abdominal pain.  All other systems reviewed and are negative.    Physical Exam Updated Vital Signs BP 116/62 (BP Location: Right Arm)   Pulse 88   Temp 97.5 F (36.4 C) (Oral)   Resp 17   Ht 5\' 1"  (1.549 m)   Wt 120.2 kg   SpO2 92%   BMI 50.07 kg/m   Physical Exam CONSTITUTIONAL: Chronically ill appearing HEAD: Normocephalic/atraumatic EYES: EOMI/PERRL ENMT: Mucous membranes moist NECK: supple no meningeal signs SPINE/BACK:entire spine nontender CV: S1/S2 noted, no murmurs/rubs/gallops noted LUNGS: crackles noted in left field.  Tachypnea noted.  Exam limited due to body habitus ABDOMEN: soft, nontender, she is obese GU:no cva tenderness NEURO: Pt is awake/alert/appropriate, moves all extremitiesx4.  No facial droop.   EXTREMITIES: pulses normal/equal, full ROM, mild pitting  edema noted to left LE SKIN: warm, color normal PSYCH: mildly anxious   ED Treatments / Results  Labs (all labs ordered are listed, but only abnormal results are displayed) Labs Reviewed  BASIC METABOLIC PANEL - Abnormal; Notable for the following:       Result Value   Chloride 99 (*)    Glucose, Bld 112 (*)    All other components within normal limits  CBC WITH DIFFERENTIAL/PLATELET - Abnormal; Notable for the following:    Hemoglobin 11.3 (*)    HCT 35.0 (*)    Monocytes Absolute 1.2 (*)    All other components within normal limits  TROPONIN I - Abnormal; Notable for the following:    Troponin I 0.03 (*)    All other components within normal limits  BRAIN NATRIURETIC PEPTIDE - Abnormal; Notable for the following:    B Natriuretic Peptide 127.0 (*)    All other components within normal limits    EKG  EKG Interpretation  Date/Time:  Wednesday October 27 2015 18:44:36 EDT Ventricular Rate:  87 PR Interval:    QRS Duration: 154 QT Interval:  400 QTC Calculation: 482 R Axis:   2 Text Interpretation:  Sinus rhythm Right bundle branch block artifact noted ECG OTHERWISE WITHIN NORMAL LIMITS Confirmed by Christy Gentles  MD, [facility] ([id number]) on 10/27/2015 6:54:00 PM       Radiology Dg Chest 2 View  Result Date: 10/26/2015 CLINICAL DATA:  Dry cough and sob x couple of days/no chest pain/non smoker/hodgkin lymphoma port placement 2017. EXAM: CHEST  2 VIEW COMPARISON:  CT 09/27/2015 FINDINGS: RIGHT-sided power port with tip in distal SVC. Normal cardiac silhouette. There is perihilar central venous congestion. Perihilar fullness consistent adenopathy similar to CT 09/27/2015. Small LEFT Pleural effusion. Degenerative osteophytosis of the thoracic spine. IMPRESSION: 1. Perihilar adenopathy and central venous congestion is similar to comparison exam. 2. Small LEFT effusion. Electronically Signed   By: Suzy Bouchard M.D.   On: 10/26/2015 10:58    Procedures Procedures (including critical  care time)  Medications Ordered in ED Medications  ipratropium-albuterol (DUONEB) 0.5-2.5 (3) MG/3ML nebulizer solution 3 mL (3 mLs Nebulization Given 10/27/15 1844)  furosemide (LASIX) injection 40 mg (40 mg Intravenous Given 10/27/15 2025)  aspirin chewable tablet 324 mg (324 mg Oral Given 10/27/15 2009)  Initial Impression / Assessment and Plan / ED Course  I have reviewed the triage vital signs and the nursing notes.  Pertinent labs & imaging results that were available during my care of the patient were reviewed by me and considered in my medical decision making (see chart for details).  Clinical Course    Pt with multiple medical conditions here with worsening SOB Imaging/labs pending  Pt found to have acute pulmonary edema This could be due to her chemotherapy She was given lasix She will need admission and echocardiogram Pt agreeable with plan   Final Clinical Impressions(s) / ED Diagnoses   Final diagnoses:  Acute congestive heart failure, unspecified congestive heart failure type [facility])    New Prescriptions New Prescriptions   No medications on file     Ripley Fraise, MD 10/27/15 2128

## 2015-10-27 NOTE — ED Triage Notes (Signed)
Patient via EMS, for c/o progressive Shortness of Breath, cough, Hodgkin's lymphoma, no resp history, CBG 132. Last Chemo 2 weeks ago.

## 2015-10-27 NOTE — Telephone Encounter (Signed)
Called pt she states that she has been having a dry cough for a few day, she gets to coughing so much that she gets SOB.  Talked with [name] PA she can take whatever OTC or pain medicaton, and we wrote her for apothecary cough syrup.  Pt notified and verbalized understanding.

## 2015-10-27 NOTE — ED Notes (Signed)
CRITICAL VALUE ALERT  Critical value received:  Troponin 0.03  Date of notification:  10/27/15  Time of notification:  1952  Critical value read back:Yes.    Nurse who received alert:  Derek Mound, RN  MD notified (1st page):  [place]  Time of first page:  1952  MD notified (2nd page):  Time of second page:  Responding MD:  Christy Gentles  Time MD responded:  [phone]

## 2015-10-27 NOTE — ED Notes (Addendum)
Pt ambulated to restroom. O2 sats were at 83% on room air when returned to bed. Pt placed bacj on O2 & sats returned to 96%

## 2015-10-27 NOTE — H&P (Signed)
History and Physical    Holly Stephens [id number] DOB: [birth year] DOA: 10/27/2015  PCP: [name], MD Patient coming from: Home   Chief Complaint: SOB for the past week.  No CP.   HPI: Holly Stephens is an 60 y.o. 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  She denied CP, coughs, fever, chills, or pleuritic CP.  She has not been on home oxygen, generally compliant with her meds, and has "good blood counts for her HIV".  Evaluation in the ER showed CXR with vascular congestion, EKG Showed no acute ST T changes, and K of 3.7, normal Cr and no leukocytosis.  She was given oxygen, along with IV Lasix, and hospitalist was asked to admit her for new onset of CHF.   ED Course:  See above.   Rewiew of Systems:  Constitutional: Negative for malaise, fever and chills. No significant weight loss or weight gain Eyes: Negative for eye pain, redness and discharge, diplopia, visual changes, or flashes of light. ENMT: Negative for ear pain, hoarseness, nasal congestion, sinus pressure and sore throat. No headaches; tinnitus, drooling, or problem swallowing. Cardiovascular: Negative for chest pain, palpitations, diaphoresis, dyspnea and peripheral edema. ; No orthopnea, PND Respiratory: Negative for cough, hemoptysis, wheezing and stridor. No pleuritic chestpain. Gastrointestinal: Negative for diarrhea, constipation,  melena, blood in stool, hematemesis, jaundice and rectal bleeding.    Genitourinary: Negative for frequency, dysuria, incontinence,flank pain and hematuria; Musculoskeletal: Negative for back pain and neck pain. Negative for swelling and trauma.;  Skin: . Negative for pruritus, rash, abrasions, bruising and skin lesion.; ulcerations Neuro: Negative for headache, lightheadedness and neck stiffness. Negative for  weakness, altered level of consciousness , altered mental status, extremity weakness, burning feet, involuntary movement, seizure and syncope.  Psych: negative for anxiety, depression, insomnia, tearfulness, panic attacks, hallucinations, paranoia, suicidal or homicidal ideation    Past Medical History:  Diagnosis Date  . Arthritis    osteoarthritis  . Diabetes mellitus without complication ([place])   . HIV infection ([place])   . [name], nodular sclerosis ([place]) 08/23/2015  . Human immunodeficiency virus (HIV) disease ([place]) 02/23/2006   Qualifier: Diagnosis of  By: Megan Salon MD, Lenora Boys of Systems:  Constitutional: Negative for malaise, fever and chills. No significant weight loss or weight gain Eyes: Negative for eye pain, redness and discharge, diplopia, visual changes, or flashes of light. ENMT: Negative for ear pain, hoarseness, nasal congestion, sinus pressure and sore throat. No headaches; tinnitus, drooling, or problem swallowing. Cardiovascular: Negative for chest pain, palpitations, diaphoresis, dyspnea and peripheral edema. ; No orthopnea, PND Respiratory: Negative for cough, hemoptysis, wheezing and stridor. No pleuritic chestpain. Gastrointestinal: Negative for nausea, vomiting, diarrhea, constipation, abdominal pain, melena, blood in stool, hematemesis, jaundice and rectal bleeding.    Genitourinary: Negative for frequency, dysuria, incontinence,flank pain and hematuria; Musculoskeletal: Negative for back pain and neck pain. Negative for swelling and trauma.;  Skin: . Negative for pruritus, rash, abrasions, bruising and skin lesion.; ulcerations Neuro: Negative for headache, lightheadedness and neck stiffness. Negative for weakness, altered level of consciousness , altered mental status, extremity weakness, burning feet, involuntary movement, seizure and syncope.  Psych: negative for anxiety, depression, insomnia, tearfulness, panic attacks, hallucinations, paranoia,  suicidal or homicidal ideation   Past Surgical History:  Procedure Laterality  Date  . AXILLARY LYMPH NODE BIOPSY Left 07/23/2015   Procedure: EXCISIONAL BIOPSY LEFT AXILLARY LYMPH NODE;  Surgeon: [name], MD;  Location: [facility];  Service: General;  Laterality: Left;  . PORTACATH PLACEMENT Right 09/08/2015   Procedure: INSERTION OF CENTRAL VENOUS CATHETER WITH PORT FOR CHEMOTHERAPY;  Surgeon: [name], MD;  Location: [facility];  Service: General;  Laterality: Right;  . SHOULDER SURGERY Right    removal of bone spur     reports that she has never smoked. She has never used smokeless tobacco. She reports that she does not drink alcohol or use drugs.  Allergies  Allergen Reactions  . Augmentin [Amoxicillin-Pot Clavulanate] Hives and Other (See Comments)    Has patient had a PCN reaction causing immediate rash, facial/tongue/throat swelling, SOB or lightheadedness with hypotension: No Has patient had a PCN reaction causing severe rash involving mucus membranes or skin necrosis: No Has patient had a PCN reaction that required hospitalization No Has patient had a PCN reaction occurring within the last 10 years: No If all of the above answers are "NO", then may proceed with Cephalosporin use.    Family History  Problem Relation Age of Onset  . Hypertension Mother   . Cancer Mother 68    breast   . Hypertension Father   . Heart attack Father   . Pulmonary embolism Sister   . Parkinson's disease Brother      Prior to Admission medications   Medication Sig Start Date End Date Taking? Authorizing Provider  acetaminophen (TYLENOL) 650 MG CR tablet Take 650 mg by mouth every 8 (eight) hours as needed for pain.   Yes Historical Provider, MD  bleomycin in sodium chloride 0.9 % 50 mL Inject into the vein every 14 (fourteen) days.    Yes Historical Provider, MD  cholecalciferol (VITAMIN D) 1000 units tablet Take 2,000 Units by mouth daily.   Yes Historical Provider, MD  dacarbazine  (DTIC) 200 MG chemo injection Inject into the vein every 14 (fourteen) days.    Yes Historical Provider, MD  Darunavir Ethanolate ([name]) 800 MG tablet Take 1 tablet (800 mg total) by mouth daily with breakfast. 02/24/15  Yes Michel Bickers, MD  dexamethasone (DECADRON) 4 MG tablet Take 8 mg by mouth 2 (two) times daily with a meal. Pt is to take for three days after chemo.   Yes Historical Provider, MD  Diclofenac Sodium (PENNSAID) 2 % SOLN Place 2 application onto the skin 2 (two) times daily as needed (for pain).    Yes Historical Provider, MD  DOXOrubicin (ADRIAMYCIN) 50 MG chemo injection Inject 60 mg into the vein every 14 (fourteen) days.    Yes Historical Provider, MD  elvitegravir-cobicistat-emtricitabine-tenofovir ([name]) 150-150-200-10 MG TABS tablet Take 1 tablet by mouth daily with breakfast. 02/24/15  Yes Michel Bickers, MD  fenofibrate 160 MG tablet Take 160 mg by mouth daily.    Yes Historical Provider, MD  Garlic 123XX123 MG CAPS Take 1,000 mg by mouth daily.    Yes Historical Provider, MD  JANUMET 50-500 MG per tablet Take 1 tablet by mouth 2 (two) times daily with a meal.    Yes Historical Provider, MD  lidocaine-prilocaine (EMLA) cream Apply 1 application topically as needed (prior to accessing port).   Yes Historical Provider, MD  loratadine (CLARITIN) 10 MG tablet Take 10 mg by mouth See admin instructions. Pt only takes the day before chemo and five days after.   Yes Historical Provider, MD  LORazepam (ATIVAN)  0.5 MG tablet Take 0.5 mg by mouth every 8 (eight) hours as needed for anxiety.   Yes Historical Provider, MD  metoprolol (LOPRESSOR) 50 MG tablet Take 50 mg by mouth daily.   Yes Historical Provider, MD  Multiple Vitamin (MULTIVITAMIN WITH MINERALS) TABS tablet Take 1 tablet by mouth daily.   Yes Historical Provider, MD  naproxen sodium (ANAPROX) 220 MG tablet Take 440 mg by mouth 2 (two) times daily as needed (for pain).    Yes Historical Provider, MD  ondansetron (ZOFRAN) 8  MG tablet Take 1 tablet (8 mg total) by mouth every 8 (eight) hours as needed for nausea or vomiting. 09/01/15  Yes Baird Cancer, PA-C  oxyCODONE-acetaminophen (ROXICET) 5-325 MG tablet Take 1 tablet by mouth every 4 (four) hours as needed for severe pain. 09/27/15  Yes Manon Hilding Kefalas, PA-C  pantoprazole (PROTONIX) 40 MG tablet Take 1 tablet (40 mg total) by mouth daily. 10/12/15  Yes Manon Hilding Kefalas, PA-C  potassium chloride SA (K-DUR,KLOR-CON) 20 MEQ tablet Take 20 mEq by mouth 3 (three) times daily.   Yes Historical Provider, MD  prochlorperazine (COMPAZINE) 10 MG tablet Take 1 tablet (10 mg total) by mouth every 6 (six) hours as needed for nausea or vomiting. 09/01/15  Yes Baird Cancer, PA-C  spironolactone (ALDACTONE) 50 MG tablet Take 1 tablet (50 mg total) by mouth daily. 10/05/15  Yes Manon Hilding Kefalas, PA-C  vinBLAStine in sodium chloride 0.9 % 50 mL Inject into the vein every 14 (fourteen) days.    Yes Historical Provider, MD    Physical Exam: Vitals:   10/27/15 1844 10/27/15 2006 10/27/15 2010 10/27/15 2020  BP:  98/62 109/65 123/63  Pulse:  93 95 103  Resp:  20 15 21   Temp:      TempSrc:      SpO2: 92% 94% 95% 90%  Weight:      Height:          Constitutional: NAD, calm, comfortable Vitals:   10/27/15 1844 10/27/15 2006 10/27/15 2010 10/27/15 2020  BP:  98/62 109/65 123/63  Pulse:  93 95 103  Resp:  20 15 21   Temp:      TempSrc:      SpO2: 92% 94% 95% 90%  Weight:      Height:       Eyes: PERRL, lids and conjunctivae normal ENMT: Mucous membranes are moist. Posterior pharynx clear of any exudate or lesions.Normal dentition.  Neck: normal, supple, no masses, no thyromegaly Respiratory: clear to auscultation bilaterally, no wheezing, bibasilar rales.  Normal respiratory effort. No accessory muscle use.  Cardiovascular: Regular rate and rhythm, no murmurs / rubs / gallops. No extremity edema. 2+ pedal pulses. No carotid bruits.  Abdomen: no tenderness, no masses  palpated. No hepatosplenomegaly. Bowel sounds positive.  Musculoskeletal: no clubbing / cyanosis. No joint deformity upper and lower extremities. Good ROM, no contractures. Normal muscle tone.  Skin: no rashes, lesions, ulcers. No induration Neurologic: CN 2-12 grossly intact. Sensation intact, DTR normal. Strength 5/5 in all 4.  Psychiatric: Normal judgment and insight. Alert and oriented x 3. Normal mood.     Labs on Admission: I have personally reviewed following labs and imaging studies  CBC:  Recent Labs Lab 10/26/15 1012 10/27/15 1835  WBC 4.7 8.9  NEUTROABS 2.2 5.0  HGB 11.2* 11.3*  HCT 34.9* 35.0*  MCV 86.6 86.8  PLT 243 [id number]   Basic Metabolic Panel:  Recent Labs Lab 10/26/15 1012 10/27/15 1835  NA 135 136  K 3.1* 3.7  CL 100* 99*  CO2 23 25  GLUCOSE 166* 112*  BUN 10 9  CREATININE 0.74 0.70  CALCIUM 9.1 9.4   GFR: Estimated Creatinine Clearance: 90.7 mL/min (by C-G formula based on SCr of 0.8 mg/dL). Liver Function Tests:  Recent Labs Lab 10/26/15 1012  AST 23  ALT 17  ALKPHOS 33*  BILITOT 0.5  PROT 6.3*  ALBUMIN 2.9*   Cardiac Enzymes:  Recent Labs Lab 10/27/15 1835  TROPONINI 0.03*    Radiological Exams on Admission: Dg Chest 2 View  Result Date: 10/27/2015 CLINICAL DATA:  Shortness of breath and nonproductive cough. EXAM: CHEST  2 VIEW COMPARISON:  10/26/2015 FINDINGS: There is a right chest wall port a catheter with tip in the projection of the cavoatrial junction. There is moderate cardiac enlargement. Aortic atherosclerosis noted Mild to moderate diffuse pulmonary edema is identified. No focal airspace consolidation identified. IMPRESSION: 1. Cardiac enlargement and pulmonary edema Electronically Signed   By: Kerby Moors M.D.   On: 10/27/2015 19:39   Dg Chest 2 View  Result Date: 10/26/2015 CLINICAL DATA:  Dry cough and sob x couple of days/no chest pain/non smoker/hodgkin lymphoma port placement 2017. EXAM: CHEST  2 VIEW COMPARISON:   CT 09/27/2015 FINDINGS: RIGHT-sided power port with tip in distal SVC. Normal cardiac silhouette. There is perihilar central venous congestion. Perihilar fullness consistent adenopathy similar to CT 09/27/2015. Small LEFT Pleural effusion. Degenerative osteophytosis of the thoracic spine. IMPRESSION: 1. Perihilar adenopathy and central venous congestion is similar to comparison exam. 2. Small LEFT effusion. Electronically Signed   By: Suzy Bouchard M.D.   On: 10/26/2015 10:58    EKG: Independently reviewed. NSR RBBB with no acute ST T changes.   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  Will obtain ECHO, and gently diurese her.  Will hold her betablocker for now.  Follow Cr and electrolytes carefully.  Consider consulting cardiology in this case, as she will require further chemotherapy for her nodular sclerosis lymphoma.  HIV:  Will continue with her HIV Meds.  DM:  Stable.  Hold oral meds, cover with sensitive insulin and continue with carb modified diet.  HTN:  Soft BP.  Hold all anti HTN at this time, especially with diuresing.    DVT prophylaxis: Lovenox.  Code Status: FULL CODE  (confirmed by me, tonight) Family Communication: family at bedside. Disposition Plan: to home when appropriate.  Consults called: None.  Admission status: Inpatient. She has new onset of CHF, SOB, needing diureses and testing. She will need over 2 midnights of delivered care.    Diego Ulbricht MD FACP. [facility]  If 7PM-7AM, please contact night-coverage [email or web address] Password [email or web address]  10/27/2015, 9:11 PM

## 2015-10-27 NOTE — ED Notes (Signed)
Pt ambulated to restroom & returned to room w/ no complications. 

## 2015-10-27 NOTE — Telephone Encounter (Signed)
-----   Message from Patrici Ranks, MD sent at 10/27/2015  8:50 AM EDT ----- If taking potassium bid, please increase to tid. Dr.P

## 2015-10-27 NOTE — Telephone Encounter (Signed)
Pt aware that she is to increase her potassium to tid. Pt verbalized understanding.

## 2015-10-28 ENCOUNTER — Inpatient Hospital Stay (HOSPITAL_COMMUNITY): Payer: 59

## 2015-10-28 DIAGNOSIS — C8119 Nodular sclerosis classical Hodgkin lymphoma, extranodal and solid organ sites: Secondary | ICD-10-CM

## 2015-10-28 DIAGNOSIS — I350 Nonrheumatic aortic (valve) stenosis: Secondary | ICD-10-CM

## 2015-10-28 DIAGNOSIS — I10 Essential (primary) hypertension: Secondary | ICD-10-CM

## 2015-10-28 LAB — ECHOCARDIOGRAM COMPLETE
CHL CUP STROKE VOLUME: 34 mL
E decel time: 280 msec
EERAT: 4.35
FS: 38 % (ref 28–44)
Height: 61 in
IVS/LV PW RATIO, ED: 1.05
LA vol index: 19.6 mL/m2
LADIAMINDEX: 1.37 cm/m2
LASIZE: 32 mm
LAVOL: 45.8 mL
LAVOLA4C: 37.7 mL
LDCA: 2.84 cm2
LEFT ATRIUM END SYS DIAM: 32 mm
LV PW d: 13.3 mm — AB (ref 0.6–1.1)
LV SIMPSON'S DISK: 59
LV TDI E'MEDIAL: 8.7
LV dias vol: 58 mL (ref 46–106)
LV e' LATERAL: 13.5 cm/s
LV sys vol index: 10 mL/m2
LV sys vol: 24 mL (ref 14–42)
LVDIAVOLIN: 25 mL/m2
LVEEAVG: 4.35
LVEEMED: 4.35
LVOT SV: 86 mL
LVOT VTI: 30.3 cm
LVOT diameter: 19 mm
LVOT peak grad rest: 11 mmHg
LVOT peak vel: 163 cm/s
MV Dec: 280
MVPKAVEL: 92.5 m/s
MVPKEVEL: 58.7 m/s
RV LATERAL S' VELOCITY: 11.6 cm/s
RV sys press: 36 mmHg
Reg peak vel: 289 cm/s
TAPSE: 21.9 mm
TDI e' lateral: 13.5
TRMAXVEL: 289 cm/s
Weight: 4183.45 oz

## 2015-10-28 LAB — COMPREHENSIVE METABOLIC PANEL
ALT: 15 U/L (ref 14–54)
AST: 19 U/L (ref 15–41)
Albumin: 2.8 g/dL — ABNORMAL LOW (ref 3.5–5.0)
Alkaline Phosphatase: 37 U/L — ABNORMAL LOW (ref 38–126)
Anion gap: 12 (ref 5–15)
BILIRUBIN TOTAL: 0.4 mg/dL (ref 0.3–1.2)
BUN: 10 mg/dL (ref 6–20)
CALCIUM: 9.1 mg/dL (ref 8.9–10.3)
CO2: 26 mmol/L (ref 22–32)
CREATININE: 0.59 mg/dL (ref 0.44–1.00)
Chloride: 98 mmol/L — ABNORMAL LOW (ref 101–111)
Glucose, Bld: 128 mg/dL — ABNORMAL HIGH (ref 65–99)
Potassium: 3.4 mmol/L — ABNORMAL LOW (ref 3.5–5.1)
Sodium: 136 mmol/L (ref 135–145)
TOTAL PROTEIN: 6.2 g/dL — AB (ref 6.5–8.1)

## 2015-10-28 LAB — MAGNESIUM: MAGNESIUM: 1.5 mg/dL — AB (ref 1.7–2.4)

## 2015-10-28 MED ORDER — FUROSEMIDE 10 MG/ML IJ SOLN
20.0000 mg | Freq: Two times a day (BID) | INTRAMUSCULAR | Status: DC
  Administered 2015-10-28 – 2015-10-29 (×2): 20 mg via INTRAVENOUS
  Filled 2015-10-28 (×2): qty 2

## 2015-10-28 MED ORDER — METOPROLOL TARTRATE 25 MG PO TABS
25.0000 mg | ORAL_TABLET | Freq: Every day | ORAL | Status: DC
  Administered 2015-10-29 – 2015-10-30 (×2): 25 mg via ORAL
  Filled 2015-10-28 (×2): qty 1

## 2015-10-28 MED ORDER — MAGNESIUM CHLORIDE 64 MG PO TBEC: 1.0000 | DELAYED_RELEASE_TABLET | Freq: Every day | ORAL | Status: DC

## 2015-10-28 MED ORDER — MAGNESIUM OXIDE 400 (241.3 MG) MG PO TABS
400.0000 mg | ORAL_TABLET | Freq: Every day | ORAL | Status: DC
  Administered 2015-10-28 – 2015-10-30 (×3): 400 mg via ORAL
  Filled 2015-10-28 (×3): qty 1

## 2015-10-28 NOTE — Progress Notes (Signed)
PROGRESS NOTE    Holly Stephens  [id number] DOB: 15-Jul-1955 DOA: 10/27/2015 PCP: [name], MD    Brief Narrative:  Holly Stephens is an 60 y.o. 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  She denied CP, coughs, fever, chills, or pleuritic CP.  She has not been on home oxygen, generally compliant with her meds, and has "good blood counts for her HIV".  Evaluation in the ER showed CXR with vascular congestion, EKG Showed no acute ST T changes, and K of 3.7, normal Cr and no leukocytosis.  She was given oxygen, along with IV Lasix, and hospitalist was asked to admit her for new onset of CHF.   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   DM - Stable - Hold oral meds, cover with sensitive insulin and continue with carb modified diet.  HTN - blood pressures running slightly low - will decrease lasix to 20mg  IV BID - will restart metoprolol to 25mg  daily   DVT prophylaxis: Lovenox Code Status: Full code Family Communication: Patient's sister and niece are bedside, all their questions were answered Disposition Plan: pending diuresis and evaluation by cardiology   Consultants:   Cardiology  Procedures:   none  Antimicrobials:   none     Subjective: Patient is awake and alert sitting looking out the window during evaluation.  She voices that she feels slightly less short of breath today than she did yesterday.  She mentions her blood pressure is normally slightly higher than it has been at times here.  Her systolic blood pressures normally are around low 100s.  She does voices she does not feel dizzy or weak. Patient and family asked pointed questions about heart failure and resolution of heart failure.  Objective: Vitals:   10/27/15 2217 10/27/15 2233 10/28/15 0614 10/28/15 1401  BP: (!) 103/51  (!) 94/49 (!) 104/54  Pulse: 95  (!) 102 (!) 104  Resp: (!) 24  14 16   Temp: 97.6 F (36.4 C)  99.8 F (37.7 C) 97.9 F (36.6 C)  TempSrc: Oral  Oral Oral  SpO2: 93% 93% 93% 93%  Weight: 119.2 kg (262 lb 11.2 oz)  118.6 kg (261 lb 7.5 oz)   Height:        Intake/Output Summary (Last 24 hours) at 10/28/15 1451 Last data filed at 10/28/15 1300  Gross per 24 hour  Intake              480 ml  Output                0 ml  Net              480 ml   Filed Weights   10/27/15 1821 10/27/15 2217 10/28/15  0614  Weight: 120.2 kg (265 lb) 119.2 kg (262 lb 11.2 oz) 118.6 kg (261 lb 7.5 oz)    Examination:  General exam: Appears calm and comfortable  Respiratory system: Mostly clear to auscultation, few bibasilar rales, Respiratory effort normal. Cardiovascular system: S1 & S2 heard, RRR. No JVD, murmurs, rubs, gallops or clicks. No pedal edema. Gastrointestinal system: Abdomen is obese, nondistended, soft and nontender. No organomegaly or masses felt. Normal bowel sounds heard. Central nervous system: Alert and oriented. No focal neurological deficits. Extremities: Symmetric 5 x 5 power. Skin: No rashes, lesions or ulcers Psychiatry: Judgement and insight appear normal. Mood & affect appropriate.     Data Reviewed: I have personally reviewed following labs and imaging studies  CBC:  Recent Labs Lab 10/26/15 1012  10/27/15 1835  WBC 4.7 8.9  NEUTROABS 2.2 5.0  HGB 11.2* 11.3*  HCT 34.9* 35.0*  MCV 86.6 86.8  PLT 243 [id number]   Basic Metabolic Panel:  Recent Labs Lab 10/26/15 1012 10/27/15 1835 10/28/15 0536  NA 135 136 136  K 3.1* 3.7 3.4*  CL 100* 99* 98*  CO2 23 25 26   GLUCOSE 166* 112* 128*  BUN 10 9 10   CREATININE 0.74 0.70 0.59  CALCIUM 9.1 9.4 9.1  MG  --   --  1.5*   GFR: Estimated Creatinine Clearance: 89.8 mL/min (by C-G formula based on SCr of 0.8 mg/dL). Liver Function Tests:  Recent Labs Lab 10/26/15 1012 10/28/15 0536  AST 23 19  ALT 17 15  ALKPHOS 33* 37*  BILITOT 0.5 0.4  PROT 6.3* 6.2*  ALBUMIN 2.9* 2.8*   No results for input(s): LIPASE, AMYLASE in the last 168 hours. No results for input(s): AMMONIA in the last 168 hours. Coagulation Profile: No results for input(s): INR, PROTIME in the last 168 hours. Cardiac Enzymes:  Recent Labs Lab 10/27/15 1835  TROPONINI 0.03*   BNP (last 3 results) No results for input(s): PROBNP in the last 8760 hours. HbA1C: No results for input(s): HGBA1C in the last 72 hours. CBG: No results for input(s): GLUCAP in the last 168 hours. Lipid Profile: No results for input(s): CHOL, HDL, LDLCALC, TRIG, CHOLHDL, LDLDIRECT in the last 72 hours. Thyroid Function Tests:  Recent Labs  10/27/15 1835  TSH 3.795   Anemia Panel: No results for input(s): VITAMINB12, FOLATE, FERRITIN, TIBC, IRON, RETICCTPCT in the last 72 hours. Sepsis Labs: No results for input(s): PROCALCITON, LATICACIDVEN in the last 168 hours.  No results found for this or any previous visit (from the past 240 hour(s)).       Radiology Studies: Dg Chest 2 View  Result Date: 10/27/2015 CLINICAL DATA:  Shortness of breath and nonproductive cough. EXAM: CHEST  2 VIEW COMPARISON:  10/26/2015 FINDINGS: There is a right chest wall port a catheter with tip in the projection of the cavoatrial junction. There is moderate cardiac enlargement. Aortic  atherosclerosis noted Mild to moderate diffuse pulmonary edema is identified. No focal airspace consolidation identified. IMPRESSION: 1. Cardiac enlargement and pulmonary edema Electronically Signed   By: Kerby Moors M.D.   On: 10/27/2015 19:39        Scheduled Meds: . Darunavir Ethanolate  800 mg Oral Q breakfast  . elvitegravir-cobicistat-emtricitabine-tenofovir  1 tablet Oral Q breakfast  . enoxaparin (LOVENOX) injection  40 mg Subcutaneous Q24H  . fenofibrate  160 mg Oral Daily  . furosemide  20 mg Intravenous BID  . magnesium oxide  400 mg Oral Daily  . pantoprazole  40 mg  Oral Daily  . potassium chloride SA  20 mEq Oral TID   Continuous Infusions:    LOS: 1 day    Time spent: 30 minutes    Newman Pies, MD [facility] Pager [phone]  If 7PM-7AM, please contact night-coverage [email or web address] Password [email or web address] 10/28/2015, 2:51 PM

## 2015-10-28 NOTE — Progress Notes (Signed)
*  PRELIMINARY RESULTS* Echocardiogram 2D Echocardiogram has been performed.  Holly Stephens 10/28/2015, 4:34 PM

## 2015-10-28 NOTE — Consult Note (Signed)
CARDIOLOGY CONSULT NOTE  Patient ID: Holly Stephens MRN: [id number] DOB/AGE: 09-07-1955 60 y.o.  Admit date: 10/27/2015 Primary Physician: Holly Neighbors, MD Referring Physician: Marin Comment MD  Reason for Consultation: pulmonary edema  HPI: 60 yr old woman with hx of nodular slerosis [name], on her 2nd cycle of ABVD (Dr Holly Stephens), hx of [name] on 2 drug regimen ( Dr Holly Stephens), HTN, Obesity, DM, presented to the ER with SOB for the past several days. Symptoms began with coughing which progressed to SOB with minimal exertion. Denies orthopnea and PND, as well as chest pain. Had left leg swelling.  ECG showed NSR with RBBB.  CXR showed "cardiac enlargement and pulmonary edema".  Started on IV Lasix by hospitalist service.    Allergies  Allergen Reactions  . Augmentin [Amoxicillin-Pot Clavulanate] Hives and Other (See Comments)    Has patient had a PCN reaction causing immediate rash, facial/tongue/throat swelling, SOB or lightheadedness with hypotension: No Has patient had a PCN reaction causing severe rash involving mucus membranes or skin necrosis: No Has patient had a PCN reaction that required hospitalization No Has patient had a PCN reaction occurring within the last 10 years: No If all of the above answers are "NO", then may proceed with Cephalosporin use.    Current Facility-Administered Medications  Medication Dose Route Frequency Provider Last Rate Last Dose  . Darunavir Ethanolate ([name]) tablet 800 mg  800 mg Oral Q breakfast Holly Falconer, MD   800 mg at 10/28/15 1332  . elvitegravir-cobicistat-emtricitabine-tenofovir ([name]) 150-150-200-10 MG tablet 1 tablet  1 tablet Oral Q breakfast Holly Falconer, MD   1 tablet at 10/28/15 1332  . enoxaparin (LOVENOX) injection 40 mg  40 mg Subcutaneous Q24H Holly Falconer, MD   40 mg at 10/28/15 [id number]  . fenofibrate tablet 160 mg  160 mg Oral Daily Holly Falconer, MD   160 mg at 10/28/15 [id number]  . furosemide (LASIX) injection 20 mg  20 mg  Intravenous BID Holly Bamberg, MD      . guaiFENesin-dextromethorphan [facility] DM) 100-10 MG/5ML syrup 5 mL  5 mL Oral Q4H PRN Holly Falconer, MD   5 mL at 10/28/15 [id number]  . LORazepam (ATIVAN) tablet 0.5 mg  0.5 mg Oral Q8H PRN Holly Falconer, MD      . magnesium oxide (MAG-OX) tablet 400 mg  400 mg Oral Daily Holly Bamberg, MD      . Derrill Memo ON 10/29/2015] metoprolol tartrate (LOPRESSOR) tablet 25 mg  25 mg Oral Daily Holly Bamberg, MD      . oxyCODONE-acetaminophen (PERCOCET/ROXICET) 5-325 MG per tablet 1 tablet  1 tablet Oral Q4H PRN Holly Falconer, MD   1 tablet at 10/28/15 1332  . pantoprazole (PROTONIX) EC tablet 40 mg  40 mg Oral Daily Holly Falconer, MD   40 mg at 10/28/15 [id number]  . potassium chloride SA (K-DUR,KLOR-CON) CR tablet 20 mEq  20 mEq Oral TID Holly Falconer, MD   20 mEq at 10/28/15 [id number]    Past Medical History:  Diagnosis Date  . Arthritis    osteoarthritis  . Diabetes mellitus without complication ([place])   . HIV infection ([place])   . [name], nodular sclerosis ([place]) 08/23/2015  . Human immunodeficiency virus (HIV) disease ([place]) 02/23/2006   Qualifier: Diagnosis of  By: Holly Salon MD, Holly Stephens      Past Surgical History:  Procedure Laterality Date  . AXILLARY LYMPH NODE BIOPSY Left 07/23/2015   Procedure: EXCISIONAL BIOPSY LEFT AXILLARY LYMPH  NODE;  Surgeon: Holly Epley, MD;  Location: [facility];  Service: General;  Laterality: Left;  . PORTACATH PLACEMENT Right 09/08/2015   Procedure: INSERTION OF CENTRAL VENOUS CATHETER WITH PORT FOR CHEMOTHERAPY;  Surgeon: Holly Epley, MD;  Location: [facility];  Service: General;  Laterality: Right;  . SHOULDER SURGERY Right    removal of bone spur    Social History   Social History  . Marital status: Widowed    Spouse name: N/A  . Number of children: N/A  . Years of education: N/A   Occupational History  . Not on file.   Social History Main Topics  . Smoking status: Never Smoker  . Smokeless tobacco: Never Used  . Alcohol use No  .  Drug use: No  . Sexual activity: No     Stephens: declined condoms   Other Topics Concern  . Not on file   Social History Narrative  . No narrative on file     No family history of premature CAD in 1st degree relatives.  Prior to Admission medications   Medication Sig Start Date End Date Taking? Authorizing Provider  acetaminophen (TYLENOL) 650 MG CR tablet Take 650 mg by mouth every 8 (eight) hours as needed for pain.   Yes Historical Provider, MD  bleomycin in sodium chloride 0.9 % 50 mL Inject into the vein every 14 (fourteen) days.    Yes Historical Provider, MD  cholecalciferol (VITAMIN D) 1000 units tablet Take 2,000 Units by mouth daily.   Yes Historical Provider, MD  dacarbazine (DTIC) 200 MG chemo injection Inject into the vein every 14 (fourteen) days.    Yes Historical Provider, MD  Darunavir Ethanolate ([name]) 800 MG tablet Take 1 tablet (800 mg total) by mouth daily with breakfast. 02/24/15  Yes Holly Bickers, MD  dexamethasone (DECADRON) 4 MG tablet Take 8 mg by mouth 2 (two) times daily with a meal. Pt is to take for three days after chemo.   Yes Historical Provider, MD  Diclofenac Sodium (PENNSAID) 2 % SOLN Place 2 application onto the skin 2 (two) times daily as needed (for pain).    Yes Historical Provider, MD  DOXOrubicin (ADRIAMYCIN) 50 MG chemo injection Inject 60 mg into the vein every 14 (fourteen) days.    Yes Historical Provider, MD  elvitegravir-cobicistat-emtricitabine-tenofovir ([name]) 150-150-200-10 MG TABS tablet Take 1 tablet by mouth daily with breakfast. 02/24/15  Yes Holly Bickers, MD  fenofibrate 160 MG tablet Take 160 mg by mouth daily.    Yes Historical Provider, MD  Garlic [id number] MG CAPS Take 1,000 mg by mouth daily.    Yes Historical Provider, MD  JANUMET 50-500 MG per tablet Take 1 tablet by mouth 2 (two) times daily with a meal.    Yes Historical Provider, MD  lidocaine-prilocaine (EMLA) cream Apply 1 application topically as needed (prior to  accessing port).   Yes Historical Provider, MD  loratadine (CLARITIN) 10 MG tablet Take 10 mg by mouth See admin instructions. Pt only takes the day before chemo and five days after.   Yes Historical Provider, MD  LORazepam (ATIVAN) 0.5 MG tablet Take 0.5 mg by mouth every 8 (eight) hours as needed for anxiety.   Yes Historical Provider, MD  metoprolol (LOPRESSOR) 50 MG tablet Take 50 mg by mouth daily.   Yes Historical Provider, MD  Multiple Vitamin (MULTIVITAMIN WITH MINERALS) TABS tablet Take 1 tablet by mouth daily.   Yes Historical Provider, MD  naproxen sodium (ANAPROX) 220 MG tablet  Take 440 mg by mouth 2 (two) times daily as needed (for pain).    Yes Historical Provider, MD  ondansetron (ZOFRAN) 8 MG tablet Take 1 tablet (8 mg total) by mouth every 8 (eight) hours as needed for nausea or vomiting. 09/01/15  Yes Baird Cancer, PA-C  oxyCODONE-acetaminophen (ROXICET) 5-325 MG tablet Take 1 tablet by mouth every 4 (four) hours as needed for severe pain. 09/27/15  Yes Manon Hilding Kefalas, PA-C  pantoprazole (PROTONIX) 40 MG tablet Take 1 tablet (40 mg total) by mouth daily. 10/12/15  Yes Manon Hilding Kefalas, PA-C  potassium chloride SA (K-DUR,KLOR-CON) 20 MEQ tablet Take 20 mEq by mouth 3 (three) times daily.   Yes Historical Provider, MD  prochlorperazine (COMPAZINE) 10 MG tablet Take 1 tablet (10 mg total) by mouth every 6 (six) hours as needed for nausea or vomiting. 09/01/15  Yes Baird Cancer, PA-C  spironolactone (ALDACTONE) 50 MG tablet Take 1 tablet (50 mg total) by mouth daily. 10/05/15  Yes Manon Hilding Kefalas, PA-C  vinBLAStine in sodium chloride 0.9 % 50 mL Inject into the vein every 14 (fourteen) days.    Yes Historical Provider, MD     Review of systems complete and found to be negative unless listed above in HPI     Physical exam Blood pressure (!) 104/54, pulse (!) 104, temperature 97.9 F (36.6 C), temperature source Oral, resp. rate 16, height 5\' 1"  (1.549 m), weight 261 lb 7.5 oz  (118.6 kg), SpO2 93 %. General: NAD Neck: Difficult to assess JVP.  Lungs: Diminished. CV: Nondisplaced PMI. Regular rate and rhythm, normal S1/S2, no S3/S4, no murmur.  Trace left leg pretibial edema.   Abdomen: Obese  Neurologic: Alert and oriented x 3.  Psych: Normal affect. Extremities: No clubbing or cyanosis.  HEENT: Normal.   ECG: Most recent ECG reviewed.  Labs:   Lab Results  Component Value Date   WBC 8.9 10/27/2015   HGB 11.3 (L) 10/27/2015   HCT 35.0 (L) 10/27/2015   MCV 86.8 10/27/2015   PLT 268 10/27/2015    Recent Labs Lab 10/28/15 0536  NA 136  K 3.4*  CL 98*  CO2 26  BUN 10  CREATININE 0.59  CALCIUM 9.1  PROT 6.2*  BILITOT 0.4  ALKPHOS 37*  ALT 15  AST 19  GLUCOSE 128*   Lab Results  Component Value Date   TROPONINI 0.03 (HH) 10/27/2015    Lab Results  Component Value Date   CHOL 228 (H) 06/01/2015   CHOL 228 (H) 05/29/2014   CHOL 167 11/10/2013   Lab Results  Component Value Date   HDL 38 (L) 06/01/2015   HDL 40 (L) 05/29/2014   HDL 43 11/10/2013   Lab Results  Component Value Date   LDLCALC 158 (H) 06/01/2015   LDLCALC 142 (H) 05/29/2014   LDLCALC 91 11/10/2013   Lab Results  Component Value Date   TRIG 162 (H) 06/01/2015   TRIG 231 (H) 05/29/2014   TRIG 163 (H) 11/10/2013   Lab Results  Component Value Date   CHOLHDL 6.0 (H) 06/01/2015   CHOLHDL 5.7 05/29/2014   CHOLHDL 3.9 11/10/2013   No results found for: LDLDIRECT       Studies: Dg Chest 2 View  Result Date: 10/27/2015 CLINICAL DATA:  Shortness of breath and nonproductive cough. EXAM: CHEST  2 VIEW COMPARISON:  10/26/2015 FINDINGS: There is a right chest wall port a catheter with tip in the projection of the cavoatrial junction. There  is moderate cardiac enlargement. Aortic atherosclerosis noted Mild to moderate diffuse pulmonary edema is identified. No focal airspace consolidation identified. IMPRESSION: 1. Cardiac enlargement and pulmonary edema Electronically  Signed   By: Kerby Moors M.D.   On: 10/27/2015 19:39    ASSESSMENT AND PLAN:  1. Pulmonary edema/new onset CHF: Awaiting echocardiogram to determine cardiac structure and function. LVEF by [facility] 09/01/15 73%. Suspicion for chemotherapy-induced cardiomyopathy.  Further medical therapy adjustments will be based on echo findings. On metoprolol and IV Lasix.  2. Essential HTN: Low normal. On metoprolol.   Signed: Kate Sable, M.D., F.A.C.C.  10/28/2015, 3:30 PM

## 2015-10-28 NOTE — Care Management Note (Signed)
Case Management Note  Patient Details  Name: Holly Stephens MRN: [id number] Date of Birth: [birth year]  Subjective/Objective:                  Admitted with new onset CHF secondary to chemo. Pt is from home, she lives alone and has strong support through friends. She is on short term disability from [employer] [place]. She reports normally driving herself to appointments but lately her friends have been driving her, at their request. She ambulates independently and had no DME needs PTA. She has a PCP and oncologist. She will also now follow closely with cardiology. She has insurance with drug coverage and no difficulty affording medications. She plans to return home with self care. [facility] discussed with pt and she does not feel she would benefit. She is confident in her ability to manage herself.   Action/Plan: No CM needs anticipated, will cont to follow.   Expected Discharge Date:    10/29/2015              Expected Discharge Plan:  Home/Self Care  In-House Referral:  NA  Discharge planning Services  NA  Post Acute Care Choice:  NA Choice offered to:  NA  DME Arranged:    DME Agency:     HH Arranged:    HH Agency:     Status of Service:  In process, will continue to follow  If discussed at Long Length of Stay Meetings, dates discussed:    Additional Comments:  Sherald Barge, RN 10/28/2015, 11:27 AM

## 2015-10-29 DIAGNOSIS — J810 Acute pulmonary edema: Secondary | ICD-10-CM

## 2015-10-29 DIAGNOSIS — R000 Tachycardia, unspecified: Secondary | ICD-10-CM

## 2015-10-29 LAB — BASIC METABOLIC PANEL
Anion gap: 8 (ref 5–15)
BUN: 10 mg/dL (ref 6–20)
CALCIUM: 8.6 mg/dL — AB (ref 8.9–10.3)
CHLORIDE: 101 mmol/L (ref 101–111)
CO2: 26 mmol/L (ref 22–32)
CREATININE: 0.65 mg/dL (ref 0.44–1.00)
Glucose, Bld: 134 mg/dL — ABNORMAL HIGH (ref 65–99)
Potassium: 3.6 mmol/L (ref 3.5–5.1)
SODIUM: 135 mmol/L (ref 135–145)

## 2015-10-29 MED ORDER — FUROSEMIDE 40 MG PO TABS
40.0000 mg | ORAL_TABLET | Freq: Every day | ORAL | Status: DC
  Administered 2015-10-29: 40 mg via ORAL
  Filled 2015-10-29: qty 1

## 2015-10-29 NOTE — Progress Notes (Signed)
Consulting cardiologist: Kate Sable MD Primary Cardiologist: Kate Sable MD  Cardiology Specific Problem List: 1. Pulmonary Edema 2. Hypertension  Subjective:    Breathing about the same. Complaints of frequent coughing.   Objective:   Temp:  [97.9 F (36.6 C)-98.9 F (37.2 C)] 98.9 F (37.2 C) (09/08 0600) Pulse Rate:  [95-104] 99 (09/08 0600) Resp:  [16-24] 24 (09/08 0600) BP: (92-104)/(38-54) 92/38 (09/08 0600) SpO2:  [90 %-93 %] 90 % (09/08 0600) Weight:  [260 lb (117.9 kg)] 260 lb (117.9 kg) (09/08 0500) Last BM Date: 10/29/15  Filed Weights   10/27/15 2217 10/28/15 0614 10/29/15 0500  Weight: 262 lb 11.2 oz (119.2 kg) 261 lb 7.5 oz (118.6 kg) 260 lb (117.9 kg)    Intake/Output Summary (Last 24 hours) at 10/29/15 0931 Last data filed at 10/28/15 1300  Gross per 24 hour  Intake              480 ml  Output                0 ml  Net              480 ml    Telemetry: Sinus tachycardia with NSVT.   Exam:  General: Ill appearing   HEENT: Conjunctiva and lids normal, oropharynx clear.  Lungs: Clear to auscultation, nonlabored.Frequent coughing  Cardiac: No elevated JVP or bruits. RRR, tachycardic,  no gallop or rub.   Abdomen: Normoactive bowel sounds, nontender, nondistended.Obese  Extremities: No pitting edema, distal pulses full.  Neuropsychiatric: Alert and oriented x3, affect appropriate.   Lab Results:  Basic Metabolic Panel:  Recent Labs Lab 10/27/15 1835 10/28/15 0536 10/29/15 0603  NA 136 136 135  K 3.7 3.4* 3.6  CL 99* 98* 101  CO2 25 26 26   GLUCOSE 112* 128* 134*  BUN 9 10 10   CREATININE 0.70 0.59 0.65  CALCIUM 9.4 9.1 8.6*  MG  --  1.5*  --     Liver Function Tests:  Recent Labs Lab 10/26/15 1012 10/28/15 0536  AST 23 19  ALT 17 15  ALKPHOS 33* 37*  BILITOT 0.5 0.4  PROT 6.3* 6.2*  ALBUMIN 2.9* 2.8*    CBC:  Recent Labs Lab 10/26/15 1012 10/27/15 1835  WBC 4.7 8.9  HGB 11.2* 11.3*  HCT 34.9*  35.0*  MCV 86.6 86.8  PLT 243 268    Cardiac Enzymes:  Recent Labs Lab 10/27/15 1835  TROPONINI 0.03*   Echocardiogram 10/28/2015 Left ventricle: The cavity size was normal. Wall thickness was   increased in a pattern of moderate LVH. Systolic function was   normal. The estimated ejection fraction was in the range of 60%   to 65%. Doppler parameters are consistent with abnormal left   ventricular relaxation (grade 1 diastolic dysfunction). - Right ventricle: The cavity size was mildly dilated. Wall   thickness was normal. - Tricuspid valve: There was mild regurgitation. - Pulmonary arteries: PA peak pressure: 36 mm Hg (S).  Radiology: Dg Chest 2 View  Result Date: 10/27/2015 CLINICAL DATA:  Shortness of breath and nonproductive cough. EXAM: CHEST  2 VIEW COMPARISON:  10/26/2015 FINDINGS: There is a right chest wall port a catheter with tip in the projection of the cavoatrial junction. There is moderate cardiac enlargement. Aortic atherosclerosis noted Mild to moderate diffuse pulmonary edema is identified. No focal airspace consolidation identified. IMPRESSION: 1. Cardiac enlargement and pulmonary edema Electronically Signed   By: Kerby Moors M.D.   On: 10/27/2015  19:39     Medications:   Scheduled Medications: . Darunavir Ethanolate  800 mg Oral Q breakfast  . elvitegravir-cobicistat-emtricitabine-tenofovir  1 tablet Oral Q breakfast  . enoxaparin (LOVENOX) injection  40 mg Subcutaneous Q24H  . fenofibrate  160 mg Oral Daily  . furosemide  20 mg Intravenous BID  . magnesium oxide  400 mg Oral Daily  . metoprolol tartrate  25 mg Oral Daily  . pantoprazole  40 mg Oral Daily  . potassium chloride SA  20 mEq Oral TID         PRN Medications:  guaiFENesin-dextromethorphan, LORazepam, oxyCODONE-acetaminophen   Assessment and Plan:   1. Pulmonary Edema: Echo demonstrates normal LV fx with grade 1 diastolic dysfunction. She has not diuresed very much from low dose lasix  if I/O is accurate. She states she is urinating a lot. Creatinine 0.65.  She is still having issues with breathing and frequent coughing.  Lungs are clear, no wheezes. May be able to change to po lasix now. Weight is down 5 lbs since admission.   2. Tachycardia: Rates in the low 100's with NSVT per telemetry. Consider adding digoxin as BP is soft.   3.  [name]: Followed by Dr. Whitney Muse.    Phill Myron. Lawrence NP [place]  10/29/2015, 9:31 AM   Pt still SOB but this has improved. LVEF is normal. Will switch to oral Lasix 40 mg daily and may be able to take prn after discharge. Metoprolol can be increased as needed for HR control.

## 2015-10-29 NOTE — Progress Notes (Signed)
PROGRESS NOTE    Holly Stephens  [id number] DOB: 1955/09/09 DOA: 10/27/2015 PCP: [name], MD    Brief Narrative:  Holly Stephens is an 60 y.o. 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  She denied CP, coughs, fever, chills, or pleuritic CP.  She has not been on home oxygen, generally compliant with her meds, and has "good blood counts for her HIV".  Evaluation in the ER showed CXR with vascular congestion, EKG Showed no acute ST T changes, and K of 3.7, normal Cr and no leukocytosis.  She was given oxygen, along with IV Lasix, and hospitalist was asked to admit her for new onset of CHF.   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 Wall thickness was increased in a pattern of moderate LVH. Systolic function was normal. The estimated ejection fraction was in the range of 60% to 65%. Doppler parameters are consistent with abnormal left ventricular relaxation (grade 1 diastolic dysfunction). Right ventricle: The cavity size was mildly dilated. Wall   thickness was normal. Tricuspid valve: There was mild regurgitation. Pulmonary arteries: PA peak pressure: 36 mm Hg (S). - Cardiology consulted as if this is related to ABVD chemotherapy will appreciate their recommendations for treatment - Lasix 40mg  PO daily - will restart beta blocker at lower dose but can increase as tolerated to control heart rate  HIV - Will continue with her HIV Meds.   DM - Stable - Hold oral meds, cover with sensitive insulin  and continue with carb modified diet.  HTN - blood pressures running slightly low - Lasix 40mg  PO daily - will restart metoprolol to 25mg  daily (can increase to 50mg  daily to manage tachycardia)   DVT prophylaxis: Lovenox Code Status: Full code Family Communication:  Disposition Plan: anticipate discharge with improvement of dyspneic symptoms (likely 24-48 hours)   Consultants:   Cardiology  Procedures:   none  Antimicrobials:   none    Subjective: Patient is being visited by friends at time of exam.  She reports that she feels better today and is breathing easier than she was yesterday.  She denies any feelings of lightheadedness, dizziness or weakness.  Denies palpitations.  Voices she would like to go home before the end of the weekend if that is possible.  Objective: Vitals:   10/28/15 1938 10/28/15 2100 10/29/15 0500 10/29/15 0600  BP:  (!) 99/43  (!) 92/38  Pulse:  95  99  Resp:  16  (!) 24  Temp:  98.7 F (37.1 C)  98.9 F (37.2 C)  TempSrc:  Oral  Oral  SpO2: 92% 92%  90%  Weight:   117.9 kg (260 lb)   Height:        Intake/Output Summary (Last 24 hours) at 10/29/15 1213 Last data filed at 10/28/15 1300  Gross per 24 hour  Intake              240 ml  Output  0 ml  Net              240 ml   Filed Weights   10/27/15 2217 10/28/15 0614 10/29/15 0500  Weight: 119.2 kg (262 lb 11.2 oz) 118.6 kg (261 lb 7.5 oz) 117.9 kg (260 lb)    Examination:  General exam: Appears calm and comfortable  Respiratory system: Mostly clear to auscultation, few bibasilar rales, Respiratory effort normal. Cardiovascular system: S1 & S2 heard, RRR. No JVD, murmurs, rubs, gallops or clicks. No pedal edema. Gastrointestinal system: Abdomen is obese, nondistended, soft and nontender. No organomegaly or masses felt. Normal bowel sounds heard. Central nervous system: Alert and oriented. No focal neurological deficits. Extremities: Symmetric 5 x 5 power. Skin: No  rashes, lesions or ulcers Psychiatry: Judgement and insight appear normal. Mood & affect appropriate.     Data Reviewed: I have personally reviewed following labs and imaging studies  CBC:  Recent Labs Lab 10/26/15 1012 10/27/15 1835  WBC 4.7 8.9  NEUTROABS 2.2 5.0  HGB 11.2* 11.3*  HCT 34.9* 35.0*  MCV 86.6 86.8  PLT 243 [id number]   Basic Metabolic Panel:  Recent Labs Lab 10/26/15 1012 10/27/15 1835 10/28/15 0536 10/29/15 0603  NA 135 136 136 135  K 3.1* 3.7 3.4* 3.6  CL 100* 99* 98* 101  CO2 23 25 26 26   GLUCOSE 166* 112* 128* 134*  BUN 10 9 10 10   CREATININE 0.74 0.70 0.59 0.65  CALCIUM 9.1 9.4 9.1 8.6*  MG  --   --  1.5*  --    GFR: Estimated Creatinine Clearance: 89.5 mL/min (by C-G formula based on SCr of 0.8 mg/dL). Liver Function Tests:  Recent Labs Lab 10/26/15 1012 10/28/15 0536  AST 23 19  ALT 17 15  ALKPHOS 33* 37*  BILITOT 0.5 0.4  PROT 6.3* 6.2*  ALBUMIN 2.9* 2.8*   No results for input(s): LIPASE, AMYLASE in the last 168 hours. No results for input(s): AMMONIA in the last 168 hours. Coagulation Profile: No results for input(s): INR, PROTIME in the last 168 hours. Cardiac Enzymes:  Recent Labs Lab 10/27/15 1835  TROPONINI 0.03*   BNP (last 3 results) No results for input(s): PROBNP in the last 8760 hours. HbA1C: No results for input(s): HGBA1C in the last 72 hours. CBG: No results for input(s): GLUCAP in the last 168 hours. Lipid Profile: No results for input(s): CHOL, HDL, LDLCALC, TRIG, CHOLHDL, LDLDIRECT in the last 72 hours. Thyroid Function Tests:  Recent Labs  10/27/15 1835  TSH 3.795   Anemia Panel: No results for input(s): VITAMINB12, FOLATE, FERRITIN, TIBC, IRON, RETICCTPCT in the last 72 hours. Sepsis Labs: No results for input(s): PROCALCITON, LATICACIDVEN in the last 168 hours.  No results found for this or any previous visit (from the past 240 hour(s)).       Radiology Studies: Dg Chest 2 View  Result  Date: 10/27/2015 CLINICAL DATA:  Shortness of breath and nonproductive cough. EXAM: CHEST  2 VIEW COMPARISON:  10/26/2015 FINDINGS: There is a right chest wall port a catheter with tip in the projection of the cavoatrial junction. There is moderate cardiac enlargement. Aortic atherosclerosis noted Mild to moderate diffuse pulmonary edema is identified. No focal airspace consolidation identified. IMPRESSION: 1. Cardiac enlargement and pulmonary edema Electronically Signed   By: Kerby Moors M.D.   On: 10/27/2015 19:39        Scheduled Meds: . Darunavir Ethanolate  800 mg Oral Q breakfast  . elvitegravir-cobicistat-emtricitabine-tenofovir  1 tablet Oral  Q breakfast  . enoxaparin (LOVENOX) injection  40 mg Subcutaneous Q24H  . fenofibrate  160 mg Oral Daily  . furosemide  40 mg Oral Daily  . magnesium oxide  400 mg Oral Daily  . metoprolol tartrate  25 mg Oral Daily  . pantoprazole  40 mg Oral Daily  . potassium chloride SA  20 mEq Oral TID   Continuous Infusions:    LOS: 2 days    Time spent: 30 minutes    Newman Pies, MD [facility] Pager [phone]  If 7PM-7AM, please contact night-coverage [email or web address] Password [email or web address] 10/29/2015, 12:13 PM

## 2015-10-30 MED ORDER — LORATADINE 10 MG PO TABS
10.0000 mg | ORAL_TABLET | Freq: Every day | ORAL | Status: DC
  Administered 2015-10-30: 10 mg via ORAL
  Filled 2015-10-30: qty 1

## 2015-10-30 MED ORDER — METOPROLOL TARTRATE 25 MG PO TABS: 25.0000 mg | ORAL_TABLET | Freq: Every day | ORAL | 0 refills | Status: AC

## 2015-10-30 MED ORDER — FUROSEMIDE 40 MG PO TABS: 20.0000 mg | ORAL_TABLET | Freq: Every day | ORAL | 0 refills | Status: DC

## 2015-10-30 NOTE — Discharge Summary (Signed)
Physician Discharge Summary  Holly Stephens [id number] DOB: 1955-07-14 DOA: 10/27/2015  PCP: [name], MD  Admit date: 10/27/2015 Discharge date: 10/30/2015  Admitted From: Home Disposition:  Home  Recommendations for Outpatient Follow-up:  1. Follow up with PCP in 1-2 weeks 2. Discuss cardiology follow up with PCP 3. Daily weights  Home Health:No  Equipment/Devices:Home oxygen- Nasal Cannula at 2L  Discharge Condition:Stable CODE STATUS:Full  Diet recommendation: Heart Healthy  Brief/Interim Summary: Holly Stephens Wilsonis an 60 y.o.femalewith hx of nodular slerosis [name], on her 2nd cycle of ABVD ()Dr Whitney Muse), hx of [name] on 2 drug regimen ( Dr Megan Salon), HTN, Obesity, DM, presented to the ER with SOB for the past several days. She denied CP, coughs, fever, chills, or pleuritic CP. She has not been on home oxygen, generally compliant with her meds, and has "good blood counts for her HIV". Evaluation in the ER showed CXR with vascular congestion, EKG Showed no acute ST T changes, and K of 3.7, normal Cr and no leukocytosis. She was given oxygen, along with IV Lasix, and hospitalist was asked to admit her for new onset of CHF.  Patient was seen and evaluated by Cardiology during admission and Echocardiogram was performed.  She was diuresed and lost approximately 5kg during admission.  She did require 2L [place] at time of discharge but patient voices significant improvement in symptoms of dyspnea.  Discharge Diagnoses:  Principal Problem:   Congestive heart failure with cardiomyopathy ([place]) Active Problems:   Human immunodeficiency virus (HIV) disease ([place])   OBESITY NOS   Essential hypertension   [name], nodular sclerosis (HCC)   Acute congestive heart failure [facility])    Discharge Instructions  Discharge Instructions    Call MD for:  difficulty breathing, headache or visual disturbances    Complete by:  As directed   Call MD for:  extreme fatigue     Complete by:  As directed   Call MD for:  persistant dizziness or light-headedness    Complete by:  As directed   Call MD for:  temperature >100.4    Complete by:  As directed   Diet - low sodium heart healthy    Complete by:  As directed   Increase activity slowly    Complete by:  As directed       Medication List    STOP taking these medications   naproxen sodium 220 MG tablet Commonly known as:  ANAPROX   ondansetron 8 MG tablet Commonly known as:  ZOFRAN   spironolactone 50 MG tablet Commonly known as:  ALDACTONE     TAKE these medications   acetaminophen 650 MG CR tablet Commonly known as:  TYLENOL Take 650 mg by mouth every 8 (eight) hours as needed for pain.   bleomycin in sodium chloride 0.9 % 50 mL Inject into the vein every 14 (fourteen) days.   cholecalciferol 1000 units tablet Commonly known as:  VITAMIN D Take 2,000 Units by mouth daily.   dacarbazine 200 MG chemo injection Commonly known as:  DTIC Inject into the vein every 14 (fourteen) days.   Darunavir Ethanolate 800 MG tablet Commonly known as:  [name] Take 1 tablet (800 mg total) by mouth daily with breakfast.   dexamethasone 4 MG tablet Commonly known as:  DECADRON Take 8 mg by mouth 2 (two) times daily with a meal. Pt is to take for three days after chemo.   DOXOrubicin 50 MG chemo injection Commonly known as:  ADRIAMYCIN  Inject 60 mg into the vein every 14 (fourteen) days.   elvitegravir-cobicistat-emtricitabine-tenofovir 150-150-200-10 MG Tabs tablet Commonly known as:  [name] Take 1 tablet by mouth daily with breakfast.   fenofibrate 160 MG tablet Take 160 mg by mouth daily.   furosemide 40 MG tablet Commonly known as:  LASIX Take 0.5 tablets (20 mg total) by mouth daily.   Garlic 123XX123 MG Caps Take 1,000 mg by mouth daily.   JANUMET 50-500 MG tablet Generic drug:  sitaGLIPtin-metformin Take 1 tablet by mouth 2 (two) times daily with a meal.   lidocaine-prilocaine  cream Commonly known as:  EMLA Apply 1 application topically as needed (prior to accessing port).   loratadine 10 MG tablet Commonly known as:  CLARITIN Take 10 mg by mouth See admin instructions. Pt only takes the day before chemo and five days after.   LORazepam 0.5 MG tablet Commonly known as:  ATIVAN Take 0.5 mg by mouth every 8 (eight) hours as needed for anxiety.   metoprolol tartrate 25 MG tablet Commonly known as:  LOPRESSOR Take 1 tablet (25 mg total) by mouth daily. What changed:  medication strength  how much to take   multivitamin with minerals Tabs tablet Take 1 tablet by mouth daily.   oxyCODONE-acetaminophen 5-325 MG tablet Commonly known as:  ROXICET Take 1 tablet by mouth every 4 (four) hours as needed for severe pain.   pantoprazole 40 MG tablet Commonly known as:  PROTONIX Take 1 tablet (40 mg total) by mouth daily.   PENNSAID 2 % Soln Generic drug:  Diclofenac Sodium Place 2 application onto the skin 2 (two) times daily as needed (for pain).   potassium chloride SA 20 MEQ tablet Commonly known as:  K-DUR,KLOR-CON Take 20 mEq by mouth 3 (three) times daily.   prochlorperazine 10 MG tablet Commonly known as:  COMPAZINE Take 1 tablet (10 mg total) by mouth every 6 (six) hours as needed for nausea or vomiting.   vinBLAStine in sodium chloride 0.9 % 50 mL Inject into the vein every 14 (fourteen) days.      Follow-up Information    [name], MD. Schedule an appointment as soon as possible for a visit in 1 week(s).   Specialty:  Internal Medicine Contact information: [place] [address] [phone]          Allergies  Allergen Reactions  . Augmentin [Amoxicillin-Pot Clavulanate] Hives and Other (See Comments)    Has patient had a PCN reaction causing immediate rash, facial/tongue/throat swelling, SOB or lightheadedness with hypotension: No Has patient had a PCN reaction causing severe rash involving mucus membranes  or skin necrosis: No Has patient had a PCN reaction that required hospitalization No Has patient had a PCN reaction occurring within the last 10 years: No If all of the above answers are "NO", then may proceed with Cephalosporin use.    Consultations:  Cardiology   Procedures/Studies: Dg Chest 2 View  Result Date: 10/27/2015 CLINICAL DATA:  Shortness of breath and nonproductive cough. EXAM: CHEST  2 VIEW COMPARISON:  10/26/2015 FINDINGS: There is a right chest wall port a catheter with tip in the projection of the cavoatrial junction. There is moderate cardiac enlargement. Aortic atherosclerosis noted Mild to moderate diffuse pulmonary edema is identified. No focal airspace consolidation identified. IMPRESSION: 1. Cardiac enlargement and pulmonary edema Electronically Signed   By: Kerby Moors M.D.   On: 10/27/2015 19:39   Dg Chest 2 View  Result Date: 10/26/2015 CLINICAL DATA:  Dry cough and sob x couple of days/no chest pain/non smoker/hodgkin lymphoma port placement 2017. EXAM: CHEST  2 VIEW COMPARISON:  CT 09/27/2015 FINDINGS: RIGHT-sided power port with tip in distal SVC. Normal cardiac silhouette. There is perihilar central venous congestion. Perihilar fullness consistent adenopathy similar to CT 09/27/2015. Small LEFT Pleural effusion. Degenerative osteophytosis of the thoracic spine. IMPRESSION: 1. Perihilar adenopathy and central venous congestion is similar to comparison exam. 2. Small LEFT effusion. Electronically Signed   By: Suzy Bouchard M.D.   On: 10/26/2015 10:58      Subjective: Patient reports she is feeling well this morning- better than she has in a while.  She mentions she feels ready to go home.  When soft blood pressures were discussed patient voices she does not feel dizzy, light headed, weak.  She denies any symptoms with blood pressure changes.  Discussed medication changes at time of discharge.  Patient voiced no questions.  She states she is eating well, and  breathing well.  She does have some cough occasionally and she states she normally takes allergy medication for that if the cough increases.  Denies fever.  Weight has decreased from 122kg to 117kg over admission.  Discharge Exam: Vitals:   10/30/15 1024 10/30/15 1336  BP: (!) 98/59 (!) 93/51  Pulse: 96 85  Resp:  20  Temp:  98.5 F (36.9 C)   Vitals:   10/30/15 0645 10/30/15 1024 10/30/15 1029 10/30/15 1336  BP: (!) 99/57 (!) 98/59  (!) 93/51  Pulse: 91 96  85  Resp: 20   20  Temp: 98.9 F (37.2 C)   98.5 F (36.9 C)  TempSrc: Oral   Oral  SpO2: 97%  90% 96%  Weight:      Height:        General: Pt is alert, awake, not in acute distress Cardiovascular: RRR, S1/S2 +, no rubs, no gallops Respiratory: CTA bilaterally, no wheezing, no rhonchi, normal work of breathing Abdominal: Soft, obese, NT, ND, bowel sounds + Extremities: no edema, no cyanosis    The results of significant diagnostics from this hospitalization (including imaging, microbiology, ancillary and laboratory) are listed below for reference.     Microbiology: No results found for this or any previous visit (from the past 240 hour(s)).   Labs: BNP (last 3 results)  Recent Labs  10/27/15 1835  BNP 99991111*   Basic Metabolic Panel:  Recent Labs Lab 10/26/15 1012 10/27/15 1835 10/28/15 0536 10/29/15 0603  NA 135 136 136 135  K 3.1* 3.7 3.4* 3.6  CL 100* 99* 98* 101  CO2 23 25 26 26   GLUCOSE 166* 112* 128* 134*  BUN 10 9 10 10   CREATININE 0.74 0.70 0.59 0.65  CALCIUM 9.1 9.4 9.1 8.6*  MG  --   --  1.5*  --    Liver Function Tests:  Recent Labs Lab 10/26/15 1012 10/28/15 0536  AST 23 19  ALT 17 15  ALKPHOS 33* 37*  BILITOT 0.5 0.4  PROT 6.3* 6.2*  ALBUMIN 2.9* 2.8*   No results for input(s): LIPASE, AMYLASE in the last 168 hours. No results for input(s): AMMONIA in the last 168 hours. CBC:  Recent Labs Lab 10/26/15 1012 10/27/15 1835  WBC 4.7 8.9  NEUTROABS 2.2 5.0  HGB 11.2*  11.3*  HCT 34.9* 35.0*  MCV 86.6 86.8  PLT 243 268   Cardiac Enzymes:  Recent Labs Lab 10/27/15 1835  TROPONINI 0.03*   BNP: Invalid input(s): POCBNP CBG: No results  for input(s): GLUCAP in the last 168 hours. D-Dimer No results for input(s): DDIMER in the last 72 hours. Hgb A1c No results for input(s): HGBA1C in the last 72 hours. Lipid Profile No results for input(s): CHOL, HDL, LDLCALC, TRIG, CHOLHDL, LDLDIRECT in the last 72 hours. Thyroid function studies  Recent Labs  10/27/15 1835  TSH 3.795   Anemia work up No results for input(s): VITAMINB12, FOLATE, FERRITIN, TIBC, IRON, RETICCTPCT in the last 72 hours. Urinalysis No results found for: COLORURINE, APPEARANCEUR, LABSPEC, [place], GLUCOSEU, HGBUR, BILIRUBINUR, KETONESUR, PROTEINUR, UROBILINOGEN, NITRITE, LEUKOCYTESUR Sepsis Labs Invalid input(s): PROCALCITONIN,  WBC,  LACTICIDVEN Microbiology No results found for this or any previous visit (from the past 240 hour(s)).   Time coordinating discharge: Over 30 minutes  SIGNED:   Newman Pies, MD  [facility] 10/30/2015, 2:08 PM Pager [phone] If 7PM-7AM, please contact night-coverage [email or web address] Password [email or web address]

## 2015-10-30 NOTE — Discharge Instructions (Signed)
Heart Failure  Heart failure means your heart has trouble pumping blood. This makes it hard for your body to work well. Heart failure is usually a long-term (chronic) condition. You must take good care of yourself and follow your doctor's treatment plan.  HOME CARE   Take your heart medicine as told by your doctor.    Do not stop taking medicine unless your doctor tells you to.    Do not skip any dose of medicine.    Refill your medicines before they run out.    Take other medicines only as told by your doctor or pharmacist.   Stay active if told by your doctor. The elderly and people with severe heart failure should talk with a doctor about physical activity.   Eat heart-healthy foods. Choose foods that are without trans fat and are low in saturated fat, cholesterol, and salt (sodium). This includes fresh or frozen fruits and vegetables, fish, lean meats, fat-free or low-fat dairy foods, whole grains, and high-fiber foods. Lentils and dried peas and beans (legumes) are also good choices.   Limit salt if told by your doctor.   Cook in a healthy way. Roast, grill, broil, bake, poach, steam, or stir-fry foods.   Limit fluids as told by your doctor.   Weigh yourself every morning. Do this after you pee (urinate) and before you eat breakfast. Write down your weight to give to your doctor.   Take your blood pressure and write it down if your doctor tells you to.   Ask your doctor how to check your pulse. Check your pulse as told.   Lose weight if told by your doctor.   Stop smoking or chewing tobacco. Do not use gum or patches that help you quit without your doctor's approval.   Schedule and go to doctor visits as told.   Nonpregnant women should have no more than 1 drink a day. Men should have no more than 2 drinks a day. Talk to your doctor about drinking alcohol.   Stop illegal drug use.   Stay current with shots (immunizations).   Manage your health conditions as told by your doctor.   Learn to  manage your stress.   Rest when you are tired.   If it is really hot outside:    Avoid intense activities.    Use air conditioning or fans, or get in a cooler place.    Avoid caffeine and alcohol.    Wear loose-fitting, lightweight, and light-colored clothing.   If it is really cold outside:    Avoid intense activities.    Layer your clothing.    Wear mittens or gloves, a hat, and a scarf when going outside.    Avoid alcohol.   Learn about heart failure and get support as needed.   Get help to maintain or improve your quality of life and your ability to care for yourself as needed.  GET HELP IF:    You gain weight quickly.   You are more short of breath than usual.   You cannot do your normal activities.   You tire easily.   You cough more than normal, especially with activity.   You have any or more puffiness (swelling) in areas such as your hands, feet, ankles, or belly (abdomen).   You cannot sleep because it is hard to breathe.   You feel like your heart is beating fast (palpitations).   You get dizzy or light-headed when you stand up.  GET HELP   RIGHT AWAY IF:    You have trouble breathing.   There is a change in mental status, such as becoming less alert or not being able to focus.   You have chest pain or discomfort.   You faint.  MAKE SURE YOU:    Understand these instructions.   Will watch your condition.   Will get help right away if you are not doing well or get worse.     This information is not intended to replace advice given to you by your health care provider. Make sure you discuss any questions you have with your health care provider.     Document Released: 11/16/2007 Document Revised: 02/27/2014 Document Reviewed: 03/25/2012  Elsevier Interactive Patient Education 2016 Elsevier Inc.

## 2015-10-30 NOTE — Progress Notes (Signed)
Patient being d/c home with Home oxygen. IV cath removed and intact. No c/o pain at this time or at site. Waiting for [facility] to deliver o2 tanks prior to discharge. Family waiting at bedside.

## 2015-10-30 NOTE — Progress Notes (Signed)
SATURATION QUALIFICATIONS: (This note is used to comply with regulatory documentation for home oxygen)  Patient Saturations on Room Air at Rest = 90%  Patient Saturations on Room Air while Ambulating = 85%  Patient Saturations on 2 Liters of oxygen while Ambulating = 91%  Please briefly explain why patient needs home oxygen: Patient needs oxygen due to the fact that when she ambulates her oxygen saturation dropped down to 85% and she gets SOB on exertion.

## 2015-10-30 NOTE — Progress Notes (Addendum)
CM received information and faxed to [facility] for review for home oxygen setup.  Followed up with representative and will be having oxygen delivery to hospital for d/c home.

## 2015-11-01 ENCOUNTER — Ambulatory Visit (HOSPITAL_COMMUNITY)
Admission: RE | Admit: 2015-11-01 | Discharge: 2015-11-01 | Disposition: A | Discharge status: Home / Self Care | Payer: 59 | Source: Ambulatory Visit | Attending: Oncology | Admitting: Oncology

## 2015-11-01 DIAGNOSIS — C8114 Nodular sclerosis classical Hodgkin lymphoma, lymph nodes of axilla and upper limb: Secondary | ICD-10-CM | POA: Diagnosis not present

## 2015-11-01 DIAGNOSIS — K439 Ventral hernia without obstruction or gangrene: Secondary | ICD-10-CM | POA: Insufficient documentation

## 2015-11-01 LAB — GLUCOSE, CAPILLARY: GLUCOSE-CAPILLARY: 128 mg/dL — AB (ref 65–99)

## 2015-11-01 MED ORDER — FLUDEOXYGLUCOSE F - 18 (FDG) INJECTION
12.9000 | Freq: Once | INTRAVENOUS | Status: AC | PRN
  Administered 2015-11-01: 12.9 via INTRAVENOUS

## 2015-11-03 ENCOUNTER — Ambulatory Visit (HOSPITAL_COMMUNITY): Payer: 59

## 2015-11-03 ENCOUNTER — Encounter (HOSPITAL_BASED_OUTPATIENT_CLINIC_OR_DEPARTMENT_OTHER): Payer: 59

## 2015-11-03 VITALS — BP 122/60 | HR 87 | Temp 97.5°F | Resp 18 | Wt 263.8 lb

## 2015-11-03 DIAGNOSIS — Z5111 Encounter for antineoplastic chemotherapy: Secondary | ICD-10-CM

## 2015-11-03 DIAGNOSIS — C8118 Nodular sclerosis classical Hodgkin lymphoma, lymph nodes of multiple sites: Secondary | ICD-10-CM

## 2015-11-03 DIAGNOSIS — C8114 Nodular sclerosis classical Hodgkin lymphoma, lymph nodes of axilla and upper limb: Secondary | ICD-10-CM

## 2015-11-03 LAB — COMPREHENSIVE METABOLIC PANEL
ALBUMIN: 3 g/dL — AB (ref 3.5–5.0)
ALK PHOS: 36 U/L — AB (ref 38–126)
ALT: 32 U/L (ref 14–54)
AST: 54 U/L — ABNORMAL HIGH (ref 15–41)
Anion gap: 11 (ref 5–15)
BUN: 11 mg/dL (ref 6–20)
CALCIUM: 9.8 mg/dL (ref 8.9–10.3)
CO2: 27 mmol/L (ref 22–32)
CREATININE: 0.74 mg/dL (ref 0.44–1.00)
Chloride: 102 mmol/L (ref 101–111)
GFR calc Af Amer: >60 mL/min (ref >60)
GFR calc non Af Amer: >60 mL/min (ref >60)
GLUCOSE: 104 mg/dL — AB (ref 65–99)
Potassium: 3.8 mmol/L (ref 3.5–5.1)
SODIUM: 140 mmol/L (ref 135–145)
Total Bilirubin: 0.4 mg/dL (ref 0.3–1.2)
Total Protein: 6.5 g/dL (ref 6.5–8.1)

## 2015-11-03 LAB — CBC WITH DIFFERENTIAL/PLATELET
BASOS PCT: 1 %
Basophils Absolute: 0.1 10*3/uL (ref 0.0–0.1)
EOS ABS: 0.1 10*3/uL (ref 0.0–0.7)
Eosinophils Relative: 1 %
HCT: 35.6 % — ABNORMAL LOW (ref 36.0–46.0)
HEMOGLOBIN: 11.2 g/dL — AB (ref 12.0–15.0)
LYMPHS ABS: 2.1 10*3/uL (ref 0.7–4.0)
Lymphocytes Relative: 24 %
MCH: 28 pg (ref 26.0–34.0)
MCHC: 31.5 g/dL (ref 30.0–36.0)
MCV: 89 fL (ref 78.0–100.0)
Monocytes Absolute: 0.7 10*3/uL (ref 0.1–1.0)
Monocytes Relative: 8 %
NEUTROS PCT: 67 %
Neutro Abs: 6 10*3/uL (ref 1.7–7.7)
Platelets: 331 10*3/uL (ref 150–400)
RBC: 4 MIL/uL (ref 3.87–5.11)
RDW: 15.6 % — ABNORMAL HIGH (ref 11.5–15.5)
WBC: 9.3 10*3/uL (ref 4.0–10.5)

## 2015-11-03 MED ORDER — SODIUM CHLORIDE 0.9 % IV SOLN
Freq: Once | INTRAVENOUS | Status: AC
  Administered 2015-11-03: 14:00:00 via INTRAVENOUS

## 2015-11-03 MED ORDER — SODIUM CHLORIDE 0.9 % IV SOLN
Freq: Once | INTRAVENOUS | Status: AC
  Administered 2015-11-03: 14:00:00 via INTRAVENOUS
  Filled 2015-11-03: qty 5

## 2015-11-03 MED ORDER — DOXORUBICIN HCL CHEMO IV INJECTION 2 MG/ML
25.0000 mg/m2 | Freq: Once | INTRAVENOUS | Status: AC
  Administered 2015-11-03: 60 mg via INTRAVENOUS
  Filled 2015-11-03: qty 30

## 2015-11-03 MED ORDER — VINBLASTINE SULFATE CHEMO INJECTION 1 MG/ML
13.8000 mg | Freq: Once | INTRAVENOUS | Status: AC
  Administered 2015-11-03: 13.8 mg via INTRAVENOUS
  Filled 2015-11-03: qty 13.8

## 2015-11-03 MED ORDER — HEPARIN SOD (PORK) LOCK FLUSH 100 UNIT/ML IV SOLN
500.0000 [IU] | Freq: Once | INTRAVENOUS | Status: AC | PRN
  Administered 2015-11-03: 500 [IU]
  Filled 2015-11-03: qty 5

## 2015-11-03 MED ORDER — SODIUM CHLORIDE 0.9 % IV SOLN
860.0000 mg | Freq: Once | INTRAVENOUS | Status: AC
  Administered 2015-11-03: 860 mg via INTRAVENOUS
  Filled 2015-11-03: qty 43

## 2015-11-03 MED ORDER — PALONOSETRON HCL INJECTION 0.25 MG/5ML
0.2500 mg | Freq: Once | INTRAVENOUS | Status: AC
  Administered 2015-11-03: 0.25 mg via INTRAVENOUS
  Filled 2015-11-03: qty 5

## 2015-11-03 MED ORDER — SODIUM CHLORIDE 0.9% FLUSH
10.0000 mL | INTRAVENOUS | Status: DC | PRN
  Administered 2015-11-03: 10 mL
  Filled 2015-11-03: qty 10

## 2015-11-03 NOTE — Patient Instructions (Addendum)
[facility] Discharge Instructions for Patients Receiving Chemotherapy   Beginning January 23rd 2017 lab work for the [facility] will be done in the  Main lab at [facility] on 1st floor. If you have a lab appointment with the [facility] please come in thru the  Main Entrance and check in at the main information desk   Today you received the following chemotherapy agents Adriamycin,Vincristine and Cytoxan. Follow-up as scheduled. Call clinic for any questions or concerns  To help prevent nausea and vomiting after your treatment, we encourage you to take your nausea medication   If you develop nausea and vomiting, or diarrhea that is not controlled by your medication, call the clinic.  The clinic phone number is [phone]. Office hours are Monday-Friday 8:30am-5:00pm.  BELOW ARE SYMPTOMS THAT SHOULD BE REPORTED IMMEDIATELY:  *FEVER GREATER THAN 101.0 F  *CHILLS WITH OR WITHOUT FEVER  NAUSEA AND VOMITING THAT IS NOT CONTROLLED WITH YOUR NAUSEA MEDICATION  *UNUSUAL SHORTNESS OF BREATH  *UNUSUAL BRUISING OR BLEEDING  TENDERNESS IN MOUTH AND THROAT WITH OR WITHOUT PRESENCE OF ULCERS  *URINARY PROBLEMS  *BOWEL PROBLEMS  UNUSUAL RASH Items with * indicate a potential emergency and should be followed up as soon as possible. If you have an emergency after office hours please contact your primary care physician or go to the nearest emergency department.  Please call the clinic during office hours if you have any questions or concerns.   You may also contact the Patient Navigator at [phone] should you have any questions or need assistance in obtaining follow up care.      Resources For Cancer Patients and their Caregivers ? American Cancer Society: Can assist with transportation, wigs, general needs, runs Look Good Feel Better.        713-744-3494 ? Cancer Care: Provides financial assistance, online support groups, medication/co-pay  assistance.  1-800-813-HOPE 475-746-9294) ? [place] Assists [place] cancer patients and their families through emotional , educational and financial support.  219-469-6051 ? [place] DSS Where to apply for food stamps, Medicaid and utility assistance. 331-871-2593 ? [employer]: Transportation to medical appointments. (224) 834-6478 ? Social Security Administration: May apply for disability if have a Stage IV cancer. 423 506 9874 253-787-2605 ? [employer], Disability and Transit Services: Assists with nutrition, care and transit needs. [facility] Discharge Instructions for Patients Receiving Chemotherapy   Beginning January 23rd 2017 lab work for the [facility] will be done in the  Main lab at [facility] on 1st floor. If you have a lab appointment with the [facility] please come in thru the  Main Entrance and check in at the main information desk   Today you received the following chemotherapy agents Adriamycin,Vinblastine and DTIC. Follow-up as scheduled. Call clinic for any questions or concerns  To help prevent nausea and vomiting after your treatment, we encourage you to take your nausea medication   If you develop nausea and vomiting, or diarrhea that is not controlled by your medication, call the clinic.  The clinic phone number is [phone]. Office hours are Monday-Friday 8:30am-5:00pm.  BELOW ARE SYMPTOMS THAT SHOULD BE REPORTED IMMEDIATELY:  *FEVER GREATER THAN 101.0 F  *CHILLS WITH OR WITHOUT FEVER  NAUSEA AND VOMITING THAT IS NOT CONTROLLED WITH YOUR NAUSEA MEDICATION  *UNUSUAL SHORTNESS OF BREATH  *UNUSUAL BRUISING OR BLEEDING  TENDERNESS IN MOUTH AND THROAT WITH OR WITHOUT PRESENCE OF  ULCERS  *URINARY PROBLEMS  *BOWEL PROBLEMS  UNUSUAL RASH Items with * indicate a potential emergency and should be followed up as soon as possible. If you have an emergency after office  hours please contact your primary care physician or go to the nearest emergency department.  Please call the clinic during office hours if you have any questions or concerns.   You may also contact the Patient Navigator at [phone] should you have any questions or need assistance in obtaining follow up care.      Resources For Cancer Patients and their Caregivers ? American Cancer Society: Can assist with transportation, wigs, general needs, runs Look Good Feel Better.        (713) 532-9370 ? Cancer Care: Provides financial assistance, online support groups, medication/co-pay assistance.  1-800-813-HOPE 918-476-6797) ? [place] Assists [place] cancer patients and their families through emotional , educational and financial support.  657-108-1566 ? [place] DSS Where to apply for food stamps, Medicaid and utility assistance. 609-668-1781 ? [employer]: Transportation to medical appointments. (628)155-2161 ? Social Security Administration: May apply for disability if have a Stage IV cancer. 336-806-5186 (640)137-5957 ? [employer], Disability and Transit Services: Assists with nutrition, care and transit needs. 639-463-0334

## 2015-11-03 NOTE — Progress Notes (Signed)
Pt tolerated chemo tx well without incident. Lab results and pt's recent hospitalization for CHF reported to Dr. Whitney Muse. Orders obtained to proceed with chemo tx but hold the Bleomycin today per MD. VSS upon discharge. Pt discharged via wheelchair with 02 continued at 2L per N/C from home. Pt in stable condition upon discharge accompanied by friend. Will wait to give Influenza vaccine until next treatment per [name] PA

## 2015-11-04 ENCOUNTER — Other Ambulatory Visit (HOSPITAL_COMMUNITY): Payer: Self-pay | Admitting: Oncology

## 2015-11-04 ENCOUNTER — Telehealth (HOSPITAL_COMMUNITY): Payer: Self-pay | Admitting: Emergency Medicine

## 2015-11-04 NOTE — Telephone Encounter (Signed)
Called to let pt know that we had sent in a cardiology consult to Dr Arman Filter.  They would be in contact with her with this appt, she verbalized understanding.

## 2015-11-08 ENCOUNTER — Ambulatory Visit (HOSPITAL_COMMUNITY): Payer: 59 | Admitting: Hematology & Oncology

## 2015-11-08 ENCOUNTER — Ambulatory Visit (HOSPITAL_COMMUNITY): Payer: 59

## 2015-11-10 ENCOUNTER — Other Ambulatory Visit (HOSPITAL_COMMUNITY): Payer: Self-pay | Admitting: Pharmacist

## 2015-11-12 ENCOUNTER — Encounter (HOSPITAL_COMMUNITY): Payer: Self-pay | Admitting: Hematology & Oncology

## 2015-11-12 ENCOUNTER — Encounter (HOSPITAL_BASED_OUTPATIENT_CLINIC_OR_DEPARTMENT_OTHER): Payer: 59 | Admitting: Hematology & Oncology

## 2015-11-12 VITALS — BP 116/83 | HR 83 | Temp 98.1°F | Wt 254.4 lb

## 2015-11-12 DIAGNOSIS — I5031 Acute diastolic (congestive) heart failure: Secondary | ICD-10-CM

## 2015-11-12 DIAGNOSIS — R0602 Shortness of breath: Secondary | ICD-10-CM | POA: Diagnosis not present

## 2015-11-12 DIAGNOSIS — B20 Human immunodeficiency virus [HIV] disease: Secondary | ICD-10-CM | POA: Diagnosis not present

## 2015-11-12 DIAGNOSIS — C8114 Nodular sclerosis classical Hodgkin lymphoma, lymph nodes of axilla and upper limb: Secondary | ICD-10-CM | POA: Diagnosis not present

## 2015-11-12 NOTE — Patient Instructions (Signed)
[facility] Discharge Instructions  RECOMMENDATIONS MADE BY THE CONSULTANT AND ANY TEST RESULTS WILL BE SENT TO YOUR REFERRING PHYSICIAN.    Thank you for choosing [facility] at [facility] to provide your oncology and hematology care.  To afford each patient quality time with our provider, please arrive at least 15 minutes before your scheduled appointment time.   Beginning January 23rd 2017 lab work for the Cancer Center will be done in the  Main lab at [facility] on 1st floor. If you have a lab appointment with the Cancer Center please come in thru the  Main Entrance and check in at the main information desk  You need to re-schedule your appointment should you arrive 10 or more minutes late.  We strive to give you quality time with our providers, and arriving late affects you and other patients whose appointments are after yours.  Also, if you no show three or more times for appointments you may be dismissed from the clinic at the providers discretion.     Again, thank you for choosing [facility].  Our hope is that these requests will decrease the amount of time that you wait before being seen by our physicians.       _____________________________________________________________  Should you have questions after your visit to [facility], please contact our office at [phone] between the hours of 8:30 a.m. and 4:30 p.m.  Voicemails left after 4:30 p.m. will not be returned until the following business day.  For prescription refill requests, have your pharmacy contact our office.         Resources For Cancer Patients and their Caregivers ? American Cancer Society: Can assist with transportation, wigs, general needs, runs Look Good Feel Better.        1-888-227-6333 ? Cancer Care: Provides financial assistance, online support groups, medication/co-pay assistance.  1-800-813-HOPE (4673) ? [facility] Assists [place] cancer patients and their families through emotional , educational and financial support.  336-427-4357 ? [place] DSS Where to apply for food stamps, Medicaid and utility assistance. 336-342-1394 ? RCATS: Transportation to medical appointments. 336-347-2287 ? Social Security Administration: May apply for disability if have a Stage IV cancer. 336-342-7796 1-800-772-1213 ? [place] [employer], Disability and Transit Services: Assists with nutrition, care and transit needs. 336-349-2343  Cancer Center Support Programs: @10RELATIVEDAYS@ > Cancer Support Group  2nd Tuesday of the month 1pm-2pm, Journey Room  > Creative Journey  3rd Tuesday of the month 1130am-1pm, Journey Room  > Look Good Feel Better  1st Wednesday of the month 10am-12 noon, Journey Room (Call American Cancer Society to register 1-800-395-5775)   

## 2015-11-12 NOTE — Progress Notes (Signed)
Holly Neighbors, MD [place] [address]    [name], nodular sclerosis ([place])   07/23/2015 Procedure    Lext axilla lymph node biopsy      07/27/2015 Pathology Results    Classical [name], nodular sclerosis type.  Positive CD30, CD15, andPAX-5.  Negative CD45 and CD20.      08/31/2015 PET scan    Hypermetabolic lymphadenopathy. Nodal stations include: L supraclavicular nodes, L axillary nodes, mediastinal lymph nodes, periaortic retroperitoneal and periportal lymph nodes as well as L iliac and inguinal lymph nodes. Two concerning pulm nodules...      09/01/2015 Imaging    MUGA- Normal LEFT ventricular ejection fraction of 73%.  Normal LEFT ventricular wall motion.      09/01/2015 Procedure    PFTs-  Spirometry shows mild ventilatory defect without airflow obstruction and no bronchodilator improvement. Lung volumes are normal. Airway resistance is normal. DLCO normal. Essentially normal lung function      09/08/2015 Procedure    Port placed by Dr. Rosana Hoes      09/13/2015 -  Chemotherapy    ABVD      09/27/2015 Imaging    CT angio of chest- no gross PE seen. Improving axillary, mediastinal and upper abdominal adenopathy. Improved and possibly resolved RUL nodule and with resolved or obscured LLL nodule. Small L pleural effusion and mild L lower lobe atelectasis.      11/01/2015 PET scan    Clear interval response to therapy with decrease in size and hypermetabolism of larger lymph nodes seen previously and some of the smaller hypermetabolic lymph node seen on the previous study have resolved completely in terms of abnormal enlargement and hypermetabolism. 2. Slight decrease in left lower lobe pulmonary nodule with interval resolution of the right upper lobe pulmonary nodule. 3. Stable appearance of mild heterogeneous FDG accumulation within the marrow space.       CURRENT THERAPY: ABVD  INTERVAL HISTORY: Holly Stephens 60 y.o. female  returns for followup of Hodgkin's Lymphoma, nodular sclerosing-type, classical Hodgkin's Disease with lymphatic involvement in multiple lymph nodes superior and inferior to the diaphragm with PET imaging demonstrating findings concerning for pulmonary involvement (Stage III versus Stage IV disease ?pulmonary nodules x 2?).  Holly Stephens is accompanied by friends. She has O2 via nasal cannula. She no longer feels she needs O2. She has an upcoming appointment with Dr. Haroldine Laws."I am breathing much better now". She denies feeling short of breath while getting onto the examination table. She is curious if she should stay on oxygen. She no longer takes it with her while walking to the mailbox. She is able to keep her oxygen about 90%.   Since our last visit, the patient was hospitalized 10/27/2015 to 10/30/2015 with acute congestive heart failure.   She has been eating better and eats 3 meals daily. She attributes her recent weight loss to fluid loss and is taking a fluid pill. She is tolerating Lasix well, other than it makes her urinate a lot.   She has been staying positive and has been working on becoming more active.  Today is the last day of her short term disability. She hopes to have this extended. Our clinic may receive a call regarding this.  No nausea or vomiting. No CP.  Review of Systems  Constitutional: Positive for malaise/fatigue. Negative for chills, fever and weight loss.       Severe fatigue  HENT: Negative.   Eyes: Negative.  Respiratory: Positive for shortness of breath (improved).        Shortness of breath with exertion  Cardiovascular: Negative for chest pain.  Gastrointestinal: Negative for nausea and vomiting.  Genitourinary: Negative.  Negative for dysuria.  Musculoskeletal: Negative.   Skin: Negative.   Neurological: Positive for weakness.  Endo/Heme/Allergies: Negative.   Psychiatric/Behavioral: Negative.   14 point review of systems was performed and is negative  except as detailed under history of present illness and above   Past Medical History:  Diagnosis Date  . Arthritis    osteoarthritis  . Diabetes mellitus without complication ([place])   . HIV infection ([place])   . [name], nodular sclerosis ([place]) 08/23/2015  . Human immunodeficiency virus (HIV) disease ([place]) 02/23/2006   Qualifier: Diagnosis of  By: Megan Salon MD, John      Past Surgical History:  Procedure Laterality Date  . AXILLARY LYMPH NODE BIOPSY Left 07/23/2015   Procedure: EXCISIONAL BIOPSY LEFT AXILLARY LYMPH NODE;  Surgeon: [name], MD;  Location: [facility];  Service: General;  Laterality: Left;  . PORTACATH PLACEMENT Right 09/08/2015   Procedure: INSERTION OF CENTRAL VENOUS CATHETER WITH PORT FOR CHEMOTHERAPY;  Surgeon: [name], MD;  Location: [facility];  Service: General;  Laterality: Right;  . SHOULDER SURGERY Right    removal of bone spur    Family History  Problem Relation Age of Onset  . Hypertension Mother   . Cancer Mother 31    breast   . Hypertension Father   . Heart attack Father   . Pulmonary embolism Sister   . Parkinson's disease Brother     Social History   Social History  . Marital status: Widowed    Spouse name: N/A  . Number of children: N/A  . Years of education: N/A   Social History Main Topics  . Smoking status: Never Smoker  . Smokeless tobacco: Never Used  . Alcohol use No  . Drug use: No  . Sexual activity: No     Comment: declined condoms   Other Topics Concern  . None   Social History Narrative  . None     PHYSICAL EXAMINATION  ECOG PERFORMANCE STATUS: 1 - Symptomatic but completely ambulatory  Vitals:   11/12/15 1100  BP: 116/83  Pulse: 83  Temp: 98.1 F (36.7 C)    GENERAL:alert, well nourished, well developed,  cooperative, obese. O2 by nasal cannula.  SKIN: skin color, texture, turgor are normal, no rashes or significant lesions HEAD: Normocephalic, No masses, lesions, tenderness or  abnormalities EYES: normal, EOMI, Conjunctiva are pink and non-injected EARS: External ears normal OROPHARYNX:lips, buccal mucosa, and tongue normal and mucous membranes are moist  NECK: supple, trachea midline LYMPH:  not examined BREAST:not examined LUNGS: clear to auscultation, no wheezing, no rhonchi, no crackles HEART: regular rate & rhythm ABDOMEN:obese BACK: Back symmetric, no curvature. EXTREMITIES:No significant LE edema, less then 2 second capillary refill, no joint deformities, effusion, or inflammation, no skin discoloration, no clubbing, no cyanosis NEURO: alert & oriented x 3 with fluent speech, no focal motor/sensory deficits, gait normal   LABORATORY DATA: I have reviewed the data as listed. CBC    Component Value Date/Time   WBC 9.3 11/03/2015 1152   RBC 4.00 11/03/2015 1152   HGB 11.2 (L) 11/03/2015 1152   HCT 35.6 (L) 11/03/2015 1152   PLT 331 11/03/2015 1152   MCV 89.0 11/03/2015 1152   MCH 28.0 11/03/2015 1152   MCHC 31.5 11/03/2015 1152  RDW 15.6 (H) 11/03/2015 1152   LYMPHSABS 2.1 11/03/2015 1152   MONOABS 0.7 11/03/2015 1152   EOSABS 0.1 11/03/2015 1152   BASOSABS 0.1 11/03/2015 1152      Chemistry      Component Value Date/Time   NA 140 11/03/2015 1152   K 3.8 11/03/2015 1152   CL 102 11/03/2015 1152   CO2 27 11/03/2015 1152   BUN 11 11/03/2015 1152   CREATININE 0.74 11/03/2015 1152   CREATININE 0.52 06/01/2015 0900      Component Value Date/Time   CALCIUM 9.8 11/03/2015 1152   ALKPHOS 36 (L) 11/03/2015 1152   AST 54 (H) 11/03/2015 1152   ALT 32 11/03/2015 1152   BILITOT 0.4 11/03/2015 1152     Results for ALIHA, VANVACTOR (MRN [id number]) as of 11/12/2015 12:42  Ref. Range 10/26/2015 10:12 10/27/2015 18:35 10/28/2015 05:36 10/29/2015 06:03 11/03/2015 11:52  Potassium Latest Ref Range: 3.5 - 5.1 mmol/L 3.1 (L) 3.7 3.4 (L) 3.6 3.8    RADIOGRAPHIC STUDIES: I have personally reviewed the radiological images as listed and agreed with the  findings in the report.  Dg Chest 2 View  Result Date: 10/27/2015 CLINICAL DATA:  Shortness of breath and nonproductive cough. EXAM: CHEST  2 VIEW COMPARISON:  10/26/2015 FINDINGS: There is a right chest wall port a catheter with tip in the projection of the cavoatrial junction. There is moderate cardiac enlargement. Aortic atherosclerosis noted Mild to moderate diffuse pulmonary edema is identified. No focal airspace consolidation identified. IMPRESSION: 1. Cardiac enlargement and pulmonary edema Electronically Signed   By: Kerby Moors M.D.   On: 10/27/2015 19:39   Dg Chest 2 View  Result Date: 10/26/2015 CLINICAL DATA:  Dry cough and sob x couple of days/no chest pain/non smoker/hodgkin lymphoma port placement 2017. EXAM: CHEST  2 VIEW COMPARISON:  CT 09/27/2015 FINDINGS: RIGHT-sided power port with tip in distal SVC. Normal cardiac silhouette. There is perihilar central venous congestion. Perihilar fullness consistent adenopathy similar to CT 09/27/2015. Small LEFT Pleural effusion. Degenerative osteophytosis of the thoracic spine. IMPRESSION: 1. Perihilar adenopathy and central venous congestion is similar to comparison exam. 2. Small LEFT effusion. Electronically Signed   By: Suzy Bouchard M.D.   On: 10/26/2015 10:58   Nm Pet Image Restag (ps) Skull Base To Thigh  Addendum Date: 11/01/2015   ADDENDUM REPORT: 11/01/2015 16:24 ADDENDUM: Deauville Scores: 1. Index left axillary lymph node = 3 2. Index right axillary lymph nodes = 2 3. Left inferior hilar lymph node = 3 4. Left internal mammary lymph node = 1 5. Index periportal lymph node = 2 6. Index left inguinal lymph node = 2 Electronically Signed   By: Misty Stanley M.D.   On: 11/01/2015 16:24   Result Date: 11/01/2015 CLINICAL DATA:  Subsequent treatment strategy for [name]. EXAM: NUCLEAR MEDICINE PET SKULL BASE TO THIGH TECHNIQUE: 12.9 mCi F-18 FDG was injected intravenously. Full-ring PET imaging was performed from the skull  base to thigh after the radiotracer. CT data was obtained and used for attenuation correction and anatomic localization. FASTING BLOOD GLUCOSE:  Value: 128 mg/dl COMPARISON:  08/31/2015 FINDINGS: NECK Increased activity in the vocal cords is symmetric and likely related to patient speaking after contrast injection. CHEST Marked interval improvement and hypermetabolic lymphadenopathy seen previously. Bulky left axillary lymphadenopathy which was 2.8 cm in short axis on the previous study is now 2.3 cm. SUV max = 3.7 today compared to 12.2 previously. The hypermetabolic smaller right axillary lymph nodes have resolved  in the interval. Previously, there is was a a hypermetabolic 1.2 cm short axis right axillary lymph node. On the current study, this same lymph node measures 4 mm in shows no hypermetabolism on PET images. Hypermetabolic mediastinal and hilar lymphadenopathy has also decreased substantially in the interval. 12 mm short axis subcarinal lymph node with SUV max = 11.8 previously has decreased to 5 mm short axis today with no evidence for hypermetabolic uptake above background mediastinal levels. The inferior left hilar metabolic lymphadenopathy upper metabolic lymphadenopathy seen previously has decreased in the interval. When I remeasure on the prior study SUV max = 6.2 compared to 4.3 today. Previously described left internal mammary hypermetabolic lymph node measuring 12 mm on the prior study has decreased to 4 mm today and shows no hypermetabolic FDG uptake. Previously described left lower lobe nodule is 6 mm today compared to 7 mm previously (see image 40 series a on today's exam). The right upper lobe nodule seen previously is not evident today. There is bilateral subsegmental atelectasis patchy ground-glass attenuation, likely related to atelectasis on this free breathing study. ABDOMEN/PELVIS The bulky retroperitoneal lymphadenopathy has improved dramatically in the interval. 19 mm periportal lymph  node has decreased to 9 mm short axis in the interval with SUV max = 3.0 today compared to 12.2 previously. Previously described retroperitoneal and pelvic sidewall hypermetabolic lymphadenopathy is also decreased. Index left inguinal lymph node which was 2 cm short axis previously with SUV max = 18.5, now measures 7 mm short axis with FDG uptake at background soft tissue levels. Midline ventral hernia again noted, containing only fat. SKELETON Scattered patchy FDG uptake within the marrow space is stable in the interval. IMPRESSION: 1. Clear interval response to therapy with decrease in size and hypermetabolism of larger lymph nodes seen previously and some of the smaller hypermetabolic lymph node seen on the previous study have resolved completely in terms of abnormal enlargement and hypermetabolism. 2. Slight decrease in left lower lobe pulmonary nodule with interval resolution of the right upper lobe pulmonary nodule. 3. Stable appearance of mild heterogeneous FDG accumulation within the marrow space. 4. Midline ventral hernia is stable, contains only fat. Electronically Signed: By: Misty Stanley M.D. On: 11/01/2015 11:18    PATHOLOGY: Diagnosis Lymph node for lymphoma, left axillary - CLASSICAL [name], NODULAR SCLEROSIS TYPE.    ASSESSMENT AND PLAN:  [name], nodular sclerosis (HCC) Stage IIIA  Obesity HIV Altered tasted secondary to chemotherapy Chemotherapy related fatigue Diarrhea Shortness of breath Weight loss Hypokalemia CHF O2 dependence Diastolic heart failure  We reviewed her recent hospital admission. She is scheduled to meet with Dr. Haroldine Laws on 12/06/15.  I have discontinued ongoing bleomycin. We reviewed her PET, Deauvill 3, she will therefore need AVD X 4 additional cycles. We reviewed this today in detail. She is agreeable.   Weight loss of 9 lbs since 11/03/15. Patient attributes this to fluid loss. She will continue taking Lasix and potassium. Will  monitor fluid administration with chemotherapy.   She does not need any refills at this time.   We will walk her at follow-up without O2. If she maintains her saturations we can discontinue O2 and see how she does.   She will return in one week for therapy. She will need the flu vaccination and will plan on this at follow-up as well.  All questions were answered. The patient knows to call the clinic with any problems, questions or concerns. We can certainly see the patient much sooner if  necessary.  This document serves as a record of services personally performed by Ancil Linsey, MD. It was created on her behalf by Arlyce Harman, a trained medical scribe. The creation of this record is based on the scribe's personal observations and the provider's statements to them. This document has been checked and approved by the attending provider.  I have reviewed the above documentation for accuracy and completeness, and I agree with the above.  This note is electronically signed [id number] Cyril Mourning, MD  11/12/2015 11:53 AM

## 2015-11-17 ENCOUNTER — Encounter (HOSPITAL_BASED_OUTPATIENT_CLINIC_OR_DEPARTMENT_OTHER): Payer: 59

## 2015-11-17 ENCOUNTER — Encounter (HOSPITAL_COMMUNITY): Payer: Self-pay | Admitting: Hematology & Oncology

## 2015-11-17 ENCOUNTER — Ambulatory Visit (HOSPITAL_COMMUNITY): Payer: 59

## 2015-11-17 ENCOUNTER — Ambulatory Visit (HOSPITAL_COMMUNITY): Payer: 59 | Admitting: Oncology

## 2015-11-17 VITALS — BP 118/62 | HR 88 | Temp 97.8°F | Resp 20 | Wt 263.5 lb

## 2015-11-17 DIAGNOSIS — Z5111 Encounter for antineoplastic chemotherapy: Secondary | ICD-10-CM

## 2015-11-17 DIAGNOSIS — C8114 Nodular sclerosis classical Hodgkin lymphoma, lymph nodes of axilla and upper limb: Secondary | ICD-10-CM | POA: Diagnosis not present

## 2015-11-17 DIAGNOSIS — C8118 Nodular sclerosis classical Hodgkin lymphoma, lymph nodes of multiple sites: Secondary | ICD-10-CM

## 2015-11-17 DIAGNOSIS — E876 Hypokalemia: Secondary | ICD-10-CM

## 2015-11-17 LAB — CBC WITH DIFFERENTIAL/PLATELET
BASOS PCT: 2 %
Basophils Absolute: 0 10*3/uL (ref 0.0–0.1)
EOS PCT: 7 %
Eosinophils Absolute: 0.1 10*3/uL (ref 0.0–0.7)
HEMATOCRIT: 34.2 % — AB (ref 36.0–46.0)
HEMOGLOBIN: 10.6 g/dL — AB (ref 12.0–15.0)
LYMPHS PCT: 65 %
Lymphs Abs: 1.5 10*3/uL (ref 0.7–4.0)
MCH: 27.7 pg (ref 26.0–34.0)
MCHC: 31 g/dL (ref 30.0–36.0)
MCV: 89.5 fL (ref 78.0–100.0)
Monocytes Absolute: 0.4 10*3/uL (ref 0.1–1.0)
Monocytes Relative: 21 %
NEUTROS ABS: 0.1 10*3/uL — AB (ref 1.7–7.7)
NEUTROS PCT: 5 %
Platelets: 190 10*3/uL (ref 150–400)
RBC: 3.82 MIL/uL — ABNORMAL LOW (ref 3.87–5.11)
RDW: 17.1 % — AB (ref 11.5–15.5)
WBC: 2.1 10*3/uL — ABNORMAL LOW (ref 4.0–10.5)

## 2015-11-17 LAB — COMPREHENSIVE METABOLIC PANEL
ALK PHOS: 24 U/L — AB (ref 38–126)
ALT: 32 U/L (ref 14–54)
ANION GAP: 9 (ref 5–15)
AST: 27 U/L (ref 15–41)
Albumin: 3.5 g/dL (ref 3.5–5.0)
BILIRUBIN TOTAL: 0.3 mg/dL (ref 0.3–1.2)
BUN: 15 mg/dL (ref 6–20)
CALCIUM: 9.6 mg/dL (ref 8.9–10.3)
CO2: 27 mmol/L (ref 22–32)
Chloride: 105 mmol/L (ref 101–111)
Creatinine, Ser: 0.55 mg/dL (ref 0.44–1.00)
GLUCOSE: 122 mg/dL — AB (ref 65–99)
POTASSIUM: 3.7 mmol/L (ref 3.5–5.1)
Sodium: 141 mmol/L (ref 135–145)
TOTAL PROTEIN: 6.3 g/dL — AB (ref 6.5–8.1)

## 2015-11-17 MED ORDER — SODIUM CHLORIDE 0.9 % IV SOLN
375.0000 mg/m2 | Freq: Once | INTRAVENOUS | Status: AC
  Administered 2015-11-17: 860 mg via INTRAVENOUS
  Filled 2015-11-17: qty 3

## 2015-11-17 MED ORDER — PALONOSETRON HCL INJECTION 0.25 MG/5ML
0.2500 mg | Freq: Once | INTRAVENOUS | Status: AC
  Administered 2015-11-17: 0.25 mg via INTRAVENOUS
  Filled 2015-11-17: qty 5

## 2015-11-17 MED ORDER — PEGFILGRASTIM 6 MG/0.6ML ~~LOC~~ PSKT
6.0000 mg | PREFILLED_SYRINGE | Freq: Once | SUBCUTANEOUS | Status: AC
Start: 2015-11-17 — End: 2015-11-17
  Administered 2015-11-17: 6 mg via SUBCUTANEOUS
  Filled 2015-11-17: qty 0.6

## 2015-11-17 MED ORDER — POTASSIUM CHLORIDE CRYS ER 20 MEQ PO TBCR: 20.0000 meq | EXTENDED_RELEASE_TABLET | Freq: Three times a day (TID) | ORAL | 1 refills | Status: DC

## 2015-11-17 MED ORDER — SODIUM CHLORIDE 0.9 % IV SOLN
Freq: Once | INTRAVENOUS | Status: AC
  Administered 2015-11-17: 12:00:00 via INTRAVENOUS
  Filled 2015-11-17: qty 5

## 2015-11-17 MED ORDER — DOXORUBICIN HCL CHEMO IV INJECTION 2 MG/ML
25.0000 mg/m2 | Freq: Once | INTRAVENOUS | Status: AC
  Administered 2015-11-17: 56 mg via INTRAVENOUS
  Filled 2015-11-17: qty 28

## 2015-11-17 MED ORDER — SODIUM CHLORIDE 0.9 % IV SOLN
Freq: Once | INTRAVENOUS | Status: AC
  Administered 2015-11-17: 12:00:00 via INTRAVENOUS

## 2015-11-17 MED ORDER — HEPARIN SOD (PORK) LOCK FLUSH 100 UNIT/ML IV SOLN
500.0000 [IU] | Freq: Once | INTRAVENOUS | Status: AC | PRN
  Administered 2015-11-17: 500 [IU]
  Filled 2015-11-17 (×2): qty 5

## 2015-11-17 MED ORDER — VINBLASTINE SULFATE CHEMO INJECTION 1 MG/ML
6.0000 mg/m2 | Freq: Once | INTRAVENOUS | Status: AC
  Administered 2015-11-17: 13.6 mg via INTRAVENOUS
  Filled 2015-11-17: qty 13.6

## 2015-11-17 MED ORDER — SODIUM CHLORIDE 0.9% FLUSH
10.0000 mL | INTRAVENOUS | Status: DC | PRN
  Administered 2015-11-17: 10 mL
  Filled 2015-11-17: qty 10

## 2015-11-17 NOTE — Progress Notes (Signed)
Labs reviewed with Dr. Whitney Muse including ANC 0.1. Pt approved for chemo tx today with Neulasta added per MD

## 2015-11-17 NOTE — Patient Instructions (Signed)
[facility] Discharge Instructions for Patients Receiving Chemotherapy   Beginning January 23rd 2017 lab work for the [facility] will be done in the  Main lab at [facility] on 1st floor. If you have a lab appointment with the [facility] please come in thru the  Main Entrance and check in at the main information desk   Today you received the following chemotherapy agents Adriamycin,Vinblastine and Dacarbazine as well as Neulasta on-pro. Follow-up as scheduled. Call clinic for any questions or concerns  To help prevent nausea and vomiting after your treatment, we encourage you to take your nausea medication   If you develop nausea and vomiting, or diarrhea that is not controlled by your medication, call the clinic.  The clinic phone number is [phone]. Office hours are Monday-Friday 8:30am-5:00pm.  BELOW ARE SYMPTOMS THAT SHOULD BE REPORTED IMMEDIATELY:  *FEVER GREATER THAN 101.0 F  *CHILLS WITH OR WITHOUT FEVER  NAUSEA AND VOMITING THAT IS NOT CONTROLLED WITH YOUR NAUSEA MEDICATION  *UNUSUAL SHORTNESS OF BREATH  *UNUSUAL BRUISING OR BLEEDING  TENDERNESS IN MOUTH AND THROAT WITH OR WITHOUT PRESENCE OF ULCERS  *URINARY PROBLEMS  *BOWEL PROBLEMS  UNUSUAL RASH Items with * indicate a potential emergency and should be followed up as soon as possible. If you have an emergency after office hours please contact your primary care physician or go to the nearest emergency department.  Please call the clinic during office hours if you have any questions or concerns.   You may also contact the Patient Navigator at [phone] should you have any questions or need assistance in obtaining follow up care.      Resources For Cancer Patients and their Caregivers ? American Cancer Society: Can assist with transportation, wigs, general needs, runs Look Good Feel Better.        (713)714-8026 ? Cancer Care: Provides financial assistance, online support  groups, medication/co-pay assistance.  1-800-813-HOPE 917-612-4295) ? [facility] Assists [place] cancer patients and their families through emotional , educational and financial support.  214 087 3486 ? [place] DSS Where to apply for food stamps, Medicaid and utility assistance. (978)855-3809 ? RCATS: Transportation to medical appointments. 4451852059 ? Social Security Administration: May apply for disability if have a Stage IV cancer. 430-602-9323 551-790-7037 ? [employer], Disability and Transit Services: Assists with nutrition, care and transit needs. [phone]

## 2015-11-17 NOTE — Progress Notes (Signed)
Holly Stephens tolerated chemo tx well without complaints or incident. Labs reviewed including ANC 0.1, with Dr. Whitney Muse prior to administering chemo. Chemo was approved for today with Neulasta added per MD. Influenza vaccine held for today per MD also. Pt has been on O2 at 2 L per N/C . O2 sat checked after pt ambulated and was 92% and pt denied any SOB so pt can start using her O2 on a PRN basis at home per MD orders. Pt discharged via wheelchair in satisfactory condition accompanied by her friend

## 2015-11-23 ENCOUNTER — Ambulatory Visit: Payer: 59 | Admitting: Podiatry

## 2015-12-01 ENCOUNTER — Encounter (HOSPITAL_COMMUNITY): Payer: 59 | Attending: Hematology & Oncology

## 2015-12-01 ENCOUNTER — Encounter (HOSPITAL_BASED_OUTPATIENT_CLINIC_OR_DEPARTMENT_OTHER): Payer: 59 | Admitting: Oncology

## 2015-12-01 ENCOUNTER — Other Ambulatory Visit: Payer: 59

## 2015-12-01 ENCOUNTER — Encounter (HOSPITAL_COMMUNITY): Payer: Self-pay | Admitting: Oncology

## 2015-12-01 VITALS — BP 154/82 | HR 104 | Temp 97.7°F | Resp 20 | Wt 257.9 lb

## 2015-12-01 DIAGNOSIS — Z5189 Encounter for other specified aftercare: Secondary | ICD-10-CM

## 2015-12-01 DIAGNOSIS — C8118 Nodular sclerosis classical Hodgkin lymphoma, lymph nodes of multiple sites: Secondary | ICD-10-CM

## 2015-12-01 DIAGNOSIS — C8119 Nodular sclerosis classical Hodgkin lymphoma, extranodal and solid organ sites: Secondary | ICD-10-CM

## 2015-12-01 DIAGNOSIS — Z23 Encounter for immunization: Secondary | ICD-10-CM

## 2015-12-01 DIAGNOSIS — Z5111 Encounter for antineoplastic chemotherapy: Secondary | ICD-10-CM

## 2015-12-01 DIAGNOSIS — C8114 Nodular sclerosis classical Hodgkin lymphoma, lymph nodes of axilla and upper limb: Secondary | ICD-10-CM

## 2015-12-01 DIAGNOSIS — B20 Human immunodeficiency virus [HIV] disease: Secondary | ICD-10-CM | POA: Diagnosis not present

## 2015-12-01 LAB — COMPREHENSIVE METABOLIC PANEL
ALT: 39 U/L (ref 14–54)
ANION GAP: 7 (ref 5–15)
AST: 39 U/L (ref 15–41)
Albumin: 3.8 g/dL (ref 3.5–5.0)
Alkaline Phosphatase: 55 U/L (ref 38–126)
BILIRUBIN TOTAL: 0.6 mg/dL (ref 0.3–1.2)
BUN: 11 mg/dL (ref 6–20)
CHLORIDE: 104 mmol/L (ref 101–111)
CO2: 27 mmol/L (ref 22–32)
Calcium: 9.8 mg/dL (ref 8.9–10.3)
Creatinine, Ser: 0.71 mg/dL (ref 0.44–1.00)
Glucose, Bld: 115 mg/dL — ABNORMAL HIGH (ref 65–99)
POTASSIUM: 3.8 mmol/L (ref 3.5–5.1)
Sodium: 138 mmol/L (ref 135–145)
TOTAL PROTEIN: 6.8 g/dL (ref 6.5–8.1)

## 2015-12-01 LAB — CBC WITH DIFFERENTIAL/PLATELET
BASOS PCT: 1 %
Basophils Absolute: 0.1 10*3/uL (ref 0.0–0.1)
EOS ABS: 0 10*3/uL (ref 0.0–0.7)
EOS PCT: 0 %
HCT: 35.9 % — ABNORMAL LOW (ref 36.0–46.0)
HEMOGLOBIN: 11.3 g/dL — AB (ref 12.0–15.0)
Lymphocytes Relative: 16 %
Lymphs Abs: 2.3 10*3/uL (ref 0.7–4.0)
MCH: 28.1 pg (ref 26.0–34.0)
MCHC: 31.5 g/dL (ref 30.0–36.0)
MCV: 89.3 fL (ref 78.0–100.0)
MONO ABS: 0.9 10*3/uL (ref 0.1–1.0)
Monocytes Relative: 6 %
Neutro Abs: 11.3 10*3/uL — ABNORMAL HIGH (ref 1.7–7.7)
Neutrophils Relative %: 77 %
Platelets: 161 10*3/uL (ref 150–400)
RBC: 4.02 MIL/uL (ref 3.87–5.11)
RDW: 18.4 % — AB (ref 11.5–15.5)
WBC Morphology: INCREASED
WBC: 14.6 10*3/uL — ABNORMAL HIGH (ref 4.0–10.5)

## 2015-12-01 MED ORDER — VINBLASTINE SULFATE CHEMO INJECTION 1 MG/ML
6.0000 mg/m2 | Freq: Once | INTRAVENOUS | Status: AC
  Administered 2015-12-01: 13.6 mg via INTRAVENOUS
  Filled 2015-12-01: qty 13.6

## 2015-12-01 MED ORDER — PALONOSETRON HCL INJECTION 0.25 MG/5ML
INTRAVENOUS | Status: AC
  Filled 2015-12-01: qty 5

## 2015-12-01 MED ORDER — SODIUM CHLORIDE 0.9 % IV SOLN
375.0000 mg/m2 | Freq: Once | INTRAVENOUS | Status: AC
  Administered 2015-12-01: 860 mg via INTRAVENOUS
  Filled 2015-12-01: qty 43

## 2015-12-01 MED ORDER — SODIUM CHLORIDE 0.9% FLUSH: 10.0000 mL | INTRAVENOUS | Status: DC | PRN

## 2015-12-01 MED ORDER — FOSAPREPITANT DIMEGLUMINE INJECTION 150 MG
Freq: Once | INTRAVENOUS | Status: AC
  Administered 2015-12-01: 13:00:00 via INTRAVENOUS
  Filled 2015-12-01: qty 5

## 2015-12-01 MED ORDER — INFLUENZA VAC SPLIT QUAD 0.5 ML IM SUSY
0.5000 mL | PREFILLED_SYRINGE | Freq: Once | INTRAMUSCULAR | Status: AC
  Administered 2015-12-01: 0.5 mL via INTRAMUSCULAR

## 2015-12-01 MED ORDER — PEGFILGRASTIM 6 MG/0.6ML ~~LOC~~ PSKT
6.0000 mg | PREFILLED_SYRINGE | Freq: Once | SUBCUTANEOUS | Status: AC
  Administered 2015-12-01: 6 mg via SUBCUTANEOUS

## 2015-12-01 MED ORDER — PALONOSETRON HCL INJECTION 0.25 MG/5ML
0.2500 mg | Freq: Once | INTRAVENOUS | Status: AC
  Administered 2015-12-01: 0.25 mg via INTRAVENOUS

## 2015-12-01 MED ORDER — PEGFILGRASTIM 6 MG/0.6ML ~~LOC~~ PSKT
PREFILLED_SYRINGE | SUBCUTANEOUS | Status: AC
  Filled 2015-12-01: qty 0.6

## 2015-12-01 MED ORDER — SODIUM CHLORIDE 0.9 % IV SOLN
Freq: Once | INTRAVENOUS | Status: AC
  Administered 2015-12-01: 13:00:00 via INTRAVENOUS

## 2015-12-01 MED ORDER — HEPARIN SOD (PORK) LOCK FLUSH 100 UNIT/ML IV SOLN
500.0000 [IU] | Freq: Once | INTRAVENOUS | Status: AC | PRN
  Administered 2015-12-01: 500 [IU]

## 2015-12-01 MED ORDER — INFLUENZA VAC SPLIT QUAD 0.5 ML IM SUSY
PREFILLED_SYRINGE | INTRAMUSCULAR | Status: AC
  Filled 2015-12-01: qty 0.5

## 2015-12-01 MED ORDER — HEPARIN SOD (PORK) LOCK FLUSH 100 UNIT/ML IV SOLN
INTRAVENOUS | Status: AC
  Filled 2015-12-01: qty 5

## 2015-12-01 MED ORDER — DOXORUBICIN HCL CHEMO IV INJECTION 2 MG/ML
25.0000 mg/m2 | Freq: Once | INTRAVENOUS | Status: AC
  Administered 2015-12-01: 56 mg via INTRAVENOUS
  Filled 2015-12-01: qty 28

## 2015-12-01 NOTE — Assessment & Plan Note (Addendum)
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  Started ABVD on 09/13/2015 with curative intent with the discontinuation of Bleomycin on 10/11/2015.  PET scan after cycle #3 demonstrated Deauville 3 and therefore 4 more cycles of AVD is recommended.  Course complicated by hospitalization for diastolic congestive heart failure in addition to HIV+ that is well controlled.  Oncology history updated.  Pre-treatment labs today: CBC diff, CMET.  I personally reviewed and went over laboratory results with the patient.  The results are noted within this dictation.  Labs satisfy treatment parameters.  She will get a Neulasta OnPro today.  If her [place] is more robust in 2 weeks with her next treatment, one could argue to hold Neulasta.  She has an appointment with Dr. Haroldine Laws on 12/06/2015 for consultation.  She will get an influenza vaccine today.  Return in 2 weeks for follow-up and treatment.

## 2015-12-01 NOTE — Progress Notes (Signed)
Patient tolerated infusion well.  VSS. Patient ambulatory and stable upon discharge.   Flu vaccination given by front office staff.

## 2015-12-01 NOTE — Patient Instructions (Signed)
[facility] Discharge Instructions for Patients Receiving Chemotherapy   Beginning January 23rd 2017 lab work for the [facility] will be done in the  Main lab at [facility] on 1st floor. If you have a lab appointment with the [facility] please come in thru the  Main Entrance and check in at the main information desk   Today you received the following chemotherapy agents: Adriamycin, bleomycin, and DTIC.     If you develop nausea and vomiting, or diarrhea that is not controlled by your medication, call the clinic.  The clinic phone number is [phone]. Office hours are Monday-Friday 8:30am-5:00pm.  BELOW ARE SYMPTOMS THAT SHOULD BE REPORTED IMMEDIATELY:  *FEVER GREATER THAN 101.0 F  *CHILLS WITH OR WITHOUT FEVER  NAUSEA AND VOMITING THAT IS NOT CONTROLLED WITH YOUR NAUSEA MEDICATION  *UNUSUAL SHORTNESS OF BREATH  *UNUSUAL BRUISING OR BLEEDING  TENDERNESS IN MOUTH AND THROAT WITH OR WITHOUT PRESENCE OF ULCERS  *URINARY PROBLEMS  *BOWEL PROBLEMS  UNUSUAL RASH Items with * indicate a potential emergency and should be followed up as soon as possible. If you have an emergency after office hours please contact your primary care physician or go to the nearest emergency department.  Please call the clinic during office hours if you have any questions or concerns.   You may also contact the Patient Navigator at [phone] should you have any questions or need assistance in obtaining follow up care.      Resources For Cancer Patients and their Caregivers ? American Cancer Society: Can assist with transportation, wigs, general needs, runs Look Good Feel Better.        913-119-8721 ? Cancer Care: Provides financial assistance, online support groups, medication/co-pay assistance.  1-800-813-HOPE 253 779 6611) ? [place] Assists [place] cancer patients and their families through emotional , educational and financial  support.  (431)535-6958 ? [place] DSS Where to apply for food stamps, Medicaid and utility assistance. (951)626-7792 ? [facility]ATS: Transportation to medical appointments. 718-456-6388 ? Social Security Administration: May apply for disability if have a Stage IV cancer. (970)541-3483 930-771-9416 ? [employer], Disability and Transit Services: Assists with nutrition, care and transit needs. 8452566916

## 2015-12-01 NOTE — Progress Notes (Signed)
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  INTERVAL HISTORY: Holly Stephens 60 y.o. female returns for followup of Hodgkin's Lymphoma, nodular sclerosing-type, classical Hodgkin's Disease with lymphatic involvement in multiple lymph nodes superior and inferior to the diaphragm with PET imaging demonstrating findings concerning for pulmonary involvement (Stage III versus Stage IV disease ?pulmonary nodules x 2?).  Started ABVD on 09/13/2015 with curative intent with the discontinuation of Bleomycin on 10/11/2015.  PET scan after cycle #3 demonstrated Deauville 3 and therefore 4 more cycles of AVD is recommended.  Course complicated by hospitalization for diastolic congestive heart failure in addition to HIV+ that is well controlled.    [name], nodular sclerosis ([place])   07/23/2015 Procedure    Lext axilla lymph node biopsy      07/27/2015 Pathology Results    Classical [name], nodular sclerosis type.  Positive CD30, CD15, andPAX-5.  Negative CD45 and CD20.      08/31/2015 PET scan    Hypermetabolic lymphadenopathy. Nodal stations include: L supraclavicular nodes, L axillary nodes, mediastinal lymph nodes, periaortic retroperitoneal and periportal lymph nodes as well as L iliac and inguinal lymph nodes. Two concerning pulm nodules...      09/01/2015 Imaging    MUGA- Normal LEFT ventricular ejection fraction of 73%.  Normal LEFT ventricular wall motion.      09/01/2015 Procedure    PFTs-  Spirometry shows mild ventilatory defect without airflow obstruction and no  bronchodilator improvement. Lung volumes are normal. Airway resistance is normal. DLCO normal. Essentially normal lung function      09/08/2015 Procedure    Port placed by Dr. Rosana Hoes      09/13/2015 -  Chemotherapy    ABVD      09/27/2015 Imaging    CT angio of chest- no gross PE seen. Improving axillary, mediastinal and upper abdominal adenopathy. Improved and possibly resolved RUL nodule and with resolved or obscured LLL nodule. Small L pleural effusion and mild L lower lobe atelectasis.      10/11/2015 Treatment Plan Change    Bleomycin discontinued       11/01/2015 PET scan    Clear interval response to therapy with decrease in size and hypermetabolism of larger lymph nodes seen previously and some of the smaller hypermetabolic lymph node seen on the previous study have resolved completely in terms of abnormal enlargement and hypermetabolism. 2. Slight decrease in left lower lobe pulmonary nodule with interval resolution of the right upper lobe pulmonary nodule. 3. Stable appearance of mild heterogeneous FDG accumulation within the marrow space.      11/01/2015 Miscellaneous    Deauville 3- 4 more cycles of AVD recommended according to NCCN guidelines.       She notes that she is doing well.  She reports that with the discontinuation of BLEOMYCIN from her treatment, her breathing is much improved.  She notes that she tolerated her last treatment better.  She denies any complaints today.  She notes that her lower extremities are no longer edematous.  Review of Systems  Constitutional: Negative.  Negative for chills, fever and weight loss.  HENT: Negative.  Eyes: Negative.   Respiratory: Negative.  Negative for cough, hemoptysis, sputum production, shortness of breath and wheezing.   Cardiovascular: Negative.  Negative for chest pain.  Gastrointestinal: Negative.  Negative for nausea and vomiting.  Genitourinary: Negative.   Musculoskeletal: Negative.   Skin: Negative.     Neurological: Negative.  Negative for weakness.  Endo/Heme/Allergies: Negative.   Psychiatric/Behavioral: Negative.     Past Medical History:  Diagnosis Date  . Arthritis    osteoarthritis  . Diabetes mellitus without complication ([place])   . HIV infection ([place])   . [name], nodular sclerosis ([place]) 08/23/2015  . Human immunodeficiency virus (HIV) disease ([place]) 02/23/2006   Qualifier: Diagnosis of  By: Megan Salon MD, John      Past Surgical History:  Procedure Laterality Date  . AXILLARY LYMPH NODE BIOPSY Left 07/23/2015   Procedure: EXCISIONAL BIOPSY LEFT AXILLARY LYMPH NODE;  Surgeon: [name], MD;  Location: [facility];  Service: General;  Laterality: Left;  . PORTACATH PLACEMENT Right 09/08/2015   Procedure: INSERTION OF CENTRAL VENOUS CATHETER WITH PORT FOR CHEMOTHERAPY;  Surgeon: [name], MD;  Location: [facility];  Service: General;  Laterality: Right;  . SHOULDER SURGERY Right    removal of bone spur    Family History  Problem Relation Age of Onset  . Hypertension Mother   . Cancer Mother 61    breast   . Hypertension Father   . Heart attack Father   . Pulmonary embolism Sister   . Parkinson's disease Brother     Social History   Social History  . Marital status: Widowed    Spouse name: N/A  . Number of children: N/A  . Years of education: N/A   Social History Main Topics  . Smoking status: Never Smoker  . Smokeless tobacco: Never Used  . Alcohol use No  . Drug use: No  . Sexual activity: No     Comment: declined condoms   Other Topics Concern  . None   Social History Narrative  . None     PHYSICAL EXAMINATION  ECOG PERFORMANCE STATUS: 1 - Symptomatic but completely ambulatory  There were no vitals filed for this visit.  Oncology Vitals 12/01/2015  Temp 97.7  Pulse 104  Resp 20  SpO2 97    GENERAL:alert, no distress, well nourished, well developed, comfortable, cooperative, obese, smiling and accompanied by friend. SKIN: skin  color, texture, turgor are normal, no rashes or significant lesions HEAD: Normocephalic, No masses, lesions, tenderness or abnormalities EYES: normal, EOMI, Conjunctiva are pink and non-injected EARS: External ears normal OROPHARYNX:lips, buccal mucosa, and tongue normal and mucous membranes are moist  NECK: supple, thyroid normal size, non-tender, without nodularity, trachea midline LYMPH:  no palpable lymphadenopathy BREAST:not examined LUNGS: clear to auscultation  HEART: regular rate & rhythm, no murmurs, no gallops, S1 normal and S2 normal ABDOMEN:abdomen soft, non-tender, obese and normal bowel sounds BACK: Back symmetric, no curvature., No CVA tenderness EXTREMITIES:less then 2 second capillary refill, no joint deformities, effusion, or inflammation, no edema, no skin discoloration, no cyanosis  NEURO: alert & oriented x 3 with fluent speech, no focal motor/sensory deficits, gait normal   LABORATORY DATA: CBC    Component Value Date/Time   WBC 14.6 (H) 12/01/2015 1128   RBC 4.02 12/01/2015 1128   HGB 11.3 (L) 12/01/2015 1128   HCT 35.9 (L) 12/01/2015 1128   PLT 161 12/01/2015 1128   MCV 89.3 12/01/2015 1128   MCH 28.1 12/01/2015 1128   MCHC  31.5 12/01/2015 1128   RDW 18.4 (H) 12/01/2015 1128   LYMPHSABS 2.3 12/01/2015 1128   MONOABS 0.9 12/01/2015 1128   EOSABS 0.0 12/01/2015 1128   BASOSABS 0.1 12/01/2015 1128      Chemistry      Component Value Date/Time   NA 138 12/01/2015 1128   K 3.8 12/01/2015 1128   CL 104 12/01/2015 1128   CO2 27 12/01/2015 1128   BUN 11 12/01/2015 1128   CREATININE 0.71 12/01/2015 1128   CREATININE 0.52 06/01/2015 0900      Component Value Date/Time   CALCIUM 9.8 12/01/2015 1128   ALKPHOS 55 12/01/2015 1128   AST 39 12/01/2015 1128   ALT 39 12/01/2015 1128   BILITOT 0.6 12/01/2015 1128        PENDING LABS:   RADIOGRAPHIC STUDIES:  No results found.   PATHOLOGY:    ASSESSMENT AND PLAN:  [name], nodular  sclerosis (HCC) Hodgkin's Lymphoma, nodular sclerosing-type, classical Hodgkin's Disease with lymphatic involvement in multiple lymph nodes superior and inferior to the diaphragm with PET imaging demonstrating findings concerning for pulmonary involvement (Stage III versus Stage IV disease ?pulmonary nodules x 2?).  Started ABVD on 09/13/2015 with curative intent with the discontinuation of Bleomycin on 10/11/2015.  PET scan after cycle #3 demonstrated Deauville 3 and therefore 4 more cycles of AVD is recommended.  Course complicated by hospitalization for diastolic congestive heart failure in addition to HIV+ that is well controlled.  Oncology history updated.  Pre-treatment labs today: CBC diff, CMET.  I personally reviewed and went over laboratory results with the patient.  The results are noted within this dictation.  Labs satisfy treatment parameters.  She will get a Neulasta OnPro today.  If her [place] is more robust in 2 weeks with her next treatment, one could argue to hold Neulasta.  She has an appointment with Dr. Haroldine Laws on 12/06/2015 for consultation.  She will get an influenza vaccine today.  Return in 2 weeks for follow-up and treatment.   ORDERS PLACED FOR THIS ENCOUNTER: Orders Placed This Encounter  Procedures  . CBC with Differential  . Comprehensive metabolic panel    MEDICATIONS PRESCRIBED THIS ENCOUNTER: Meds ordered this encounter  Medications  . Influenza vac split quadrivalent PF (FLUARIX) injection 0.5 mL    THERAPY PLAN:  Continue therapy as planned for 4 cycles post-PET imaging.  All questions were answered. The patient knows to call the clinic with any problems, questions or concerns. We can certainly see the patient much sooner if necessary.  Patient and plan discussed with Dr. Ancil Linsey and she is in agreement with the aforementioned.   This note is electronically signed by: Doy Mince 12/01/2015 6:24 PM

## 2015-12-01 NOTE — Progress Notes (Signed)
Pt given flu shot in left deltoid. Pt tolerated well. Pt stable.  

## 2015-12-01 NOTE — Patient Instructions (Signed)
[place] at [facility] Discharge Instructions  RECOMMENDATIONS MADE BY THE CONSULTANT AND ANY TEST RESULTS WILL BE SENT TO YOUR REFERRING PHYSICIAN.  You were seen today by [name] PA-C. Flu shot given today. Pre-treatment labs and follow up in two weeks  Thank you for choosing [place] at [facility] to provide your oncology and hematology care.  To afford each patient quality time with our provider, please arrive at least 15 minutes before your scheduled appointment time.   Beginning January 23rd 2017 lab work for the [facility] will be done in the  Main lab at [facility] on 1st floor. If you have a lab appointment with the [place] please come in thru the  Main Entrance and check in at the main information desk  You need to re-schedule your appointment should you arrive 10 or more minutes late.  We strive to give you quality time with our providers, and arriving late affects you and other patients whose appointments are after yours.  Also, if you no show three or more times for appointments you may be dismissed from the clinic at the providers discretion.     Again, thank you for choosing [facility].  Our hope is that these requests will decrease the amount of time that you wait before being seen by our physicians.       _____________________________________________________________  Should you have questions after your visit to [facility], please contact our office at [phone] between the hours of 8:30 a.m. and 4:30 p.m.  Voicemails left after 4:30 p.m. will not be returned until the following business day.  For prescription refill requests, have your pharmacy contact our office.         Resources For Cancer Patients and their Caregivers ? American Cancer Society: Can assist with transportation, wigs, general needs, runs Look Good Feel Better.        (423)713-4823 ? Cancer  Care: Provides financial assistance, online support groups, medication/co-pay assistance.  1-800-813-HOPE (614)776-7189) ? [place] Assists [place] cancer patients and their families through emotional , educational and financial support.  978-763-5953 ? [place] DSS Where to apply for food stamps, Medicaid and utility assistance. 939-180-7070 ? RCATS: Transportation to medical appointments. 682-097-8598 ? Social Security Administration: May apply for disability if have a Stage IV cancer. ([phone] 814-428-9765 ? [employer], Disability and Transit Services: Assists with nutrition, care and transit needs. [place] Support Programs: @10RELATIVEDAYS @ > Cancer Support Group  2nd Tuesday of the month 1pm-2pm, Journey Room  > Creative Journey  3rd Tuesday of the month 1130am-1pm, Journey Room  > Look Good Feel Better  1st Wednesday of the month 10am-12 noon, Journey Room (Call [place] to register [phone])

## 2015-12-02 ENCOUNTER — Other Ambulatory Visit: Payer: 59

## 2015-12-02 DIAGNOSIS — B20 Human immunodeficiency virus [HIV] disease: Secondary | ICD-10-CM

## 2015-12-02 LAB — CBC
HCT: 36.8 % (ref 35.0–45.0)
Hemoglobin: 12 g/dL (ref 11.7–15.5)
MCH: 28.4 pg (ref 27.0–33.0)
MCHC: 32.6 g/dL (ref 32.0–36.0)
MCV: 87 fL (ref 80.0–100.0)
MPV: 9.7 fL (ref 7.5–12.5)
PLATELETS: 200 10*3/uL (ref 140–400)
RBC: 4.23 MIL/uL (ref 3.80–5.10)
RDW: 19.1 % — AB (ref 11.0–15.0)
WBC: 18.5 10*3/uL — ABNORMAL HIGH (ref 3.8–10.8)

## 2015-12-02 LAB — COMPREHENSIVE METABOLIC PANEL
ALT: 36 U/L — ABNORMAL HIGH (ref 6–29)
AST: 29 U/L (ref 10–35)
Albumin: 4.3 g/dL (ref 3.6–5.1)
Alkaline Phosphatase: 63 U/L (ref 33–130)
BUN: 14 mg/dL (ref 7–25)
CHLORIDE: 101 mmol/L (ref 98–110)
CO2: 21 mmol/L (ref 20–31)
CREATININE: 0.57 mg/dL (ref 0.50–0.99)
Calcium: 9.8 mg/dL (ref 8.6–10.4)
GLUCOSE: 185 mg/dL — AB (ref 65–99)
Potassium: 4.1 mmol/L (ref 3.5–5.3)
SODIUM: 139 mmol/L (ref 135–146)
Total Bilirubin: 0.6 mg/dL (ref 0.2–1.2)
Total Protein: 6.7 g/dL (ref 6.1–8.1)

## 2015-12-03 ENCOUNTER — Other Ambulatory Visit (HOSPITAL_COMMUNITY): Payer: Self-pay | Admitting: Pharmacist

## 2015-12-03 ENCOUNTER — Encounter (HOSPITAL_COMMUNITY): Payer: Self-pay

## 2015-12-03 ENCOUNTER — Encounter (HOSPITAL_BASED_OUTPATIENT_CLINIC_OR_DEPARTMENT_OTHER): Payer: 59

## 2015-12-03 VITALS — BP 119/56 | HR 86 | Temp 98.2°F | Resp 16

## 2015-12-03 DIAGNOSIS — C8119 Nodular sclerosis classical Hodgkin lymphoma, extranodal and solid organ sites: Secondary | ICD-10-CM | POA: Diagnosis not present

## 2015-12-03 DIAGNOSIS — Z5189 Encounter for other specified aftercare: Secondary | ICD-10-CM

## 2015-12-03 LAB — T-HELPER CELL (CD4) - (RCID CLINIC ONLY)
CD4 T CELL ABS: 290 /uL — AB (ref 400–2700)
CD4 T CELL HELPER: 21 % — AB (ref 33–55)

## 2015-12-03 LAB — HIV-1 RNA QUANT-NO REFLEX-BLD

## 2015-12-03 MED ORDER — PEGFILGRASTIM 6 MG/0.6ML ~~LOC~~ PSKT: 6.0000 mg | PREFILLED_SYRINGE | Freq: Once | SUBCUTANEOUS | Status: DC

## 2015-12-03 MED ORDER — PEGFILGRASTIM INJECTION 6 MG/0.6ML ~~LOC~~
6.0000 mg | PREFILLED_SYRINGE | Freq: Once | SUBCUTANEOUS | Status: AC
  Administered 2015-12-03: 6 mg via SUBCUTANEOUS

## 2015-12-03 MED ORDER — PEGFILGRASTIM INJECTION 6 MG/0.6ML ~~LOC~~
PREFILLED_SYRINGE | SUBCUTANEOUS | Status: AC
  Filled 2015-12-03: qty 0.6

## 2015-12-03 NOTE — Progress Notes (Signed)
Pt here today for Neulasta injection. Pt given Neulasta in her right upper abdomen. Pt tolerated well. Pt stable and discharged home ambulatory. Pt to return as scheduled.

## 2015-12-03 NOTE — Patient Instructions (Signed)
[place] at [facility] Discharge Instructions  RECOMMENDATIONS MADE BY THE CONSULTANT AND ANY TEST RESULTS WILL BE SENT TO YOUR REFERRING PHYSICIAN.  You were given a Neulasta injection today. Return as scheduled.   Thank you for choosing [place] at [facility] to provide your oncology and hematology care.  To afford each patient quality time with our provider, please arrive at least 15 minutes before your scheduled appointment time.   Beginning January 23rd 2017 lab work for the [facility] will be done in the  Main lab at [facility] on 1st floor. If you have a lab appointment with the [place] please come in thru the  Main Entrance and check in at the main information desk  You need to re-schedule your appointment should you arrive 10 or more minutes late.  We strive to give you quality time with our providers, and arriving late affects you and other patients whose appointments are after yours.  Also, if you no show three or more times for appointments you may be dismissed from the clinic at the providers discretion.     Again, thank you for choosing [facility].  Our hope is that these requests will decrease the amount of time that you wait before being seen by our physicians.       _____________________________________________________________  Should you have questions after your visit to [facility], please contact our office at [phone] between the hours of 8:30 a.m. and 4:30 p.m.  Voicemails left after 4:30 p.m. will not be returned until the following business day.  For prescription refill requests, have your pharmacy contact our office.         Resources For Cancer Patients and their Caregivers ? American Cancer Society: Can assist with transportation, wigs, general needs, runs Look Good Feel Better.        408-215-9011 ? Cancer Care: Provides financial assistance, online support  groups, medication/co-pay assistance.  1-800-813-HOPE 346-004-9192) ? [place] Assists [place] cancer patients and their families through emotional , educational and financial support.  (661)688-0663 ? [place] DSS Where to apply for food stamps, Medicaid and utility assistance. 757-262-0636 ? RCATS: Transportation to medical appointments. 820-690-3100 ? Social Security Administration: May apply for disability if have a Stage IV cancer. 703 037 8702 705-545-4121 ? [employer], Disability and Transit Services: Assists with nutrition, care and transit needs. [place] Support Programs: @10RELATIVEDAYS @ > Cancer Support Group  2nd Tuesday of the month 1pm-2pm, Journey Room  > Creative Journey  3rd Tuesday of the month 1130am-1pm, Journey Room  > Look Good Feel Better  1st Wednesday of the month 10am-12 noon, Journey Room (Call [place] to register [phone])

## 2015-12-06 ENCOUNTER — Ambulatory Visit (HOSPITAL_COMMUNITY)
Admission: RE | Admit: 2015-12-06 | Discharge: 2015-12-06 | Disposition: A | Discharge status: Home / Self Care | Payer: 59 | Source: Ambulatory Visit | Attending: Internal Medicine | Admitting: Internal Medicine

## 2015-12-06 VITALS — BP 122/64 | HR 86 | Wt 258.0 lb

## 2015-12-06 DIAGNOSIS — E119 Type 2 diabetes mellitus without complications: Secondary | ICD-10-CM | POA: Insufficient documentation

## 2015-12-06 DIAGNOSIS — M1990 Unspecified osteoarthritis, unspecified site: Secondary | ICD-10-CM | POA: Insufficient documentation

## 2015-12-06 DIAGNOSIS — I509 Heart failure, unspecified: Secondary | ICD-10-CM

## 2015-12-06 DIAGNOSIS — C8190 Hodgkin lymphoma, unspecified, unspecified site: Secondary | ICD-10-CM | POA: Insufficient documentation

## 2015-12-06 DIAGNOSIS — Z880 Allergy status to penicillin: Secondary | ICD-10-CM | POA: Diagnosis not present

## 2015-12-06 DIAGNOSIS — Z6841 Body Mass Index (BMI) 40.0 and over, adult: Secondary | ICD-10-CM | POA: Diagnosis not present

## 2015-12-06 DIAGNOSIS — Z79899 Other long term (current) drug therapy: Secondary | ICD-10-CM | POA: Diagnosis not present

## 2015-12-06 DIAGNOSIS — I429 Cardiomyopathy, unspecified: Secondary | ICD-10-CM

## 2015-12-06 DIAGNOSIS — B20 Human immunodeficiency virus [HIV] disease: Secondary | ICD-10-CM | POA: Diagnosis not present

## 2015-12-06 DIAGNOSIS — I110 Hypertensive heart disease with heart failure: Secondary | ICD-10-CM | POA: Insufficient documentation

## 2015-12-06 DIAGNOSIS — I5032 Chronic diastolic (congestive) heart failure: Secondary | ICD-10-CM | POA: Diagnosis not present

## 2015-12-06 DIAGNOSIS — C8100 Nodular lymphocyte predominant Hodgkin lymphoma, unspecified site: Secondary | ICD-10-CM | POA: Diagnosis not present

## 2015-12-06 NOTE — Progress Notes (Signed)
CARDIO_ONCOLOGY CLINIC CONSULT NOTE  Referring Physician: Dr. Whitney Muse   HPI:  Holly Stephens 60 y.o. female with morbid obesity, HIV, HTN, DM, Hodgkin's Disease with lymphatic and possibly pulmonary involvement (Stage III versus Stage IV disease ?pulmonary nodules x 2?).    She started ABVD on 09/13/2015 with curative intent with the discontinuation of Bleomycin on 10/11/2015.  PET scan after cycle #3 demonstrated Deauville 3 and therefore 4 more cycles of AVD were recommended.  Course complicated by hospitalization for diastolic congestive heart failure in 9/17.   Diuresed and improved. Echo with normal EF and grade 1 DD. Feeling much better. Now taking lasix 20 mg daily. Edema controlled. Weight stable. No orthopnea or PND. No CP. Mild exertional dyspnea.   LVEF by MUGA 09/01/15 73% ECHO 97/17 EF 60-65% Grade I DD PAP 36   ONCOLOGIC HISTORY:       [name], nodular sclerosis ([place])   07/23/2015 Procedure    Lext axilla lymph node biopsy     07/27/2015 Pathology Results    Classical [name], nodular sclerosis type.  Positive CD30, CD15, andPAX-5.  Negative CD45 and CD20.     08/31/2015 PET scan    Hypermetabolic lymphadenopathy. Nodal stations include: L supraclavicular nodes, L axillary nodes, mediastinal lymph nodes, periaortic retroperitoneal and periportal lymph nodes as well as L iliac and inguinal lymph nodes. Two concerning pulm nodules...     09/01/2015 Imaging    MUGA- Normal LEFT ventricular ejection fraction of 73%.  Normal LEFT ventricular wall motion.     09/01/2015 Procedure    PFTs-  Spirometry shows mild ventilatory defect without airflow obstruction and no bronchodilator improvement. Lung volumes are normal. Airway resistance is normal. DLCO normal. Essentially normal lung function     09/08/2015 Procedure    Port placed by Dr. Rosana Hoes     09/13/2015 -  Chemotherapy    ABVD     09/27/2015 Imaging    CT angio of chest-  no gross PE seen. Improving axillary, mediastinal and upper abdominal adenopathy. Improved and possibly resolved RUL nodule and with resolved or obscured LLL nodule. Small L pleural effusion and mild L lower lobe atelectasis.     10/11/2015 Treatment Plan Change    Bleomycin discontinued      11/01/2015 PET scan    Clear interval response to therapy with decrease in size and hypermetabolism of larger lymph nodes seen previously and some of the smaller hypermetabolic lymph node seen on the previous study have resolved completely in terms of abnormal enlargement and hypermetabolism. 2. Slight decrease in left lower lobe pulmonary nodule with interval resolution of the right upper lobe pulmonary nodule. 3. Stable appearance of mild heterogeneous FDG accumulation within the marrow space.     11/01/2015 Miscellaneous    Deauville 3- 4 more cycles of AVD recommended according to NCCN guidelines.         Review of Systems: [y] = yes, [ ]  = no   General: Weight gain [ ] ; Weight loss [ ] ; Anorexia [ ] ; Fatigue Blue.Reese ]; Fever [ ] ; Chills [ ] ; Weakness [ ]   Cardiac: Chest pain/pressure [ ] ; Resting SOB [ ] ; Exertional SOB [ y]; Orthopnea [ ] ; Pedal Edema [ ] ; Palpitations [ ] ; Syncope [ ] ; Presyncope [ ] ; Paroxysmal nocturnal dyspnea[ ]   Pulmonary: Cough [ ] ; Wheezing[ ] ; Hemoptysis[ ] ; Sputum [ ] ; Snoring [ ]   GI: Vomiting[ ] ; Dysphagia[ ] ; Melena[ ] ; Hematochezia [ ] ; Heartburn[ ] ; Abdominal pain [ ] ; Constipation [ ] ;  Diarrhea [ ] ; BRBPR [ ]   GU: Hematuria[ ] ; Dysuria [ ] ; Nocturia[ ]   Vascular: Pain in legs with walking [ ] ; Pain in feet with lying flat [ ] ; Non-healing sores [ ] ; Stroke [ ] ; TIA [ ] ; Slurred speech [ ] ;  Neuro: Headaches[ ] ; Vertigo[ ] ; Seizures[ ] ; Paresthesias[ ] ;Blurred vision [ ] ; Diplopia [ ] ; Vision changes [ ]   Ortho/Skin: Arthritis Blue.Reese ]; Joint pain [ y]; Muscle pain [ ] ; Joint swelling [ ] ; Back Pain [ ] ; Rash [ ]   Psych: Depression[ ] ; Anxiety[ ]   Heme:  Bleeding problems [ ] ; Clotting disorders [ ] ; Anemia [ ]   Endocrine: Diabetes Blue.Reese ]; Thyroid dysfunction[ ]    Past Medical History:  Diagnosis Date  . Arthritis    osteoarthritis  . Diabetes mellitus without complication ([place])   . HIV infection ([place])   . [name], nodular sclerosis ([place]) 08/23/2015  . Human immunodeficiency virus (HIV) disease ([place]) 02/23/2006   Qualifier: Diagnosis of  By: Megan Salon MD, John      Current Outpatient Prescriptions  Medication Sig Dispense Refill  . acetaminophen (TYLENOL) 650 MG CR tablet Take 650 mg by mouth every 8 (eight) hours as needed for pain.    Marland Kitchen bleomycin in sodium chloride 0.9 % 50 mL Inject into the vein every 14 (fourteen) days.     . cholecalciferol (VITAMIN D) 1000 units tablet Take 2,000 Units by mouth daily.    . dacarbazine (DTIC) 200 MG chemo injection Inject into the vein every 14 (fourteen) days.     . Darunavir Ethanolate ([name]) 800 MG tablet Take 1 tablet (800 mg total) by mouth daily with breakfast. 90 tablet 3  . dexamethasone (DECADRON) 4 MG tablet Take 8 mg by mouth 2 (two) times daily with a meal. Pt is to take for three days after chemo.    Marland Kitchen DOXOrubicin (ADRIAMYCIN) 50 MG chemo injection Inject 60 mg into the vein every 14 (fourteen) days.     Marland Kitchen elvitegravir-cobicistat-emtricitabine-tenofovir ([name]) 150-150-200-10 MG TABS tablet Take 1 tablet by mouth daily with breakfast. 90 tablet 3  . fenofibrate 160 MG tablet Take 160 mg by mouth daily.     . furosemide (LASIX) 40 MG tablet Take 0.5 tablets (20 mg total) by mouth daily. 30 tablet 0  . Garlic 123XX123 MG CAPS Take 1,000 mg by mouth daily.     Marland Kitchen JANUMET 50-500 MG per tablet Take 1 tablet by mouth 2 (two) times daily with a meal.     . lidocaine-prilocaine (EMLA) cream Apply 1 application topically as needed (prior to accessing port).    . loratadine (CLARITIN) 10 MG tablet Take 10 mg by mouth See admin instructions. Pt only takes the day before chemo and five days  after.    Marland Kitchen LORazepam (ATIVAN) 0.5 MG tablet Take 0.5 mg by mouth every 8 (eight) hours as needed for anxiety.    . metoprolol tartrate (LOPRESSOR) 25 MG tablet Take 1 tablet (25 mg total) by mouth daily. 30 tablet 0  . Multiple Vitamin (MULTIVITAMIN WITH MINERALS) TABS tablet Take 1 tablet by mouth daily.    . ondansetron (ZOFRAN) 8 MG tablet     . oxyCODONE-acetaminophen (ROXICET) 5-325 MG tablet Take 1 tablet by mouth every 4 (four) hours as needed for severe pain. 30 tablet 0  . potassium chloride SA (K-DUR,KLOR-CON) 20 MEQ tablet Take 1 tablet (20 mEq total) by mouth 3 (three) times daily. 90 tablet 1  . vinBLAStine in sodium chloride 0.9 %  50 mL Inject into the vein every 14 (fourteen) days.     . Diclofenac Sodium (PENNSAID) 2 % SOLN Place 2 application onto the skin 2 (two) times daily as needed (for pain).     . metoprolol (LOPRESSOR) 50 MG tablet     . pantoprazole (PROTONIX) 40 MG tablet Take 1 tablet (40 mg total) by mouth daily. (Patient not taking: Reported on 12/06/2015) 90 tablet 2  . prochlorperazine (COMPAZINE) 10 MG tablet Take 1 tablet (10 mg total) by mouth every 6 (six) hours as needed for nausea or vomiting. (Patient not taking: Reported on 12/06/2015) 120 tablet 0  . spironolactone (ALDACTONE) 50 MG tablet      No current facility-administered medications for this encounter.     Allergies  Allergen Reactions  . Augmentin [Amoxicillin-Pot Clavulanate] Hives and Other (See Comments)    Has patient had a PCN reaction causing immediate rash, facial/tongue/throat swelling, SOB or lightheadedness with hypotension: No Has patient had a PCN reaction causing severe rash involving mucus membranes or skin necrosis: No Has patient had a PCN reaction that required hospitalization No Has patient had a PCN reaction occurring within the last 10 years: No If all of the above answers are "NO", then may proceed with Cephalosporin use.      Social History   Social History  .  Marital status: Widowed    Spouse name: N/A  . Number of children: N/A  . Years of education: N/A   Occupational History  . Not on file.   Social History Main Topics  . Smoking status: Never Smoker  . Smokeless tobacco: Never Used  . Alcohol use No  . Drug use: No  . Sexual activity: No     Comment: declined condoms   Other Topics Concern  . Not on file   Social History Narrative  . No narrative on file      Family History  Problem Relation Age of Onset  . Hypertension Mother   . Cancer Mother 18    breast   . Hypertension Father   . Heart attack Father   . Pulmonary embolism Sister   . Parkinson's disease Brother     Vitals:   12/06/15 1129  BP: 122/64  Pulse: 86  SpO2: 97%  Weight: 258 lb (117 kg)    PHYSICAL EXAM: General:  Obese woman NAD HEENT: normal Neck: supple. Hard to assess JVD. Looks ok, Carotids 2+ bilat; no bruits. No lymphadenopathy or thryomegaly appreciated. Cor: PMI nondisplaced. Regular rate & rhythm. No rubs, gallops or murmurs. Lungs: clear Abdomen: markedly obese soft, nontender, nondistended. No bruits or masses. Good bowel sounds. Extremities: no cyanosis, clubbing, rash, edema Neuro: alert & oriented x 3, cranial nerves grossly intact. moves all 4 extremities w/o difficulty. Affect pleasant.  ECG:  NSR with RBBB   ASSESSMENT & PLAN: 1. Chronic diastolic HF --Echo reviewed personally. EF normal. No evidence of adriamycin toxicity. Suspect volume overload due to fluid loading post chemo.  --Volume status much improved with low-dose lasix. Reinforced need for daily weights and reviewed use of sliding scale diuretics. --I explained difference between diastolic and systolic HF. Also explained incidence of Adriamycin cardiotoxicity in detail include small possibility of very delayed toxicity. Will recheck echo in December  2. Hodgkin's lymphoma with LN involvement --Receiving ABD through the beginning of 2018 --Repeat echo in December  2018 3. Morbid Obesity 4. DM2  Claramae Rigdon,MD 10:52 PM

## 2015-12-06 NOTE — Patient Instructions (Signed)
Can you take extra Furosemide (Lasix) as needed  Your physician recommends that you schedule a follow-up appointment in: 6 weeks with echocardiogram

## 2015-12-10 ENCOUNTER — Other Ambulatory Visit (HOSPITAL_COMMUNITY): Payer: Self-pay | Admitting: Oncology

## 2015-12-15 ENCOUNTER — Encounter (HOSPITAL_BASED_OUTPATIENT_CLINIC_OR_DEPARTMENT_OTHER): Payer: 59

## 2015-12-15 ENCOUNTER — Ambulatory Visit: Payer: 59 | Admitting: Internal Medicine

## 2015-12-15 ENCOUNTER — Encounter (HOSPITAL_COMMUNITY): Payer: Self-pay | Admitting: Oncology

## 2015-12-15 ENCOUNTER — Encounter (HOSPITAL_BASED_OUTPATIENT_CLINIC_OR_DEPARTMENT_OTHER): Payer: 59 | Admitting: Oncology

## 2015-12-15 VITALS — BP 111/55 | HR 101 | Temp 98.2°F | Resp 20

## 2015-12-15 DIAGNOSIS — C8119 Nodular sclerosis classical Hodgkin lymphoma, extranodal and solid organ sites: Secondary | ICD-10-CM

## 2015-12-15 DIAGNOSIS — C8118 Nodular sclerosis classical Hodgkin lymphoma, lymph nodes of multiple sites: Secondary | ICD-10-CM | POA: Diagnosis not present

## 2015-12-15 DIAGNOSIS — Z5111 Encounter for antineoplastic chemotherapy: Secondary | ICD-10-CM

## 2015-12-15 DIAGNOSIS — Z5189 Encounter for other specified aftercare: Secondary | ICD-10-CM | POA: Diagnosis not present

## 2015-12-15 LAB — COMPREHENSIVE METABOLIC PANEL
ALBUMIN: 3.8 g/dL (ref 3.5–5.0)
ALT: 40 U/L (ref 14–54)
AST: 47 U/L — AB (ref 15–41)
Alkaline Phosphatase: 43 U/L (ref 38–126)
Anion gap: 11 (ref 5–15)
BUN: 12 mg/dL (ref 6–20)
CHLORIDE: 104 mmol/L (ref 101–111)
CO2: 23 mmol/L (ref 22–32)
CREATININE: 0.83 mg/dL (ref 0.44–1.00)
Calcium: 9.5 mg/dL (ref 8.9–10.3)
GFR calc Af Amer: >60 mL/min (ref >60)
GLUCOSE: 157 mg/dL — AB (ref 65–99)
POTASSIUM: 3.9 mmol/L (ref 3.5–5.1)
SODIUM: 138 mmol/L (ref 135–145)
Total Bilirubin: 0.4 mg/dL (ref 0.3–1.2)
Total Protein: 6.6 g/dL (ref 6.5–8.1)

## 2015-12-15 LAB — CBC WITH DIFFERENTIAL/PLATELET
BASOS ABS: 0.1 10*3/uL (ref 0.0–0.1)
BASOS PCT: 1 %
EOS ABS: 0 10*3/uL (ref 0.0–0.7)
Eosinophils Relative: 0 %
HCT: 35.3 % — ABNORMAL LOW (ref 36.0–46.0)
Hemoglobin: 11 g/dL — ABNORMAL LOW (ref 12.0–15.0)
LYMPHS PCT: 18 %
Lymphs Abs: 1.7 10*3/uL (ref 0.7–4.0)
MCH: 28.6 pg (ref 26.0–34.0)
MCHC: 31.2 g/dL (ref 30.0–36.0)
MCV: 91.7 fL (ref 78.0–100.0)
MONO ABS: 0.6 10*3/uL (ref 0.1–1.0)
Monocytes Relative: 6 %
NEUTROS PCT: 75 %
Neutro Abs: 7.3 10*3/uL (ref 1.7–7.7)
PLATELETS: 224 10*3/uL (ref 150–400)
RBC: 3.85 MIL/uL — AB (ref 3.87–5.11)
RDW: 21.3 % — AB (ref 11.5–15.5)
WBC: 9.7 10*3/uL (ref 4.0–10.5)

## 2015-12-15 MED ORDER — PEGFILGRASTIM 6 MG/0.6ML ~~LOC~~ PSKT
6.0000 mg | PREFILLED_SYRINGE | Freq: Once | SUBCUTANEOUS | Status: AC
  Administered 2015-12-15: 6 mg via SUBCUTANEOUS
  Filled 2015-12-15: qty 0.6

## 2015-12-15 MED ORDER — SODIUM CHLORIDE 0.9 % IV SOLN
Freq: Once | INTRAVENOUS | Status: AC
  Administered 2015-12-15: 11:00:00 via INTRAVENOUS
  Filled 2015-12-15: qty 5

## 2015-12-15 MED ORDER — PALONOSETRON HCL INJECTION 0.25 MG/5ML
0.2500 mg | Freq: Once | INTRAVENOUS | Status: AC
  Administered 2015-12-15: 0.25 mg via INTRAVENOUS
  Filled 2015-12-15: qty 5

## 2015-12-15 MED ORDER — SODIUM CHLORIDE 0.9% FLUSH
10.0000 mL | INTRAVENOUS | Status: DC | PRN
  Administered 2015-12-15: 10 mL
  Filled 2015-12-15: qty 10

## 2015-12-15 MED ORDER — VINBLASTINE SULFATE CHEMO INJECTION 1 MG/ML
6.0000 mg/m2 | Freq: Once | INTRAVENOUS | Status: AC
  Administered 2015-12-15: 13.6 mg via INTRAVENOUS
  Filled 2015-12-15: qty 13.6

## 2015-12-15 MED ORDER — DACARBAZINE 200 MG IV SOLR
375.0000 mg/m2 | Freq: Once | INTRAVENOUS | Status: AC
  Administered 2015-12-15: 860 mg via INTRAVENOUS
  Filled 2015-12-15: qty 3

## 2015-12-15 MED ORDER — SODIUM CHLORIDE 0.9 % IV SOLN
Freq: Once | INTRAVENOUS | Status: AC
  Administered 2015-12-15: 11:00:00 via INTRAVENOUS

## 2015-12-15 MED ORDER — DOXORUBICIN HCL CHEMO IV INJECTION 2 MG/ML
25.0000 mg/m2 | Freq: Once | INTRAVENOUS | Status: AC
  Administered 2015-12-15: 56 mg via INTRAVENOUS
  Filled 2015-12-15: qty 28

## 2015-12-15 MED ORDER — HEPARIN SOD (PORK) LOCK FLUSH 100 UNIT/ML IV SOLN
500.0000 [IU] | Freq: Once | INTRAVENOUS | Status: AC | PRN
  Administered 2015-12-15: 500 [IU]
  Filled 2015-12-15: qty 5

## 2015-12-15 NOTE — Assessment & Plan Note (Addendum)
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  Started ABVD on 09/13/2015 with curative intent with the discontinuation of Bleomycin on 10/11/2015.  PET scan after cycle #3 demonstrated Deauville 3 and therefore 4 more cycles of AVD is recommended.  Course complicated by hospitalization for diastolic congestive heart failure in addition to HIV+ that is well controlled.  Oncology history updated.  Pre-treatment labs today: CBC diff, CMET.  I personally reviewed and went over laboratory results with the patient.  The results are noted within this dictation.  Labs satisfy treatment parameters.  She will get a Neulasta OnPro today.   She saw Dr. Haroldine Laws on 12/06/2015 for consultation: ASSESSMENT & PLAN: 1. Chronic diastolic HF --Echo reviewed personally. EF normal. No evidence of adriamycin toxicity. Suspect volume overload due to fluid loading post chemo.  --Volume status much improved with low-dose lasix. Reinforced need for daily weights and reviewed use of sliding scale diuretics. --I explained difference between diastolic and systolic HF. Also explained incidence of Adriamycin cardiotoxicity in detail include small possibility of very delayed toxicity. Will recheck echo in December  2. Hodgkin's lymphoma with LN involvement --Receiving ABD through the beginning of 2018 --Repeat echo in December 2018  Her abnormal lipid panel is noted and previously discussed with her primary care provider.  For now, the plan is to recheck following completion of treatment.  Return in 2 weeks for follow-up and treatment.

## 2015-12-15 NOTE — Progress Notes (Signed)
Holly Stephens tolerated chemo tx well without complaints or incident. Labs reviewed with [name] PA prior to administering chemo. VSS upon discharge Neulasta on-pro applied to right arm with green light flashing and should be infused by 5:30 tomorrow evening. Pt discharged self ambulatory in satisfactory condition with friend

## 2015-12-15 NOTE — Progress Notes (Signed)
Holly Neighbors, MD [place] [address]  Nodular sclerosis [name] of extranodal site excluding spleen and other solid organs [facility])  CURRENT THERAPY: AVD due to adverse reaction to bleomycin, beginning on 09/13/2015.  INTERVAL HISTORY: Holly Stephens 60 y.o. female returns for followup of Hodgkin's Lymphoma, nodular sclerosing-type, classical Hodgkin's Disease with lymphatic involvement in multiple lymph nodes superior and inferior to the diaphragm with PET imaging demonstrating findings concerning for pulmonary involvement (Stage III versus Stage IV disease ?pulmonary nodules x 2?).  Started ABVD on 09/13/2015 with curative intent with the discontinuation of Bleomycin on 10/11/2015.  PET scan after cycle #3 demonstrated Deauville 3 and therefore 4 more cycles of AVD is recommended.  Course complicated by hospitalization for diastolic congestive heart failure in addition to HIV+ that is well controlled.    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  Positive CD30, CD15, andPAX-5.  Negative CD45 and CD20.      08/31/2015 PET scan    Hypermetabolic lymphadenopathy. Nodal stations include: L supraclavicular nodes, L axillary nodes, mediastinal lymph nodes, periaortic retroperitoneal and periportal lymph nodes as well as L iliac and inguinal lymph nodes. Two concerning pulm nodules...      09/01/2015 Imaging    MUGA- Normal LEFT ventricular ejection fraction of 73%.  Normal LEFT ventricular wall motion.      09/01/2015 Procedure    PFTs-  Spirometry shows mild ventilatory defect without airflow obstruction and no bronchodilator improvement. Lung volumes are normal. Airway resistance is normal. DLCO normal. Essentially normal lung function      09/08/2015 Procedure    Port placed by Dr. Rosana Hoes      09/13/2015 -   Chemotherapy    ABVD      09/27/2015 Imaging    CT angio of chest- no gross PE seen. Improving axillary, mediastinal and upper abdominal adenopathy. Improved and possibly resolved RUL nodule and with resolved or obscured LLL nodule. Small L pleural effusion and mild L lower lobe atelectasis.      10/11/2015 Treatment Plan Change    Bleomycin discontinued       11/01/2015 PET scan    Clear interval response to therapy with decrease in size and hypermetabolism of larger lymph nodes seen previously and some of the smaller hypermetabolic lymph node seen on the previous study have resolved completely in terms of abnormal enlargement and hypermetabolism. 2. Slight decrease in left lower lobe pulmonary nodule with interval resolution of the right upper lobe pulmonary nodule. 3. Stable appearance of mild heterogeneous FDG accumulation within the marrow space.      11/01/2015 Miscellaneous    Deauville 3- 4 more cycles of AVD recommended according to NCCN guidelines.       She is doing well.  She notes improvement in her breathing.  She only uses O2 after exertion.    She saw Dr. Haroldine Stephens and she reports an excellent visit with him and we've reviewed his note.    Of note, her lipid panel is significantly changed and this is discussed with her primary care provider.  For now, we will hold off on any changes in her medication regimen and re-address post-treatment.  Cause of this is unclear, but there are multiple possibilities.  Review of Systems  Constitutional: Negative.  Negative for chills, fever  and weight loss.  HENT: Negative.   Eyes: Negative.   Respiratory: Negative.  Negative for cough, hemoptysis, sputum production, shortness of breath and wheezing.   Cardiovascular: Negative.  Negative for chest pain.  Gastrointestinal: Negative.  Negative for nausea and vomiting.  Genitourinary: Negative.   Musculoskeletal: Negative.   Skin: Negative.   Neurological: Negative.  Negative for  weakness.  Endo/Heme/Allergies: Negative.   Psychiatric/Behavioral: Negative.     Past Medical History:  Diagnosis Date  . Arthritis    osteoarthritis  . Diabetes mellitus without complication ([place])   . HIV infection ([place])   . [name], nodular sclerosis ([place]) 08/23/2015  . Human immunodeficiency virus (HIV) disease ([place]) 02/23/2006   Qualifier: Diagnosis of  By: Megan Salon MD, Holly      Past Surgical History:  Procedure Laterality Date  . AXILLARY LYMPH NODE BIOPSY Left 07/23/2015   Procedure: EXCISIONAL BIOPSY LEFT AXILLARY LYMPH NODE;  Surgeon: [name], MD;  Location: [facility];  Service: General;  Laterality: Left;  . PORTACATH PLACEMENT Right 09/08/2015   Procedure: INSERTION OF CENTRAL VENOUS CATHETER WITH PORT FOR CHEMOTHERAPY;  Surgeon: [name], MD;  Location: [facility];  Service: General;  Laterality: Right;  . SHOULDER SURGERY Right    removal of bone spur    Family History  Problem Relation Age of Onset  . Hypertension Mother   . Cancer Mother 52    breast   . Hypertension Father   . Heart attack Father   . Pulmonary embolism Sister   . Parkinson's disease Brother     Social History   Social History  . Marital status: Widowed    Spouse name: N/A  . Number of children: N/A  . Years of education: N/A   Social History Main Topics  . Smoking status: Never Smoker  . Smokeless tobacco: Never Used  . Alcohol use No  . Drug use: No  . Sexual activity: No     Comment: declined condoms   Other Topics Concern  . None   Social History Narrative  . None     PHYSICAL EXAMINATION  ECOG PERFORMANCE STATUS: 1 - Symptomatic but completely ambulatory  Vitals:   12/15/15 0900  BP: (!) 148/58  Pulse: 98  Resp: 20  Temp: 97.7 F (36.5 C)     GENERAL:alert, no distress, well nourished, well developed, comfortable, cooperative, obese, smiling, in chemo-bed, and accompanied by friend. SKIN: skin color, texture, turgor are normal, no rashes or  significant lesions HEAD: Normocephalic, No masses, lesions, tenderness or abnormalities EYES: normal, EOMI, Conjunctiva are pink and non-injected EARS: External ears normal OROPHARYNX:lips, buccal mucosa, and tongue normal and mucous membranes are moist  NECK: supple, thyroid normal size, non-tender, without nodularity, trachea midline LYMPH:  no palpable lymphadenopathy BREAST:not examined LUNGS: clear to auscultation  HEART: regular rate & rhythm, no murmurs, no gallops, S1 normal and S2 normal ABDOMEN:abdomen soft, non-tender, obese and normal bowel sounds BACK: Back symmetric, no curvature., No CVA tenderness EXTREMITIES:less then 2 second capillary refill, no joint deformities, effusion, or inflammation, no edema, no skin discoloration, no cyanosis  NEURO: alert & oriented x 3 with fluent speech, no focal motor/sensory deficits, gait normal   LABORATORY DATA: CBC    Component Value Date/Time   WBC 9.7 12/15/2015 0925   RBC 3.85 (L) 12/15/2015 0925   HGB 11.0 (L) 12/15/2015 0925   HCT 35.3 (L) 12/15/2015 0925   PLT 224 12/15/2015 0925   MCV 91.7 12/15/2015 0925  MCH 28.6 12/15/2015 0925   MCHC 31.2 12/15/2015 0925   RDW 21.3 (H) 12/15/2015 0925   LYMPHSABS PENDING 12/15/2015 0925   MONOABS PENDING 12/15/2015 0925   EOSABS PENDING 12/15/2015 0925   BASOSABS PENDING 12/15/2015 0925      Chemistry      Component Value Date/Time   NA 138 12/15/2015 0925   K 3.9 12/15/2015 0925   CL 104 12/15/2015 0925   CO2 23 12/15/2015 0925   BUN 12 12/15/2015 0925   CREATININE 0.83 12/15/2015 0925   CREATININE 0.57 12/02/2015 1008      Component Value Date/Time   CALCIUM 9.5 12/15/2015 0925   ALKPHOS 43 12/15/2015 0925   AST 47 (H) 12/15/2015 0925   ALT 40 12/15/2015 0925   BILITOT 0.4 12/15/2015 0925        PENDING LABS:   RADIOGRAPHIC STUDIES:  No results found.   PATHOLOGY:    ASSESSMENT AND PLAN:  [name], nodular sclerosis ([facility]) Hodgkin's  Lymphoma, nodular sclerosing-type, classical Hodgkin's Disease with lymphatic involvement in multiple lymph nodes superior and inferior to the diaphragm with PET imaging demonstrating findings concerning for pulmonary involvement (Stage III versus Stage IV disease ?pulmonary nodules x 2?).  Started ABVD on 09/13/2015 with curative intent with the discontinuation of Bleomycin on 10/11/2015.  PET scan after cycle #3 demonstrated Deauville 3 and therefore 4 more cycles of AVD is recommended.  Course complicated by hospitalization for diastolic congestive heart failure in addition to HIV+ that is well controlled.  Oncology history updated.  Pre-treatment labs today: CBC diff, CMET.  I personally reviewed and went over laboratory results with the patient.  The results are noted within this dictation.  Labs satisfy treatment parameters.  She will get a Neulasta OnPro today.   She saw Dr. Haroldine Stephens on 12/06/2015 for consultation: ASSESSMENT & PLAN: 1. Chronic diastolic HF --Echo reviewed personally. EF normal. No evidence of adriamycin toxicity. Suspect volume overload due to fluid loading post chemo.  --Volume status much improved with low-dose lasix. Reinforced need for daily weights and reviewed use of sliding scale diuretics. --I explained difference between diastolic and systolic HF. Also explained incidence of Adriamycin cardiotoxicity in detail include small possibility of very delayed toxicity. Will recheck echo in December  2. Hodgkin's lymphoma with LN involvement --Receiving ABD through the beginning of 2018 --Repeat echo in December 2018  Her abnormal lipid panel is noted and previously discussed with her primary care provider.  For now, the plan is to recheck following completion of treatment.  Return in 2 weeks for follow-up and treatment.   ORDERS PLACED FOR THIS ENCOUNTER: No orders of the defined types were placed in this encounter.   MEDICATIONS PRESCRIBED THIS ENCOUNTER: No orders  of the defined types were placed in this encounter.   THERAPY PLAN:  Continue therapy as planned for 4 cycles post-PET imaging.  All questions were answered. The patient knows to call the clinic with any problems, questions or concerns. We can certainly see the patient much sooner if necessary.  Patient and plan discussed with Dr. Ancil Linsey and she is in agreement with the aforementioned.   This note is electronically signed by: Doy Mince 12/15/2015 10:16 AM

## 2015-12-15 NOTE — Patient Instructions (Signed)
[facility] Discharge Instructions for Patients Receiving Chemotherapy   Beginning January 23rd 2017 lab work for the [facility] will be done in the  Main lab at [facility] on 1st floor. If you have a lab appointment with the [place] please come in thru the  Main Entrance and check in at the main information desk   Today you received the following chemotherapy agents Adriamycin,Vinblastin and DTIC as well as Neulasta on-pro. Follow-up as scheduled. Call clinic for any questions or concerns  To help prevent nausea and vomiting after your treatment, we encourage you to take your nausea medication   If you develop nausea and vomiting, or diarrhea that is not controlled by your medication, call the clinic.  The clinic phone number is [phone]. Office hours are Monday-Friday 8:30am-5:00pm.  BELOW ARE SYMPTOMS THAT SHOULD BE REPORTED IMMEDIATELY:  *FEVER GREATER THAN 101.0 F  *CHILLS WITH OR WITHOUT FEVER  NAUSEA AND VOMITING THAT IS NOT CONTROLLED WITH YOUR NAUSEA MEDICATION  *UNUSUAL SHORTNESS OF BREATH  *UNUSUAL BRUISING OR BLEEDING  TENDERNESS IN MOUTH AND THROAT WITH OR WITHOUT PRESENCE OF ULCERS  *URINARY PROBLEMS  *BOWEL PROBLEMS  UNUSUAL RASH Items with * indicate a potential emergency and should be followed up as soon as possible. If you have an emergency after office hours please contact your primary care physician or go to the nearest emergency department.  Please call the clinic during office hours if you have any questions or concerns.   You may also contact the Patient Navigator at [phone] should you have any questions or need assistance in obtaining follow up care.      Resources For Cancer Patients and their Caregivers ? American Cancer Society: Can assist with transportation, wigs, general needs, runs Look Good Feel Better.        6515270881 ? Cancer Care: Provides financial assistance, online support groups,  medication/co-pay assistance.  1-800-813-HOPE 8546381532) ? [facility] Assists [place] cancer patients and their families through emotional , educational and financial support.  419-350-8585 ? [place] DSS Where to apply for food stamps, Medicaid and utility assistance. 561-617-1213 ? RCATS: Transportation to medical appointments. 215-517-0936 ? Social Security Administration: May apply for disability if have a Stage IV cancer. [phone] [phone] ? [employer], Disability and Transit Services: Assists with nutrition, care and transit needs. [phone]

## 2015-12-15 NOTE — Patient Instructions (Addendum)
[place] at [facility] Discharge Instructions  RECOMMENDATIONS MADE BY THE CONSULTANT AND ANY TEST RESULTS WILL BE SENT TO YOUR REFERRING PHYSICIAN.  You were seen today by [name] PA-C. Follow up in 2 weeks.    Thank you for choosing [place] at [facility] to provide your oncology and hematology care.  To afford each patient quality time with our provider, please arrive at least 15 minutes before your scheduled appointment time.   Beginning January 23rd 2017 lab work for the [facility] will be done in the  Main lab at [facility] on 1st floor. If you have a lab appointment with the [facility] please come in thru the  Main Entrance and check in at the main information desk  You need to re-schedule your appointment should you arrive 10 or more minutes late.  We strive to give you quality time with our providers, and arriving late affects you and other patients whose appointments are after yours.  Also, if you no show three or more times for appointments you may be dismissed from the clinic at the providers discretion.     Again, thank you for choosing [facility].  Our hope is that these requests will decrease the amount of time that you wait before being seen by our physicians.       _____________________________________________________________  Should you have questions after your visit to [facility], please contact our office at [phone] between the hours of 8:30 a.m. and 4:30 p.m.  Voicemails left after 4:30 p.m. will not be returned until the following business day.  For prescription refill requests, have your pharmacy contact our office.         Resources For Cancer Patients and their Caregivers ? American Cancer Society: Can assist with transportation, wigs, general needs, runs Look Good Feel Better.        4305654299 ? Cancer Care: Provides financial assistance, online support  groups, medication/co-pay assistance.  1-800-813-HOPE 320 842 9160) ? [facility] Assists [place] cancer patients and their families through emotional , educational and financial support.  252-235-7326 ? [place] DSS Where to apply for food stamps, Medicaid and utility assistance. 781-879-4061 ? RCATS: Transportation to medical appointments. [phone] ? Social Security Administration: May apply for disability if have a Stage IV cancer. 8180191678 857-472-5614 ? [employer], Disability and Transit Services: Assists with nutrition, care and transit needs. [place] Support Programs: @10RELATIVEDAYS @ > Cancer Support Group  2nd Tuesday of the month 1pm-2pm, Journey Room  > Creative Journey  3rd Tuesday of the month 1130am-1pm, Journey Room  > Look Good Feel Better  1st Wednesday of the month 10am-12 noon, Journey Room (Call [place] to register [phone])

## 2015-12-16 ENCOUNTER — Ambulatory Visit (INDEPENDENT_AMBULATORY_CARE_PROVIDER_SITE_OTHER): Payer: 59 | Admitting: Internal Medicine

## 2015-12-16 ENCOUNTER — Encounter: Payer: Self-pay | Admitting: Internal Medicine

## 2015-12-16 DIAGNOSIS — B20 Human immunodeficiency virus [HIV] disease: Secondary | ICD-10-CM

## 2015-12-16 DIAGNOSIS — R634 Abnormal weight loss: Secondary | ICD-10-CM | POA: Diagnosis not present

## 2015-12-16 DIAGNOSIS — I5031 Acute diastolic (congestive) heart failure: Secondary | ICD-10-CM

## 2015-12-16 MED ORDER — EMTRICITAB-RILPIVIR-TENOFOV AF 200-25-25 MG PO TABS: 1.0000 | ORAL_TABLET | Freq: Every day | ORAL | 11 refills | Status: DC

## 2015-12-16 MED ORDER — EMTRICITABINE-TENOFOVIR AF 200-25 MG PO TABS: 1.0000 | ORAL_TABLET | Freq: Every day | ORAL | 11 refills | Status: DC

## 2015-12-16 MED ORDER — DOLUTEGRAVIR SODIUM 50 MG PO TABS: 50.0000 mg | ORAL_TABLET | Freq: Every day | ORAL | 11 refills | Status: DC

## 2015-12-16 NOTE — Assessment & Plan Note (Signed)
Her CD4 count is down some due to chemotherapy but her viral load remains undetectable. Since she will remain on chemotherapy for several more months I will change her antiretroviral regimen to [facility] and Tivicay to lessen the chance of drug drug interactions with her chemotherapy. She will follow-up in 3 months.

## 2015-12-16 NOTE — Assessment & Plan Note (Signed)
She has had a 40 pound unintentional weight loss related to chemotherapy. She has not completely unhappy about this. This is due to anorexia and a metallic taste in her mouth. She is not having any nausea, vomiting or diarrhea.

## 2015-12-16 NOTE — Progress Notes (Signed)
This is for documentation of the patient's resistance profile per [facility]. Original documentation is from 11/06/08.   Protease Resistance: Other Mutations: L63P Protease Inhibitors  atazanavir/r (ATV/r) Susceptible  darunavir/r (DRV/r) Susceptible  lopinavir/r (LPV/r) Susceptible   Reverse Transcriptase Resistance:  NRTI Resistance Mutations: K70R, K219Q NNRTI Resistance Mutations: None Other Mutations: T69NS Nucleoside Reverse Transcriptase Inhibitors  abacavir (ABC) Potential Low-Level Resistance  zidovudine (AZT) Intermediate Resistance  emtricitabine (FTC) Susceptible  lamivudine (3TC) Susceptible  tenofovir (TDF) Potential Low-Level Resistance   Non-nucleoside Reverse Transcriptase Inhibitors  efavirenz (EFV) Susceptible  etravirine (ETR) Susceptible  nevirapine (NVP) Susceptible  rilpivirine (RPV) Susceptible    Previous ART regimens: Stavudine, lamivudine , fosamprenavir, ritonavir   2008- 2010 Combivir, fosamprenavir, ritonavir 2010-2011 Combivir, darunavir, ritonavir 2011- 2014 Darunavir/ritonavir, lamivudine, dolutegravir 2014- 2016 [place], [place]- 2016-

## 2015-12-16 NOTE — Assessment & Plan Note (Signed)
She is doing daily self-monitoring at home and understands her treatment plan.

## 2015-12-16 NOTE — Progress Notes (Signed)
Patient Active Problem List   Diagnosis Date Noted  . Human immunodeficiency virus (HIV) disease (Holly Stephens) 02/23/2006    Priority: High  . Unintentional weight loss 12/16/2015  . Congestive heart failure with cardiomyopathy (Holly Stephens) 10/27/2015  . Acute congestive heart failure (Holly Stephens) 10/27/2015  . 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 Visual floaters 09/12/2011  . Episodic low back pain 11/15/2010  . ROTATOR CUFF INJURY, RIGHT SHOULDER 11/03/2008  . ADHESIVE CAPSULITIS OF SHOULDER 11/12/2007  . OBESITY NOS 05/17/2006  . SYNDROME, CARPAL TUNNEL 05/17/2006  . FOOT PAIN 05/17/2006  . Hyperlipidemia 02/23/2006  . DEPRESSION 02/23/2006  . Essential hypertension 02/23/2006    Patient's Medications  New Prescriptions   DOLUTEGRAVIR (TIVICAY) 50 MG TABLET    Take 1 tablet (50 mg total) by mouth daily.   EMTRICITABINE-RILPIVIR-TENOFOVIR AF (ODEFSEY) 200-25-25 MG TABS TABLET    Take 1 tablet by mouth daily.  Previous Medications   ACETAMINOPHEN (TYLENOL) 650 MG CR TABLET    Take 650 mg by mouth every 8 (eight) hours as needed for pain.   CHOLECALCIFEROL (VITAMIN D) 1000 UNITS TABLET    Take 2,000 Units by mouth daily.   DACARBAZINE (DTIC) 200 MG CHEMO INJECTION    Inject into the vein every 14 (fourteen) days.    DEXAMETHASONE (DECADRON) 4 MG TABLET    Take 8 mg by mouth 2 (two) times daily with a meal. Pt is to take for three days after chemo.   DICLOFENAC SODIUM (PENNSAID) 2 % SOLN    Place 2 application onto the skin 2 (two) times daily as needed (for pain).    DOXORUBICIN (ADRIAMYCIN) 50 MG CHEMO INJECTION    Inject 60 mg into the vein every 14 (fourteen) days.    FENOFIBRATE 160 MG TABLET    Take 160 mg by mouth daily.    FUROSEMIDE (LASIX) 40 MG TABLET    Take 0.5 tablets (20 mg total) by mouth daily.   GARLIC 123XX123 MG CAPS    Take 1,000 mg by mouth daily.    JANUMET 50-500 MG PER TABLET    Take 1 tablet by mouth 2 (two) times daily with a meal.    LIDOCAINE-PRILOCAINE (EMLA) CREAM    Apply 1 application topically as needed (prior to accessing port).   LORATADINE (CLARITIN) 10 MG TABLET    Take 10 mg by mouth See admin instructions. Pt only takes the day before chemo and five days after.   LORAZEPAM (ATIVAN) 0.5 MG TABLET    Take 0.5 mg by mouth every 8 (eight) hours as needed for anxiety.   METOPROLOL TARTRATE (LOPRESSOR) 25 MG TABLET    Take 1 tablet (25 mg total) by mouth daily.   MULTIPLE VITAMIN (MULTIVITAMIN WITH MINERALS) TABS TABLET    Take 1 tablet by mouth daily.   ONDANSETRON (ZOFRAN) 8 MG TABLET       OXYCODONE-ACETAMINOPHEN (ROXICET) 5-325 MG TABLET    Take 1 tablet by mouth every 4 (four) hours as needed for severe pain.   PANTOPRAZOLE (PROTONIX) 40 MG TABLET    Take 1 tablet (40 mg total) by mouth daily.   POTASSIUM CHLORIDE SA (K-DUR,KLOR-CON) 20 MEQ TABLET    Take 1 tablet (20 mEq total) by mouth 3 (three) times daily.   PROCHLORPERAZINE (COMPAZINE) 10 MG TABLET    Take 1 tablet (10 mg total) by mouth every 6 (six) hours as needed for nausea or vomiting.   SPIRONOLACTONE (ALDACTONE) 50 MG  TABLET       VINBLASTINE IN SODIUM CHLORIDE 0.9 % 50 ML    Inject into the vein every 14 (fourteen) days.   Modified Medications   No medications on file  Discontinued Medications   DARUNAVIR ETHANOLATE ([name]) 800 MG TABLET    Take 1 tablet (800 mg total) by mouth daily with breakfast.   ELVITEGRAVIR-COBICISTAT-EMTRICITABINE-TENOFOVIR ([name]) 150-150-200-10 MG TABS TABLET    Take 1 tablet by mouth daily with breakfast.   METOPROLOL (LOPRESSOR) 50 MG TABLET        Subjective: Holly Stephens is in for her routine HIV follow-up visit. 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 She has had anorexia and has lost significant weight since starting chemotherapy. She also developed heart failure. Her bleomycin was stopped in  August. Her echocardiogram showed diastolic dysfunction but preserved ejection fraction and it was felt that she was probably having volume overload due to all of the fluid that was being given with her chemotherapy. She is feeling better. She monitors her weight and pedal edema at home and knows to take more diuretic if she starts to gain fluid weight. She has not been able to return to work. She has not missed any doses of her [name] or [name].   Review of Systems: Review of Systems  Constitutional: Positive for malaise/fatigue and weight loss. Negative for chills, diaphoresis and fever.  HENT: Negative for sore throat.   Respiratory: Positive for shortness of breath. Negative for cough and sputum production.   Cardiovascular: Negative for chest pain.  Gastrointestinal: Negative for abdominal pain, diarrhea, heartburn, nausea and vomiting.  Genitourinary: Negative for dysuria.  Musculoskeletal: Negative for joint pain and myalgias.  Skin: Negative for rash.  Neurological: Negative for dizziness and headaches.  Psychiatric/Behavioral: Negative for depression and substance abuse. The patient is not nervous/anxious.     Past Medical History:  Diagnosis Date  . Arthritis    osteoarthritis  . Diabetes mellitus without complication (Holly Stephens)   . HIV infection (Holly Stephens)   . 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 Human immunodeficiency virus (HIV) disease (Holly Stephens) 02/23/2006   Qualifier: Diagnosis of  By: Megan Salon MD, Bryndle Corredor      Social History  Substance Use Topics  . Smoking status: Never Smoker  . Smokeless tobacco: Never Used  . Alcohol use No    Family History  Problem Relation Age of Onset  . Hypertension Mother   . Cancer Mother 65    breast   . Hypertension Father   . Heart attack Father   . Pulmonary embolism Sister   . Parkinson's disease Brother     Allergies  Allergen Reactions  . Augmentin [Amoxicillin-Pot Clavulanate] Hives and Other (See Comments)    Has  patient had a PCN reaction causing immediate rash, facial/tongue/throat swelling, SOB or lightheadedness with hypotension: No Has patient had a PCN reaction causing severe rash involving mucus membranes or skin necrosis: No Has patient had a PCN reaction that required hospitalization No Has patient had a PCN reaction occurring within the last 10 years: No If all of the above answers are "NO", then may proceed with Cephalosporin use.    Objective:  Vitals:   12/16/15 1417  BP: (!) 161/80  Pulse: (!) 106  Temp: 97.6 F (36.4 C)  TempSrc: Oral  SpO2: 96%  Weight: 255 lb 8 oz (115.9 kg)   Body mass index is 48.28 kg/m.  Physical Exam  Constitutional: She is oriented to person, place, and time.  She is in good spirits. Her weight is down about 40 pounds since starting chemotherapy.  HENT:  Mouth/Throat: No oropharyngeal exudate.  Eyes: Conjunctivae are normal.  Cardiovascular: Normal rate and regular rhythm.   No murmur heard. Pulmonary/Chest: Effort normal and breath sounds normal. She has no wheezes. She has no rales.  Abdominal: Soft. She exhibits no mass. There is no tenderness.  Obese.  Musculoskeletal: Normal range of motion.  Neurological: She is alert and oriented to person, place, and time. Gait normal.  Skin: No rash noted.  Psychiatric: Mood and affect normal.    Lab Results Lab Results  Component Value Date   WBC 9.7 12/15/2015   HGB 11.0 (L) 12/15/2015   HCT 35.3 (L) 12/15/2015   MCV 91.7 12/15/2015   PLT 224 12/15/2015    Lab Results  Component Value Date   CREATININE 0.83 12/15/2015   BUN 12 12/15/2015   NA 138 12/15/2015   K 3.9 12/15/2015   CL 104 12/15/2015   CO2 23 12/15/2015    Lab Results  Component Value Date   ALT 40 12/15/2015   AST 47 (H) 12/15/2015   ALKPHOS 43 12/15/2015   BILITOT 0.4 12/15/2015    Lab Results  Component Value Date   CHOL 228 (H) 06/01/2015   HDL 38 (L) 06/01/2015   LDLCALC 158 (H) 06/01/2015   TRIG 162 (H)  06/01/2015   CHOLHDL 6.0 (H) 06/01/2015   HIV 1 RNA Quant (copies/mL)  Date Value  12/02/2015 <20  06/01/2015 <20  11/24/2014 <20   CD4 T Cell Abs (/uL)  Date Value  12/02/2015 290 (L)  09/27/2015 810  06/01/2015 790     Problem List Items Addressed This Visit      High   Human immunodeficiency virus (HIV) disease ([place])    Her CD4 count is down some due to chemotherapy but her viral load remains undetectable. Since she will remain on chemotherapy for several more months I will change her antiretroviral regimen to [facility] and Tivicay to lessen the chance of drug drug interactions with her chemotherapy. She will follow-up in 3 months.      Relevant Medications   dolutegravir (TIVICAY) 50 MG tablet   emtricitabine-rilpivir-tenofovir AF (ODEFSEY) 200-25-25 MG TABS tablet     Unprioritized   Acute congestive heart failure ([place])    She is doing daily self-monitoring at home and understands her treatment plan.      Unintentional weight loss    She has had a 40 pound unintentional weight loss related to chemotherapy. She has not completely unhappy about this. This is due to anorexia and a metallic taste in her mouth. She is not having any nausea, vomiting or diarrhea.        Other Visit Diagnoses   None.       Michel Bickers, MD [facility] for [facility] [phone] pager   [phone] cell 12/16/2015, 2:59 PM

## 2015-12-17 ENCOUNTER — Other Ambulatory Visit: Payer: Self-pay | Admitting: *Deleted

## 2015-12-17 DIAGNOSIS — B20 Human immunodeficiency virus [HIV] disease: Secondary | ICD-10-CM

## 2015-12-17 MED ORDER — DOLUTEGRAVIR SODIUM 50 MG PO TABS: 50.0000 mg | ORAL_TABLET | Freq: Every day | ORAL | 0 refills | Status: DC

## 2015-12-17 MED ORDER — EMTRICITAB-RILPIVIR-TENOFOV AF 200-25-25 MG PO TABS: 1.0000 | ORAL_TABLET | Freq: Every day | ORAL | 0 refills | Status: DC

## 2015-12-17 NOTE — Telephone Encounter (Signed)
Wants new rxes sent to [facility] too.

## 2015-12-20 ENCOUNTER — Other Ambulatory Visit: Payer: Self-pay | Admitting: Pharmacist

## 2015-12-29 ENCOUNTER — Encounter (HOSPITAL_COMMUNITY): Payer: 59 | Attending: Hematology & Oncology

## 2015-12-29 VITALS — BP 127/58 | HR 100 | Temp 98.0°F | Resp 20 | Wt 256.6 lb

## 2015-12-29 DIAGNOSIS — Z5111 Encounter for antineoplastic chemotherapy: Secondary | ICD-10-CM | POA: Diagnosis not present

## 2015-12-29 DIAGNOSIS — C8118 Nodular sclerosis classical Hodgkin lymphoma, lymph nodes of multiple sites: Secondary | ICD-10-CM | POA: Insufficient documentation

## 2015-12-29 DIAGNOSIS — C8119 Nodular sclerosis classical Hodgkin lymphoma, extranodal and solid organ sites: Secondary | ICD-10-CM

## 2015-12-29 DIAGNOSIS — Z5189 Encounter for other specified aftercare: Secondary | ICD-10-CM | POA: Diagnosis not present

## 2015-12-29 LAB — CBC WITH DIFFERENTIAL/PLATELET
BASOS PCT: 1 %
Basophils Absolute: 0.2 10*3/uL — ABNORMAL HIGH (ref 0.0–0.1)
EOS PCT: 0 %
Eosinophils Absolute: 0 10*3/uL (ref 0.0–0.7)
HEMATOCRIT: 34.4 % — AB (ref 36.0–46.0)
HEMOGLOBIN: 10.7 g/dL — AB (ref 12.0–15.0)
LYMPHS ABS: 2.3 10*3/uL (ref 0.7–4.0)
Lymphocytes Relative: 13 %
MCH: 29.3 pg (ref 26.0–34.0)
MCHC: 31.1 g/dL (ref 30.0–36.0)
MCV: 94.2 fL (ref 78.0–100.0)
MONO ABS: 0.9 10*3/uL (ref 0.1–1.0)
MONOS PCT: 5 %
NEUTROS ABS: 14.2 10*3/uL — AB (ref 1.7–7.7)
Neutrophils Relative %: 81 %
Platelets: 140 10*3/uL — ABNORMAL LOW (ref 150–400)
RBC: 3.65 MIL/uL — ABNORMAL LOW (ref 3.87–5.11)
RDW: 22.4 % — AB (ref 11.5–15.5)
WBC: 17.6 10*3/uL — AB (ref 4.0–10.5)

## 2015-12-29 LAB — COMPREHENSIVE METABOLIC PANEL
ALBUMIN: 3.7 g/dL (ref 3.5–5.0)
ALK PHOS: 62 U/L (ref 38–126)
ALT: 40 U/L (ref 14–54)
ANION GAP: 9 (ref 5–15)
AST: 44 U/L — ABNORMAL HIGH (ref 15–41)
BILIRUBIN TOTAL: 0.3 mg/dL (ref 0.3–1.2)
BUN: 9 mg/dL (ref 6–20)
CALCIUM: 9.6 mg/dL (ref 8.9–10.3)
CO2: 23 mmol/L (ref 22–32)
Chloride: 107 mmol/L (ref 101–111)
Creatinine, Ser: 0.75 mg/dL (ref 0.44–1.00)
GLUCOSE: 144 mg/dL — AB (ref 65–99)
POTASSIUM: 3.5 mmol/L (ref 3.5–5.1)
Sodium: 139 mmol/L (ref 135–145)
TOTAL PROTEIN: 6.4 g/dL — AB (ref 6.5–8.1)

## 2015-12-29 MED ORDER — SODIUM CHLORIDE 0.9 % IV SOLN
375.0000 mg/m2 | Freq: Once | INTRAVENOUS | Status: AC
  Administered 2015-12-29: 860 mg via INTRAVENOUS
  Filled 2015-12-29: qty 43

## 2015-12-29 MED ORDER — VINBLASTINE SULFATE CHEMO INJECTION 1 MG/ML
6.0000 mg/m2 | Freq: Once | INTRAVENOUS | Status: AC
  Administered 2015-12-29: 13.6 mg via INTRAVENOUS
  Filled 2015-12-29: qty 10

## 2015-12-29 MED ORDER — PEGFILGRASTIM 6 MG/0.6ML ~~LOC~~ PSKT
6.0000 mg | PREFILLED_SYRINGE | Freq: Once | SUBCUTANEOUS | Status: AC
  Administered 2015-12-29: 6 mg via SUBCUTANEOUS
  Filled 2015-12-29: qty 0.6

## 2015-12-29 MED ORDER — PALONOSETRON HCL INJECTION 0.25 MG/5ML
0.2500 mg | Freq: Once | INTRAVENOUS | Status: AC
  Administered 2015-12-29: 0.25 mg via INTRAVENOUS
  Filled 2015-12-29: qty 5

## 2015-12-29 MED ORDER — SODIUM CHLORIDE 0.9 % IV SOLN
Freq: Once | INTRAVENOUS | Status: AC
  Administered 2015-12-29: 11:00:00 via INTRAVENOUS

## 2015-12-29 MED ORDER — HEPARIN SOD (PORK) LOCK FLUSH 100 UNIT/ML IV SOLN
500.0000 [IU] | Freq: Once | INTRAVENOUS | Status: AC | PRN
  Administered 2015-12-29: 500 [IU]
  Filled 2015-12-29: qty 5

## 2015-12-29 MED ORDER — SODIUM CHLORIDE 0.9 % IV SOLN
Freq: Once | INTRAVENOUS | Status: AC
  Administered 2015-12-29: 12:00:00 via INTRAVENOUS
  Filled 2015-12-29: qty 5

## 2015-12-29 MED ORDER — SODIUM CHLORIDE 0.9% FLUSH
10.0000 mL | INTRAVENOUS | Status: DC | PRN
  Administered 2015-12-29: 10 mL
  Filled 2015-12-29: qty 10

## 2015-12-29 MED ORDER — DOXORUBICIN HCL CHEMO IV INJECTION 2 MG/ML
25.0000 mg/m2 | Freq: Once | INTRAVENOUS | Status: AC
  Administered 2015-12-29: 56 mg via INTRAVENOUS
  Filled 2015-12-29: qty 28

## 2015-12-29 NOTE — Patient Instructions (Signed)
[facility] Discharge Instructions for Patients Receiving Chemotherapy   Beginning January 23rd 2017 lab work for the [facility] will be done in the  Main lab at [facility] on 1st floor. If you have a lab appointment with the [place] please come in thru the  Main Entrance and check in at the main information desk   Today you received the following chemotherapy agents Adriamycin,Vinblastine and DTIC as well as Neulasta on-pro. Follow-up as scheduled. Call clinic for any questions or concerns  To help prevent nausea and vomiting after your treatment, we encourage you to take your nausea medication    If you develop nausea and vomiting, or diarrhea that is not controlled by your medication, call the clinic.  The clinic phone number is [phone]. Office hours are Monday-Friday 8:30am-5:00pm.  BELOW ARE SYMPTOMS THAT SHOULD BE REPORTED IMMEDIATELY:  *FEVER GREATER THAN 101.0 F  *CHILLS WITH OR WITHOUT FEVER  NAUSEA AND VOMITING THAT IS NOT CONTROLLED WITH YOUR NAUSEA MEDICATION  *UNUSUAL SHORTNESS OF BREATH  *UNUSUAL BRUISING OR BLEEDING  TENDERNESS IN MOUTH AND THROAT WITH OR WITHOUT PRESENCE OF ULCERS  *URINARY PROBLEMS  *BOWEL PROBLEMS  UNUSUAL RASH Items with * indicate a potential emergency and should be followed up as soon as possible. If you have an emergency after office hours please contact your primary care physician or go to the nearest emergency department.  Please call the clinic during office hours if you have any questions or concerns.   You may also contact the Patient Navigator at [phone] should you have any questions or need assistance in obtaining follow up care.      Resources For Cancer Patients and their Caregivers ? American Cancer Society: Can assist with transportation, wigs, general needs, runs Look Good Feel Better.        628-414-1221 ? Cancer Care: Provides financial assistance, online support groups,  medication/co-pay assistance.  1-800-813-HOPE 7853488652) ? [place] Assists [place] cancer patients and their families through emotional , educational and financial support.  [phone] ? [place] DSS Where to apply for food stamps, Medicaid and utility assistance. [phone] ? RCATS: Transportation to medical appointments. [phone] ? Social Security Administration: May apply for disability if have a Stage IV cancer. [phone] ? [employer], Disability and Transit Services: Assists with nutrition, care and transit needs. [phone]

## 2015-12-29 NOTE — Progress Notes (Signed)
Holly Stephens tolerated chemo tx with Neulasta on-pro well without complaints or incident. Labs reviewed including WBC OF 17.6, with [name] PA prior to administration of chemo and Neulasta. VSS upon discharge. Pt discharged self ambulatory in satisfactory condition accompanied by friend

## 2016-01-12 ENCOUNTER — Encounter (HOSPITAL_COMMUNITY): Payer: Self-pay | Admitting: Hematology & Oncology

## 2016-01-12 ENCOUNTER — Encounter (HOSPITAL_BASED_OUTPATIENT_CLINIC_OR_DEPARTMENT_OTHER): Payer: 59

## 2016-01-12 ENCOUNTER — Encounter (HOSPITAL_BASED_OUTPATIENT_CLINIC_OR_DEPARTMENT_OTHER): Payer: 59 | Admitting: Hematology & Oncology

## 2016-01-12 VITALS — BP 109/49 | HR 102 | Temp 97.9°F | Resp 18 | Wt 253.4 lb

## 2016-01-12 DIAGNOSIS — B20 Human immunodeficiency virus [HIV] disease: Secondary | ICD-10-CM

## 2016-01-12 DIAGNOSIS — R0602 Shortness of breath: Secondary | ICD-10-CM | POA: Diagnosis not present

## 2016-01-12 DIAGNOSIS — Z5189 Encounter for other specified aftercare: Secondary | ICD-10-CM

## 2016-01-12 DIAGNOSIS — Z5111 Encounter for antineoplastic chemotherapy: Secondary | ICD-10-CM | POA: Diagnosis not present

## 2016-01-12 DIAGNOSIS — C8119 Nodular sclerosis classical Hodgkin lymphoma, extranodal and solid organ sites: Secondary | ICD-10-CM

## 2016-01-12 DIAGNOSIS — C8118 Nodular sclerosis classical Hodgkin lymphoma, lymph nodes of multiple sites: Secondary | ICD-10-CM

## 2016-01-12 DIAGNOSIS — R530 Neoplastic (malignant) related fatigue: Secondary | ICD-10-CM | POA: Diagnosis not present

## 2016-01-12 DIAGNOSIS — I5032 Chronic diastolic (congestive) heart failure: Secondary | ICD-10-CM

## 2016-01-12 LAB — COMPREHENSIVE METABOLIC PANEL
ALBUMIN: 3.7 g/dL (ref 3.5–5.0)
ALK PHOS: 61 U/L (ref 38–126)
ALT: 36 U/L (ref 14–54)
ANION GAP: 9 (ref 5–15)
AST: 36 U/L (ref 15–41)
BILIRUBIN TOTAL: 0.3 mg/dL (ref 0.3–1.2)
BUN: 9 mg/dL (ref 6–20)
CALCIUM: 9.6 mg/dL (ref 8.9–10.3)
CO2: 24 mmol/L (ref 22–32)
CREATININE: 0.72 mg/dL (ref 0.44–1.00)
Chloride: 105 mmol/L (ref 101–111)
GFR calc Af Amer: >60 mL/min (ref >60)
GFR calc non Af Amer: >60 mL/min (ref >60)
GLUCOSE: 141 mg/dL — AB (ref 65–99)
Potassium: 3.7 mmol/L (ref 3.5–5.1)
Sodium: 138 mmol/L (ref 135–145)
Total Protein: 6.6 g/dL (ref 6.5–8.1)

## 2016-01-12 LAB — CBC WITH DIFFERENTIAL/PLATELET
BASOS PCT: 0 %
Basophils Absolute: 0 10*3/uL (ref 0.0–0.1)
EOS ABS: 0.2 10*3/uL (ref 0.0–0.7)
Eosinophils Relative: 1 %
HEMATOCRIT: 35.7 % — AB (ref 36.0–46.0)
HEMOGLOBIN: 11.1 g/dL — AB (ref 12.0–15.0)
LYMPHS PCT: 10 %
Lymphs Abs: 1.7 10*3/uL (ref 0.7–4.0)
MCH: 30.2 pg (ref 26.0–34.0)
MCHC: 31.1 g/dL (ref 30.0–36.0)
MCV: 97 fL (ref 78.0–100.0)
Monocytes Absolute: 1.2 10*3/uL — ABNORMAL HIGH (ref 0.1–1.0)
Monocytes Relative: 7 %
NEUTROS PCT: 82 %
Neutro Abs: 13.9 10*3/uL — ABNORMAL HIGH (ref 1.7–7.7)
Platelets: 250 10*3/uL (ref 150–400)
RBC: 3.68 MIL/uL — AB (ref 3.87–5.11)
RDW: 21.1 % — ABNORMAL HIGH (ref 11.5–15.5)
WBC: 17 10*3/uL — AB (ref 4.0–10.5)

## 2016-01-12 MED ORDER — SODIUM CHLORIDE 0.9 % IV SOLN
375.0000 mg/m2 | Freq: Once | INTRAVENOUS | Status: AC
  Administered 2016-01-12: 860 mg via INTRAVENOUS
  Filled 2016-01-12: qty 13

## 2016-01-12 MED ORDER — SODIUM CHLORIDE 0.9 % IV SOLN
Freq: Once | INTRAVENOUS | Status: AC
  Administered 2016-01-12: 11:00:00 via INTRAVENOUS
  Filled 2016-01-12: qty 5

## 2016-01-12 MED ORDER — HEPARIN SOD (PORK) LOCK FLUSH 100 UNIT/ML IV SOLN
500.0000 [IU] | Freq: Once | INTRAVENOUS | Status: AC | PRN
  Administered 2016-01-12: 500 [IU]
  Filled 2016-01-12: qty 5

## 2016-01-12 MED ORDER — PEGFILGRASTIM 6 MG/0.6ML ~~LOC~~ PSKT
6.0000 mg | PREFILLED_SYRINGE | Freq: Once | SUBCUTANEOUS | Status: AC
  Administered 2016-01-12: 6 mg via SUBCUTANEOUS
  Filled 2016-01-12: qty 0.6

## 2016-01-12 MED ORDER — DOXORUBICIN HCL CHEMO IV INJECTION 2 MG/ML
25.0000 mg/m2 | Freq: Once | INTRAVENOUS | Status: AC
  Administered 2016-01-12: 56 mg via INTRAVENOUS
  Filled 2016-01-12: qty 28

## 2016-01-12 MED ORDER — SODIUM CHLORIDE 0.9% FLUSH: 10.0000 mL | INTRAVENOUS | Status: DC | PRN

## 2016-01-12 MED ORDER — SODIUM CHLORIDE 0.9 % IV SOLN
Freq: Once | INTRAVENOUS | Status: AC
  Administered 2016-01-12: 10:00:00 via INTRAVENOUS

## 2016-01-12 MED ORDER — VINBLASTINE SULFATE CHEMO INJECTION 1 MG/ML
6.0000 mg/m2 | Freq: Once | INTRAVENOUS | Status: AC
  Administered 2016-01-12: 13.6 mg via INTRAVENOUS
  Filled 2016-01-12: qty 13.6

## 2016-01-12 MED ORDER — PALONOSETRON HCL INJECTION 0.25 MG/5ML
0.2500 mg | Freq: Once | INTRAVENOUS | Status: AC
  Administered 2016-01-12: 0.25 mg via INTRAVENOUS
  Filled 2016-01-12: qty 5

## 2016-01-12 NOTE — Progress Notes (Signed)
Tolerated tx w/o adverse reaction.  Alert, in no distress.  VSS.  Discharged ambulatory. 

## 2016-01-12 NOTE — Patient Instructions (Signed)
[facility] Discharge Instructions for Patients Receiving Chemotherapy   Beginning January 23rd 2017 lab work for the [facility] will be done in the  Main lab at [facility] on 1st floor. If you have a lab appointment with the [facility] please come in thru the  Main Entrance and check in at the main information desk   Today you received the following chemotherapy agents:  Adriamycin, Velban, and DTIC  If you develop nausea and vomiting, or diarrhea that is not controlled by your medication, call the clinic.  The clinic phone number is [phone]. Office hours are Monday-Friday 8:30am-5:00pm.  BELOW ARE SYMPTOMS THAT SHOULD BE REPORTED IMMEDIATELY:  *FEVER GREATER THAN 101.0 F  *CHILLS WITH OR WITHOUT FEVER  NAUSEA AND VOMITING THAT IS NOT CONTROLLED WITH YOUR NAUSEA MEDICATION  *UNUSUAL SHORTNESS OF BREATH  *UNUSUAL BRUISING OR BLEEDING  TENDERNESS IN MOUTH AND THROAT WITH OR WITHOUT PRESENCE OF ULCERS  *URINARY PROBLEMS  *BOWEL PROBLEMS  UNUSUAL RASH Items with * indicate a potential emergency and should be followed up as soon as possible. If you have an emergency after office hours please contact your primary care physician or go to the nearest emergency department.  Please call the clinic during office hours if you have any questions or concerns.   You may also contact the Patient Navigator at [phone] should you have any questions or need assistance in obtaining follow up care.      Resources For Cancer Patients and their Caregivers ? American Cancer Society: Can assist with transportation, wigs, general needs, runs Look Good Feel Better.        ([phone] ? Cancer Care: Provides financial assistance, online support groups, medication/co-pay assistance.  1-800-813-HOPE 9168336270) ? [facility] Assists [place] cancer patients and their families through emotional , educational and financial support.   [phone] ? [place] DSS Where to apply for food stamps, Medicaid and utility assistance. [phone] ? RCATS: Transportation to medical appointments. 281-426-1571 ? Social Security Administration: May apply for disability if have a Stage IV cancer. [phone] ? [employer], Disability and Transit Services: Assists with nutrition, care and transit needs. [phone]

## 2016-01-12 NOTE — Progress Notes (Signed)
Holly Neighbors, MD [place] [address]    [name], nodular sclerosis ([place])   07/23/2015 Procedure    Lext axilla lymph node biopsy      07/27/2015 Pathology Results    Classical [name], nodular sclerosis type.  Positive CD30, CD15, andPAX-5.  Negative CD45 and CD20.      08/31/2015 PET scan    Hypermetabolic lymphadenopathy. Nodal stations include: L supraclavicular nodes, L axillary nodes, mediastinal lymph nodes, periaortic retroperitoneal and periportal lymph nodes as well as L iliac and inguinal lymph nodes. Two concerning pulm nodules...      09/01/2015 Imaging    MUGA- Normal LEFT ventricular ejection fraction of 73%.  Normal LEFT ventricular wall motion.      09/01/2015 Procedure    PFTs-  Spirometry shows mild ventilatory defect without airflow obstruction and no bronchodilator improvement. Lung volumes are normal. Airway resistance is normal. DLCO normal. Essentially normal lung function      09/08/2015 Procedure    Port placed by Dr. Rosana Hoes      09/13/2015 -  Chemotherapy    ABVD      09/27/2015 Imaging    CT angio of chest- no gross PE seen. Improving axillary, mediastinal and upper abdominal adenopathy. Improved and possibly resolved RUL nodule and with resolved or obscured LLL nodule. Small L pleural effusion and mild L lower lobe atelectasis.      10/11/2015 Treatment Plan Change    Bleomycin discontinued       11/01/2015 PET scan    Clear interval response to therapy with decrease in size and hypermetabolism of larger lymph nodes seen previously and some of the smaller hypermetabolic lymph node seen on the previous study have resolved completely in terms of abnormal enlargement and hypermetabolism. 2. Slight decrease in left lower lobe pulmonary nodule with interval resolution of the right upper lobe pulmonary nodule. 3. Stable appearance of mild heterogeneous FDG accumulation within the marrow space.      11/01/2015  Miscellaneous    Deauville 3- 4 more cycles of AVD recommended according to NCCN guidelines.      02/09/2016 Imaging    [place] left leg- Positive for acute DVT in left calf, popliteal and femoral veins.       CURRENT THERAPY: AVD  INTERVAL HISTORY: Holly Stephens 60 y.o. female returns for followup of Hodgkin's Lymphoma, nodular sclerosing-type, classical Hodgkin's Disease with lymphatic involvement in multiple lymph nodes superior and inferior to the diaphragm with PET imaging demonstrating findings concerning for pulmonary involvement (Stage III versus Stage IV disease ?pulmonary nodules x 2?).  She started ABVD on 09/13/2015 with curative intent with the discontinuation of Bleomycin on 10/11/2015. PET scan after cycle #3 demonstrated Deauville 3 and therefore 4 more cycles of AVD were recommended. Course complicated by hospitalization for diastolic congestive heart failure in 9/17.   Patient feels well. She still experiences some fatigue and dyspnea, but both have improved since previous visit. She still feels breathless when she walks to to the mailbox.   Denies abdominal pain or issues with bowels. Denies edema.  She is schedule for echocardiogram on Friday. Continues to follow with Dr. Haroldine Laws.   Patient does not need refills.  She has already received flu shot this year.   Holly Stephens has been experiencing delays with documents for short term disability, this is frustrating for her. Denies any falls, nausea or vomiting. Appetite is ok.  Review of Systems  Constitutional: Positive for  malaise/fatigue. Negative for chills, fever and weight loss.  HENT: Negative.   Eyes: Negative.   Respiratory: Positive for shortness of breath (improved).        Shortness of breath with exertion  Cardiovascular: Negative for chest pain.  Gastrointestinal: Negative for nausea and vomiting.  Genitourinary: Negative.  Negative for dysuria.  Musculoskeletal: Negative.   Skin: Negative.     Endo/Heme/Allergies: Negative.   Psychiatric/Behavioral: Negative.   14 point review of systems was performed and is negative except as detailed under history of present illness and above   Past Medical History:  Diagnosis Date  . Acute deep vein thrombosis (DVT) of popliteal vein of left lower extremity ([place]) 02/09/2016  . Arthritis    osteoarthritis  . Diabetes mellitus without complication ([place])   . HIV infection ([place])   . [name], nodular sclerosis ([place]) 08/23/2015  . Human immunodeficiency virus (HIV) disease ([place]) 02/23/2006   Qualifier: Diagnosis of  By: Megan Salon MD, John      Past Surgical History:  Procedure Laterality Date  . AXILLARY LYMPH NODE BIOPSY Left 07/23/2015   Procedure: EXCISIONAL BIOPSY LEFT AXILLARY LYMPH NODE;  Surgeon: [name], MD;  Location: [facility];  Service: General;  Laterality: Left;  . PORTACATH PLACEMENT Right 09/08/2015   Procedure: INSERTION OF CENTRAL VENOUS CATHETER WITH PORT FOR CHEMOTHERAPY;  Surgeon: [name], MD;  Location: [facility];  Service: General;  Laterality: Right;  . SHOULDER SURGERY Right    removal of bone spur    Family History  Problem Relation Age of Onset  . Hypertension Mother   . Cancer Mother 51    breast   . Hypertension Father   . Heart attack Father   . Pulmonary embolism Sister   . Parkinson's disease Brother     Social History   Social History  . Marital status: Widowed    Spouse name: N/A  . Number of children: N/A  . Years of education: N/A   Social History Main Topics  . Smoking status: Never Smoker  . Smokeless tobacco: Never Used  . Alcohol use No  . Drug use: No  . Sexual activity: No     Comment: declined condoms   Other Topics Concern  . None   Social History Narrative  . None     PHYSICAL EXAMINATION  ECOG PERFORMANCE STATUS: 1 - Symptomatic but completely ambulatory  There were no vitals filed for this visit. Vitals with BMI 01/12/2016  Height   Weight 253 lbs  6 oz  BMI   Systolic 481  Diastolic 50  Pulse 856  Respirations 18    GENERAL:alert, well nourished, well developed,  cooperative, obese. O2 by nasal cannula.  SKIN: skin color, texture, turgor are normal, no rashes or significant lesions HEAD: Normocephalic, No masses, lesions, tenderness or abnormalities EYES: normal, EOMI, Conjunctiva are pink and non-injected EARS: External ears normal OROPHARYNX:lips, buccal mucosa, and tongue normal and mucous membranes are moist  NECK: supple, trachea midline LYMPH:  Palpable lymph node in left armpit  BREAST:not examined LUNGS: clear to auscultation, no wheezing, no rhonchi, no crackles HEART: regular rate & rhythm ABDOMEN:obese BACK: Back symmetric, no curvature. EXTREMITIES:No significant LE edema, less then 2 second capillary refill, no joint deformities, effusion, or inflammation, no skin discoloration, no clubbing, no cyanosis NEURO: alert & oriented x 3 with fluent speech, no focal motor/sensory deficits, gait normal  LABORATORY DATA: I have reviewed the data as listed. Results for Holly Stephens, Holly Stephens (MRN [id number])  Ref. Range 01/12/2016 09:13  Sodium Latest Ref Range: 135 - 145 mmol/L 138  Potassium Latest Ref Range: 3.5 - 5.1 mmol/L 3.7  Chloride Latest Ref Range: 101 - 111 mmol/L 105  CO2 Latest Ref Range: 22 - 32 mmol/L 24  Glucose Latest Ref Range: 65 - 99 mg/dL 141 (H)  BUN Latest Ref Range: 6 - 20 mg/dL 9  Creatinine Latest Ref Range: 0.44 - 1.00 mg/dL 0.72  Calcium Latest Ref Range: 8.9 - 10.3 mg/dL 9.6  Anion gap Latest Ref Range: 5 - 15  9  Alkaline Phosphatase Latest Ref Range: 38 - 126 U/L 61  Albumin Latest Ref Range: 3.5 - 5.0 g/dL 3.7  AST Latest Ref Range: 15 - 41 U/L 36  ALT Latest Ref Range: 14 - 54 U/L 36  Total Protein Latest Ref Range: 6.5 - 8.1 g/dL 6.6  Total Bilirubin Latest Ref Range: 0.3 - 1.2 mg/dL 0.3  EGFR (African American) Latest Ref Range: >60 mL/min >60  EGFR (Non-African Amer.) Latest Ref  Range: >60 mL/min >60  WBC Latest Ref Range: 4.0 - 10.5 K/uL 17.0 (H)  RBC Latest Ref Range: 3.87 - 5.11 MIL/uL 3.68 (L)  Hemoglobin Latest Ref Range: 12.0 - 15.0 g/dL 11.1 (L)  HCT Latest Ref Range: 36.0 - 46.0 % 35.7 (L)  MCV Latest Ref Range: 78.0 - 100.0 fL 97.0  MCH Latest Ref Range: 26.0 - 34.0 pg 30.2  MCHC Latest Ref Range: 30.0 - 36.0 g/dL 31.1  RDW Latest Ref Range: 11.5 - 15.5 % 21.1 (H)  Platelets Latest Ref Range: 150 - 400 K/uL 250  Neutrophils Latest Units: % 82  Lymphocytes Latest Units: % 10  Monocytes Relative Latest Units: % 7  Eosinophil Latest Units: % 1  Basophil Latest Units: % 0  NEUT# Latest Ref Range: 1.7 - 7.7 K/uL 13.9 (H)  Lymphocyte # Latest Ref Range: 0.7 - 4.0 K/uL 1.7  Monocyte # Latest Ref Range: 0.1 - 1.0 K/uL 1.2 (H)  Eosinophils Absolute Latest Ref Range: 0.0 - 0.7 K/uL 0.2  Basophils Absolute Latest Ref Range: 0.0 - 0.1 K/uL 0.0     RADIOGRAPHIC STUDIES: I have personally reviewed the radiological images as listed and agreed with the findings in the report.  No results found.  Addendum   ADDENDUM REPORT: 11/01/2015 16:24  ADDENDUM: Deauville Scores:  1. Index left axillary lymph node = 3 2. Index right axillary lymph nodes = 2 3. Left inferior hilar lymph node = 3 4. Left internal mammary lymph node = 1 5. Index periportal lymph node = 2 6. Index left inguinal lymph node = 2   Electronically Signed   By: Misty Stanley M.D.   On: 11/01/2015 16:24   Addended by Misty Stanley, MD on 11/01/2015 4:26 PM    Study Result   CLINICAL DATA:  Subsequent treatment strategy for [name].  EXAM: NUCLEAR MEDICINE PET SKULL BASE TO THIGH  TECHNIQUE: 12.9 mCi F-18 FDG was injected intravenously. Full-ring PET imaging was performed from the skull base to thigh after the radiotracer. CT data was obtained and used for attenuation correction and anatomic localization.  FASTING BLOOD GLUCOSE:  Value: 128 mg/dl  COMPARISON:   08/31/2015  FINDINGS: NECK  Increased activity in the vocal cords is symmetric and likely related to patient speaking after contrast injection.  CHEST  Marked interval improvement and hypermetabolic lymphadenopathy seen previously.  Bulky left axillary lymphadenopathy which was 2.8 cm in short axis on the previous study is now 2.3  cm. SUV max = 3.7 today compared to 12.2 previously.  The hypermetabolic smaller right axillary lymph nodes have resolved in the interval. Previously, there is was a a hypermetabolic 1.2 cm short axis right axillary lymph node. On the current study, this same lymph node measures 4 mm in shows no hypermetabolism on PET images.  Hypermetabolic mediastinal and hilar lymphadenopathy has also decreased substantially in the interval. 12 mm short axis subcarinal lymph node with SUV max = 11.8 previously has decreased to 5 mm short axis today with no evidence for hypermetabolic uptake above background mediastinal levels.  The inferior left hilar metabolic lymphadenopathy upper metabolic lymphadenopathy seen previously has decreased in the interval. When I remeasure on the prior study SUV max = 6.2 compared to 4.3 today.  Previously described left internal mammary hypermetabolic lymph node measuring 12 mm on the prior study has decreased to 4 mm today and shows no hypermetabolic FDG uptake.  Previously described left lower lobe nodule is 6 mm today compared to 7 mm previously (see image 40 series a on today's exam). The right upper lobe nodule seen previously is not evident today. There is bilateral subsegmental atelectasis patchy ground-glass attenuation, likely related to atelectasis on this free breathing study.  ABDOMEN/PELVIS  The bulky retroperitoneal lymphadenopathy has improved dramatically in the interval.  19 mm periportal lymph node has decreased to 9 mm short axis in the interval with SUV max = 3.0 today compared to 12.2  previously.  Previously described retroperitoneal and pelvic sidewall hypermetabolic lymphadenopathy is also decreased.  Index left inguinal lymph node which was 2 cm short axis previously with SUV max = 18.5, now measures 7 mm short axis with FDG uptake at background soft tissue levels.  Midline ventral hernia again noted, containing only fat.  SKELETON  Scattered patchy FDG uptake within the marrow space is stable in the interval.  IMPRESSION: 1. Clear interval response to therapy with decrease in size and hypermetabolism of larger lymph nodes seen previously and some of the smaller hypermetabolic lymph node seen on the previous study have resolved completely in terms of abnormal enlargement and hypermetabolism. 2. Slight decrease in left lower lobe pulmonary nodule with interval resolution of the right upper lobe pulmonary nodule. 3. Stable appearance of mild heterogeneous FDG accumulation within the marrow space. 4. Midline ventral hernia is stable, contains only fat.  Electronically Signed: By: Misty Stanley M.D. On: 11/01/2015 11:18       PATHOLOGY: Diagnosis Lymph node for lymphoma, left axillary - CLASSICAL [name], NODULAR SCLEROSIS TYPE.    ASSESSMENT AND PLAN:  [name], nodular sclerosis (HCC) Stage IIIA  Obesity HIV Altered tasted secondary to chemotherapy Chemotherapy related fatigue Diarrhea Shortness of breath Weight loss Hypokalemia CHF O2 dependence Diastolic heart failure  Hodgkin's Lymphoma, nodular sclerosing-type, classical Hodgkin's Disease with lymphatic involvement in multiple lymph nodes superior and inferior to the diaphragm with PET imaging demonstrating findings concerning for pulmonary involvement (Stage III versus Stage IV disease ?pulmonary nodules x 2?).  Started ABVD on 09/13/2015 with curative intent with the discontinuation of Bleomycin on 10/11/2015.  PET scan after cycle #3 demonstrated Deauville 3  and therefore 4 more cycles of AVD is recommended.  Course complicated by hospitalization for diastolic congestive heart failure now being followed by Dr. Haroldine Laws in addition to HIV+ that is well controlled and followed by Dr. Megan Salon.  Treat today. Labs reviewed. Results are noted above.   I continue to feel palpable axillary adenopathy. I am concerned that based  upon her last PET and ongoing exam she may need IMRT at the conclusion of therapy. .   I will ask Sharyn Lull about patient's short disability forms.   I have referred her to the [facility] program. I advised her that she will benefit from physical activity and have encouraged her to walk daily.   She is to continue to follow with Dr. Haroldine Laws.  RTC prior to cycle #6 with labs and physical.  All questions were answered. The patient knows to call the clinic with any problems, questions or concerns. We can certainly see the patient much sooner if necessary.  This document serves as a record of services personally performed by Ancil Linsey, MD. It was created on her behalf by Elmyra Ricks, a trained medical scribe. The creation of this record is based on the scribe's personal observations and the provider's statements to them. This document has been checked and approved by the attending provider.  I have reviewed the above documentation for accuracy and completeness, and I agree with the above.  This note is electronically signed [id number] Cyril Mourning, MD  03/18/2016 1:57 PM

## 2016-01-12 NOTE — Patient Instructions (Addendum)
[place] at [facility] Discharge Instructions  RECOMMENDATIONS MADE BY THE CONSULTANT AND ANY TEST RESULTS WILL BE SENT TO YOUR REFERRING PHYSICIAN.  You saw Dr.Penland today. Follow up in 2 weeks with MD appt, chemo and labs. See Amy at checkout for appointments.  Thank you for choosing [place] at [facility] to provide your oncology and hematology care.  To afford each patient quality time with our provider, please arrive at least 15 minutes before your scheduled appointment time.   Beginning January 23rd 2017 lab work for the [facility] will be done in the  Main lab at [facility] on 1st floor. If you have a lab appointment with the [facility] please come in thru the  Main Entrance and check in at the main information desk  You need to re-schedule your appointment should you arrive 10 or more minutes late.  We strive to give you quality time with our providers, and arriving late affects you and other patients whose appointments are after yours.  Also, if you no show three or more times for appointments you may be dismissed from the clinic at the providers discretion.     Again, thank you for choosing [facility].  Our hope is that these requests will decrease the amount of time that you wait before being seen by our physicians.       _____________________________________________________________  Should you have questions after your visit to [facility], please contact our office at [phone] between the hours of 8:30 a.m. and 4:30 p.m.  Voicemails left after 4:30 p.m. will not be returned until the following business day.  For prescription refill requests, have your pharmacy contact our office.         Resources For Cancer Patients and their Caregivers ? American Cancer Society: Can assist with transportation, wigs, general needs, runs Look Good Feel Better.        312-053-9732 ? Cancer  Care: Provides financial assistance, online support groups, medication/co-pay assistance.  1-800-813-HOPE 308 724 6559) ? [place] Assists [place] cancer patients and their families through emotional , educational and financial support.  (862)733-3438 ? [place] DSS Where to apply for food stamps, Medicaid and utility assistance. (318) 151-0306 ? [facility]: Transportation to medical appointments. (337)614-9572 ? Social Security Administration: May apply for disability if have a Stage IV cancer. (540)744-1856 706-415-7508 ? [employer], Disability and Transit Services: Assists with nutrition, care and transit needs. [place] Support Programs: @10RELATIVEDAYS @ > Cancer Support Group  2nd Tuesday of the month 1pm-2pm, Journey Room  > Creative Journey  3rd Tuesday of the month 1130am-1pm, Journey Room  > Look Good Feel Better  1st Wednesday of the month 10am-12 noon, Journey Room (Call [place] to register [phone])

## 2016-01-21 ENCOUNTER — Ambulatory Visit (HOSPITAL_BASED_OUTPATIENT_CLINIC_OR_DEPARTMENT_OTHER)
Admission: RE | Admit: 2016-01-21 | Discharge: 2016-01-21 | Disposition: A | Discharge status: Home / Self Care | Payer: 59 | Source: Ambulatory Visit | Attending: Internal Medicine | Admitting: Internal Medicine

## 2016-01-21 ENCOUNTER — Ambulatory Visit (HOSPITAL_COMMUNITY)
Admission: RE | Admit: 2016-01-21 | Discharge: 2016-01-21 | Disposition: A | Discharge status: Home / Self Care | Payer: 59 | Source: Ambulatory Visit | Attending: Internal Medicine | Admitting: Internal Medicine

## 2016-01-21 ENCOUNTER — Other Ambulatory Visit (HOSPITAL_COMMUNITY): Payer: Self-pay | Admitting: *Deleted

## 2016-01-21 ENCOUNTER — Encounter (HOSPITAL_COMMUNITY): Payer: Self-pay | Admitting: Internal Medicine

## 2016-01-21 ENCOUNTER — Telehealth (HOSPITAL_COMMUNITY): Payer: Self-pay | Admitting: Internal Medicine

## 2016-01-21 VITALS — BP 130/62 | HR 98 | Wt 253.8 lb

## 2016-01-21 DIAGNOSIS — I5032 Chronic diastolic (congestive) heart failure: Secondary | ICD-10-CM | POA: Diagnosis not present

## 2016-01-21 DIAGNOSIS — I509 Heart failure, unspecified: Secondary | ICD-10-CM | POA: Diagnosis not present

## 2016-01-21 DIAGNOSIS — E119 Type 2 diabetes mellitus without complications: Secondary | ICD-10-CM | POA: Diagnosis not present

## 2016-01-21 DIAGNOSIS — I110 Hypertensive heart disease with heart failure: Secondary | ICD-10-CM | POA: Diagnosis present

## 2016-01-21 DIAGNOSIS — Z6841 Body Mass Index (BMI) 40.0 and over, adult: Secondary | ICD-10-CM | POA: Insufficient documentation

## 2016-01-21 DIAGNOSIS — Z880 Allergy status to penicillin: Secondary | ICD-10-CM | POA: Diagnosis not present

## 2016-01-21 DIAGNOSIS — I5022 Chronic systolic (congestive) heart failure: Secondary | ICD-10-CM

## 2016-01-21 DIAGNOSIS — I371 Nonrheumatic pulmonary valve insufficiency: Secondary | ICD-10-CM | POA: Insufficient documentation

## 2016-01-21 DIAGNOSIS — C8110 Nodular sclerosis classical Hodgkin lymphoma, unspecified site: Secondary | ICD-10-CM | POA: Diagnosis not present

## 2016-01-21 DIAGNOSIS — B20 Human immunodeficiency virus [HIV] disease: Secondary | ICD-10-CM | POA: Insufficient documentation

## 2016-01-21 DIAGNOSIS — I071 Rheumatic tricuspid insufficiency: Secondary | ICD-10-CM | POA: Insufficient documentation

## 2016-01-21 DIAGNOSIS — M1990 Unspecified osteoarthritis, unspecified site: Secondary | ICD-10-CM | POA: Diagnosis not present

## 2016-01-21 DIAGNOSIS — I429 Cardiomyopathy, unspecified: Secondary | ICD-10-CM

## 2016-01-21 MED ORDER — SPIRONOLACTONE 25 MG PO TABS
25.0000 mg | ORAL_TABLET | Freq: Every day | ORAL | 3 refills | Status: DC
Start: 2016-01-21 — End: 2016-02-08

## 2016-01-21 NOTE — Telephone Encounter (Signed)
Called patient to follow up on medication clarification from today's OV. Patient had spironolactone 50 mg once daily active on medication list, however was unsure if she was taking this. Patient confirms she was taken off of this after discharge from hospital, unable to remember why. Advised per Dr. Haroldine Laws will send in spironolactone 25 mg once daily to her preferred pharmacy electronically. Patient aware and agreeable to plan as stated above.  Renee Pain, RN

## 2016-01-21 NOTE — Addendum Note (Signed)
Encounter addended by: Effie Ber[place]hire, RN on: 01/21/2016 11:00 AM<BR>    Actions taken: Order list changed, Diagnosis association updated, Sign clinical note

## 2016-01-21 NOTE — Progress Notes (Signed)
  Echocardiogram 2D Echocardiogram has been performed.  Tresa Res 01/21/2016, 9:18 AM

## 2016-01-21 NOTE — Patient Instructions (Signed)
Call to clarify if taking Spironolactone or not. [phone] Push option for triage, may leave a voice message with this info.  Follow up 6 weeks with Dr. Haroldine Laws and echocardiogram.  Do the following things EVERYDAY: 1) Weigh yourself in the morning before breakfast. Write it down and keep it in a log. 2) Take your medicines as prescribed 3) Eat low salt foods-Limit salt (sodium) to 2000 mg per day.  4) Stay as active as you can everyday 5) Limit all fluids for the day to less than 2 liters

## 2016-01-21 NOTE — Telephone Encounter (Signed)
Pt was asked to restart Holly Stephens at today's visit.  However, was discontinued at most recent hospitalization as chemo interaction was found, please advise further.

## 2016-01-21 NOTE — Addendum Note (Signed)
Encounter addended by: Effie [place]hire, RN on: 01/21/2016  4:09 PM<BR>    Actions taken: Order Reconciliation Section accessed, Order list changed

## 2016-01-21 NOTE — Progress Notes (Signed)
CARDIO_ONCOLOGY CLINIC NOTE  Referring Physician: Dr. Whitney Muse   HPI:  Holly Stephens 60 y.o. female with morbid obesity, HIV, HTN, DM, Hodgkin's Disease with lymphatic and possibly pulmonary involvement (Stage III versus Stage IV disease ?pulmonary nodules x 2?).    She started ABVD on 09/13/2015 with curative intent with the discontinuation of Bleomycin on 10/11/2015.  PET scan after cycle #3 demonstrated Deauville 3 and therefore 4 more cycles of AVD were recommended.  Course complicated by hospitalization for diastolic congestive heart failure in 9/17.   Diuresed and improved. Echo with normal EF and grade 1 DD.  Here for f/u:  Feeling much better. Now taking lasix 20 mg daily.  Occasionally doubles up 1x/week when ankles swelling. Edema controlled. Weight stable. No orthopnea or PND. No CP. Mild exertional dyspnea. Tolerating chemo OK. 3 cycles left.   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  Positive CD30, CD15, andPAX-5.  Negative CD45 and CD20.     08/31/2015 PET scan    Hypermetabolic lymphadenopathy. Nodal stations include: L supraclavicular nodes, L axillary nodes, mediastinal lymph nodes, periaortic retroperitoneal and periportal lymph nodes as well as L iliac and inguinal lymph nodes. Two concerning pulm nodules...     09/01/2015 Imaging    MUGA- Normal LEFT ventricular ejection fraction of 73%.  Normal LEFT ventricular wall motion.     09/01/2015 Procedure    PFTs-  Spirometry shows mild ventilatory defect without airflow obstruction and no bronchodilator improvement. Lung volumes are normal. Airway resistance is normal. DLCO normal. Essentially normal lung  function     09/08/2015 Procedure    Port placed by Dr. Rosana Hoes     09/13/2015 -  Chemotherapy    ABVD     09/27/2015 Imaging    CT angio of chest- no gross PE seen. Improving axillary, mediastinal and upper abdominal adenopathy. Improved and possibly resolved RUL nodule and with resolved or obscured LLL nodule. Small L pleural effusion and mild L lower lobe atelectasis.     10/11/2015 Treatment Plan Change    Bleomycin discontinued      11/01/2015 PET scan    Clear interval response to therapy with decrease in size and hypermetabolism of larger lymph nodes seen previously and some of the smaller hypermetabolic lymph node seen on the previous study have resolved completely in terms of abnormal enlargement and hypermetabolism. 2. Slight decrease in left lower lobe pulmonary nodule with interval resolution of the right upper lobe pulmonary nodule. 3. Stable appearance of mild heterogeneous FDG accumulation within the marrow space.     11/01/2015 Miscellaneous    Deauville 3- 4 more cycles of AVD recommended according to NCCN guidelines.           Past Medical History:  Diagnosis Date  . Arthritis    osteoarthritis  . Diabetes mellitus without complication ([place])   . HIV infection ([place])   . 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 Human immunodeficiency virus (HIV) disease ([place]) 02/23/2006   Qualifier: Diagnosis of  By: Megan Salon MD, John      Current Outpatient Prescriptions  Medication Sig Dispense Refill  . acetaminophen (TYLENOL) 650  MG CR tablet Take 650 mg by mouth every 8 (eight) hours as needed for pain.    . cholecalciferol (VITAMIN D) 1000 units tablet Take 2,000 Units by mouth daily.    . dacarbazine (DTIC) 200 MG chemo injection Inject into the vein every 14 (fourteen) days.     Marland Kitchen dexamethasone (DECADRON) 4 MG tablet Take 8 mg by mouth 2 (two) times daily with a meal. Pt is to take for three days after chemo.    . Diclofenac Sodium  (PENNSAID) 2 % SOLN Place 2 application onto the skin 2 (two) times daily as needed (for pain).     Marland Kitchen dolutegravir (TIVICAY) 50 MG tablet Take 1 tablet (50 mg total) by mouth daily. 90 tablet 0  . DOXOrubicin (ADRIAMYCIN) 50 MG chemo injection Inject 60 mg into the vein every 14 (fourteen) days.     Marland Kitchen emtricitabine-rilpivir-tenofovir AF (ODEFSEY) 200-25-25 MG TABS tablet Take 1 tablet by mouth daily. 90 tablet 0  . fenofibrate 160 MG tablet Take 160 mg by mouth daily.     . furosemide (LASIX) 40 MG tablet Take 0.5 tablets (20 mg total) by mouth daily. 30 tablet 0  . Garlic 123XX123 MG CAPS Take 1,000 mg by mouth daily.     Marland Kitchen JANUMET 50-500 MG per tablet Take 1 tablet by mouth 2 (two) times daily with a meal.     . lidocaine-prilocaine (EMLA) cream Apply 1 application topically as needed (prior to accessing port).    . loratadine (CLARITIN) 10 MG tablet Take 10 mg by mouth See admin instructions. Pt only takes the day before chemo and five days after.    Marland Kitchen LORazepam (ATIVAN) 0.5 MG tablet Take 0.5 mg by mouth every 8 (eight) hours as needed for anxiety.    . metoprolol tartrate (LOPRESSOR) 25 MG tablet Take 1 tablet (25 mg total) by mouth daily. 30 tablet 0  . Multiple Vitamin (MULTIVITAMIN WITH MINERALS) TABS tablet Take 1 tablet by mouth daily.    . ondansetron (ZOFRAN) 8 MG tablet     . oxyCODONE-acetaminophen (ROXICET) 5-325 MG tablet Take 1 tablet by mouth every 4 (four) hours as needed for severe pain. 30 tablet 0  . potassium chloride SA (K-DUR,KLOR-CON) 20 MEQ tablet Take 1 tablet (20 mEq total) by mouth 3 (three) times daily. 90 tablet 1  . prochlorperazine (COMPAZINE) 10 MG tablet Take 1 tablet (10 mg total) by mouth every 6 (six) hours as needed for nausea or vomiting. 120 tablet 0  . spironolactone (ALDACTONE) 50 MG tablet     . vinBLAStine in sodium chloride 0.9 % 50 mL Inject into the vein every 14 (fourteen) days.      No current facility-administered medications for this encounter.      Allergies  Allergen Reactions  . Augmentin [Amoxicillin-Pot Clavulanate] Hives and Other (See Comments)    Has patient had a PCN reaction causing immediate rash, facial/tongue/throat swelling, SOB or lightheadedness with hypotension: No Has patient had a PCN reaction causing severe rash involving mucus membranes or skin necrosis: No Has patient had a PCN reaction that required hospitalization No Has patient had a PCN reaction occurring within the last 10 years: No If all of the above answers are "NO", then may proceed with Cephalosporin use.      Social History   Social History  . Marital status: Widowed    Spouse name: N/A  . Number of children: N/A  . Years of education: N/A   Occupational History  .  Not on file.   Social History Main Topics  . Smoking status: Never Smoker  . Smokeless tobacco: Never Used  . Alcohol use No  . Drug use: No  . Sexual activity: No     Comment: declined condoms   Other Topics Concern  . Not on file   Social History Narrative  . No narrative on file      Family History  Problem Relation Age of Onset  . Hypertension Mother   . Cancer Mother 80    breast   . Hypertension Father   . Heart attack Father   . Pulmonary embolism Sister   . Parkinson's disease Brother     Vitals:   01/21/16 1015  BP: 130/62  Pulse: 98  SpO2: 96%  Weight: 253 lb 12.8 oz (115.1 kg)   Wt Readings from Last 3 Encounters:  01/21/16 253 lb 12.8 oz (115.1 kg)  01/12/16 253 lb 6.4 oz (114.9 kg)  12/29/15 256 lb 9.6 oz (116.4 kg)     PHYSICAL EXAM: General:  Obese woman NAD HEENT: normal Neck: supple. Hard to assess JVD. RIJ port-a-cath  Looks ok, Carotids 2+ bilat; no bruits. No lymphadenopathy or thryomegaly appreciated. Cor: PMI nondisplaced. Regular rate & rhythm. No rubs, gallops or murmurs. Lungs: clear Abdomen: markedly obese soft, nontender, nondistended. No bruits or masses. Good bowel sounds. Extremities: no cyanosis, clubbing, rash,  edema Neuro: alert & oriented x 3, cranial nerves grossly intact. moves all 4 extremities w/o difficulty. Affect pleasant.  ECG:  NSR with RBBB   ASSESSMENT & PLAN: 1. Chronic diastolic HF --Echo reviewed personally. EF normal. No evidence of adriamycin toxicity. Suspect volume overload due to fluid loading post chemo.  --Volume status much improved with low-dose lasix. Reinforced need for daily weights and reviewed use of sliding scale diuretics. --Recheck echo at end of chemo in January and then 6 months after completion of Adriamycin --Has spiro 50mg  daily on her list but not sure she is taking. If not will start spiro 25 daily and stop potassium.  2. Hodgkin's lymphoma with LN involvement --Receiving ABD through the beginning of 2018 3. Morbid Obesity 4. DM2 5. Mild RV dilation on echo --Possibly OSA. Watch exertional O2 sats will refer for sleep study when chemo complete.   Bensimhon, Daniel,MD 10:41 AM

## 2016-01-24 ENCOUNTER — Telehealth (HOSPITAL_COMMUNITY): Payer: Self-pay | Admitting: *Deleted

## 2016-01-24 NOTE — Telephone Encounter (Signed)
Patient was called regarding her question, Dr. Kileen Lange Muse ok for patient to take spironolactone, patient aware.

## 2016-01-26 ENCOUNTER — Encounter (HOSPITAL_COMMUNITY): Payer: 59 | Attending: Hematology & Oncology | Admitting: Oncology

## 2016-01-26 ENCOUNTER — Ambulatory Visit (HOSPITAL_COMMUNITY)
Admission: RE | Admit: 2016-01-26 | Discharge: 2016-01-26 | Disposition: A | Discharge status: Home / Self Care | Payer: 59 | Source: Ambulatory Visit | Attending: Hematology & Oncology | Admitting: Hematology & Oncology

## 2016-01-26 ENCOUNTER — Encounter (HOSPITAL_BASED_OUTPATIENT_CLINIC_OR_DEPARTMENT_OTHER): Payer: 59

## 2016-01-26 ENCOUNTER — Encounter (HOSPITAL_COMMUNITY): Payer: Self-pay | Admitting: Oncology

## 2016-01-26 VITALS — BP 115/60 | HR 90 | Temp 98.4°F | Resp 16

## 2016-01-26 DIAGNOSIS — Z452 Encounter for adjustment and management of vascular access device: Secondary | ICD-10-CM | POA: Diagnosis present

## 2016-01-26 DIAGNOSIS — C8118 Nodular sclerosis classical Hodgkin lymphoma, lymph nodes of multiple sites: Secondary | ICD-10-CM | POA: Insufficient documentation

## 2016-01-26 DIAGNOSIS — C8119 Nodular sclerosis classical Hodgkin lymphoma, extranodal and solid organ sites: Secondary | ICD-10-CM

## 2016-01-26 DIAGNOSIS — Z5111 Encounter for antineoplastic chemotherapy: Secondary | ICD-10-CM | POA: Diagnosis not present

## 2016-01-26 LAB — CBC WITH DIFFERENTIAL/PLATELET
BASOS ABS: 0.1 10*3/uL (ref 0.0–0.1)
BASOS PCT: 1 %
Eosinophils Absolute: 0.2 10*3/uL (ref 0.0–0.7)
Eosinophils Relative: 1 %
HEMATOCRIT: 38 % (ref 36.0–46.0)
HEMOGLOBIN: 11.9 g/dL — AB (ref 12.0–15.0)
LYMPHS PCT: 10 %
Lymphs Abs: 1.5 10*3/uL (ref 0.7–4.0)
MCH: 30.3 pg (ref 26.0–34.0)
MCHC: 31.3 g/dL (ref 30.0–36.0)
MCV: 96.7 fL (ref 78.0–100.0)
MONO ABS: 1 10*3/uL (ref 0.1–1.0)
Monocytes Relative: 7 %
NEUTROS ABS: 12.4 10*3/uL — AB (ref 1.7–7.7)
NEUTROS PCT: 81 %
Platelets: 274 10*3/uL (ref 150–400)
RBC: 3.93 MIL/uL (ref 3.87–5.11)
RDW: 18.5 % — AB (ref 11.5–15.5)
WBC: 15.2 10*3/uL — ABNORMAL HIGH (ref 4.0–10.5)

## 2016-01-26 LAB — COMPREHENSIVE METABOLIC PANEL
ALBUMIN: 3.7 g/dL (ref 3.5–5.0)
ALK PHOS: 61 U/L (ref 38–126)
ALT: 46 U/L (ref 14–54)
ANION GAP: 10 (ref 5–15)
AST: 52 U/L — AB (ref 15–41)
BUN: 9 mg/dL (ref 6–20)
CHLORIDE: 105 mmol/L (ref 101–111)
CO2: 24 mmol/L (ref 22–32)
Calcium: 9.7 mg/dL (ref 8.9–10.3)
Creatinine, Ser: 0.48 mg/dL (ref 0.44–1.00)
GFR calc non Af Amer: >60 mL/min (ref >60)
Glucose, Bld: 131 mg/dL — ABNORMAL HIGH (ref 65–99)
POTASSIUM: 3.5 mmol/L (ref 3.5–5.1)
Sodium: 139 mmol/L (ref 135–145)
Total Bilirubin: 0.5 mg/dL (ref 0.3–1.2)
Total Protein: 6.6 g/dL (ref 6.5–8.1)

## 2016-01-26 MED ORDER — VINBLASTINE SULFATE CHEMO INJECTION 1 MG/ML
6.0000 mg/m2 | Freq: Once | INTRAVENOUS | Status: AC
  Administered 2016-01-26: 13.6 mg via INTRAVENOUS
  Filled 2016-01-26: qty 13.6

## 2016-01-26 MED ORDER — SODIUM CHLORIDE 0.9 % IV SOLN
Freq: Once | INTRAVENOUS | Status: AC
  Administered 2016-01-26: 12:00:00 via INTRAVENOUS

## 2016-01-26 MED ORDER — FOSAPREPITANT DIMEGLUMINE INJECTION 150 MG
Freq: Once | INTRAVENOUS | Status: AC
  Administered 2016-01-26: 12:00:00 via INTRAVENOUS
  Filled 2016-01-26: qty 5

## 2016-01-26 MED ORDER — DOXORUBICIN HCL CHEMO IV INJECTION 2 MG/ML
25.0000 mg/m2 | Freq: Once | INTRAVENOUS | Status: AC
  Administered 2016-01-26: 56 mg via INTRAVENOUS
  Filled 2016-01-26: qty 28

## 2016-01-26 MED ORDER — SODIUM CHLORIDE 0.9% FLUSH
10.0000 mL | INTRAVENOUS | Status: DC | PRN
  Administered 2016-01-26: 10 mL
  Filled 2016-01-26: qty 10

## 2016-01-26 MED ORDER — PALONOSETRON HCL INJECTION 0.25 MG/5ML
0.2500 mg | Freq: Once | INTRAVENOUS | Status: AC
  Administered 2016-01-26: 0.25 mg via INTRAVENOUS
  Filled 2016-01-26: qty 5

## 2016-01-26 MED ORDER — ALTEPLASE 2 MG IJ SOLR
2.0000 mg | Freq: Once | INTRAMUSCULAR | Status: AC | PRN
  Administered 2016-01-26: 2 mg
  Filled 2016-01-26: qty 2

## 2016-01-26 MED ORDER — STERILE WATER FOR INJECTION IJ SOLN
INTRAMUSCULAR | Status: AC
  Filled 2016-01-26: qty 10

## 2016-01-26 MED ORDER — HEPARIN SOD (PORK) LOCK FLUSH 100 UNIT/ML IV SOLN
500.0000 [IU] | Freq: Once | INTRAVENOUS | Status: AC | PRN
  Administered 2016-01-26: 500 [IU]

## 2016-01-26 MED ORDER — SODIUM CHLORIDE 0.9 % IV SOLN
375.0000 mg/m2 | Freq: Once | INTRAVENOUS | Status: AC
  Administered 2016-01-26: 860 mg via INTRAVENOUS
  Filled 2016-01-26: qty 43

## 2016-01-26 MED ORDER — SODIUM CHLORIDE 0.9% FLUSH
INTRAVENOUS | Status: AC
  Filled 2016-01-26: qty 10

## 2016-01-26 MED ORDER — IOPAMIDOL (ISOVUE-300) INJECTION 61%
INTRAVENOUS | Status: AC
  Filled 2016-01-26: qty 50

## 2016-01-26 NOTE — Assessment & Plan Note (Addendum)
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  Started ABVD on 09/13/2015 with curative intent with the discontinuation of Bleomycin on 10/11/2015.  PET scan after cycle #3 demonstrated Deauville 3 and therefore 4 more cycles of AVD is recommended.  Course complicated by hospitalization for diastolic congestive heart failure now being followed by Dr. Haroldine Laws in addition to HIV+ that is well controlled and followed by Dr. Megan Salon.  Oncology history updated.  Pre-treatment labs today: CBC diff, CMET.  I personally reviewed and went over laboratory results with the patient.  The results are noted within this dictation.  Labs are pending at this time and based upon results, will plan on moving forward with the completion of cycle #5 today.  Based upon [facility] we will make a decision regarding the role of Neulasta support today.  She saw Dr. Haroldine Laws on 01/21/2016 for consultation: 1. Chronic diastolic HF --Echo reviewed personally. EF normal. No evidence of adriamycin toxicity. Suspect volume overload due to fluid loading post chemo.  --Volume status much improved with low-dose lasix. Reinforced need for daily weights and reviewed use of sliding scale diuretics. --Recheck echo at end of chemo in January and then 6 months after completion of Adriamycin --Has spiro 50mg  daily on her list but not sure she is taking. If not will start spiro 25 daily and stop potassium.  2. Hodgkin's lymphoma with LN involvement --Receiving ABD through the beginning of 2018 3. Morbid Obesity 4. DM2 5. Mild RV dilation on echo --Possibly OSA. Watch exertional O2 sats will refer for sleep study when chemo complete.   Return in 2 weeks for follow-up and treatment.

## 2016-01-26 NOTE — Patient Instructions (Addendum)
[place] at [facility] Discharge Instructions  RECOMMENDATIONS MADE BY THE CONSULTANT AND ANY TEST RESULTS WILL BE SENT TO YOUR REFERRING PHYSICIAN.  You saw [name], PA-C, today. Treatment today.  You are nearing the end of your chemotherapy treatment.  Congrats! Return in 2 weeks for labs and to embark on cycle #6 (your last).  Thank you for choosing [place] at [facility] to provide your oncology and hematology care.  To afford each patient quality time with our provider, please arrive at least 15 minutes before your scheduled appointment time.   Beginning January 23rd 2017 lab work for the [facility] will be done in the  Main lab at [facility] on 1st floor. If you have a lab appointment with the [facility] please come in thru the  Main Entrance and check in at the main information desk  You need to re-schedule your appointment should you arrive 10 or more minutes late.  We strive to give you quality time with our providers, and arriving late affects you and other patients whose appointments are after yours.  Also, if you no show three or more times for appointments you may be dismissed from the clinic at the providers discretion.     Again, thank you for choosing [facility].  Our hope is that these requests will decrease the amount of time that you wait before being seen by our physicians.       _____________________________________________________________  Should you have questions after your visit to [facility], please contact our office at [phone] between the hours of 8:30 a.m. and 4:30 p.m.  Voicemails left after 4:30 p.m. will not be returned until the following business day.  For prescription refill requests, have your pharmacy contact our office.         Resources For Cancer Patients and their Caregivers ? American Cancer Society: Can assist with transportation, wigs, general  needs, runs Look Good Feel Better.        (203)531-8483 ? Cancer Care: Provides financial assistance, online support groups, medication/co-pay assistance.  1-800-813-HOPE (219)560-1976) ? [place] Assists [place] cancer patients and their families through emotional , educational and financial support.  706-393-1295 ? [place] DSS Where to apply for food stamps, Medicaid and utility assistance. (931)819-7196 ? [facility]: Transportation to medical appointments. 3390256552 ? Social Security Administration: May apply for disability if have a Stage IV cancer. (640)872-2212 952-338-3590 ? [employer], Disability and Transit Services: Assists with nutrition, care and transit needs. [place] Support Programs: @10RELATIVEDAYS @ > Cancer Support Group  2nd Tuesday of the month 1pm-2pm, Journey Room  > Creative Journey  3rd Tuesday of the month 1130am-1pm, Journey Room  > Look Good Feel Better  1st Wednesday of the month 10am-12 noon, Journey Room (Call [place] to register [phone])

## 2016-01-26 NOTE — Progress Notes (Signed)
Dye study performed today on port due to port not giving blood. Catheter tip has a fibrin sheath on it but okay to use. Chemo given as scheduled.   Alteplase given today per Barnetta Chapel Page post chemo completion. Alteplase removed (blood return of 10cc) by Lupita Raider RN. Blood return was wasted.  Neulasta not given today per Dr. Donald Pore orders.   Patient tolerated chemo today without any problems. VSS. Discharged ambulatory with friend in stable condition.

## 2016-01-26 NOTE — Progress Notes (Signed)
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  INTERVAL HISTORY: Holly Stephens 60 y.o. female returns for followup of Hodgkin's Lymphoma, nodular sclerosing-type, classical Hodgkin's Disease with lymphatic involvement in multiple lymph nodes superior and inferior to the diaphragm with PET imaging demonstrating findings concerning for pulmonary involvement (Stage III versus Stage IV disease ?pulmonary nodules x 2?).  Started ABVD on 09/13/2015 with curative intent with the discontinuation of Bleomycin on 10/11/2015.  PET scan after cycle #3 demonstrated Deauville 3 and therefore 4 more cycles of AVD is recommended.  Course complicated by hospitalization for diastolic congestive heart failure now being followed by Dr. Haroldine Laws in addition to HIV+ that is well controlled and followed by Dr. Megan Salon.    [name], nodular sclerosis ([place])   07/23/2015 Procedure    Lext axilla lymph node biopsy      07/27/2015 Pathology Results    Classical [name], nodular sclerosis type.  Positive CD30, CD15, andPAX-5.  Negative CD45 and CD20.      08/31/2015 PET scan    Hypermetabolic lymphadenopathy. Nodal stations include: L supraclavicular nodes, L axillary nodes, mediastinal lymph nodes, periaortic retroperitoneal and periportal lymph nodes as well as L iliac and inguinal lymph nodes. Two concerning pulm nodules...      09/01/2015 Imaging    MUGA- Normal LEFT ventricular ejection fraction of 73%.  Normal LEFT ventricular wall motion.      09/01/2015 Procedure    PFTs-  Spirometry shows mild ventilatory defect without airflow obstruction and no bronchodilator improvement. Lung volumes are normal. Airway resistance is normal. DLCO normal. Essentially normal lung function      09/08/2015  Procedure    Port placed by Dr. Rosana Hoes      09/13/2015 -  Chemotherapy    ABVD      09/27/2015 Imaging    CT angio of chest- no gross PE seen. Improving axillary, mediastinal and upper abdominal adenopathy. Improved and possibly resolved RUL nodule and with resolved or obscured LLL nodule. Small L pleural effusion and mild L lower lobe atelectasis.      10/11/2015 Treatment Plan Change    Bleomycin discontinued       11/01/2015 PET scan    Clear interval response to therapy with decrease in size and hypermetabolism of larger lymph nodes seen previously and some of the smaller hypermetabolic lymph node seen on the previous study have resolved completely in terms of abnormal enlargement and hypermetabolism. 2. Slight decrease in left lower lobe pulmonary nodule with interval resolution of the right upper lobe pulmonary nodule. 3. Stable appearance of mild heterogeneous FDG accumulation within the marrow space.      11/01/2015 Miscellaneous    Deauville 3- 4 more cycles of AVD recommended according to NCCN guidelines.        She saw Dr. Haroldine Laws on 01/21/2016 and his note is reviewed in detail.  Of note, ECHO demonstrates normal EF and no evidence of adriamycin toxicity.  He believes her issue was volume overload with chemotherapy.  She is on Lasix.  ECHO demonstrates RV dilation and following chemotherapy, Dr. Haroldine Laws plans on referring the patient for sleep study for suspect obstructive sleep apnea.  She is doing well.  She feels as though her breathing is good.    She denies any b symptoms.  Weight is stable.  "I think each treatment is getting easier!" she reports.  Review of Systems  Constitutional: Negative.  Negative for chills, fever, malaise/fatigue and weight loss.  HENT: Negative.   Eyes: Negative.   Respiratory: Negative.  Negative for cough.   Cardiovascular: Negative.  Negative for chest pain.  Gastrointestinal: Negative for constipation, diarrhea, nausea and vomiting.   Genitourinary: Negative.  Negative for dysuria.  Musculoskeletal: Negative.   Skin: Negative.  Negative for rash.  Neurological: Negative.  Negative for weakness.  Endo/Heme/Allergies: Negative.   Psychiatric/Behavioral: Negative.     Past Medical History:  Diagnosis Date  . Arthritis    osteoarthritis  . Diabetes mellitus without complication ([place])   . HIV infection ([place])   . [name], nodular sclerosis ([place]) 08/23/2015  . Human immunodeficiency virus (HIV) disease ([place]) 02/23/2006   Qualifier: Diagnosis of  By: Megan Salon MD, John      Past Surgical History:  Procedure Laterality Date  . AXILLARY LYMPH NODE BIOPSY Left 07/23/2015   Procedure: EXCISIONAL BIOPSY LEFT AXILLARY LYMPH NODE;  Surgeon: [name], MD;  Location: [facility];  Service: General;  Laterality: Left;  . PORTACATH PLACEMENT Right 09/08/2015   Procedure: INSERTION OF CENTRAL VENOUS CATHETER WITH PORT FOR CHEMOTHERAPY;  Surgeon: [name], MD;  Location: [facility];  Service: General;  Laterality: Right;  . SHOULDER SURGERY Right    removal of bone spur    Family History  Problem Relation Age of Onset  . Hypertension Mother   . Cancer Mother 41    breast   . Hypertension Father   . Heart attack Father   . Pulmonary embolism Sister   . Parkinson's disease Brother     Social History   Social History  . Marital status: Widowed    Spouse name: N/A  . Number of children: N/A  . Years of education: N/A   Social History Main Topics  . Smoking status: Never Smoker  . Smokeless tobacco: Never Used  . Alcohol use No  . Drug use: No  . Sexual activity: No     Comment: declined condoms   Other Topics Concern  . None   Social History Narrative  . None     PHYSICAL EXAMINATION  ECOG PERFORMANCE STATUS: 1 - Symptomatic but completely ambulatory  Vitals:   01/26/16 0913  BP: (!) 156/73  Pulse: (!) 111  Resp: 18  Temp: 97.4 F (36.3 C)    GENERAL:alert, well nourished, well  developed, comfortable, cooperative, obese, smiling and accompanied by friend. SKIN: skin color, texture, turgor are normal, no rashes or significant lesions HEAD: Normocephalic, No masses, lesions, tenderness or abnormalities EYES: normal, EOMI, Conjunctiva are pink and non-injected EARS: External ears normal OROPHARYNX:lips, buccal mucosa, and tongue normal and mucous membranes are moist  NECK: supple, no adenopathy, trachea midline LYMPH:  Palpable left axillary lymph node. BREAST:not examined LUNGS: clear to auscultation and percussion HEART: regular rate & rhythm, no murmurs, no gallops, S1 normal and S2 normal ABDOMEN:abdomen soft, non-tender, obese and normal bowel sounds BACK: Back symmetric, no curvature., No CVA tenderness EXTREMITIES:less then 2 second capillary refill, no joint deformities, effusion, or inflammation, no skin discoloration, no cyanosis  NEURO: alert & oriented x 3 with fluent speech, no focal motor/sensory deficits, gait normal   LABORATORY DATA: CBC    Component Value Date/Time   WBC 17.0 (H) 01/12/2016 0913   RBC 3.68 (L) 01/12/2016 0913   HGB 11.1 (L) 01/12/2016 0913   HCT  35.7 (L) 01/12/2016 0913   PLT 250 01/12/2016 0913   MCV 97.0 01/12/2016 0913   MCH 30.2 01/12/2016 0913   MCHC 31.1 01/12/2016 0913   RDW 21.1 (H) 01/12/2016 0913   LYMPHSABS 1.7 01/12/2016 0913   MONOABS 1.2 (H) 01/12/2016 0913   EOSABS 0.2 01/12/2016 0913   BASOSABS 0.0 01/12/2016 0913      Chemistry      Component Value Date/Time   NA 138 01/12/2016 0913   K 3.7 01/12/2016 0913   CL 105 01/12/2016 0913   CO2 24 01/12/2016 0913   BUN 9 01/12/2016 0913   CREATININE 0.72 01/12/2016 0913   CREATININE 0.57 12/02/2015 1008      Component Value Date/Time   CALCIUM 9.6 01/12/2016 0913   ALKPHOS 61 01/12/2016 0913   AST 36 01/12/2016 0913   ALT 36 01/12/2016 0913   BILITOT 0.3 01/12/2016 0913        PENDING LABS:   RADIOGRAPHIC STUDIES:  No results  found.   PATHOLOGY:    ASSESSMENT AND PLAN:  [name], nodular sclerosis (HCC) Hodgkin's Lymphoma, nodular sclerosing-type, classical Hodgkin's Disease with lymphatic involvement in multiple lymph nodes superior and inferior to the diaphragm with PET imaging demonstrating findings concerning for pulmonary involvement (Stage III versus Stage IV disease ?pulmonary nodules x 2?).  Started ABVD on 09/13/2015 with curative intent with the discontinuation of Bleomycin on 10/11/2015.  PET scan after cycle #3 demonstrated Deauville 3 and therefore 4 more cycles of AVD is recommended.  Course complicated by hospitalization for diastolic congestive heart failure now being followed by Dr. Haroldine Laws in addition to HIV+ that is well controlled and followed by Dr. Megan Salon.  Oncology history updated.  Pre-treatment labs today: CBC diff, CMET.  I personally reviewed and went over laboratory results with the patient.  The results are noted within this dictation.  Labs are pending at this time and based upon results, will plan on moving forward with the completion of cycle #5 today.  Based upon [facility] we will make a decision regarding the role of Neulasta support today.  She saw Dr. Haroldine Laws on 01/21/2016 for consultation: 1. Chronic diastolic HF --Echo reviewed personally. EF normal. No evidence of adriamycin toxicity. Suspect volume overload due to fluid loading post chemo.  --Volume status much improved with low-dose lasix. Reinforced need for daily weights and reviewed use of sliding scale diuretics. --Recheck echo at end of chemo in January and then 6 months after completion of Adriamycin --Has spiro 50mg  daily on her list but not sure she is taking. If not will start spiro 25 daily and stop potassium.  2. Hodgkin's lymphoma with LN involvement --Receiving ABD through the beginning of 2018 3. Morbid Obesity 4. DM2 5. Mild RV dilation on echo --Possibly OSA. Watch exertional O2 sats will refer for  sleep study when chemo complete.   Return in 2 weeks for follow-up and treatment.   ORDERS PLACED FOR THIS ENCOUNTER: No orders of the defined types were placed in this encounter.   MEDICATIONS PRESCRIBED THIS ENCOUNTER: No orders of the defined types were placed in this encounter.   THERAPY PLAN:  Continue therapy as planned for 6 planned cycles of chemotherapy.  All questions were answered. The patient knows to call the clinic with any problems, questions or concerns. We can certainly see the patient much sooner if necessary.  Patient and plan discussed with Dr. Ancil Linsey and she is in agreement with the aforementioned.   This note is electronically  signed by: Doy Mince 01/26/2016 9:31 AM

## 2016-02-08 ENCOUNTER — Telehealth (HOSPITAL_COMMUNITY): Payer: Self-pay

## 2016-02-08 MED ORDER — SPIRONOLACTONE 25 MG PO TABS: 25.0000 mg | ORAL_TABLET | Freq: Every day | ORAL | 3 refills | Status: DC

## 2016-02-08 NOTE — Telephone Encounter (Signed)
Patient calling CHF clinic to request refill on spironolactone. [facility]  Renee Pain, RN

## 2016-02-09 ENCOUNTER — Encounter (HOSPITAL_COMMUNITY): Payer: Self-pay

## 2016-02-09 ENCOUNTER — Encounter (HOSPITAL_BASED_OUTPATIENT_CLINIC_OR_DEPARTMENT_OTHER): Payer: 59 | Admitting: Oncology

## 2016-02-09 ENCOUNTER — Encounter (HOSPITAL_BASED_OUTPATIENT_CLINIC_OR_DEPARTMENT_OTHER): Payer: 59

## 2016-02-09 ENCOUNTER — Other Ambulatory Visit (HOSPITAL_COMMUNITY): Payer: Self-pay | Admitting: Oncology

## 2016-02-09 ENCOUNTER — Ambulatory Visit (HOSPITAL_COMMUNITY)
Admission: RE | Admit: 2016-02-09 | Discharge: 2016-02-09 | Disposition: A | Discharge status: Home / Self Care | Payer: 59 | Source: Ambulatory Visit | Attending: Oncology | Admitting: Oncology

## 2016-02-09 ENCOUNTER — Ambulatory Visit (HOSPITAL_COMMUNITY): Payer: 59 | Admitting: Oncology

## 2016-02-09 VITALS — BP 135/62 | HR 92 | Temp 97.9°F | Resp 18 | Wt 255.6 lb

## 2016-02-09 DIAGNOSIS — Z5111 Encounter for antineoplastic chemotherapy: Secondary | ICD-10-CM | POA: Diagnosis not present

## 2016-02-09 DIAGNOSIS — R600 Localized edema: Secondary | ICD-10-CM

## 2016-02-09 DIAGNOSIS — I82432 Acute embolism and thrombosis of left popliteal vein: Secondary | ICD-10-CM

## 2016-02-09 DIAGNOSIS — C8118 Nodular sclerosis classical Hodgkin lymphoma, lymph nodes of multiple sites: Secondary | ICD-10-CM

## 2016-02-09 DIAGNOSIS — C8110 Nodular sclerosis classical Hodgkin lymphoma, unspecified site: Secondary | ICD-10-CM

## 2016-02-09 DIAGNOSIS — I82412 Acute embolism and thrombosis of left femoral vein: Secondary | ICD-10-CM | POA: Diagnosis not present

## 2016-02-09 HISTORY — DX: Acute embolism and thrombosis of left popliteal vein: I82.432

## 2016-02-09 LAB — CBC WITH DIFFERENTIAL/PLATELET
BASOS PCT: 1 %
Basophils Absolute: 0.1 10*3/uL (ref 0.0–0.1)
EOS ABS: 0.2 10*3/uL (ref 0.0–0.7)
Eosinophils Relative: 4 %
HEMATOCRIT: 36.3 % (ref 36.0–46.0)
HEMOGLOBIN: 11.4 g/dL — AB (ref 12.0–15.0)
LYMPHS ABS: 1.1 10*3/uL (ref 0.7–4.0)
Lymphocytes Relative: 26 %
MCH: 30.2 pg (ref 26.0–34.0)
MCHC: 31.4 g/dL (ref 30.0–36.0)
MCV: 96.3 fL (ref 78.0–100.0)
MONO ABS: 0.7 10*3/uL (ref 0.1–1.0)
MONOS PCT: 17 %
NEUTROS PCT: 52 %
Neutro Abs: 2.2 10*3/uL (ref 1.7–7.7)
Platelets: 335 10*3/uL (ref 150–400)
RBC: 3.77 MIL/uL — ABNORMAL LOW (ref 3.87–5.11)
RDW: 16.6 % — AB (ref 11.5–15.5)
WBC: 4.2 10*3/uL (ref 4.0–10.5)

## 2016-02-09 LAB — COMPREHENSIVE METABOLIC PANEL
ALBUMIN: 3.6 g/dL (ref 3.5–5.0)
ALK PHOS: 33 U/L — AB (ref 38–126)
ALT: 30 U/L (ref 14–54)
ANION GAP: 11 (ref 5–15)
AST: 30 U/L (ref 15–41)
BILIRUBIN TOTAL: 0.4 mg/dL (ref 0.3–1.2)
BUN: 11 mg/dL (ref 6–20)
CALCIUM: 9.3 mg/dL (ref 8.9–10.3)
CO2: 25 mmol/L (ref 22–32)
Chloride: 104 mmol/L (ref 101–111)
Creatinine, Ser: 0.8 mg/dL (ref 0.44–1.00)
GFR calc non Af Amer: >60 mL/min (ref >60)
Glucose, Bld: 168 mg/dL — ABNORMAL HIGH (ref 65–99)
POTASSIUM: 3.5 mmol/L (ref 3.5–5.1)
SODIUM: 140 mmol/L (ref 135–145)
TOTAL PROTEIN: 6.6 g/dL (ref 6.5–8.1)

## 2016-02-09 MED ORDER — SODIUM CHLORIDE 0.9% FLUSH: 10.0000 mL | INTRAVENOUS | Status: DC | PRN

## 2016-02-09 MED ORDER — PALONOSETRON HCL INJECTION 0.25 MG/5ML
0.2500 mg | Freq: Once | INTRAVENOUS | Status: AC
  Administered 2016-02-09: 0.25 mg via INTRAVENOUS

## 2016-02-09 MED ORDER — SODIUM CHLORIDE 0.9 % IV SOLN
Freq: Once | INTRAVENOUS | Status: AC
  Administered 2016-02-09: 10:00:00 via INTRAVENOUS
  Filled 2016-02-09: qty 5

## 2016-02-09 MED ORDER — RIVAROXABAN 20 MG PO TABS: 20.0000 mg | ORAL_TABLET | Freq: Every day | ORAL | 2 refills | Status: DC

## 2016-02-09 MED ORDER — PEGFILGRASTIM 6 MG/0.6ML ~~LOC~~ PSKT
PREFILLED_SYRINGE | SUBCUTANEOUS | Status: AC
  Filled 2016-02-09: qty 0.6

## 2016-02-09 MED ORDER — PALONOSETRON HCL INJECTION 0.25 MG/5ML
INTRAVENOUS | Status: AC
  Filled 2016-02-09: qty 5

## 2016-02-09 MED ORDER — SODIUM CHLORIDE 0.9 % IV SOLN
Freq: Once | INTRAVENOUS | Status: AC
  Administered 2016-02-09: 10:00:00 via INTRAVENOUS

## 2016-02-09 MED ORDER — ENOXAPARIN SODIUM 100 MG/ML ~~LOC~~ SOLN
170.0000 mg | Freq: Once | SUBCUTANEOUS | Status: AC
  Administered 2016-02-09: 170 mg via SUBCUTANEOUS

## 2016-02-09 MED ORDER — HEPARIN SOD (PORK) LOCK FLUSH 100 UNIT/ML IV SOLN
500.0000 [IU] | Freq: Once | INTRAVENOUS | Status: AC | PRN
  Administered 2016-02-09: 500 [IU]

## 2016-02-09 MED ORDER — VINBLASTINE SULFATE CHEMO INJECTION 1 MG/ML
6.0000 mg/m2 | Freq: Once | INTRAVENOUS | Status: AC
  Administered 2016-02-09: 13.6 mg via INTRAVENOUS
  Filled 2016-02-09: qty 13.6

## 2016-02-09 MED ORDER — PEGFILGRASTIM 6 MG/0.6ML ~~LOC~~ PSKT
6.0000 mg | PREFILLED_SYRINGE | Freq: Once | SUBCUTANEOUS | Status: AC
  Administered 2016-02-09: 6 mg via SUBCUTANEOUS

## 2016-02-09 MED ORDER — DOXORUBICIN HCL CHEMO IV INJECTION 2 MG/ML
25.0000 mg/m2 | Freq: Once | INTRAVENOUS | Status: AC
  Administered 2016-02-09: 56 mg via INTRAVENOUS
  Filled 2016-02-09: qty 28

## 2016-02-09 MED ORDER — RIVAROXABAN 15 MG PO TABS: 15.0000 mg | ORAL_TABLET | Freq: Two times a day (BID) | ORAL | 0 refills | Status: DC

## 2016-02-09 MED ORDER — HEPARIN SOD (PORK) LOCK FLUSH 100 UNIT/ML IV SOLN
INTRAVENOUS | Status: AC
  Filled 2016-02-09: qty 5

## 2016-02-09 MED ORDER — SODIUM CHLORIDE 0.9 % IV SOLN
375.0000 mg/m2 | Freq: Once | INTRAVENOUS | Status: AC
  Administered 2016-02-09: 860 mg via INTRAVENOUS
  Filled 2016-02-09: qty 43

## 2016-02-09 NOTE — Progress Notes (Signed)
Patient has slightly more swelling and redness to LLE and she reports that she has had some pain.  Robynn Pane, PA notified and an order for LLE ultrasound to rule out DVT was obtained.  Patient taken down to radiology for testing prior to treatment.  Patient given Lovenox 170mg  SQ in abdomen per PA order.  Patient and friend also educated on s/s PE and that it is an emergency.  They both verbalized understanding.  Patient tolerated infusion well.  VSS.  Patient ambulatory and stable upon discharge form clinic.

## 2016-02-09 NOTE — Progress Notes (Signed)
Patient is seen as a work-in today.  She is here for treatment.  Nursing noted a unilateral leg swelling and therefore reported to me.  [place] of left leg performed prior to initiation of treatment today.  Holly Stephens reports a left leg discomfort for days.  She notes that it is warm to the touch.  Pain in the in left calf.  She notes it is worse with ambulation.    Vitals: Vitals - 1 value per visit [id number]  SYSTOLIC 0000000  DIASTOLIC 65  Pulse [id number]  Temperature 97.7  Respirations 18  Gen: NAD, pleasant, smiling, friend at bedside, in chemo-bed. Skin: Warm and dry HEENT: Atraumatic, normocephalic Neuro: A and O x 3 Extremities: Left lower extremity is hot to the touch with edema, erythema, and tenderness in calf.  FINDINGS: Incompletely occlusive noncompressible thrombus in the left femoral vein. Occlusive noncompressible thrombus in the popliteal vein extending into peroneal and posterior tibial veins in the calf. Patent left common femoral and profunda femoral veins as well as the saphenofemoral junction. Survey views of the contralateral common femoral vein are unremarkable.  IMPRESSION: 1. Positive for acute DVT in left calf, popliteal and femoral veins.  Assessment: 1. Left popliteal DVT  Plan: 1. I personally reviewed and went over radiographic studies with the patient.  The results are noted within this dictation.  [place] confirms acute DVT in left leg. 2. Lovenox 1.5 mg/kg administered today. 3. Xarelto 15 mg BID x 21 days samples given to the patient.  She is advised to start this tomorrow AM. 4. Rx for Xarelto 20 mg daily escribed to her pharmacy.  This will begin on 03/03/2016. 5. Patient information provided to the patient regarding Xarelto. 6. I discussed the risks, benefits, alternatives, and side effects of Xarelto anticoagulation.  I have confirmed that there is no interaction with her HIV medications. 7. I have messaged Dr. Megan Salon (ID) about this updated information. 8.  She will return in 2 weeks for her last treatment, follow-up, and further medical oncology recommendations as scheduled.  Patient and plan discussed with Dr. Ancil Linsey and she is in agreement with the aforementioned.   Holly Pane, PA-C 02/09/2016 12:54 PM

## 2016-02-23 ENCOUNTER — Encounter (HOSPITAL_COMMUNITY): Payer: 59 | Attending: Hematology & Oncology | Admitting: Oncology

## 2016-02-23 ENCOUNTER — Encounter (HOSPITAL_BASED_OUTPATIENT_CLINIC_OR_DEPARTMENT_OTHER): Payer: 59

## 2016-02-23 ENCOUNTER — Encounter (HOSPITAL_COMMUNITY): Payer: Self-pay | Admitting: Oncology

## 2016-02-23 VITALS — BP 136/72 | HR 91 | Temp 97.7°F | Resp 18 | Wt 254.6 lb

## 2016-02-23 DIAGNOSIS — Z7901 Long term (current) use of anticoagulants: Secondary | ICD-10-CM | POA: Diagnosis not present

## 2016-02-23 DIAGNOSIS — I82432 Acute embolism and thrombosis of left popliteal vein: Secondary | ICD-10-CM | POA: Diagnosis not present

## 2016-02-23 DIAGNOSIS — C8118 Nodular sclerosis classical Hodgkin lymphoma, lymph nodes of multiple sites: Secondary | ICD-10-CM | POA: Insufficient documentation

## 2016-02-23 DIAGNOSIS — C8110 Nodular sclerosis classical Hodgkin lymphoma, unspecified site: Secondary | ICD-10-CM

## 2016-02-23 DIAGNOSIS — Z5111 Encounter for antineoplastic chemotherapy: Secondary | ICD-10-CM

## 2016-02-23 LAB — COMPREHENSIVE METABOLIC PANEL
ALK PHOS: 49 U/L (ref 38–126)
ALT: 41 U/L (ref 14–54)
AST: 43 U/L — AB (ref 15–41)
Albumin: 3.7 g/dL (ref 3.5–5.0)
Anion gap: 7 (ref 5–15)
BILIRUBIN TOTAL: 0.3 mg/dL (ref 0.3–1.2)
BUN: 11 mg/dL (ref 6–20)
CHLORIDE: 104 mmol/L (ref 101–111)
CO2: 29 mmol/L (ref 22–32)
Calcium: 9.4 mg/dL (ref 8.9–10.3)
Creatinine, Ser: 0.65 mg/dL (ref 0.44–1.00)
GFR calc Af Amer: >60 mL/min (ref >60)
Glucose, Bld: 150 mg/dL — ABNORMAL HIGH (ref 65–99)
Potassium: 3.4 mmol/L — ABNORMAL LOW (ref 3.5–5.1)
Sodium: 140 mmol/L (ref 135–145)
Total Protein: 6.6 g/dL (ref 6.5–8.1)

## 2016-02-23 LAB — CBC WITH DIFFERENTIAL/PLATELET
Basophils Absolute: 0.1 10*3/uL (ref 0.0–0.1)
Basophils Relative: 1 %
EOS ABS: 0.1 10*3/uL (ref 0.0–0.7)
EOS PCT: 1 %
HCT: 38.5 % (ref 36.0–46.0)
Hemoglobin: 12.2 g/dL (ref 12.0–15.0)
LYMPHS ABS: 1.5 10*3/uL (ref 0.7–4.0)
Lymphocytes Relative: 15 %
MCH: 30.5 pg (ref 26.0–34.0)
MCHC: 31.7 g/dL (ref 30.0–36.0)
MCV: 96.3 fL (ref 78.0–100.0)
Monocytes Absolute: 0.6 10*3/uL (ref 0.1–1.0)
Monocytes Relative: 6 %
Neutro Abs: 8.2 10*3/uL — ABNORMAL HIGH (ref 1.7–7.7)
Neutrophils Relative %: 77 %
PLATELETS: 270 10*3/uL (ref 150–400)
RBC: 4 MIL/uL (ref 3.87–5.11)
RDW: 16.8 % — ABNORMAL HIGH (ref 11.5–15.5)
WBC: 10.5 10*3/uL (ref 4.0–10.5)

## 2016-02-23 MED ORDER — HEPARIN SOD (PORK) LOCK FLUSH 100 UNIT/ML IV SOLN
500.0000 [IU] | Freq: Once | INTRAVENOUS | Status: AC | PRN
  Administered 2016-02-23: 500 [IU]

## 2016-02-23 MED ORDER — SODIUM CHLORIDE 0.9 % IV SOLN
Freq: Once | INTRAVENOUS | Status: AC
  Administered 2016-02-23: 11:00:00 via INTRAVENOUS

## 2016-02-23 MED ORDER — SODIUM CHLORIDE 0.9% FLUSH
10.0000 mL | INTRAVENOUS | Status: DC | PRN
  Administered 2016-02-23: 10 mL
  Filled 2016-02-23: qty 10

## 2016-02-23 MED ORDER — SODIUM CHLORIDE 0.9 % IV SOLN
6.0000 mg/m2 | Freq: Once | INTRAVENOUS | Status: AC
  Administered 2016-02-23: 13.6 mg via INTRAVENOUS
  Filled 2016-02-23: qty 13.6

## 2016-02-23 MED ORDER — PALONOSETRON HCL INJECTION 0.25 MG/5ML
0.2500 mg | Freq: Once | INTRAVENOUS | Status: AC
  Administered 2016-02-23: 0.25 mg via INTRAVENOUS
  Filled 2016-02-23: qty 5

## 2016-02-23 MED ORDER — DACARBAZINE 200 MG IV SOLR
375.0000 mg/m2 | Freq: Once | INTRAVENOUS | Status: AC
  Administered 2016-02-23: 860 mg via INTRAVENOUS
  Filled 2016-02-23: qty 43

## 2016-02-23 MED ORDER — SODIUM CHLORIDE 0.9 % IV SOLN
Freq: Once | INTRAVENOUS | Status: AC
  Administered 2016-02-23: 11:00:00 via INTRAVENOUS
  Filled 2016-02-23: qty 5

## 2016-02-23 MED ORDER — DOXORUBICIN HCL CHEMO IV INJECTION 2 MG/ML
25.0000 mg/m2 | Freq: Once | INTRAVENOUS | Status: AC
  Administered 2016-02-23: 56 mg via INTRAVENOUS
  Filled 2016-02-23: qty 28

## 2016-02-23 NOTE — Assessment & Plan Note (Addendum)
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  Started ABVD on 09/13/2015 with curative intent with the discontinuation of Bleomycin on 10/11/2015.  PET scan after cycle #3 demonstrated Deauville 3 and therefore 4 more cycles of AVD is recommended.  Course complicated by hospitalization for diastolic congestive heart failure now being followed by Dr. Haroldine Laws in addition to HIV+ that is well controlled and followed by Dr. Megan Salon.  Oncology history updated.  Pre-treatment labs today: CBC diff, CMET.  I personally reviewed and went over laboratory results with the patient.  The results are noted within this dictation.  Will HOLD Neulasta today.  She saw Dr. Haroldine Laws on 01/21/2016 for consultation: 1. Chronic diastolic HF --Echo reviewed personally. EF normal. No evidence of adriamycin toxicity. Suspect volume overload due to fluid loading post chemo.  --Volume status much improved with low-dose lasix. Reinforced need for daily weights and reviewed use of sliding scale diuretics. --Recheck echo at end of chemo in January and then 6 months after completion of Adriamycin --Has spiro 50mg  daily on her list but not sure she is taking. If not will start spiro 25 daily and stop potassium.  2. Hodgkin's lymphoma with LN involvement --Receiving ABD through the beginning of 2018 3. Morbid Obesity 4. DM2 5. Mild RV dilation on echo --Possibly OSA. Watch exertional O2 sats will refer for sleep study when chemo complete.   Today is her last treatment.  She will call when she needs a refill on her Lasix.  She is changing pharmacy per [facility] insurance [facility]  [facility]    Order place for restaging PET which is to be  completed in 2 weeks.  Return following PET imaging to review results.  We discussed XRT.  She would like to return to work on 03/06/2016 half days.  She works in [employer].  Therefore, she thinks that having XRT in [place] would be most appropriate.

## 2016-02-23 NOTE — Patient Instructions (Addendum)
[place] at [facility] Discharge Instructions  RECOMMENDATIONS MADE BY THE CONSULTANT AND ANY TEST RESULTS WILL BE SENT TO YOUR REFERRING PHYSICIAN.  Continue Xarelto as prescribed - Increase Xarelto to 20mg  every day in 1 week  PET in 2 weeks  Return follow up in 2 weeks  Thank you for choosing [place] at [facility] to provide your oncology and hematology care.  To afford each patient quality time with our provider, please arrive at least 15 minutes before your scheduled appointment time.    If you have a lab appointment with the [facility] please come in thru the  Main Entrance and check in at the main information desk  You need to re-schedule your appointment should you arrive 10 or more minutes late.  We strive to give you quality time with our providers, and arriving late affects you and other patients whose appointments are after yours.  Also, if you no show three or more times for appointments you may be dismissed from the clinic at the providers discretion.     Again, thank you for choosing [facility].  Our hope is that these requests will decrease the amount of time that you wait before being seen by our physicians.       _____________________________________________________________  Should you have questions after your visit to [facility], please contact our office at [phone] between the hours of 8:30 a.m. and 4:30 p.m.  Voicemails left after 4:30 p.m. will not be returned until the following business day.  For prescription refill requests, have your pharmacy contact our office.       Resources For Cancer Patients and their Caregivers ? American Cancer Society: Can assist with transportation, wigs, general needs, runs Look Good Feel Better.        714-218-2218 ? Cancer Care: Provides financial assistance, online support groups, medication/co-pay assistance.  1-800-813-HOPE  520-022-0577) ? [place] Assists [place] cancer patients and their families through emotional , educational and financial support.  215 639 9746 ? [place] DSS Where to apply for food stamps, Medicaid and utility assistance. 660-734-2641 ? [employer]: Transportation to medical appointments. (939)200-1311 ? Social Security Administration: May apply for disability if have a Stage IV cancer. 442-203-6756 (343)430-9512 ? [employer], Disability and Transit Services: Assists with nutrition, care and transit needs. [place] Support Programs: @10RELATIVEDAYS @ > Cancer Support Group  2nd Tuesday of the month 1pm-2pm, Journey Room  > Creative Journey  3rd Tuesday of the month 1130am-1pm, Journey Room  > Look Good Feel Better  1st Wednesday of the month 10am-12 noon, Journey Room (Call [place] to register [phone])

## 2016-02-23 NOTE — Progress Notes (Signed)
Chemotherapy given today per orders. Patient tolerated it well, no problems. Vitals stable and discharged home with friend ambulatory from clinic.follow up as scheduled.

## 2016-02-23 NOTE — Assessment & Plan Note (Addendum)
Left lower extremity DVT on 02/09/2016 in the setting of malignancy and treatment, on Xarelto anticoagulation beginning on 02/09/2016.  She notes that a 90 day supply of Xarelto cost her $100.  She reports that this is affordable.  She is advised to let us know if the affordability of Xarelto is an issue moving forward.  Risks, benefits, alternatives, and side effects of Xarelto reviewed.

## 2016-02-23 NOTE — Progress Notes (Signed)
Holly Neighbors, MD [place] [address]  Nodular sclerosing Hodgkin's lymphoma, unspecified body region [facility]) - Plan: NM PET Image Restag (PS) Skull Base To Thigh  Acute deep vein thrombosis (DVT) of popliteal vein of left lower extremity ([place])  CURRENT THERAPY: AVD due to adverse reaction to bleomycin, beginning on 09/13/2015.  INTERVAL HISTORY: Holly Stephens 61 y.o. female returns for followup of Hodgkin's Lymphoma, nodular sclerosing-type, classical Hodgkin's Disease with lymphatic involvement in multiple lymph nodes superior and inferior to the diaphragm with PET imaging demonstrating findings concerning for pulmonary involvement (Stage III versus Stage IV disease ?pulmonary nodules x 2?).  Started ABVD on 09/13/2015 with curative intent with the discontinuation of Bleomycin on 10/11/2015.  PET scan after cycle #3 demonstrated Deauville 3 and therefore 4 more cycles of AVD is recommended.  Course complicated by hospitalization for diastolic congestive heart failure now being followed by Dr. Haroldine Laws in addition to HIV+ that is well controlled and followed by Dr. Megan Salon. AND Left lower extremity DVT on 02/09/2016, on Xarelto anticoagulation.    [name], nodular sclerosis ([place])   07/23/2015 Procedure    Lext axilla lymph node biopsy      07/27/2015 Pathology Results    Classical [name], nodular sclerosis type.  Positive CD30, CD15, andPAX-5.  Negative CD45 and CD20.      08/31/2015 PET scan    Hypermetabolic lymphadenopathy. Nodal stations include: L supraclavicular nodes, L axillary nodes, mediastinal lymph nodes, periaortic retroperitoneal and periportal lymph nodes as well as L iliac and inguinal lymph nodes. Two concerning pulm nodules...      09/01/2015 Imaging    MUGA- Normal LEFT ventricular ejection fraction of 73%.  Normal LEFT ventricular wall motion.      09/01/2015 Procedure    PFTs-  Spirometry shows mild ventilatory defect  without airflow obstruction and no bronchodilator improvement. Lung volumes are normal. Airway resistance is normal. DLCO normal. Essentially normal lung function      09/08/2015 Procedure    Port placed by Dr. Rosana Hoes      09/13/2015 -  Chemotherapy    ABVD      09/27/2015 Imaging    CT angio of chest- no gross PE seen. Improving axillary, mediastinal and upper abdominal adenopathy. Improved and possibly resolved RUL nodule and with resolved or obscured LLL nodule. Small L pleural effusion and mild L lower lobe atelectasis.      10/11/2015 Treatment Plan Change    Bleomycin discontinued       11/01/2015 PET scan    Clear interval response to therapy with decrease in size and hypermetabolism of larger lymph nodes seen previously and some of the smaller hypermetabolic lymph node seen on the previous study have resolved completely in terms of abnormal enlargement and hypermetabolism. 2. Slight decrease in left lower lobe pulmonary nodule with interval resolution of the right upper lobe pulmonary nodule. 3. Stable appearance of mild heterogeneous FDG accumulation within the marrow space.      11/01/2015 Miscellaneous    Deauville 3- 4 more cycles of AVD recommended according to NCCN guidelines.      02/09/2016 Imaging    [place] left leg- Positive for acute DVT in left calf, popliteal and femoral veins.        She saw Dr. Haroldine Laws on 01/21/2016 and his note is reviewed in detail.  Of note, ECHO demonstrates normal EF and no evidence of adriamycin toxicity.  He believes her issue was volume  overload with chemotherapy.  She is on Lasix.  ECHO demonstrates RV dilation and following chemotherapy, Dr. Haroldine Laws plans on referring the patient for sleep study for suspect obstructive sleep apnea.  She is tolerating treatment well.  She is here for her last treatment.  She is tolerating Xarelto well.  She is about to transition to 20 mg of Xarelto daily in about 1 week.  She reports improvement in her  left leg pain.  Edema is persistent, but improved as well.    Review of Systems  Constitutional: Negative.  Negative for chills, fever, malaise/fatigue and weight loss.  HENT: Negative.   Eyes: Negative.   Respiratory: Negative.  Negative for cough.   Cardiovascular: Positive for leg swelling (left leg edema ). Negative for chest pain.  Gastrointestinal: Negative for constipation, diarrhea, nausea and vomiting.  Genitourinary: Negative.  Negative for dysuria.  Musculoskeletal: Negative.   Skin: Negative.  Negative for rash.  Neurological: Negative.  Negative for weakness.  Endo/Heme/Allergies: Negative.   Psychiatric/Behavioral: Negative.     Past Medical History:  Diagnosis Date  . Acute deep vein thrombosis (DVT) of popliteal vein of left lower extremity ([place]) 02/09/2016  . Arthritis    osteoarthritis  . Diabetes mellitus without complication ([place])   . HIV infection ([place])   . [name], nodular sclerosis ([place]) 08/23/2015  . Human immunodeficiency virus (HIV) disease ([place]) 02/23/2006   Qualifier: Diagnosis of  By: Megan Salon MD, John      Past Surgical History:  Procedure Laterality Date  . AXILLARY LYMPH NODE BIOPSY Left 07/23/2015   Procedure: EXCISIONAL BIOPSY LEFT AXILLARY LYMPH NODE;  Surgeon: [name], MD;  Location: [facility];  Service: General;  Laterality: Left;  . PORTACATH PLACEMENT Right 09/08/2015   Procedure: INSERTION OF CENTRAL VENOUS CATHETER WITH PORT FOR CHEMOTHERAPY;  Surgeon: [name], MD;  Location: [facility];  Service: General;  Laterality: Right;  . SHOULDER SURGERY Right    removal of bone spur    Family History  Problem Relation Age of Onset  . Hypertension Mother   . Cancer Mother 49    breast   . Hypertension Father   . Heart attack Father   . Pulmonary embolism Sister   . Parkinson's disease Brother     Social History   Social History  . Marital status: Widowed    Spouse name: N/A  . Number of children: N/A  . Years of  education: N/A   Social History Main Topics  . Smoking status: Never Smoker  . Smokeless tobacco: Never Used  . Alcohol use No  . Drug use: No  . Sexual activity: No     Comment: declined condoms   Other Topics Concern  . Not on file   Social History Narrative  . No narrative on file     PHYSICAL EXAMINATION  ECOG PERFORMANCE STATUS: 1 - Symptomatic but completely ambulatory  There were no vitals filed for this visit.  Vitals - 1 value per visit [id number]  SYSTOLIC [id number]  DIASTOLIC 48  Pulse 92  Temperature 97.8  Respirations 18  Weight (lb) 254.6    GENERAL:alert, well nourished, well developed, comfortable, cooperative, obese, smiling and accompanied by friend. SKIN: skin color, texture, turgor are normal, no rashes or significant lesions HEAD: Normocephalic, No masses, lesions, tenderness or abnormalities EYES: normal, EOMI, Conjunctiva are pink and non-injected EARS: External ears normal OROPHARYNX:lips, buccal mucosa, and tongue normal and mucous membranes are moist  NECK: supple, no adenopathy, trachea midline  LYMPH:  Left axillary mass not palpable on exam today. BREAST:not examined LUNGS: clear to auscultation and percussion HEART: regular rate & rhythm, no murmurs, no gallops, S1 normal and S2 normal ABDOMEN:abdomen soft, non-tender, obese and normal bowel sounds BACK: Back symmetric, no curvature., No CVA tenderness EXTREMITIES:less then 2 second capillary refill, no joint deformities, effusion, or inflammation, no skin discoloration, no cyanosis  NEURO: alert & oriented x 3 with fluent speech, no focal motor/sensory deficits, gait normal   LABORATORY DATA: CBC    Component Value Date/Time   WBC 4.2 02/09/2016 0905   RBC 3.77 (L) 02/09/2016 0905   HGB 11.4 (L) 02/09/2016 0905   HCT 36.3 02/09/2016 0905   PLT 335 02/09/2016 0905   MCV 96.3 02/09/2016 0905   MCH 30.2 02/09/2016 0905   MCHC 31.4 02/09/2016 0905   RDW 16.6 (H) 02/09/2016 0905    LYMPHSABS 1.1 02/09/2016 0905   MONOABS 0.7 02/09/2016 0905   EOSABS 0.2 02/09/2016 0905   BASOSABS 0.1 02/09/2016 0905      Chemistry      Component Value Date/Time   NA 140 02/09/2016 0905   K 3.5 02/09/2016 0905   CL 104 02/09/2016 0905   CO2 25 02/09/2016 0905   BUN 11 02/09/2016 0905   CREATININE 0.80 02/09/2016 0905   CREATININE 0.57 12/02/2015 1008      Component Value Date/Time   CALCIUM 9.3 02/09/2016 0905   ALKPHOS 33 (L) 02/09/2016 0905   AST 30 02/09/2016 0905   ALT 30 02/09/2016 0905   BILITOT 0.4 02/09/2016 0905        PENDING LABS:   RADIOGRAPHIC STUDIES:  US Venous Img Lower Unilateral Left  Result Date: 02/09/2016 CLINICAL DATA:  Redness and pain x3 days, obesity. EXAM: LEFT LOWER EXTREMITY VENOUS DOPPLER ULTRASOUND TECHNIQUE: Gray-scale sonography with compression, as well as color and duplex ultrasound, were performed to evaluate the deep venous system from the level of the common femoral vein through the popliteal and proximal calf veins. COMPARISON:  None FINDINGS: Incompletely occlusive noncompressible thrombus in the left femoral vein. Occlusive noncompressible thrombus in the popliteal vein extending into peroneal and posterior tibial veins in the calf. Patent left common femoral and profunda femoral veins as well as the saphenofemoral junction. Survey views of the contralateral common femoral vein are unremarkable. IMPRESSION: 1. Positive for acute DVT in left calf, popliteal and femoral veins. Electronically Signed   By: Lucrezia Europe M.D.   On: 02/09/2016 11:28   Dg Cv Line Injection  Result Date: 01/26/2016 INDICATION: Port injection. EXAM: IMPLANTED PORT A CATH INJECTION MEDICATIONS: None. ANESTHESIA/SEDATION: None. FLUOROSCOPY TIME:  0 minutes, 20 seconds ([id number]) COMPLICATIONS: None immediate. PROCEDURE: Standard nonionic contrast administered through the existing PowerPort catheter and multiple images obtained in the AP and oblique projection.  Fibrin sheath was noted. Catheter intact. IMPRESSION: Fibrin sheath is noted over the distal aspect of the PowerPort catheter. This results and restricted flow. The catheter is intact. No catheter fracture noted. Electronically Signed   By: Marcello Moores  Register   On: 01/26/2016 11:38     PATHOLOGY:    ASSESSMENT AND PLAN:  [name], nodular sclerosis ([place]) Hodgkin's Lymphoma, nodular sclerosing-type, classical Hodgkin's Disease with lymphatic involvement in multiple lymph nodes superior and inferior to the diaphragm with PET imaging demonstrating findings concerning for pulmonary involvement (Stage III versus Stage IV disease ?pulmonary nodules x 2?).  Started ABVD on 09/13/2015 with curative intent with the discontinuation of Bleomycin on 10/11/2015.  PET scan  after cycle #3 demonstrated Deauville 3 and therefore 4 more cycles of AVD is recommended.  Course complicated by hospitalization for diastolic congestive heart failure now being followed by Dr. Haroldine Laws in addition to HIV+ that is well controlled and followed by Dr. Megan Salon.  Oncology history updated.  Pre-treatment labs today: CBC diff, CMET.  I personally reviewed and went over laboratory results with the patient.  The results are noted within this dictation.  Will HOLD Neulasta today.  She saw Dr. Haroldine Laws on 01/21/2016 for consultation: 1. Chronic diastolic HF --Echo reviewed personally. EF normal. No evidence of adriamycin toxicity. Suspect volume overload due to fluid loading post chemo.  --Volume status much improved with low-dose lasix. Reinforced need for daily weights and reviewed use of sliding scale diuretics. --Recheck echo at end of chemo in January and then 6 months after completion of Adriamycin --Has spiro 50mg  daily on her list but not sure she is taking. If not will start spiro 25 daily and stop potassium.  2. Hodgkin's lymphoma with LN involvement --Receiving ABD through the beginning of 2018 3. Morbid  Obesity 4. DM2 5. Mild RV dilation on echo --Possibly OSA. Watch exertional O2 sats will refer for sleep study when chemo complete.   Today is her last treatment.  She will call when she needs a refill on her Lasix.  She is changing pharmacy per her insurance [facility]  [facility] from [facility] list.    Order place for restaging PET which is to be completed in 2 weeks.  Return following PET imaging to review results.  We discussed XRT.  She would like to return to work on 03/06/2016 half days.  She works in [employer].  Therefore, she thinks that having XRT in [place] would be most appropriate.  Acute deep vein thrombosis (DVT) of popliteal vein of left lower extremity (HCC) Left lower extremity DVT on 02/09/2016 in the setting of malignancy and treatment, on Xarelto anticoagulation beginning on 02/09/2016.  She notes that a 90 day supply of Xarelto cost her $100.  She reports that this is affordable.  She is advised to let us know if the affordability of Xarelto is an issue moving forward.  Risks, benefits, alternatives, and side effects of Xarelto reviewed.     ORDERS PLACED FOR THIS ENCOUNTER: Orders Placed This Encounter  Procedures  . NM PET Image Restag (PS) Skull Base To Thigh    MEDICATIONS PRESCRIBED THIS ENCOUNTER: No orders of the defined types were placed in this encounter.   THERAPY PLAN:  Complete therapy TODAY as planned for 6 planned cycles of chemotherapy.  She will undergo restaging with PET and if residual disease persists, she will be referred to XRT.  All questions were answered. The patient knows to call the clinic with any problems, questions or concerns. We can certainly see the patient much sooner if necessary.  Patient and plan discussed with Dr. Ancil Linsey and she is in agreement with the aforementioned.   This note is electronically signed by: Doy Mince 02/23/2016 9:56 AM

## 2016-02-23 NOTE — Patient Instructions (Signed)
[facility] Discharge Instructions for Patients Receiving Chemotherapy   Beginning January 23rd 2017 lab work for the Cancer Center will be done in the  Main lab at [facility] on 1st floor. If you have a lab appointment with the Cancer Center please come in thru the  Main Entrance and check in at the main information desk   Today you received the following chemotherapy   To help prevent nausea and vomiting after your treatment, we encourage you to take your nausea medication    If you develop nausea and vomiting, or diarrhea that is not controlled by your medication, call the clinic.  The clinic phone number is [phone]. Office hours are Monday-Friday 8:30am-5:00pm.  BELOW ARE SYMPTOMS THAT SHOULD BE REPORTED IMMEDIATELY:  *FEVER GREATER THAN 101.0 F  *CHILLS WITH OR WITHOUT FEVER  NAUSEA AND VOMITING THAT IS NOT CONTROLLED WITH YOUR NAUSEA MEDICATION  *UNUSUAL SHORTNESS OF BREATH  *UNUSUAL BRUISING OR BLEEDING  TENDERNESS IN MOUTH AND THROAT WITH OR WITHOUT PRESENCE OF ULCERS  *URINARY PROBLEMS  *BOWEL PROBLEMS  UNUSUAL RASH Items with * indicate a potential emergency and should be followed up as soon as possible. If you have an emergency after office hours please contact your primary care physician or go to the nearest emergency department.  Please call the clinic during office hours if you have any questions or concerns.   You may also contact the Patient Navigator at [phone] should you have any questions or need assistance in obtaining follow up care.      Resources For Cancer Patients and their Caregivers ? American Cancer Society: Can assist with transportation, wigs, general needs, runs Look Good Feel Better.        1-888-227-6333 ? Cancer Care: Provides financial assistance, online support groups, medication/co-pay assistance.  1-800-813-HOPE (4673) ? [facility] Assists [place] cancer patients and  their families through emotional , educational and financial support.  336-427-4357 ? [place] DSS Where to apply for food stamps, Medicaid and utility assistance. 336-342-1394 ? [facility]: Transportation to medical appointments. 336-347-2287 ? Social Security Administration: May apply for disability if have a Stage IV cancer. 336-342-7796 1-800-772-1213 ? [place] Aging, Disability and Transit Services: Assists with nutrition, care and transit needs. 336-349-2343         

## 2016-02-25 ENCOUNTER — Other Ambulatory Visit: Payer: Self-pay | Admitting: Internal Medicine

## 2016-02-25 DIAGNOSIS — B20 Human immunodeficiency virus [HIV] disease: Secondary | ICD-10-CM

## 2016-02-28 ENCOUNTER — Other Ambulatory Visit: Payer: Self-pay | Admitting: *Deleted

## 2016-02-28 DIAGNOSIS — B20 Human immunodeficiency virus [HIV] disease: Secondary | ICD-10-CM

## 2016-02-28 MED ORDER — EMTRICITAB-RILPIVIR-TENOFOV AF 200-25-25 MG PO TABS: 1.0000 | ORAL_TABLET | Freq: Every day | ORAL | 1 refills | Status: DC

## 2016-02-28 MED ORDER — DOLUTEGRAVIR SODIUM 50 MG PO TABS: 50.0000 mg | ORAL_TABLET | Freq: Every day | ORAL | 1 refills | Status: DC

## 2016-02-29 DIAGNOSIS — R0602 Shortness of breath: Secondary | ICD-10-CM | POA: Diagnosis not present

## 2016-02-29 DIAGNOSIS — I509 Heart failure, unspecified: Secondary | ICD-10-CM | POA: Diagnosis not present

## 2016-03-02 ENCOUNTER — Other Ambulatory Visit: Payer: Self-pay | Admitting: *Deleted

## 2016-03-02 ENCOUNTER — Other Ambulatory Visit: Payer: 59

## 2016-03-02 DIAGNOSIS — B20 Human immunodeficiency virus [HIV] disease: Secondary | ICD-10-CM

## 2016-03-03 ENCOUNTER — Ambulatory Visit (HOSPITAL_BASED_OUTPATIENT_CLINIC_OR_DEPARTMENT_OTHER)
Admission: RE | Admit: 2016-03-03 | Discharge: 2016-03-03 | Disposition: A | Discharge status: Home / Self Care | Payer: 59 | Source: Ambulatory Visit | Attending: Internal Medicine | Admitting: Internal Medicine

## 2016-03-03 ENCOUNTER — Other Ambulatory Visit: Payer: 59

## 2016-03-03 ENCOUNTER — Ambulatory Visit (HOSPITAL_COMMUNITY)
Admission: RE | Admit: 2016-03-03 | Discharge: 2016-03-03 | Disposition: A | Discharge status: Home / Self Care | Payer: 59 | Source: Ambulatory Visit | Attending: Internal Medicine | Admitting: Internal Medicine

## 2016-03-03 ENCOUNTER — Encounter: Payer: Self-pay | Admitting: Internal Medicine

## 2016-03-03 VITALS — BP 126/70 | HR 105 | Wt 256.5 lb

## 2016-03-03 DIAGNOSIS — E119 Type 2 diabetes mellitus without complications: Secondary | ICD-10-CM | POA: Diagnosis not present

## 2016-03-03 DIAGNOSIS — I5032 Chronic diastolic (congestive) heart failure: Secondary | ICD-10-CM

## 2016-03-03 DIAGNOSIS — I82402 Acute embolism and thrombosis of unspecified deep veins of left lower extremity: Secondary | ICD-10-CM | POA: Diagnosis not present

## 2016-03-03 DIAGNOSIS — Z79899 Other long term (current) drug therapy: Secondary | ICD-10-CM | POA: Insufficient documentation

## 2016-03-03 DIAGNOSIS — Z09 Encounter for follow-up examination after completed treatment for conditions other than malignant neoplasm: Secondary | ICD-10-CM | POA: Diagnosis present

## 2016-03-03 DIAGNOSIS — Z880 Allergy status to penicillin: Secondary | ICD-10-CM | POA: Insufficient documentation

## 2016-03-03 DIAGNOSIS — I4510 Unspecified right bundle-branch block: Secondary | ICD-10-CM | POA: Diagnosis not present

## 2016-03-03 DIAGNOSIS — C8110 Nodular sclerosis classical Hodgkin lymphoma, unspecified site: Secondary | ICD-10-CM | POA: Diagnosis not present

## 2016-03-03 DIAGNOSIS — I8390 Asymptomatic varicose veins of unspecified lower extremity: Secondary | ICD-10-CM | POA: Insufficient documentation

## 2016-03-03 DIAGNOSIS — C8198 Hodgkin lymphoma, unspecified, lymph nodes of multiple sites: Secondary | ICD-10-CM | POA: Diagnosis not present

## 2016-03-03 DIAGNOSIS — I110 Hypertensive heart disease with heart failure: Secondary | ICD-10-CM | POA: Insufficient documentation

## 2016-03-03 DIAGNOSIS — B20 Human immunodeficiency virus [HIV] disease: Secondary | ICD-10-CM | POA: Diagnosis not present

## 2016-03-03 DIAGNOSIS — Z7901 Long term (current) use of anticoagulants: Secondary | ICD-10-CM | POA: Insufficient documentation

## 2016-03-03 LAB — ECHOCARDIOGRAM COMPLETE
EERAT: 5.5
EWDT: 161 ms
FS: 36 % (ref 28–44)
IV/PV OW: 1.01
LA diam end sys: 35 mm
LA diam index: 1.52 cm/m2
LA vol A4C: 58.6 ml
LASIZE: 35 mm
LDCA: 2.54 cm2
LV E/e'average: 5.5
LV PW d: 9.47 mm — AB (ref 0.6–1.1)
LV TDI E'LATERAL: 11.9
LV TDI E'MEDIAL: 7.51
LV e' LATERAL: 11.9 cm/s
LVEEMED: 5.5
LVOT VTI: 22 cm
LVOT peak grad rest: 5 mmHg
LVOTD: 18 mm
LVOTPV: 116 cm/s
LVOTSV: 56 mL
MV Dec: 161
MV pk A vel: 85.4 m/s
MV pk E vel: 65.5 m/s
RV LATERAL S' VELOCITY: 13.7 cm/s
RV TAPSE: 20.4 mm

## 2016-03-03 LAB — T-HELPER CELL (CD4) - (RCID CLINIC ONLY)
CD4 % Helper T Cell: 45 % (ref 33–55)
CD4 T Cell Abs: 530 /uL (ref 400–2700)

## 2016-03-03 NOTE — Progress Notes (Signed)
CARDIO_ONCOLOGY CLINIC NOTE  Referring Physician: Dr. Whitney Muse   HPI:  Holly Stephens 61 y.o. female with morbid obesity, HIV, HTN, DM, Hodgkin's Disease with lymphatic and possibly pulmonary involvement (Stage III versus Stage IV disease ?pulmonary nodules x 2?).    She started ABVD on 09/13/2015 with curative intent with the discontinuation of Bleomycin on 10/11/2015.  PET scan after cycle #3 demonstrated Deauville 3 and therefore 4 more cycles of AVD were recommended.  Course complicated by hospitalization for diastolic congestive heart failure in 9/17.   Diuresed and improved. Echo with normal EF and grade 1 DD.  Here for f/u:  Feeling well. Completed 02/23/16.  Getting stronger. Gets tired but not as easily. Taking lasix 20 mg daily.  Edema controlled. Weight stable. No orthopnea or PND. Now on Xarelto for DVT. No bleeding.        ECHO 10/28/15 EF 60-65% Grade I DD PAP 36  Echo 01/21/16 EF 55-60% RV mildly dilated but normal function Echo 1/12.18 EF 60-65% RV normal Grade 1 DD Lat s 9.9cm/s GLS -17.5%  LVEF by MUGA 09/01/15 73%   ONCOLOGIC HISTORY:       [name], nodular sclerosis ([place])   07/23/2015 Procedure    Lext axilla lymph node biopsy     07/27/2015 Pathology Results    Classical [name], nodular sclerosis type.  Positive CD30, CD15, andPAX-5.  Negative CD45 and CD20.     08/31/2015 PET scan    Hypermetabolic lymphadenopathy. Nodal stations include: L supraclavicular nodes, L axillary nodes, mediastinal lymph nodes, periaortic retroperitoneal and periportal lymph nodes as well as L iliac and inguinal lymph nodes. Two concerning pulm nodules...     09/01/2015 Imaging    MUGA- Normal LEFT ventricular ejection fraction of 73%.  Normal LEFT ventricular wall motion.     09/01/2015 Procedure    PFTs-  Spirometry shows mild ventilatory defect without airflow obstruction and no bronchodilator improvement. Lung volumes are normal. Airway  resistance is normal. DLCO normal. Essentially normal lung function     09/08/2015 Procedure    Port placed by Dr. Rosana Hoes     09/13/2015 -  Chemotherapy    ABVD     09/27/2015 Imaging    CT angio of chest- no gross PE seen. Improving axillary, mediastinal and upper abdominal adenopathy. Improved and possibly resolved RUL nodule and with resolved or obscured LLL nodule. Small L pleural effusion and mild L lower lobe atelectasis.     10/11/2015 Treatment Plan Change    Bleomycin discontinued      11/01/2015 PET scan    Clear interval response to therapy with decrease in size and hypermetabolism of larger lymph nodes seen previously and some of the smaller hypermetabolic lymph node seen on the previous study have resolved completely in terms of abnormal enlargement and hypermetabolism. 2. Slight decrease in left lower lobe pulmonary nodule with interval resolution of the right upper lobe pulmonary nodule. 3. Stable appearance of mild heterogeneous FDG accumulation within the marrow space.     11/01/2015 Miscellaneous    Deauville 3- 4 more cycles of AVD recommended according to NCCN guidelines.           Past Medical History:  Diagnosis Date  . Acute deep vein thrombosis (DVT) of popliteal vein of left lower extremity ([place]) 02/09/2016  . Arthritis    osteoarthritis  . Diabetes mellitus without complication ([place])   . HIV infection ([place])   . [name], nodular sclerosis ([place]) 08/23/2015  . Human  immunodeficiency virus (HIV) disease ([place]) 02/23/2006   Qualifier: Diagnosis of  By: Megan Salon MD, John      Current Outpatient Prescriptions  Medication Sig Dispense Refill  . cholecalciferol (VITAMIN D) 1000 units tablet Take 2,000 Units by mouth daily.    . Diclofenac Sodium (PENNSAID) 2 % SOLN Place 2 application onto the skin 2 (two) times daily as needed (for pain).     Marland Kitchen dolutegravir (TIVICAY) 50 MG tablet Take 1 tablet (50 mg total) by mouth daily. 90  tablet 1  . emtricitabine-rilpivir-tenofovir AF (ODEFSEY) 200-25-25 MG TABS tablet Take 1 tablet by mouth daily. 90 tablet 1  . fenofibrate 160 MG tablet Take 160 mg by mouth daily.     . furosemide (LASIX) 40 MG tablet Take 0.5 tablets (20 mg total) by mouth daily. 30 tablet 0  . Garlic 123XX123 MG CAPS Take 1,000 mg by mouth daily.     Marland Kitchen JANUMET 50-500 MG per tablet Take 1 tablet by mouth 2 (two) times daily with a meal.     . lidocaine-prilocaine (EMLA) cream Apply 1 application topically as needed (prior to accessing port).    . metoprolol tartrate (LOPRESSOR) 25 MG tablet Take 1 tablet (25 mg total) by mouth daily. 30 tablet 0  . Multiple Vitamin (MULTIVITAMIN WITH MINERALS) TABS tablet Take 1 tablet by mouth daily.    . rivaroxaban (XARELTO) 20 MG TABS tablet Take 1 tablet (20 mg total) by mouth daily with supper. 30 tablet 2  . spironolactone (ALDACTONE) 25 MG tablet Take 1 tablet (25 mg total) by mouth daily. 90 tablet 3   No current facility-administered medications for this encounter.     Allergies  Allergen Reactions  . Augmentin [Amoxicillin-Pot Clavulanate] Hives and Other (See Comments)    Has patient had a PCN reaction causing immediate rash, facial/tongue/throat swelling, SOB or lightheadedness with hypotension: No Has patient had a PCN reaction causing severe rash involving mucus membranes or skin necrosis: No Has patient had a PCN reaction that required hospitalization No Has patient had a PCN reaction occurring within the last 10 years: No If all of the above answers are "NO", then may proceed with Cephalosporin use.  . Bleomycin Cough      Social History   Social History  . Marital status: Widowed    Spouse name: N/A  . Number of children: N/A  . Years of education: N/A   Occupational History  . Not on file.   Social History Main Topics  . Smoking status: Never Smoker  . Smokeless tobacco: Never Used  . Alcohol use No  . Drug use: No  . Sexual activity: No       Comment: declined condoms   Other Topics Concern  . Not on file   Social History Narrative  . No narrative on file      Family History  Problem Relation Age of Onset  . Hypertension Mother   . Cancer Mother 79    breast   . Hypertension Father   . Heart attack Father   . Pulmonary embolism Sister   . Parkinson's disease Brother     Vitals:   03/03/16 1358  BP: 126/70  Pulse: (!) 105  SpO2: 93%  Weight: 256 lb 8 oz (116.3 kg)   Wt Readings from Last 3 Encounters:  03/03/16 256 lb 8 oz (116.3 kg)  02/23/16 254 lb 9.6 oz (115.5 kg)  02/09/16 255 lb 9.6 oz (115.9 kg)     PHYSICAL EXAM: General:  Obese woman NAD HEENT: normal x for alopecia  Neck: supple. Hard to assess JVD. RIJ port-a-cath  Looks ok, Carotids 2+ bilat; no bruits. No lymphadenopathy or thryomegaly appreciated. Cor: PMI nondisplaced. Regular rate & rhythm. No rubs, gallops or murmurs. Lungs: clear Abdomen: markedly obese soft, nontender, nondistended. No bruits or masses. Good bowel sounds. Extremities: no cyanosis, clubbing, rash. Prominent varicose veins. 1+ edema on LLE none on R Neuro: alert & oriented x 3, cranial nerves grossly intact. moves all 4 extremities w/o difficulty. Affect pleasant.  ECG:  NSR with RBBB   ASSESSMENT & PLAN: 1. Chronic diastolic HF --Echo reviewed personally. EF normal. No evidence of adriamycin toxicity.  --Volume status much improved with low-dose lasix and spiro. Reinforced need for daily weights and reviewed use of sliding scale diuretics. --Recheck echo in 6 months after completion of Adriamycin 2. Hodgkin's lymphoma with LN involvement --Completes chemo 1/18. For PET scan 03/08/16 3. Morbid Obesity 4. DM2 5. LLE DVT  --Continue Xarelto 6. Varicose veins --Consider support stockings  Bensimhon, Daniel,MD 2:07 PM

## 2016-03-03 NOTE — Addendum Note (Signed)
Encounter addended by: Scarlette Calico, RN on: 03/03/2016  2:17 PM<BR>    Actions taken: Sign clinical note

## 2016-03-03 NOTE — Patient Instructions (Signed)
We will contact you in 6 months to schedule your next appointment and echocardiogram  

## 2016-03-03 NOTE — Progress Notes (Signed)
  Echocardiogram 2D Echocardiogram has been performed.  Johny Chess 03/03/2016, 2:02 PM

## 2016-03-03 NOTE — Telephone Encounter (Signed)
I replied back to the patient.  She is fine.  The metformin in the [place] interacts with Tivicay but she is on a good dose, so no need to worry. Thanks!

## 2016-03-06 LAB — HIV-1 RNA QUANT-NO REFLEX-BLD
HIV 1 RNA Quant: <20 copies/mL (ref <20)
HIV-1 RNA Quant, Log: <1.3 Log copies/mL (ref <1.30)

## 2016-03-08 ENCOUNTER — Ambulatory Visit (HOSPITAL_COMMUNITY): Payer: 59

## 2016-03-10 ENCOUNTER — Ambulatory Visit (HOSPITAL_COMMUNITY): Payer: 59 | Admitting: Oncology

## 2016-03-16 ENCOUNTER — Ambulatory Visit: Payer: 59 | Admitting: Internal Medicine

## 2016-03-18 ENCOUNTER — Encounter (HOSPITAL_COMMUNITY): Payer: Self-pay | Admitting: Hematology & Oncology

## 2016-03-22 ENCOUNTER — Ambulatory Visit (HOSPITAL_COMMUNITY)
Admission: RE | Admit: 2016-03-22 | Discharge: 2016-03-22 | Disposition: A | Discharge status: Home / Self Care | Payer: 59 | Source: Ambulatory Visit | Attending: Oncology | Admitting: Oncology

## 2016-03-22 ENCOUNTER — Other Ambulatory Visit (HOSPITAL_COMMUNITY): Payer: Self-pay | Admitting: Oncology

## 2016-03-22 ENCOUNTER — Encounter (HOSPITAL_COMMUNITY): Payer: Self-pay | Admitting: Lab

## 2016-03-22 DIAGNOSIS — C8112 Nodular sclerosis classical Hodgkin lymphoma, intrathoracic lymph nodes: Secondary | ICD-10-CM | POA: Diagnosis not present

## 2016-03-22 DIAGNOSIS — C8110 Nodular sclerosis classical Hodgkin lymphoma, unspecified site: Secondary | ICD-10-CM | POA: Diagnosis not present

## 2016-03-22 LAB — GLUCOSE, CAPILLARY: Glucose-Capillary: 129 mg/dL — ABNORMAL HIGH (ref 65–99)

## 2016-03-22 MED ORDER — FLUDEOXYGLUCOSE F - 18 (FDG) INJECTION
12.7300 | Freq: Once | INTRAVENOUS | Status: AC | PRN
  Administered 2016-03-22: 12.73 via INTRAVENOUS

## 2016-03-22 NOTE — Progress Notes (Unsigned)
Referral to [facility]. Records faxed on 1/31

## 2016-03-23 ENCOUNTER — Telehealth (HOSPITAL_COMMUNITY): Payer: Self-pay | Admitting: Lab

## 2016-03-23 ENCOUNTER — Encounter (HOSPITAL_COMMUNITY): Payer: Self-pay | Admitting: Lab

## 2016-03-23 NOTE — Progress Notes (Unsigned)
Referral sent to [facility].  Appt 2/8 @9 . Tom to tell patient about appt.

## 2016-03-27 ENCOUNTER — Encounter (HOSPITAL_COMMUNITY): Payer: 59 | Attending: Hematology & Oncology | Admitting: Oncology

## 2016-03-27 ENCOUNTER — Encounter (HOSPITAL_COMMUNITY): Payer: Self-pay | Admitting: Oncology

## 2016-03-27 ENCOUNTER — Encounter: Payer: Self-pay | Admitting: Radiation Oncology

## 2016-03-27 VITALS — BP 134/80 | HR 81 | Temp 97.5°F | Resp 18 | Wt 257.9 lb

## 2016-03-27 DIAGNOSIS — Z7901 Long term (current) use of anticoagulants: Secondary | ICD-10-CM

## 2016-03-27 DIAGNOSIS — I509 Heart failure, unspecified: Secondary | ICD-10-CM

## 2016-03-27 DIAGNOSIS — I82432 Acute embolism and thrombosis of left popliteal vein: Secondary | ICD-10-CM | POA: Diagnosis not present

## 2016-03-27 DIAGNOSIS — I429 Cardiomyopathy, unspecified: Secondary | ICD-10-CM

## 2016-03-27 DIAGNOSIS — C8118 Nodular sclerosis classical Hodgkin lymphoma, lymph nodes of multiple sites: Secondary | ICD-10-CM | POA: Diagnosis not present

## 2016-03-27 MED ORDER — HEPARIN SOD (PORK) LOCK FLUSH 100 UNIT/ML IV SOLN
500.0000 [IU] | Freq: Once | INTRAVENOUS | Status: AC
  Administered 2016-03-27: 500 [IU] via INTRAVENOUS

## 2016-03-27 MED ORDER — HEPARIN SOD (PORK) LOCK FLUSH 100 UNIT/ML IV SOLN
INTRAVENOUS | Status: AC
  Filled 2016-03-27: qty 5

## 2016-03-27 MED ORDER — FUROSEMIDE 40 MG PO TABS: 20.0000 mg | ORAL_TABLET | Freq: Every day | ORAL | 0 refills | Status: DC

## 2016-03-27 MED ORDER — RIVAROXABAN 20 MG PO TABS: 20.0000 mg | ORAL_TABLET | Freq: Every day | ORAL | 2 refills | Status: DC

## 2016-03-27 MED ORDER — SODIUM CHLORIDE 0.9% FLUSH
20.0000 mL | INTRAVENOUS | Status: DC | PRN
Start: 2016-03-27 — End: 2016-03-27
  Administered 2016-03-27: 20 mL via INTRAVENOUS
  Filled 2016-03-27: qty 20

## 2016-03-27 NOTE — Progress Notes (Signed)
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 [facility]) - Plan: potassium chloride SA (K-DUR,KLOR-CON) 20 MEQ tablet, furosemide (LASIX) 40 MG tablet, rivaroxaban (XARELTO) 20 MG TABS tablet, CBC with Differential, Comprehensive metabolic panel, Lactate dehydrogenase, sodium chloride flush (NS) 0.9 % injection 20 mL, heparin lock flush 100 unit/mL  Acute deep vein thrombosis (DVT) of popliteal vein of left lower extremity (HCC) - Plan: potassium chloride SA (K-DUR,KLOR-CON) 20 MEQ tablet, furosemide (LASIX) 40 MG tablet, rivaroxaban (XARELTO) 20 MG TABS tablet, CBC with Differential, Comprehensive metabolic panel, Lactate dehydrogenase, sodium chloride flush (NS) 0.9 % injection 20 mL, heparin lock flush 100 unit/mL  Congestive heart failure with cardiomyopathy ([place]) - Plan: potassium chloride SA (K-DUR,KLOR-CON) 20 MEQ tablet, furosemide (LASIX) 40 MG tablet, rivaroxaban (XARELTO) 20 MG TABS tablet, CBC with Differential, Comprehensive metabolic panel, Lactate dehydrogenase, sodium chloride flush (NS) 0.9 % injection 20 mL, heparin lock flush 100 unit/mL  CURRENT THERAPY: Review of PET imaging and further medical oncology recommendations.  Anticoagulation for with Xarelto  INTERVAL HISTORY: Holly Stephens 61 y.o. female returns for followup of Hodgkin's Lymphoma, nodular sclerosing-type, classical Hodgkin's Disease with lymphatic involvement in multiple lymph nodes superior and inferior to the diaphragm with PET imaging demonstrating findings concerning for pulmonary involvement (Stage III versus Stage IV disease ?pulmonary nodules x 2?). Started ABVD on 09/13/2015 with curative intent with the discontinuation of Bleomycin on 10/11/2015. PET scan after cycle #3 demonstrated Deauville 3 and therefore 4 more cycles of AVD is recommended. Course complicated by hospitalization for diastolic congestive heart failure  now being followed by Dr. Haroldine Laws in addition to HIV+ that is well controlled and followed by Dr. Megan Salon.  She completed systemic chemotherapy on 02/23/2016.  Post-treatment PET scan demonstrated mild residual left axillary lymph nodes (Deauville 2). AND Left lower extremity DVT on 02/09/2016, on Xarelto anticoagulation.    [name], nodular sclerosis ([place])   07/23/2015 Procedure    Lext axilla lymph node biopsy      07/27/2015 Pathology Results    Classical [name], nodular sclerosis type.  Positive CD30, CD15, andPAX-5.  Negative CD45 and CD20.      08/31/2015 PET scan    Hypermetabolic lymphadenopathy. Nodal stations include: L supraclavicular nodes, L axillary nodes, mediastinal lymph nodes, periaortic retroperitoneal and periportal lymph nodes as well as L iliac and inguinal lymph nodes. Two concerning pulm nodules...      09/01/2015 Imaging    MUGA- Normal LEFT ventricular ejection fraction of 73%.  Normal LEFT ventricular wall motion.      09/01/2015 Procedure    PFTs-  Spirometry shows mild ventilatory defect without airflow obstruction and no bronchodilator improvement. Lung volumes are normal. Airway resistance is normal. DLCO normal. Essentially normal lung function      09/08/2015 Procedure    Port placed by Dr. Rosana Hoes      09/13/2015 -  Chemotherapy    ABVD      09/27/2015 Imaging    CT angio of chest- no gross PE seen. Improving axillary, mediastinal and upper abdominal adenopathy. Improved and possibly resolved RUL nodule and with resolved or obscured LLL nodule. Small L pleural effusion and mild L lower lobe atelectasis.      10/11/2015 Treatment Plan Change    Bleomycin discontinued       11/01/2015 PET scan    Clear interval response to therapy with decrease in size and hypermetabolism  of larger lymph nodes seen previously and some of the smaller hypermetabolic lymph node seen on the previous study have resolved completely in terms of abnormal  enlargement and hypermetabolism. 2. Slight decrease in left lower lobe pulmonary nodule with interval resolution of the right upper lobe pulmonary nodule. 3. Stable appearance of mild heterogeneous FDG accumulation within the marrow space.      11/01/2015 Miscellaneous    Deauville 3- 4 more cycles of AVD recommended according to NCCN guidelines.      02/09/2016 Imaging    [place] left leg- Positive for acute DVT in left calf, popliteal and femoral veins.      03/22/2016 PET scan    1. Mild residual metabolic activity within LEFT axillary lymph nodes ( Deauville 2 ). 2. No additional hypermetabolic adenopathy identified. 3. Normal spleen and marrow activity.       She notes that her energy is returning. She is recovering from chemotherapy. She denies any hematologic or oncologic complaints today.  She is here to learn the results of PET scan and medical oncology recommendations were forward.  Her breathing is at baseline. She denies any shortness of breath or chest pains. She appreciates fatigue that comes and goes. It is not constant. Overall, it is improving.  Review of Systems  Constitutional: Positive for malaise/fatigue. Negative for chills, fever and weight loss.  HENT: Negative.   Eyes: Negative.   Respiratory: Negative.  Negative for cough, sputum production and shortness of breath.   Cardiovascular: Negative.  Negative for chest pain, palpitations and orthopnea.  Gastrointestinal: Negative.  Negative for constipation, diarrhea, nausea and vomiting.  Genitourinary: Negative.   Musculoskeletal: Negative.   Skin: Negative.  Negative for rash.  Neurological: Negative.   Endo/Heme/Allergies: Negative.   Psychiatric/Behavioral: Negative.     Past Medical History:  Diagnosis Date  . Acute deep vein thrombosis (DVT) of popliteal vein of left lower extremity ([place]) 02/09/2016  . Arthritis    osteoarthritis  . Diabetes mellitus without complication ([place])   . HIV infection  ([place])   . [name], nodular sclerosis ([place]) 08/23/2015  . Human immunodeficiency virus (HIV) disease ([place]) 02/23/2006   Qualifier: Diagnosis of  By: Megan Salon MD, John      Past Surgical History:  Procedure Laterality Date  . AXILLARY LYMPH NODE BIOPSY Left 07/23/2015   Procedure: EXCISIONAL BIOPSY LEFT AXILLARY LYMPH NODE;  Surgeon: [name], MD;  Location: [facility];  Service: General;  Laterality: Left;  . PORTACATH PLACEMENT Right 09/08/2015   Procedure: INSERTION OF CENTRAL VENOUS CATHETER WITH PORT FOR CHEMOTHERAPY;  Surgeon: [name], MD;  Location: [facility];  Service: General;  Laterality: Right;  . SHOULDER SURGERY Right    removal of bone spur    Family History  Problem Relation Age of Onset  . Hypertension Mother   . Cancer Mother 44    breast   . Hypertension Father   . Heart attack Father   . Pulmonary embolism Sister   . Parkinson's disease Brother     Social History   Social History  . Marital status: Widowed    Spouse name: N/A  . Number of children: N/A  . Years of education: N/A   Social History Main Topics  . Smoking status: Never Smoker  . Smokeless tobacco: Never Used  . Alcohol use No  . Drug use: No  . Sexual activity: No     Comment: declined condoms   Other Topics Concern  . None  Social History Narrative  . None     PHYSICAL EXAMINATION  ECOG PERFORMANCE STATUS: 1 - Symptomatic but completely ambulatory  Vitals:   03/27/16 0938  BP: 134/80  Pulse: 81  Resp: 18  Temp: 97.5 F (36.4 C)    GENERAL:alert, no distress, smiling and truncal obesity, unaccompanied. SKIN: skin color, texture, turgor are normal, no rashes or significant lesions HEAD: Normocephalic, No masses, lesions, tenderness or abnormalities EYES: normal, EOMI, Conjunctiva are pink and non-injected EARS: External ears normal OROPHARYNX:lips, buccal mucosa, and tongue normal and mucous membranes are moist  NECK: supple, no adenopathy, trachea  midline LYMPH:  Left axillary lymph node, 2 cm in size, deep. BREAST:not examined LUNGS: clear to auscultation  HEART: regular rate & rhythm ABDOMEN:abdomen soft, obese and normal bowel sounds BACK: Back symmetric, no curvature. EXTREMITIES:less then 2 second capillary refill, no joint deformities, effusion, or inflammation, no skin discoloration, no cyanosis  NEURO: alert & oriented x 3 with fluent speech, no focal motor/sensory deficits, gait normal   LABORATORY DATA: CBC    Component Value Date/Time   WBC 10.5 02/23/2016 0924   RBC 4.00 02/23/2016 0924   HGB 12.2 02/23/2016 0924   HCT 38.5 02/23/2016 0924   PLT 270 02/23/2016 0924   MCV 96.3 02/23/2016 0924   MCH 30.5 02/23/2016 0924   MCHC 31.7 02/23/2016 0924   RDW 16.8 (H) 02/23/2016 0924   LYMPHSABS 1.5 02/23/2016 0924   MONOABS 0.6 02/23/2016 0924   EOSABS 0.1 02/23/2016 0924   BASOSABS 0.1 02/23/2016 0924      Chemistry      Component Value Date/Time   NA 140 02/23/2016 0924   K 3.4 (L) 02/23/2016 0924   CL 104 02/23/2016 0924   CO2 29 02/23/2016 0924   BUN 11 02/23/2016 0924   CREATININE 0.65 02/23/2016 0924   CREATININE 0.57 12/02/2015 1008      Component Value Date/Time   CALCIUM 9.4 02/23/2016 0924   ALKPHOS 49 02/23/2016 0924   AST 43 (H) 02/23/2016 0924   ALT 41 02/23/2016 0924   BILITOT 0.3 02/23/2016 0924        PENDING LABS:   RADIOGRAPHIC STUDIES:  Nm Pet Image Restag (ps) Skull Base To Thigh  Result Date: 03/22/2016 CLINICAL DATA:  Subsequent treatment strategy for nodular sclerosing Hodgkin's lymphoma. Patient status post chemotherapy. EXAM: NUCLEAR MEDICINE PET SKULL BASE TO THIGH TECHNIQUE: 12.7 mCi F-18 FDG was injected intravenously. Full-ring PET imaging was performed from the skull base to thigh after the radiotracer. CT data was obtained and used for attenuation correction and anatomic localization. FASTING BLOOD GLUCOSE:  Value: 129 mg/dl COMPARISON:  PET-CT 11/01/2015,  08/31/2015 FINDINGS: NECK No hypermetabolic lymph nodes in the neck. CHEST Hypermetabolic axial lymph nodes are similar to comparison exam with SUV max equal 3.7 compared to 3 7. Activity is similar to mediastinal blood pool and less than the liver metabolic activity. Lymph nodes remain mildly enlarged at 1.7 cm short axis. Resolution of the hypermetabolic airspace disease in the RIGHT upper lobe. No hypermetabolic mediastinal lymph nodes identified. ABDOMEN/PELVIS Normal metabolic activity in the spleen. No hypermetabolic periaortic retroperitoneal lymph nodes. No hypermetabolic iliac lymph nodes. Again demonstrated large ventral hernia.  Hernia contains only fat. SKELETON No focal hypermetabolic activity to suggest skeletal metastasis. IMPRESSION: 1. Mild residual metabolic activity within LEFT axillary lymph nodes ( Deauville 2 ). 2. No additional hypermetabolic adenopathy identified. 3. Normal spleen and marrow activity. Electronically Signed   By: Helane Gunther.D.  On: 03/22/2016 10:20     PATHOLOGY:    ASSESSMENT AND PLAN:  [name], nodular sclerosis (HCC) Hodgkin's Lymphoma, nodular sclerosing-type, classical Hodgkin's Disease with lymphatic involvement in multiple lymph nodes superior and inferior to the diaphragm with PET imaging demonstrating findings concerning for pulmonary involvement (Stage III versus Stage IV disease ?pulmonary nodules x 2?).  Started ABVD on 09/13/2015 with curative intent with the discontinuation of Bleomycin on 10/11/2015.  PET scan after cycle #3 demonstrated Deauville 3 and therefore 4 more cycles of AVD is recommended.  Course complicated by hospitalization for diastolic congestive heart failure now being followed by Dr. Haroldine Laws in addition to HIV+ that is well controlled and followed by Dr. Megan Salon.  She completed systemic chemotherapy on 02/23/2016.  Post-treatment PET scan demonstrated mild residual left axillary lymph nodes (Deauville 2).   Oncology  history updated.  She is here today to review her PET imaging.  PET PET imaging was delayed due to inclement weather.  I personally reviewed and went over radiographic studies with the patient.  The results are noted within this dictation.  PET scan demonstrates residual left axillary adenopathy.  She saw Dr. Haroldine Laws on 03/03/2016 for follow-up: 1. Chronic diastolic HF --Echo reviewed personally. EF normal. No evidence of adriamycin toxicity.  --Volume status much improved with low-dose lasix and spiro. Reinforced need for daily weights and reviewed use of sliding scale diuretics. --Recheck echo in 6 months after completion of Adriamycin 2. Hodgkin's lymphoma with LN involvement --Completes chemo 1/18. For PET scan 03/08/16 3. Morbid Obesity 4. DM2 5. LLE DVT  --Continue Xarelto 6. Varicose veins --Consider support stockings Today is her last treatment.   Given residual disease, she has been referred to XRT for treatment of areas of disease.  Since she is back to work in [employer], she would like to have XRT in [place].  We will make the referral accordingly.  I have refilled her Lasix and Xarelto. Both medications are escribed to [facility].  Return in 6 weeks for follow-up with labs.   ORDERS PLACED FOR THIS ENCOUNTER: Orders Placed This Encounter  Procedures  . CBC with Differential  . Comprehensive metabolic panel  . Lactate dehydrogenase    MEDICATIONS PRESCRIBED THIS ENCOUNTER: Meds ordered this encounter  Medications  . potassium chloride SA (K-DUR,KLOR-CON) 20 MEQ tablet  . furosemide (LASIX) 40 MG tablet    Sig: Take 0.5 tablets (20 mg total) by mouth daily.    Dispense:  45 tablet    Refill:  0    Written on 03/27/2016    Order Specific Question:   Supervising Provider    Answer:   Brunetta Genera [[id number]]  . rivaroxaban (XARELTO) 20 MG TABS tablet    Sig: Take 1 tablet (20 mg total) by mouth daily with supper.    Dispense:  30 tablet    Refill:  2    Written  on 03/27/2016    Order Specific Question:   Supervising Provider    Answer:   Brunetta Genera [[id number]]  . sodium chloride flush (NS) 0.9 % injection 20 mL  . heparin lock flush 100 unit/mL    THERAPY PLAN:  Refer to XRT for radiation to residual disease.  NCCN guidelines for surveillance of Hodgkin's Lymphoma (Age >/= 40) (1.2017):  A. Complete remission should be documented including reversion of PET to "negative" within 3 months following completion of therapy.  B. It is recommended that the patient be provided with a treatment summary at the  completion of his/her therapy, including details of radiation therapy, organs at risk, and cumulative anthracycline dosage given.  C. Follow-up with an oncologist is recommended, especially during the first 5 years after treatment to detect recurrence, and then annually due to the risk of late complications including second cancers and cardiovascular disease. Late relapse or transformation to large cell lymphoma may occur in [place].  D. Frequency and types of tests may vary depending on clinical circumstances: age and stage at diagnosis, social habits, treatment modality, etc. There are few data to support specific recommendations; these represent the range of practice at [employer].   1. Follow-up after completion of treatment up to 5 years:   A. Interim H and P: Every 3-6 months for 1-2 years, then every 6-12 months until year 3, then annually.   B. Annual influenza vaccine.   C. Laboratory studies:    1. CBC, platelets, ESR (if elevated at time of initial diagnosis), chemistry profile as clinically indicated.    2. Thyroid stimulating hormone (TSH) at least annually if radiation therapy to neck.   D. Acceptable to obtain a neck/chest/abdomen/pelvis CT scan with contrast, at 6, 12, and 24 month following completion of therapy, or as clinically indicated. PET/CT only if last PET was Deauville 4-5, to confirm complete response.   E.  Counseling: Reproduction, health habits, psychosocial, cardiovascular, breast self-exam, skin cancer risk, end of treatment discussion.   F. Surveillance PET should not be done routinely due to risk of false positives. Management decision should not be based on PET scan alone; clinical or pathologic correlation is needed.   2. Follow-up and monitoring after 5 years:   A. Interim H&P: Annually    1. Annual blood pressure, aggressive management of cardiovascular risk factors.    2. Pneumococcal, meningococcal, and H-flu revaccination after 5-7 years, if patient treated with splenic radiation therapy or previous splenectomy (according to CBC recommendations).    3. Annual influenza vaccine.   B. Cardiovascular symptoms may emerge at a young age.    1. Consider stress test/echocardiogram at 10 year intervals after treatment is completed.    2. Consider carotid ultrasound at 10 year intervals if neck irradiation.   C. Laboratory studies:    1. CBC, platelets, chemistry profile annually    2. TSH at least annually if radiation therapy to neck    3. Biannual lipids    4. Annual fasting glucose   D. Annual breast screening: Initiate 8-10 years post therapy, or at age 30, whichever comes first, if chest or axillary radiation. The NCCN [name] Guidelines Panel recommends breast MRI in addition to mammography for women who've received irradiation to the chest between ages 58-30 years, which is consistent with the [place] (ACS) Guidelines. Consider referral to a breast specialist.   E. Perform other routine surveillance tests for cervical, colorectal, endometrial, lung, and prostate cancer as per the ACS Cancer Screening Guidelines.   F. Counseling: Reproduction, health habits, psychosocial, cardiovascular, breast self-exam, and skin cancer risk.   G. Treatment summary and consideration of transfer to primary care provider.   H. Consider a referral to a survivorship clinic.       All questions were answered. The patient knows to call the clinic with any problems, questions or concerns. We can certainly see the patient much sooner if necessary.  Patient and plan discussed with Dr. Twana First and she is in agreement with the aforementioned.   This note is electronically signed by: Doy Mince  03/27/2016 5:09 PM

## 2016-03-27 NOTE — Patient Instructions (Signed)
[place] at [facility] Discharge Instructions  RECOMMENDATIONS MADE BY THE CONSULTANT AND ANY TEST RESULTS WILL BE SENT TO YOUR REFERRING PHYSICIAN.  You were seen today by [name], PA-C We will flush your port today Follow up in 6 weeks with port flush, lab work and follow up   Thank you for choosing [place] at [facility] to provide your oncology and hematology care.  To afford each patient quality time with our provider, please arrive at least 15 minutes before your scheduled appointment time.    If you have a lab appointment with the [facility] please come in thru the  Main Entrance and check in at the main information desk  You need to re-schedule your appointment should you arrive 10 or more minutes late.  We strive to give you quality time with our providers, and arriving late affects you and other patients whose appointments are after yours.  Also, if you no show three or more times for appointments you may be dismissed from the clinic at the providers discretion.     Again, thank you for choosing [facility].  Our hope is that these requests will decrease the amount of time that you wait before being seen by our physicians.       _____________________________________________________________  Should you have questions after your visit to [facility], please contact our office at [phone] between the hours of 8:30 a.m. and 4:30 p.m.  Voicemails left after 4:30 p.m. will not be returned until the following business day.  For prescription refill requests, have your pharmacy contact our office.       Resources For Cancer Patients and their Caregivers ? American Cancer Society: Can assist with transportation, wigs, general needs, runs Look Good Feel Better.        256-052-0007 ? Cancer Care: Provides financial assistance, online support groups, medication/co-pay assistance.  1-800-813-HOPE  406-745-2542) ? [place] Assists [place] cancer patients and their families through emotional , educational and financial support.  236-151-5473 ? [place] DSS Where to apply for food stamps, Medicaid and utility assistance. 616-626-7196 ? [facility]: Transportation to medical appointments. 331-825-4124 ? Social Security Administration: May apply for disability if have a Stage IV cancer. (203) 245-8052 (878) 162-1912 ? [employer], Disability and Transit Services: Assists with nutrition, care and transit needs. [place] Support Programs: @10RELATIVEDAYS @ > Cancer Support Group  2nd Tuesday of the month 1pm-2pm, Journey Room  > Creative Journey  3rd Tuesday of the month 1130am-1pm, Journey Room  > Look Good Feel Better  1st Wednesday of the month 10am-12 noon, Journey Room (Call [facility] to register [phone])

## 2016-03-27 NOTE — Progress Notes (Signed)
Holly Stephens presented for Portacath access and flush. Proper placement of portacath confirmed by CXR. Portacath located right chest wall accessed with  H 20 needle. Good blood return present.   Portacath flushed with 75ml NS and 500U/45ml Heparin and needle removed intact. Procedure without incident. Patient tolerated procedure well.

## 2016-03-27 NOTE — Assessment & Plan Note (Addendum)
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  Started ABVD on 09/13/2015 with curative intent with the discontinuation of Bleomycin on 10/11/2015.  PET scan after cycle #3 demonstrated Deauville 3 and therefore 4 more cycles of AVD is recommended.  Course complicated by hospitalization for diastolic congestive heart failure now being followed by Dr. Haroldine Laws in addition to HIV+ that is well controlled and followed by Dr. Megan Salon.  She completed systemic chemotherapy on 02/23/2016.  Post-treatment PET scan demonstrated mild residual left axillary lymph nodes (Deauville 2).   Oncology history updated.  She is here today to review her PET imaging.  PET PET imaging was delayed due to inclement weather.  I personally reviewed and went over radiographic studies with the patient.  The results are noted within this dictation.  PET scan demonstrates residual left axillary adenopathy.  She saw Dr. Haroldine Laws on 03/03/2016 for follow-up: 1. Chronic diastolic HF --Echo reviewed personally. EF normal. No evidence of adriamycin toxicity.  --Volume status much improved with low-dose lasix and spiro. Reinforced need for daily weights and reviewed use of sliding scale diuretics. --Recheck echo in 6 months after completion of Adriamycin 2. Hodgkin's lymphoma with LN involvement --Completes chemo 1/18. For PET scan 03/08/16 3. Morbid Obesity 4. DM2 5. LLE DVT  --Continue Xarelto 6. Varicose veins --Consider support stockings Today is her last treatment.   Given residual disease, she has been referred to XRT for treatment of areas of disease.  Since she is back to work in [employer], she would like to have XRT in [place] at [place].  We will make the referral accordingly.  I have  refilled her Lasix and Xarelto. Both medications are escribed to [facility].  Return in 6 weeks for follow-up with labs.

## 2016-03-29 NOTE — Progress Notes (Signed)
Lymphoma Location(s) / Histology: Hodgkins Lymphoma 07/06/15 Diagnosis Lymph node, needle/core biopsy, left Axillary - ATYPICAL LYMPHOID PROLIFERATION SUSPICIOUS FOR LYMPHOMA.  Interpretation Tissue-Flow Cytometry - NO MONOCLONAL B-CELL POPULATION OR ABNORMAL T-CELL PHENOTYPE IDENTIFIED.  07/23/15 Diagnosis Lymph node for lymphoma, left axillary - CLASSICAL [name], NODULAR SCLEROSIS TYPE.  Holly Stephens presented to her Primary MD with symptoms of: Left Axillary mass.  Patient first noted Left axillary mass in  March 2017, at which time she reported it to her PMD, and workup was initiated including mammogram and FNA.   Past/Anticipated interventions by medical oncology, if any:  Robynn Pane PA documents on 03/27/16: Started ABVD on 09/13/2015 with curative intent with the discontinuation of Bleomycin on 10/11/2015.  PET scan after cycle #3 demonstrated Deauville 3 and therefore 4 more cycles of AVD is recommended.  Course complicated by hospitalization for diastolic congestive heart failure.   She completed systemic chemotherapy on 02/23/2016.  Post-treatment PET scan demonstrated mild residual left axillary lymph nodes (Deauville 2).   Weight changes, if any, over the past 6 months: loss 50 lbs since August 14, 2015,  Stable now 2-3 lbs either way  Recurrent fevers, or drenching night sweats, if any: every once on a while night sweats  SAFETY ISSUES: no  Prior radiation?  NO  Pacemaker/ICD?  NO  Possible current pregnancy?  NO  Is the patient on methotrexate?   Current Complaints / other details:  [name], HTN, DM.  Widowed, Never smoker, Port a cath. Right  Menses age 83, G)PO,  Mother breast cancer, maternal aunt and uncles cancer BP 122/70 (BP Location: Right Arm, Patient Position: Sitting, Cuff Size: Normal)   Pulse 70   Temp 98.3 F (36.8 C) (Oral)   Resp 16   Ht 5\' 1"  (1.549 m)   Wt 259 lb 9.6 oz (117.8 kg)   BMI 49.05 kg/m   Wt Readings from Last 3  Encounters:  04/04/16 259 lb 9.6 oz (117.8 kg)  03/27/16 257 lb 14.4 oz (117 kg)  03/03/16 256 lb 8 oz (116.3 kg)

## 2016-04-04 ENCOUNTER — Telehealth: Payer: Self-pay

## 2016-04-04 ENCOUNTER — Ambulatory Visit
Admission: RE | Admit: 2016-04-04 | Discharge: 2016-04-04 | Disposition: A | Discharge status: Home / Self Care | Payer: 59 | Source: Ambulatory Visit | Attending: Radiation Oncology | Admitting: Radiation Oncology

## 2016-04-04 ENCOUNTER — Encounter: Payer: Self-pay | Admitting: Radiation Oncology

## 2016-04-04 DIAGNOSIS — Z7901 Long term (current) use of anticoagulants: Secondary | ICD-10-CM | POA: Insufficient documentation

## 2016-04-04 DIAGNOSIS — Z7984 Long term (current) use of oral hypoglycemic drugs: Secondary | ICD-10-CM | POA: Diagnosis not present

## 2016-04-04 DIAGNOSIS — B20 Human immunodeficiency virus [HIV] disease: Secondary | ICD-10-CM | POA: Diagnosis not present

## 2016-04-04 DIAGNOSIS — Z86718 Personal history of other venous thrombosis and embolism: Secondary | ICD-10-CM | POA: Diagnosis not present

## 2016-04-04 DIAGNOSIS — C8114 Nodular sclerosis classical Hodgkin lymphoma, lymph nodes of axilla and upper limb: Secondary | ICD-10-CM

## 2016-04-04 DIAGNOSIS — M1990 Unspecified osteoarthritis, unspecified site: Secondary | ICD-10-CM | POA: Insufficient documentation

## 2016-04-04 DIAGNOSIS — Z8249 Family history of ischemic heart disease and other diseases of the circulatory system: Secondary | ICD-10-CM | POA: Diagnosis not present

## 2016-04-04 DIAGNOSIS — E119 Type 2 diabetes mellitus without complications: Secondary | ICD-10-CM | POA: Diagnosis not present

## 2016-04-04 DIAGNOSIS — Z9221 Personal history of antineoplastic chemotherapy: Secondary | ICD-10-CM | POA: Diagnosis not present

## 2016-04-04 DIAGNOSIS — Z881 Allergy status to other antibiotic agents status: Secondary | ICD-10-CM | POA: Insufficient documentation

## 2016-04-04 DIAGNOSIS — Z510 Encounter for antineoplastic radiation therapy: Secondary | ICD-10-CM | POA: Diagnosis not present

## 2016-04-04 DIAGNOSIS — Z79899 Other long term (current) drug therapy: Secondary | ICD-10-CM | POA: Insufficient documentation

## 2016-04-04 NOTE — Telephone Encounter (Signed)
I called and spoke to Holly Stephens. I informed her that she will proceed with radiation. She will not need any chemotherapy at this time. I informed her that someone from [facility] will call her and schedule her simulation appointment. She voiced her understanding and appreciation and knows to call if she has any further questions.

## 2016-04-04 NOTE — Progress Notes (Signed)
Please see the Nurse Progress Note in the MD Initial Consult Encounter for this patient. 

## 2016-04-04 NOTE — Progress Notes (Signed)
Radiation Oncology         [phone] ________________________________  Initial outpatient Consultation  Name: LENDIA DAVIDOVICH MRN: [id number]  Date: 04/04/2016  DOB: 02-Dec-1955  [id number] [place] Crane, MD  Baird Cancer, PA-C   REFERRING PHYSICIAN: Baird Cancer, PA-C  DIAGNOSIS:    ICD-9-CM ICD-10-CM   1. 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HCC) 201.54 C81.14    Stage III v Stage IV classical hodgkin lymphoma, nodular sclerosis type  CHIEF COMPLAINT: Here to discuss management of lymphoma, hodgkins  HISTORY OF PRESENT ILLNESS::Menucha JALEIGH PROVAN is a 61 y.o. female who presented with arthritic symptoms, severe pain in her distal extremities.  She also noted a left axillary mass in March 2017.  PMH notable for HIV+ since 1993, followed by Michel Bickers.  She was ultimately found to have classical hodgkin lymphoma, nodular sclerosis type, by biopsy of left axilla on 07-23-15. Staging PET reviewed by me revealed numerous masses on both sides of the diaphragm, and two suspicious pulmonary nodules - felt to be STAGE III v IV.  She started ABVD in July and received 6 cycles though bleomycin started to be held as of  August due to complications.  She did experience CHF during her course.  Last chemo was on 02-23-16.  Post treatment PET, also reviewed by me, shows Deaville 2 activity on the left axillary nodes.  I spoke to [name] PA about the patient and based on my request he kindly discussed the patient with medical oncologists at [facility] to make sure that there is not a role for additional systemic therapy.  They do not recommend any further systemic therapy at this time.  It is unclear if she had B symptoms at time of diagnosis though she has lost 50lbs since starting chemotherapy. Occasional night sweats reported recently.  Pt reports rare HA, allergy symptoms - seasonal, LE L>R related to DVT that is getting managed by med/onc, facial rash r/t chemotherapy.  Arthritic  pain has resolved.  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  PREVIOUS RADIATION THERAPY: No  PAST MEDICAL HISTORY:  has a past medical history of Acute deep vein thrombosis (DVT) of popliteal vein of left lower extremity ([place]) (02/09/2016); Arthritis; Diabetes mellitus without complication ([place]); HIV infection ([place]); [name], nodular sclerosis ([place]) (08/23/2015); and Human immunodeficiency virus (HIV) disease ([place]) (02/23/2006).    PAST SURGICAL HISTORY: Past Surgical History:  Procedure Laterality Date  . AXILLARY LYMPH NODE BIOPSY Left 07/23/2015   Procedure: EXCISIONAL BIOPSY LEFT AXILLARY LYMPH NODE;  Surgeon: [name], MD;  Location: [facility];  Service: General;  Laterality: Left;  . PORTACATH PLACEMENT Right 09/08/2015   Procedure: INSERTION OF CENTRAL VENOUS CATHETER WITH PORT FOR CHEMOTHERAPY;  Surgeon: [name], MD;  Location: [facility];  Service: General;  Laterality: Right;  . SHOULDER SURGERY Right    removal of bone spur    FAMILY HISTORY: family history includes Cancer (age of onset: 19) in her mother; Heart attack in her father; Hypertension in her father and mother; Parkinson's disease in her brother; Pulmonary embolism in her sister.  SOCIAL HISTORY:  reports that she has never smoked. She has never used smokeless tobacco. She reports that she does not drink alcohol or use drugs.  ALLERGIES: Augmentin [amoxicillin-pot clavulanate] and Bleomycin  MEDICATIONS:  Current Outpatient Prescriptions  Medication Sig Dispense Refill  . cholecalciferol (VITAMIN D) 1000 units tablet Take 2,000 Units by mouth daily.    . dolutegravir (TIVICAY)  50 MG tablet Take 1 tablet (50 mg total) by mouth daily. 90 tablet 1  . emtricitabine-rilpivir-tenofovir AF (ODEFSEY) 200-25-25 MG TABS tablet Take 1 tablet by mouth daily. 90 tablet 1  . fenofibrate 160 MG tablet Take 160 mg by mouth daily.     . furosemide (LASIX) 40 MG tablet Take 0.5 tablets (20 mg total) by mouth daily. 45  tablet 0  . Garlic 123XX123 MG CAPS Take 1,000 mg by mouth daily.     Marland Kitchen JANUMET 50-500 MG per tablet Take 1 tablet by mouth 2 (two) times daily with a meal.     . lidocaine-prilocaine (EMLA) cream Apply 1 application topically as needed (prior to accessing port).    . metoprolol tartrate (LOPRESSOR) 25 MG tablet Take 1 tablet (25 mg total) by mouth daily. 30 tablet 0  . Multiple Vitamin (MULTIVITAMIN WITH MINERALS) TABS tablet Take 1 tablet by mouth daily.    . rivaroxaban (XARELTO) 20 MG TABS tablet Take 1 tablet (20 mg total) by mouth daily with supper. 30 tablet 2  . spironolactone (ALDACTONE) 25 MG tablet Take 1 tablet (25 mg total) by mouth daily. 90 tablet 3  . potassium chloride SA (K-DUR,KLOR-CON) 20 MEQ tablet      No current facility-administered medications for this encounter.     REVIEW OF SYSTEMS:  A 10+ POINT REVIEW OF SYSTEMS WAS OBTAINED including neurology, immunology, dermatology, cardiac, respiratory, lymph, extremities, GI,  Musculoskeletal, constitutional, breasts, HEENT.  All pertinent positives are noted in the HPI.  All others are negative.   PHYSICAL EXAM:  height is 5\' 1"  (1.549 m) and weight is 259 lb 9.6 oz (117.8 kg). Her oral temperature is 98.3 F (36.8 C). Her blood pressure is 122/70 and her pulse is 70. Her respiration is 16.   General: Alert and oriented, in no acute distress HEENT: Head is normocephalic. Extraocular movements are intact. Oropharynx is clear. Neck: Neck is supple, no palpable cervical or supraclavicular lymphadenopathy. Heart: Regular in rate and rhythm with no murmurs, rubs, or gallops. Chest: Clear to auscultation bilaterally, with no rhonchi, wheezes, or rales. Abdomen: Soft, nontender, nondistended, with no rigidity or guarding. Extremities: LE edema L>R. Lymphatics: see Neck Exam and breast exam Skin: No concerning lesions. Mild Erythematous rash over cheeks/nose Musculoskeletal: symmetric strength and muscle tone throughout. Neurologic:  Cranial nerves II through XII are grossly intact. No obvious focalities. Speech is fluent. Coordination is intact. Psychiatric: Judgment and insight are intact. Affect is appropriate. Breasts: no masses in breast tissue; 3cm left axillary mass palpated   ECOG = 1  0 - Asymptomatic (Fully active, able to carry on all predisease activities without restriction)  1 - Symptomatic but completely ambulatory (Restricted in physically strenuous activity but ambulatory and able to carry out work of a light or sedentary nature. For example, light housework, office work)  2 - Symptomatic, <50% in bed during the day (Ambulatory and capable of all self care but unable to carry out any work activities. Up and about more than 50% of waking hours)  3 - Symptomatic, >50% in bed, but not bedbound (Capable of only limited self-care, confined to bed or chair 50% or more of waking hours)  4 - Bedbound (Completely disabled. Cannot carry on any self-care. Totally confined to bed or chair)  5 - Death   Eustace Pen MM, Creech RH, Tormey DC, et al. [phone]). "Toxicity and response criteria of the [facility]". [place] Oncol. 5 (6): 649-55   LABORATORY  DATA:  Lab Results  Component Value Date   WBC 10.5 02/23/2016   HGB 12.2 02/23/2016   HCT 38.5 02/23/2016   MCV 96.3 02/23/2016   PLT 270 02/23/2016   CMP     Component Value Date/Time   NA 140 02/23/2016 0924   K 3.4 (L) 02/23/2016 0924   CL 104 02/23/2016 0924   CO2 29 02/23/2016 0924   GLUCOSE 150 (H) 02/23/2016 0924   BUN 11 02/23/2016 0924   CREATININE 0.65 02/23/2016 0924   CREATININE 0.57 12/02/2015 1008   CALCIUM 9.4 02/23/2016 0924   PROT 6.6 02/23/2016 0924   ALBUMIN 3.7 02/23/2016 0924   AST 43 (H) 02/23/2016 0924   ALT 41 02/23/2016 0924   ALKPHOS 49 02/23/2016 0924   BILITOT 0.3 02/23/2016 0924   GFRNONAA >60 02/23/2016 0924   GFRNONAA >60 10/31/2010 0849   GFRAA >60 02/23/2016 0924   GFRAA >60 10/31/2010 0849           RADIOGRAPHY: Nm Pet Image Restag (ps) Skull Base To Thigh  Result Date: 03/22/2016 CLINICAL DATA:  Subsequent treatment strategy for nodular sclerosing Hodgkin's lymphoma. Patient status post chemotherapy. EXAM: NUCLEAR MEDICINE PET SKULL BASE TO THIGH TECHNIQUE: 12.7 mCi F-18 FDG was injected intravenously. Full-ring PET imaging was performed from the skull base to thigh after the radiotracer. CT data was obtained and used for attenuation correction and anatomic localization. FASTING BLOOD GLUCOSE:  Value: 129 mg/dl COMPARISON:  PET-CT 11/01/2015, 08/31/2015 FINDINGS: NECK No hypermetabolic lymph nodes in the neck. CHEST Hypermetabolic axial lymph nodes are similar to comparison exam with SUV max equal 3.7 compared to 3 7. Activity is similar to mediastinal blood pool and less than the liver metabolic activity. Lymph nodes remain mildly enlarged at 1.7 cm short axis. Resolution of the hypermetabolic airspace disease in the RIGHT upper lobe. No hypermetabolic mediastinal lymph nodes identified. ABDOMEN/PELVIS Normal metabolic activity in the spleen. No hypermetabolic periaortic retroperitoneal lymph nodes. No hypermetabolic iliac lymph nodes. Again demonstrated large ventral hernia.  Hernia contains only fat. SKELETON No focal hypermetabolic activity to suggest skeletal metastasis. IMPRESSION: 1. Mild residual metabolic activity within LEFT axillary lymph nodes ( Deauville 2 ). 2. No additional hypermetabolic adenopathy identified. 3. Normal spleen and marrow activity. Electronically Signed   By: Suzy Bouchard M.D.   On: 03/22/2016 10:20      IMPRESSION/PLAN:Today, I talked to the patient about the findings and work-up thus far. We discussed the patient's diagnosis of Hodgkins Lymphoma and general treatment for this, highlighting the role of radiotherapy in the management. We discussed the available radiation techniques, and focused on the details of logistics and delivery.    Since there  is no role for further chemotherapy per the discussion with med onc above in the HPI, I think that 4 weeks of 3D conformal radiotherapy to the left axilla while we largely spare esophagus, cord, lungs, and heart (36Gy in 20 fractions) will improve her local control. She understands it is unlikely to improve her OS.  She is enthusiastic to proceed.  We discussed the risks, benefits, and side effects of radiotherapy. Side effects may include but not necessarily be limited to: skin irritation, fatigue,lymphedema, rare internal organ injury.  No guarantees of treatment were given. A consent form was signed and placed in the patient's medical record. The patient was encouraged to ask questions that I answered to the best of my ability. Simulation to follow later this month.  Pt offered for rehab but she declines at  this time. __________________________________________   Eppie Gibson, MD

## 2016-04-05 DIAGNOSIS — C8114 Nodular sclerosis classical Hodgkin lymphoma, lymph nodes of axilla and upper limb: Secondary | ICD-10-CM | POA: Insufficient documentation

## 2016-04-12 DIAGNOSIS — E782 Mixed hyperlipidemia: Secondary | ICD-10-CM | POA: Diagnosis not present

## 2016-04-12 DIAGNOSIS — E119 Type 2 diabetes mellitus without complications: Secondary | ICD-10-CM | POA: Diagnosis not present

## 2016-04-13 ENCOUNTER — Encounter: Payer: Self-pay | Admitting: Internal Medicine

## 2016-04-13 ENCOUNTER — Other Ambulatory Visit (HOSPITAL_COMMUNITY): Payer: Self-pay

## 2016-04-13 ENCOUNTER — Ambulatory Visit (INDEPENDENT_AMBULATORY_CARE_PROVIDER_SITE_OTHER): Payer: 59 | Admitting: Internal Medicine

## 2016-04-13 DIAGNOSIS — B20 Human immunodeficiency virus [HIV] disease: Secondary | ICD-10-CM | POA: Diagnosis not present

## 2016-04-13 DIAGNOSIS — C8118 Nodular sclerosis classical Hodgkin lymphoma, lymph nodes of multiple sites: Secondary | ICD-10-CM

## 2016-04-13 DIAGNOSIS — I509 Heart failure, unspecified: Principal | ICD-10-CM

## 2016-04-13 DIAGNOSIS — I82432 Acute embolism and thrombosis of left popliteal vein: Secondary | ICD-10-CM

## 2016-04-13 DIAGNOSIS — I429 Cardiomyopathy, unspecified: Secondary | ICD-10-CM

## 2016-04-13 MED ORDER — FUROSEMIDE 40 MG PO TABS: 20.0000 mg | ORAL_TABLET | Freq: Every day | ORAL | 0 refills | Status: DC

## 2016-04-13 NOTE — Telephone Encounter (Signed)
Received refill request from patients pharmacy for Lasix. Reviewed with PA, chart checked and refilled.

## 2016-04-13 NOTE — Assessment & Plan Note (Signed)
Her HIV infection is under excellent, long-term control and she's had complete CD4 reconstitution now that she has completed chemotherapy. I had her talk with Onnie Boer, our ID pharmacist today to see if we can understand what is causing problems with her specialty pharmacy. She will follow-up after lab work in 6 months.

## 2016-04-13 NOTE — Progress Notes (Signed)
Patient Active Problem List   Diagnosis Date Noted  . Human immunodeficiency virus (HIV) disease ([place]) 02/23/2006    Priority: High  . 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[place]) 04/05/2016  . Acute deep vein thrombosis (DVT) of popliteal vein of left lower extremity ([place]) 02/09/2016  . Unintentional weight loss 12/16/2015  . Congestive heart failure with cardiomyopathy ([place]) 10/27/2015  . Acute congestive heart failure ([place]) 10/27/2015  . [name], nodular sclerosis ([place]) 08/23/2015  . Visual floaters 09/12/2011  . Episodic low back pain 11/15/2010  . ROTATOR CUFF INJURY, RIGHT SHOULDER 11/03/2008  . ADHESIVE CAPSULITIS OF SHOULDER 11/12/2007  . OBESITY NOS 05/17/2006  . SYNDROME, CARPAL TUNNEL 05/17/2006  . FOOT PAIN 05/17/2006  . Hyperlipidemia 02/23/2006  . DEPRESSION 02/23/2006  . Essential hypertension 02/23/2006    Patient's Medications  New Prescriptions   No medications on file  Previous Medications   CHOLECALCIFEROL (VITAMIN D) 1000 UNITS TABLET    Take 2,000 Units by mouth daily.   DOLUTEGRAVIR (TIVICAY) 50 MG TABLET    Take 1 tablet (50 mg total) by mouth daily.   EMTRICITABINE-RILPIVIR-TENOFOVIR AF (ODEFSEY) 200-25-25 MG TABS TABLET    Take 1 tablet by mouth daily.   FENOFIBRATE 160 MG TABLET    Take 160 mg by mouth daily.    FUROSEMIDE (LASIX) 40 MG TABLET    Take 0.5 tablets (20 mg total) by mouth daily.   GARLIC 123XX123 MG CAPS    Take 1,000 mg by mouth daily.    JANUMET 50-500 MG PER TABLET    Take 1 tablet by mouth 2 (two) times daily with a meal.    LIDOCAINE-PRILOCAINE (EMLA) CREAM    Apply 1 application topically as needed (prior to accessing port).   METOPROLOL TARTRATE (LOPRESSOR) 25 MG TABLET    Take 1 tablet (25 mg total) by mouth daily.   MULTIPLE VITAMIN (MULTIVITAMIN WITH MINERALS) TABS TABLET    Take 1 tablet by mouth daily.   POTASSIUM CHLORIDE SA (K-DUR,KLOR-CON) 20 MEQ TABLET       RIVAROXABAN (XARELTO) 20  MG TABS TABLET    Take 1 tablet (20 mg total) by mouth daily with supper.   SPIRONOLACTONE (ALDACTONE) 25 MG TABLET    Take 1 tablet (25 mg total) by mouth daily.  Modified Medications   No medications on file  Discontinued Medications   No medications on file    Subjective: Holly Stephens is in for her routine HIV follow-up visit. She has had no problems tolerating her Stella and has not missed any doses. She has had problems with her specialty pharmacy. She can no longer get a 90 day supply and she has to call for each refill. The last time she got her HIV meds she had a $50 co-pay that she had not been paying previously. She has completed chemotherapy for Hodgkin's disease. She still has some radiation therapy to complete. She is back at work full time. She is still more fatigued than usual but this is improving with time. She is not getting any regular exercise.   Review of Systems: Review of Systems  Constitutional: Positive for malaise/fatigue. Negative for chills, diaphoresis, fever and weight loss.  HENT: Negative for sore throat.   Respiratory: Negative for cough, sputum production and shortness of breath.   Cardiovascular: Negative for chest pain.  Gastrointestinal: Negative for abdominal pain, diarrhea, heartburn, nausea and vomiting.  Genitourinary: Negative for dysuria and  frequency.  Musculoskeletal: Positive for joint pain. Negative for myalgias.  Skin: Negative for rash.       Some redness and irritation around her nose recently.  Neurological: Positive for weakness. Negative for dizziness and headaches.  Psychiatric/Behavioral: Negative for depression. The patient is not nervous/anxious.     Past Medical History:  Diagnosis Date  . Acute deep vein thrombosis (DVT) of popliteal vein of left lower extremity ([place]) 02/09/2016  . Arthritis    osteoarthritis  . Diabetes mellitus without complication ([place])   . HIV infection ([place])   . [name], nodular  sclerosis ([place]) 08/23/2015  . Human immunodeficiency virus (HIV) disease ([place]) 02/23/2006   Qualifier: Diagnosis of  By: Megan Salon MD, Kolsen Choe      Social History  Substance Use Topics  . Smoking status: Never Smoker  . Smokeless tobacco: Never Used  . Alcohol use No    Family History  Problem Relation Age of Onset  . Hypertension Mother   . Cancer Mother 24    breast   . Hypertension Father   . Heart attack Father   . Pulmonary embolism Sister   . Parkinson's disease Brother     Allergies  Allergen Reactions  . Augmentin [Amoxicillin-Pot Clavulanate] Hives and Other (See Comments)    Has patient had a PCN reaction causing immediate rash, facial/tongue/throat swelling, SOB or lightheadedness with hypotension: No Has patient had a PCN reaction causing severe rash involving mucus membranes or skin necrosis: No Has patient had a PCN reaction that required hospitalization No Has patient had a PCN reaction occurring within the last 10 years: No If all of the above answers are "NO", then may proceed with Cephalosporin use.  . Bleomycin Cough    Objective:  Vitals:   04/13/16 1033  BP: 118/74  Pulse: 75  Temp: 98.3 F (36.8 C)  TempSrc: Oral  Weight: 257 lb 8 oz (116.8 kg)   Body mass index is 48.65 kg/m.  Physical Exam  Constitutional: She is oriented to person, place, and time.  She is in good spirits as usual. Her weight is unchanged.  HENT:  Mouth/Throat: No oropharyngeal exudate.  Cardiovascular: Normal rate and regular rhythm.   No murmur heard. Pulmonary/Chest: Breath sounds normal.  Abdominal: Soft. There is no tenderness.  Neurological: She is alert and oriented to person, place, and time.  Skin: No rash noted.    Lab Results Lab Results  Component Value Date   WBC 10.5 02/23/2016   HGB 12.2 02/23/2016   HCT 38.5 02/23/2016   MCV 96.3 02/23/2016   PLT 270 02/23/2016    Lab Results  Component Value Date   CREATININE 0.65 02/23/2016   BUN 11 02/23/2016    NA 140 02/23/2016   K 3.4 (L) 02/23/2016   CL 104 02/23/2016   CO2 29 02/23/2016    Lab Results  Component Value Date   ALT 41 02/23/2016   AST 43 (H) 02/23/2016   ALKPHOS 49 02/23/2016   BILITOT 0.3 02/23/2016    Lab Results  Component Value Date   CHOL 228 (H) 06/01/2015   HDL 38 (L) 06/01/2015   LDLCALC 158 (H) 06/01/2015   TRIG 162 (H) 06/01/2015   CHOLHDL 6.0 (H) 06/01/2015   HIV 1 RNA Quant (copies/mL)  Date Value  03/02/2016 <20  12/02/2015 <20  06/01/2015 <20   CD4 T Cell Abs (/uL)  Date Value  03/02/2016 530  12/02/2015 290 (L)  09/27/2015 810     Problem List Items  Addressed This Visit      High   Human immunodeficiency virus (HIV) disease ([place])    Her HIV infection is under excellent, long-term control and she's had complete CD4 reconstitution now that she has completed chemotherapy. I had her talk with Onnie Boer, our ID pharmacist today to see if we can understand what is causing problems with her specialty pharmacy. She will follow-up after lab work in 6 months.      Relevant Orders   T-helper cell (CD4)- ([facility] clinic only)   HIV 1 RNA quant-no reflex-bld   CBC   Comprehensive metabolic panel        Michel Bickers, MD [facility] [place] [phone] pager   [phone] cell 04/13/2016, 11:01 AM

## 2016-04-13 NOTE — Progress Notes (Signed)
HPI: Holly Stephens is a 61 y.o. female who is here for her HIV f/u.  Allergies: Allergies  Allergen Reactions  . Augmentin [Amoxicillin-Pot Clavulanate] Hives and Other (See Comments)    Has patient had a PCN reaction causing immediate rash, facial/tongue/throat swelling, SOB or lightheadedness with hypotension: No Has patient had a PCN reaction causing severe rash involving mucus membranes or skin necrosis: No Has patient had a PCN reaction that required hospitalization No Has patient had a PCN reaction occurring within the last 10 years: No If all of the above answers are "NO", then may proceed with Cephalosporin use.  . Bleomycin Cough    Vitals: Temp: 98.3 F (36.8 C) (02/22 1033) Temp Source: Oral (02/22 1033) BP: 118/74 (02/22 1033) Pulse Rate: 75 (02/22 1033)  Past Medical History: Past Medical History:  Diagnosis Date  . Acute deep vein thrombosis (DVT) of popliteal vein of left lower extremity ([place]) 02/09/2016  . Arthritis    osteoarthritis  . Diabetes mellitus without complication ([place])   . HIV infection ([place])   . [name], nodular sclerosis ([place]) 08/23/2015  . Human immunodeficiency virus (HIV) disease ([place]) 02/23/2006   Qualifier: Diagnosis of  By: Megan Salon MD, John      Social History: Social History   Social History  . Marital status: Widowed    Spouse name: N/A  . Number of children: N/A  . Years of education: N/A   Social History Main Topics  . Smoking status: Never Smoker  . Smokeless tobacco: Never Used  . Alcohol use No  . Drug use: No  . Sexual activity: No     Comment: declined condoms   Other Topics Concern  . None   Social History Narrative  . None    Previous Regimen: DRV/r, Prezcobix, [name], CBV  Current Regimen: Odefsey/DTG  Labs: HIV 1 RNA Quant (copies/mL)  Date Value  03/02/2016 <20  12/02/2015 <20  06/01/2015 <20   CD4 T Cell Abs (/uL)  Date Value  03/02/2016 530  12/02/2015 290 (L)  09/27/2015 810    Hep B S Ab (no units)  Date Value  04/16/2006 No   Hepatitis B Surface Ag (no units)  Date Value  09/13/2015 Negative   HCV Ab (s/co ratio)  Date Value  09/13/2015 <0.1    CrCl: CrCl cannot be calculated (Patient's most recent lab result is older than the maximum 21 days allowed.).  Lipids:    Component Value Date/Time   CHOL 228 (H) 06/01/2015 0900   TRIG 162 (H) 06/01/2015 0900   HDL 38 (L) 06/01/2015 0900   CHOLHDL 6.0 (H) 06/01/2015 0900   VLDL 32 (H) 06/01/2015 0900   LDLCALC 158 (H) 06/01/2015 0900    Assessment: Alysah's VL is doing fantastic but Dr. Megan Salon pulled me aside to let me know that her she is having some issue with the copay with [facility]. She got both cards activated. I had to re-activated the [place] card for her. Spent a good 40 mins on the phone with them to sort it out. Copays should be handled now.   Recommendations:  Cont same regimen Copay now should be resolved  Onnie Boer, PharmD, BCPS, AAHIVP, CPP Clinical Infectious [place] [facility] 04/13/2016, 11:33 AM

## 2016-04-14 DIAGNOSIS — I10 Essential (primary) hypertension: Secondary | ICD-10-CM | POA: Diagnosis not present

## 2016-04-14 DIAGNOSIS — E119 Type 2 diabetes mellitus without complications: Secondary | ICD-10-CM | POA: Diagnosis not present

## 2016-04-14 DIAGNOSIS — E782 Mixed hyperlipidemia: Secondary | ICD-10-CM | POA: Diagnosis not present

## 2016-04-17 ENCOUNTER — Ambulatory Visit
Admission: RE | Admit: 2016-04-17 | Discharge: 2016-04-17 | Disposition: A | Discharge status: Home / Self Care | Payer: 59 | Source: Ambulatory Visit | Attending: Radiation Oncology | Admitting: Radiation Oncology

## 2016-04-17 DIAGNOSIS — C8114 Nodular sclerosis classical Hodgkin lymphoma, lymph nodes of axilla and upper limb: Secondary | ICD-10-CM

## 2016-04-17 DIAGNOSIS — Z86718 Personal history of other venous thrombosis and embolism: Secondary | ICD-10-CM | POA: Diagnosis not present

## 2016-04-17 DIAGNOSIS — Z510 Encounter for antineoplastic radiation therapy: Secondary | ICD-10-CM | POA: Diagnosis not present

## 2016-04-17 NOTE — Progress Notes (Signed)
  Radiation Oncology         [phone] ________________________________  Name: Holly Stephens MRN: [id number]  Date: 04/17/2016  DOB: [birth year]  SIMULATION AND TREATMENT PLANNING NOTE    Outpatient  DIAGNOSIS:     ICD-9-CM ICD-10-CM   1. 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  Identity was confirmed.  All relevant records and images related to the planned course of therapy were reviewed.  The patient freely provided informed written consent to proceed with treatment after reviewing the details related to the planned course of therapy. The consent form was witnessed and verified by the simulation staff.    Then, the patient was set-up in a stable reproducible supine position for radiation therapy with her ipsilateral arm over her head, and her upper body secured in a custom-made Vac-lok device.  CT images were obtained.  Surface markings were placed.  The CT images were loaded into the planning software.    TREATMENT PLANNING NOTE: Treatment planning then occurred.  The radiation prescription was entered and confirmed.     A total of 3 medically necessary complex treatment devices were fabricated and supervised by me: 2 fields with MLCs for custom blocks to protect heart, and lungs;  and, a Vac-lok. MORE COMPLEX DEVICES MAY BE MADE IN DOSIMETRY FOR FIELD IN FIELD BEAMS FOR DOSE HOMOGENEITY.  I have requested : 3D Simulation which is medically necessary to give adequate dose to at risk tissues while sparing lungs and heart.  I have requested a DVH of the following structures: lungs, heart, CTV.    The patient will receive 36 Gy in 20 fractions to the left axilla with 2 tangential fields.   -----------------------------------  Eppie Gibson, MD

## 2016-04-21 DIAGNOSIS — Z86718 Personal history of other venous thrombosis and embolism: Secondary | ICD-10-CM | POA: Diagnosis not present

## 2016-04-21 DIAGNOSIS — C8114 Nodular sclerosis classical Hodgkin lymphoma, lymph nodes of axilla and upper limb: Secondary | ICD-10-CM | POA: Diagnosis not present

## 2016-04-21 DIAGNOSIS — Z510 Encounter for antineoplastic radiation therapy: Secondary | ICD-10-CM | POA: Diagnosis not present

## 2016-04-24 ENCOUNTER — Ambulatory Visit
Admission: RE | Admit: 2016-04-24 | Discharge: 2016-04-24 | Disposition: A | Discharge status: Home / Self Care | Payer: 59 | Source: Ambulatory Visit | Attending: Radiation Oncology | Admitting: Radiation Oncology

## 2016-04-24 DIAGNOSIS — C8114 Nodular sclerosis classical Hodgkin lymphoma, lymph nodes of axilla and upper limb: Secondary | ICD-10-CM | POA: Diagnosis not present

## 2016-04-24 DIAGNOSIS — Z510 Encounter for antineoplastic radiation therapy: Secondary | ICD-10-CM | POA: Diagnosis not present

## 2016-04-25 ENCOUNTER — Ambulatory Visit
Admission: RE | Admit: 2016-04-25 | Discharge: 2016-04-25 | Disposition: A | Discharge status: Home / Self Care | Payer: 59 | Source: Ambulatory Visit | Attending: Radiation Oncology | Admitting: Radiation Oncology

## 2016-04-25 DIAGNOSIS — Z510 Encounter for antineoplastic radiation therapy: Secondary | ICD-10-CM | POA: Diagnosis not present

## 2016-04-25 DIAGNOSIS — C8114 Nodular sclerosis classical Hodgkin lymphoma, lymph nodes of axilla and upper limb: Secondary | ICD-10-CM | POA: Diagnosis not present

## 2016-04-26 ENCOUNTER — Ambulatory Visit
Admission: RE | Admit: 2016-04-26 | Discharge: 2016-04-26 | Disposition: A | Discharge status: Home / Self Care | Payer: 59 | Source: Ambulatory Visit | Attending: Radiation Oncology | Admitting: Radiation Oncology

## 2016-04-26 DIAGNOSIS — Z510 Encounter for antineoplastic radiation therapy: Secondary | ICD-10-CM | POA: Diagnosis not present

## 2016-04-26 DIAGNOSIS — C8114 Nodular sclerosis classical Hodgkin lymphoma, lymph nodes of axilla and upper limb: Secondary | ICD-10-CM

## 2016-04-26 MED ORDER — RADIAPLEXRX EX GEL
Freq: Once | CUTANEOUS | Status: AC
  Administered 2016-04-26: 18:00:00 via TOPICAL

## 2016-04-26 MED ORDER — ALRA NON-METALLIC DEODORANT (RAD-ONC)
1.0000 "application " | Freq: Once | TOPICAL | Status: AC
  Administered 2016-04-26: 1 via TOPICAL

## 2016-04-26 NOTE — Progress Notes (Signed)
Pt here for patient teaching.  Pt given Radiation and You booklet, skin care instructions, Alra deodorant and Radiaplex gel.  Reviewed areas of pertinence such as fatigue, hair loss, skin changes, breast tenderness and breast swelling . Pt able to give teach back of to pat skin, use unscented/gentle soap and drink plenty of water,apply Radiaplex bid, avoid applying anything to skin within 4 hours of treatment, avoid wearing an under wire bra and to use an electric razor if they must shave. Pt verbalizes understanding of information given and will contact nursing with any questions or concerns.     [email or web address]/whattoexpect/index      

## 2016-04-27 ENCOUNTER — Ambulatory Visit
Admission: RE | Admit: 2016-04-27 | Discharge: 2016-04-27 | Disposition: A | Discharge status: Home / Self Care | Payer: 59 | Source: Ambulatory Visit | Attending: Radiation Oncology | Admitting: Radiation Oncology

## 2016-04-27 DIAGNOSIS — C8118 Nodular sclerosis classical Hodgkin lymphoma, lymph nodes of multiple sites: Secondary | ICD-10-CM | POA: Diagnosis not present

## 2016-04-27 DIAGNOSIS — C8114 Nodular sclerosis classical Hodgkin lymphoma, lymph nodes of axilla and upper limb: Secondary | ICD-10-CM | POA: Diagnosis not present

## 2016-04-27 DIAGNOSIS — I509 Heart failure, unspecified: Secondary | ICD-10-CM | POA: Diagnosis not present

## 2016-04-27 DIAGNOSIS — I429 Cardiomyopathy, unspecified: Secondary | ICD-10-CM | POA: Diagnosis not present

## 2016-04-27 DIAGNOSIS — Z510 Encounter for antineoplastic radiation therapy: Secondary | ICD-10-CM | POA: Diagnosis not present

## 2016-04-27 DIAGNOSIS — I82432 Acute embolism and thrombosis of left popliteal vein: Secondary | ICD-10-CM | POA: Diagnosis not present

## 2016-04-28 ENCOUNTER — Ambulatory Visit
Admission: RE | Admit: 2016-04-28 | Discharge: 2016-04-28 | Disposition: A | Discharge status: Home / Self Care | Payer: 59 | Source: Ambulatory Visit | Attending: Radiation Oncology | Admitting: Radiation Oncology

## 2016-04-28 DIAGNOSIS — Z510 Encounter for antineoplastic radiation therapy: Secondary | ICD-10-CM | POA: Diagnosis not present

## 2016-04-28 DIAGNOSIS — C8114 Nodular sclerosis classical Hodgkin lymphoma, lymph nodes of axilla and upper limb: Secondary | ICD-10-CM | POA: Diagnosis not present

## 2016-05-01 ENCOUNTER — Ambulatory Visit
Admission: RE | Admit: 2016-05-01 | Discharge: 2016-05-01 | Disposition: A | Discharge status: Home / Self Care | Payer: 59 | Source: Ambulatory Visit | Attending: Radiation Oncology | Admitting: Radiation Oncology

## 2016-05-01 ENCOUNTER — Encounter: Payer: Self-pay | Admitting: Radiation Oncology

## 2016-05-01 VITALS — BP 112/53 | HR 89 | Temp 98.0°F | Ht 61.0 in | Wt 259.0 lb

## 2016-05-01 DIAGNOSIS — C8114 Nodular sclerosis classical Hodgkin lymphoma, lymph nodes of axilla and upper limb: Secondary | ICD-10-CM

## 2016-05-01 DIAGNOSIS — Z510 Encounter for antineoplastic radiation therapy: Secondary | ICD-10-CM | POA: Diagnosis not present

## 2016-05-01 NOTE — Progress Notes (Signed)
Holly Stephens presents for her 5th fraction to Left Axilla. She denies pain. She does have some mild fatigue. She denies skin changes to her Left Axilla at this time. She is using Radiaplex twice daily. She has no other concerns at this time.   BP (!) 112/53   Pulse 89   Temp 98 F (36.7 C)   Ht 5\' 1"  (1.549 m)   Wt 259 lb (117.5 kg)   SpO2 97% Comment: room air  BMI 48.94 kg/m    Wt Readings from Last 3 Encounters:  05/01/16 259 lb (117.5 kg)  04/13/16 257 lb 8 oz (116.8 kg)  04/04/16 259 lb 9.6 oz (117.8 kg)

## 2016-05-01 NOTE — Progress Notes (Signed)
   Weekly Management Note:  Outpatient    ICD-9-CM ICD-10-CM   1. 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  CBCT/MVCT images/Port film x-rays were reviewed.  The chart was checked. Doing well.  Physical Findings:  height is 5\' 1"  (1.549 m) and weight is 259 lb (117.5 kg). Her temperature is 98 F (36.7 C). Her blood pressure is 112/53 (abnormal) and her pulse is 89. Her oxygen saturation is 97%.   Wt Readings from Last 3 Encounters:  05/01/16 259 lb (117.5 kg)  04/13/16 257 lb 8 oz (116.8 kg)  04/04/16 259 lb 9.6 oz (117.8 kg)   No skin irritation in left axilla  Impression:  The patient is tolerating radiotherapy.  Plan:  Continue radiotherapy as planned. Patient instructed to apply Radiplex to intact skin in treatment fields.      ________________________________   Eppie Gibson, M.D.

## 2016-05-02 ENCOUNTER — Ambulatory Visit: Payer: 59

## 2016-05-03 ENCOUNTER — Ambulatory Visit
Admission: RE | Admit: 2016-05-03 | Discharge: 2016-05-03 | Disposition: A | Discharge status: Home / Self Care | Payer: 59 | Source: Ambulatory Visit | Attending: Radiation Oncology | Admitting: Radiation Oncology

## 2016-05-03 DIAGNOSIS — C8114 Nodular sclerosis classical Hodgkin lymphoma, lymph nodes of axilla and upper limb: Secondary | ICD-10-CM | POA: Diagnosis not present

## 2016-05-03 DIAGNOSIS — Z510 Encounter for antineoplastic radiation therapy: Secondary | ICD-10-CM | POA: Diagnosis not present

## 2016-05-04 ENCOUNTER — Ambulatory Visit
Admission: RE | Admit: 2016-05-04 | Discharge: 2016-05-04 | Disposition: A | Discharge status: Home / Self Care | Payer: 59 | Source: Ambulatory Visit | Attending: Radiation Oncology | Admitting: Radiation Oncology

## 2016-05-04 DIAGNOSIS — Z510 Encounter for antineoplastic radiation therapy: Secondary | ICD-10-CM | POA: Diagnosis not present

## 2016-05-04 DIAGNOSIS — C8114 Nodular sclerosis classical Hodgkin lymphoma, lymph nodes of axilla and upper limb: Secondary | ICD-10-CM | POA: Diagnosis not present

## 2016-05-05 ENCOUNTER — Ambulatory Visit
Admission: RE | Admit: 2016-05-05 | Discharge: 2016-05-05 | Disposition: A | Discharge status: Home / Self Care | Payer: 59 | Source: Ambulatory Visit | Attending: Radiation Oncology | Admitting: Radiation Oncology

## 2016-05-05 DIAGNOSIS — C8114 Nodular sclerosis classical Hodgkin lymphoma, lymph nodes of axilla and upper limb: Secondary | ICD-10-CM | POA: Diagnosis not present

## 2016-05-05 DIAGNOSIS — Z510 Encounter for antineoplastic radiation therapy: Secondary | ICD-10-CM | POA: Diagnosis not present

## 2016-05-08 ENCOUNTER — Other Ambulatory Visit (HOSPITAL_COMMUNITY): Payer: 59

## 2016-05-08 ENCOUNTER — Encounter (HOSPITAL_COMMUNITY): Payer: Self-pay | Admitting: Adult Health

## 2016-05-08 ENCOUNTER — Encounter (HOSPITAL_BASED_OUTPATIENT_CLINIC_OR_DEPARTMENT_OTHER): Payer: 59

## 2016-05-08 ENCOUNTER — Encounter (HOSPITAL_COMMUNITY): Payer: 59 | Attending: Hematology & Oncology | Admitting: Adult Health

## 2016-05-08 ENCOUNTER — Encounter: Payer: Self-pay | Admitting: Radiation Oncology

## 2016-05-08 ENCOUNTER — Ambulatory Visit
Admission: RE | Admit: 2016-05-08 | Discharge: 2016-05-08 | Disposition: A | Discharge status: Home / Self Care | Payer: 59 | Source: Ambulatory Visit | Attending: Radiation Oncology | Admitting: Radiation Oncology

## 2016-05-08 ENCOUNTER — Ambulatory Visit (HOSPITAL_COMMUNITY): Payer: 59

## 2016-05-08 VITALS — BP 138/66 | HR 88 | Temp 97.7°F | Resp 18 | Ht 61.0 in | Wt 256.0 lb

## 2016-05-08 VITALS — BP 117/63 | HR 74 | Temp 97.7°F | Resp 20 | Ht 61.0 in | Wt 257.4 lb

## 2016-05-08 DIAGNOSIS — I509 Heart failure, unspecified: Secondary | ICD-10-CM | POA: Diagnosis not present

## 2016-05-08 DIAGNOSIS — C8118 Nodular sclerosis classical Hodgkin lymphoma, lymph nodes of multiple sites: Secondary | ICD-10-CM | POA: Diagnosis not present

## 2016-05-08 DIAGNOSIS — C8114 Nodular sclerosis classical Hodgkin lymphoma, lymph nodes of axilla and upper limb: Secondary | ICD-10-CM | POA: Diagnosis not present

## 2016-05-08 DIAGNOSIS — I82432 Acute embolism and thrombosis of left popliteal vein: Secondary | ICD-10-CM

## 2016-05-08 DIAGNOSIS — E876 Hypokalemia: Secondary | ICD-10-CM | POA: Diagnosis not present

## 2016-05-08 DIAGNOSIS — Z510 Encounter for antineoplastic radiation therapy: Secondary | ICD-10-CM | POA: Diagnosis not present

## 2016-05-08 DIAGNOSIS — I429 Cardiomyopathy, unspecified: Secondary | ICD-10-CM | POA: Diagnosis not present

## 2016-05-08 DIAGNOSIS — Z95828 Presence of other vascular implants and grafts: Secondary | ICD-10-CM

## 2016-05-08 DIAGNOSIS — B20 Human immunodeficiency virus [HIV] disease: Secondary | ICD-10-CM | POA: Diagnosis not present

## 2016-05-08 LAB — COMPREHENSIVE METABOLIC PANEL
ALT: 27 U/L (ref 14–54)
ANION GAP: 9 (ref 5–15)
AST: 27 U/L (ref 15–41)
Albumin: 3.9 g/dL (ref 3.5–5.0)
Alkaline Phosphatase: 41 U/L (ref 38–126)
BUN: 16 mg/dL (ref 6–20)
CALCIUM: 9.5 mg/dL (ref 8.9–10.3)
CHLORIDE: 100 mmol/L — AB (ref 101–111)
CO2: 25 mmol/L (ref 22–32)
Creatinine, Ser: 0.74 mg/dL (ref 0.44–1.00)
GFR calc Af Amer: >60 mL/min (ref >60)
Glucose, Bld: 160 mg/dL — ABNORMAL HIGH (ref 65–99)
POTASSIUM: 3.1 mmol/L — AB (ref 3.5–5.1)
Sodium: 134 mmol/L — ABNORMAL LOW (ref 135–145)
TOTAL PROTEIN: 7.1 g/dL (ref 6.5–8.1)
Total Bilirubin: 0.1 mg/dL — ABNORMAL LOW (ref 0.3–1.2)

## 2016-05-08 LAB — CBC WITH DIFFERENTIAL/PLATELET
Basophils Absolute: 0 10*3/uL (ref 0.0–0.1)
Basophils Relative: 0 %
EOS PCT: 3 %
Eosinophils Absolute: 0.2 10*3/uL (ref 0.0–0.7)
HCT: 39.9 % (ref 36.0–46.0)
Hemoglobin: 12.9 g/dL (ref 12.0–15.0)
LYMPHS ABS: 2.2 10*3/uL (ref 0.7–4.0)
LYMPHS PCT: 32 %
MCH: 29 pg (ref 26.0–34.0)
MCHC: 32.3 g/dL (ref 30.0–36.0)
MCV: 89.7 fL (ref 78.0–100.0)
MONO ABS: 0.6 10*3/uL (ref 0.1–1.0)
MONOS PCT: 8 %
Neutro Abs: 4 10*3/uL (ref 1.7–7.7)
Neutrophils Relative %: 57 %
PLATELETS: 295 10*3/uL (ref 150–400)
RBC: 4.45 MIL/uL (ref 3.87–5.11)
RDW: 15.2 % (ref 11.5–15.5)
WBC: 7 10*3/uL (ref 4.0–10.5)

## 2016-05-08 LAB — LACTATE DEHYDROGENASE: LDH: 120 U/L (ref 98–192)

## 2016-05-08 MED ORDER — SODIUM CHLORIDE 0.9% FLUSH
10.0000 mL | INTRAVENOUS | Status: DC | PRN
Start: 2016-05-08 — End: 2016-05-08
  Administered 2016-05-08: 10 mL via INTRAVENOUS
  Filled 2016-05-08: qty 10

## 2016-05-08 MED ORDER — HEPARIN SOD (PORK) LOCK FLUSH 100 UNIT/ML IV SOLN
500.0000 [IU] | Freq: Once | INTRAVENOUS | Status: AC
  Administered 2016-05-08: 500 [IU] via INTRAVENOUS

## 2016-05-08 MED ORDER — FUROSEMIDE 40 MG PO TABS: 20.0000 mg | ORAL_TABLET | Freq: Every day | ORAL | 1 refills | Status: DC

## 2016-05-08 NOTE — Progress Notes (Signed)
   Weekly Management Note:  Outpatient    ICD-9-CM ICD-10-CM   1. 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  CBCT/MVCT images/Port film x-rays were reviewed.  The chart was checked. Doing well.  Physical Findings:  height is 5\' 1"  (1.549 m) and weight is 257 lb 6.4 oz (116.8 kg). Her oral temperature is 97.7 F (36.5 C). Her blood pressure is 117/63 and her pulse is 74. Her respiration is 20.   Wt Readings from Last 3 Encounters:  05/08/16 257 lb 6.4 oz (116.8 kg)  05/01/16 259 lb (117.5 kg)  04/13/16 257 lb 8 oz (116.8 kg)   No skin irritation in left axilla  Impression:  The patient is tolerating radiotherapy.  Plan:  Continue radiotherapy as planned. Patient instructed to apply Radiplex to intact skin in treatment fields.     ________________________________   Eppie Gibson, M.D.

## 2016-05-08 NOTE — Progress Notes (Signed)
Weekly rad txs left axilla,9/20 completed,  no skin changes except at incision, slight pink, uses radiaplex bid most days, appetite good, staying hydrated, mild fatigue stated 10:43 AM BP 117/63 (BP Location: Right Arm, Patient Position: Sitting, Cuff Size: Normal)   Pulse 74   Temp 97.7 F (36.5 C) (Oral)   Resp 20   Ht 5\' 1"  (1.549 m)   Wt 257 lb 6.4 oz (116.8 kg)   BMI 48.64 kg/m   Wt Readings from Last 3 Encounters:  05/08/16 257 lb 6.4 oz (116.8 kg)  05/01/16 259 lb (117.5 kg)  04/13/16 257 lb 8 oz (116.8 kg)   Gaspar Garbe, RN II Rad/Onc

## 2016-05-08 NOTE — Progress Notes (Signed)
Holly Stephens presented for Portacath access and flush.  Portacath located right chest wall accessed with  H 20 needle. No blood return - flushes w/o difficulty with no s/s infiltration at site. Portacath flushed with 66ml NS and 500U/68ml Heparin and needle removed intact.  Procedure tolerated well and without incident.

## 2016-05-08 NOTE — Progress Notes (Signed)
[place] Holly Stephens, Holly Stephens [id number]   CLINIC:  Medical Oncology/Hematology  PCP:  [name], MD [place] [address] [phone]   REASON FOR VISIT:  Follow-up for Hodgkin's Lymphoma, nodular-sclerosing type with multiple lymph node involvement AND Left LE DVT  CURRENT THERAPY: Radiation therapy; current plan to complete radiation on 05/23/16 AND Xarelto    BRIEF ONCOLOGIC HISTORY:    [name], nodular sclerosis ([place])   07/23/2015 Procedure    Lext axilla lymph node biopsy      07/27/2015 Pathology Results    Classical [name], nodular sclerosis type.  Positive CD30, CD15, andPAX-5.  Negative CD45 and CD20.      08/31/2015 PET scan    Hypermetabolic lymphadenopathy. Nodal stations include: L supraclavicular nodes, L axillary nodes, mediastinal lymph nodes, periaortic retroperitoneal and periportal lymph nodes as well as L iliac and inguinal lymph nodes. Two concerning pulm nodules...      09/01/2015 Imaging    MUGA- Normal LEFT ventricular ejection fraction of 73%.  Normal LEFT ventricular wall motion.      09/01/2015 Procedure    PFTs-  Spirometry shows mild ventilatory defect without airflow obstruction and no bronchodilator improvement. Lung volumes are normal. Airway resistance is normal. DLCO normal. Essentially normal lung function      09/08/2015 Procedure    Port placed by Dr. Rosana Hoes      09/13/2015 -  Chemotherapy    ABVD      09/27/2015 Imaging    CT angio of chest- no gross PE seen. Improving axillary, mediastinal and upper abdominal adenopathy. Improved and possibly resolved RUL nodule and with resolved or obscured LLL nodule. Small L pleural effusion and mild L lower lobe atelectasis.      10/11/2015 Treatment Plan Change    Bleomycin discontinued       11/01/2015 PET scan    Clear interval response to therapy with decrease in size and hypermetabolism of larger lymph nodes seen previously and  some of the smaller hypermetabolic lymph node seen on the previous study have resolved completely in terms of abnormal enlargement and hypermetabolism. 2. Slight decrease in left lower lobe pulmonary nodule with interval resolution of the right upper lobe pulmonary nodule. 3. Stable appearance of mild heterogeneous FDG accumulation within the marrow space.      11/01/2015 Miscellaneous    Deauville 3- 4 more cycles of AVD recommended according to NCCN guidelines.      02/09/2016 Imaging    [place] left leg- Positive for acute DVT in left calf, popliteal and femoral veins.      03/22/2016 PET scan    1. Mild residual metabolic activity within LEFT axillary lymph nodes ( Deauville 2 ). 2. No additional hypermetabolic adenopathy identified. 3. Normal spleen and marrow activity.        HISTORY OF PRESENT ILLNESS:  (From [name], PA-C's last note on 03/27/16)     INTERVAL HISTORY:  Holly Stephens 61 y.o. female returns for follow-up for nodular-sclerosing Hodgkin's lymphoma.   Overall, she feels well.  Her energy is a little low; appetite remains good.  She recently had some sinus issues; she saw her PCP who recommended she start Mucinex and Allegra, which relieved her symptoms.  She continues to tolerate the Xarelto with largely no side effects. Denies active bleeding from her nose, gums, blood in her stools/melena, hematuria, or vaginal bleeding.  Denies any new leg swelling/warmth/skin changes/pain.  Denies new shortness of breath  or cough. Her bowels are moving well.     REVIEW OF SYSTEMS:  Review of Systems  Constitutional: Positive for fatigue. Negative for chills and fever.  HENT:  Negative.  Negative for nosebleeds.   Eyes: Negative.   Respiratory: Negative.  Negative for cough and shortness of breath.   Cardiovascular: Negative.  Negative for chest pain and leg swelling.  Gastrointestinal: Negative.  Negative for abdominal pain, blood in stool, constipation, diarrhea, nausea and  vomiting.  Endocrine: Negative.   Genitourinary: Negative.  Negative for dysuria and hematuria.   Musculoskeletal: Negative.  Negative for arthralgias.  Skin: Negative.        No open lesions to left axilla while currently undergoing XRT; applies radiation lotion appropriately.   Neurological: Negative.  Negative for dizziness and headaches.  Hematological: Negative.  Negative for adenopathy. Does not bruise/bleed easily.  Psychiatric/Behavioral: Negative.  Negative for depression and sleep disturbance. The patient is not nervous/anxious.      PAST MEDICAL/SURGICAL HISTORY:  Past Medical History:  Diagnosis Date  . Acute deep vein thrombosis (DVT) of popliteal vein of left lower extremity ([facility]) 02/09/2016  . Arthritis    osteoarthritis  . Diabetes mellitus without complication ([place])   . HIV infection ([place])   . [name], nodular sclerosis ([place]) 08/23/2015  . Human immunodeficiency virus (HIV) disease ([place]) 02/23/2006   Qualifier: Diagnosis of  By: Megan Salon MD, John     Past Surgical History:  Procedure Laterality Date  . AXILLARY LYMPH NODE BIOPSY Left 07/23/2015   Procedure: EXCISIONAL BIOPSY LEFT AXILLARY LYMPH NODE;  Surgeon: [name], MD;  Location: [facility];  Service: General;  Laterality: Left;  . PORTACATH PLACEMENT Right 09/08/2015   Procedure: INSERTION OF CENTRAL VENOUS CATHETER WITH PORT FOR CHEMOTHERAPY;  Surgeon: [name], MD;  Location: [facility];  Service: General;  Laterality: Right;  . SHOULDER SURGERY Right    removal of bone spur     SOCIAL HISTORY:  Social History   Social History  . Marital status: Widowed    Spouse name: N/A  . Number of children: N/A  . Years of education: N/A   Occupational History  . Not on file.   Social History Main Topics  . Smoking status: Never Smoker  . Smokeless tobacco: Never Used  . Alcohol use No  . Drug use: No  . Sexual activity: No     Comment: declined condoms   Other Topics Concern  . Not on  file   Social History Narrative  . No narrative on file    FAMILY HISTORY:  Family History  Problem Relation Age of Onset  . Hypertension Mother   . Cancer Mother 93    breast   . Hypertension Father   . Heart attack Father   . Pulmonary embolism Sister   . Parkinson's disease Brother     CURRENT MEDICATIONS:  Outpatient Encounter Prescriptions as of 05/08/2016  Medication Sig  . cholecalciferol (VITAMIN D) 1000 units tablet Take 2,000 Units by mouth daily.  . dolutegravir (TIVICAY) 50 MG tablet Take 1 tablet (50 mg total) by mouth daily.  Marland Kitchen emollient (RADIAGEL) gel Apply topically as needed for wound care.  Marland Kitchen emtricitabine-rilpivir-tenofovir AF (ODEFSEY) 200-25-25 MG TABS tablet Take 1 tablet by mouth daily.  . fenofibrate 160 MG tablet Take 160 mg by mouth daily.   . furosemide (LASIX) 40 MG tablet Take 0.5 tablets (20 mg total) by mouth daily.  . Garlic 3762 MG CAPS Take 1,000 mg  by mouth daily.   Marland Kitchen JANUMET 50-500 MG per tablet Take 1 tablet by mouth 2 (two) times daily with a meal.   . lidocaine-prilocaine (EMLA) cream Apply 1 application topically as needed (prior to accessing port).  . metoprolol tartrate (LOPRESSOR) 25 MG tablet Take 1 tablet (25 mg total) by mouth daily.  . Multiple Vitamin (MULTIVITAMIN WITH MINERALS) TABS tablet Take 1 tablet by mouth daily.  . potassium chloride SA (K-DUR,KLOR-CON) 20 MEQ tablet   . rivaroxaban (XARELTO) 20 MG TABS tablet Take 1 tablet (20 mg total) by mouth daily with supper.  Marland Kitchen spironolactone (ALDACTONE) 25 MG tablet Take 1 tablet (25 mg total) by mouth daily.  . [DISCONTINUED] furosemide (LASIX) 40 MG tablet Take 0.5 tablets (20 mg total) by mouth daily.   No facility-administered encounter medications on file as of 05/08/2016.     ALLERGIES:  Allergies  Allergen Reactions  . Augmentin [Amoxicillin-Pot Clavulanate] Hives and Other (See Comments)    Has patient had a PCN reaction causing immediate rash, facial/tongue/throat  swelling, SOB or lightheadedness with hypotension: No Has patient had a PCN reaction causing severe rash involving mucus membranes or skin necrosis: No Has patient had a PCN reaction that required hospitalization No Has patient had a PCN reaction occurring within the last 10 years: No If all of the above answers are "NO", then may proceed with Cephalosporin use.  . Bleomycin Cough     PHYSICAL EXAM:  ECOG Performance status: 1 - Symptomatic, but largely independent   Vitals:   05/08/16 1355  BP: 138/66  Pulse: 88  Resp: 18  Temp: 97.7 F (36.5 C)   Filed Weights   05/08/16 1355  Weight: 256 lb (116.1 kg)    Physical Exam  Constitutional: She is oriented to person, place, and time and well-developed, well-nourished, and in no distress.  HENT:  Head: Normocephalic.  Mouth/Throat: Oropharynx is clear and moist. No oropharyngeal exudate.  Eyes: Conjunctivae are normal. Pupils are equal, round, and reactive to light. No scleral icterus.  Neck: Normal range of motion. Neck supple.  Cardiovascular: Normal rate, regular rhythm and normal heart sounds.   Pulmonary/Chest: Effort normal and breath sounds normal. No respiratory distress.  Abdominal: Soft. Bowel sounds are normal. There is no tenderness. There is no rebound and no guarding.  Musculoskeletal: Normal range of motion. She exhibits no edema.  Lymphadenopathy:    She has no cervical adenopathy.    She has axillary adenopathy.       Right: No supraclavicular adenopathy present.       Left: No supraclavicular adenopathy present.  (L) axillary lymph node palpable, but clinically appears to have decreased in size.  Palpable mass is ~1 cm x 1 cm in deep axillary tissue. No other adenopathy appreciated.   Neurological: She is alert and oriented to person, place, and time. No cranial nerve deficit. Gait normal.  Skin: Skin is warm and dry.  Psychiatric: Mood, memory, affect and judgment normal.  Nursing note and vitals  reviewed.    LABORATORY DATA:  I have reviewed the labs as listed.  CBC    Component Value Date/Time   WBC 7.0 05/08/2016 1425   RBC 4.45 05/08/2016 1425   HGB 12.9 05/08/2016 1425   HCT 39.9 05/08/2016 1425   PLT 295 05/08/2016 1425   MCV 89.7 05/08/2016 1425   MCH 29.0 05/08/2016 1425   MCHC 32.3 05/08/2016 1425   RDW 15.2 05/08/2016 1425   LYMPHSABS 2.2 05/08/2016 1425   MONOABS  0.6 05/08/2016 1425   EOSABS 0.2 05/08/2016 1425   BASOSABS 0.0 05/08/2016 1425   CMP Latest Ref Rng & Units 05/08/2016 02/23/2016 02/09/2016  Glucose 65 - 99 mg/dL 160(H) 150(H) 168(H)  BUN 6 - 20 mg/dL 16 11 11   Creatinine 0.44 - 1.00 mg/dL 0.74 0.65 0.80  Sodium 135 - 145 mmol/L 134(L) 140 140  Potassium 3.5 - 5.1 mmol/L 3.1(L) 3.4(L) 3.5  Chloride 101 - 111 mmol/L 100(L) 104 104  CO2 22 - 32 mmol/L 25 29 25   Calcium 8.9 - 10.3 mg/dL 9.5 9.4 9.3  Total Protein 6.5 - 8.1 g/dL 7.1 6.6 6.6  Total Bilirubin 0.3 - 1.2 mg/dL 0.1(L) 0.3 0.4  Alkaline Phos 38 - 126 U/L 41 49 33(L)  AST 15 - 41 U/L 27 43(H) 30  ALT 14 - 54 U/L 27 41 30    PENDING LABS:    DIAGNOSTIC IMAGING:  Most recent PET scan: 03/22/16     PATHOLOGY:  (L) axillary biopsy: 07/23/15    ASSESSMENT & PLAN:   Hodgkin's Lymphoma, nodular-sclerosing type involving multiple lymph nodes:  -CD30, CD15, & PAX-5 positive; s/p ABVD chemotherapy beginning on 09/13/15, with discontinuation of Bleomycin on 10/11/15.  Mid-course PET scan revealed Deauville 3 lymphadenopathy, so she received 4 additional cycles of AVD.  Chemo course was complicated by diastolic heart failure (followed by Dr. Haroldine Laws), in addition to HIV (followed by Dr. Megan Salon with ID). Chemotherapy completed on 02/23/16. Post-treatment PET revealed mild residual (L) axillary adenopathy (Deauville 2), so she was referred for axillary radiation therapy.  -Currently undergoing XRT to left axilla with plans to complete treatment on 05/23/16.  -She will be due for repeat PET  about 8 weeks after completion of radiation; orders placed today.  -Return to cancer center in early 07/2016 after PET scan to discuss additional treatment plans, if needed.  Left LE DVT:  -Continue Xarelto. Tolerating well; no active bleeding episodes.  -No new leg swelling, erythema, warmth, or pain.   -Of note, her insurance company requires 30-day supply of the Xarelto and her co-pay is ~$100/month. This is feasible for her now, but we may need to find an alternative if she requires long-term anticoagulation.   Diastolic heart failure:  -Continue Lasix; refilled today.  -Maintain follow-up with Dr. Haroldine Laws, as clinically indicated.   Hypokalemia:  -Serum potassium 3.1 today; likely secondary to diuretics.  -Will increase her potassium supplementation to 40 mEq po BID.   HIV:  -Under excellent control per Infectious Diseases. Continue current anti-viral regimen.  -Maintain follow-up with Dr. Megan Salon, as clinically indicated.       Dispo:  -PET scan in early 07/2016 to assess response to therapy.  -Return to cancer center for follow-up visit in early 07/2016 to review results of PET scan and any additional treatment planning.    All questions were answered to patient's stated satisfaction. Encouraged patient to call with any new concerns or questions before her next visit to the cancer center and we can certain see her sooner, if needed.      Orders placed this encounter:  Orders Placed This Encounter  Procedures  . NM PET Image Restag (PS) Skull Base To Thigh      Mike Craze, NP [place] [phone]

## 2016-05-08 NOTE — Patient Instructions (Signed)
[place] at [facility] Discharge Instructions  RECOMMENDATIONS MADE BY THE CONSULTANT AND ANY TEST RESULTS WILL BE SENT TO YOUR REFERRING PHYSICIAN.  You were seen today by Mike Craze NP. PET scan in May before follow up. Return in mid May for follow up and port flush/labs.  Thank you for choosing [place] at [facility] to provide your oncology and hematology care.  To afford each patient quality time with our provider, please arrive at least 15 minutes before your scheduled appointment time.    If you have a lab appointment with the [place] [facility] [place] please come in thru the  Main Entrance and check in at the main information desk  You need to re-schedule your appointment should you arrive 10 or more minutes late.  We strive to give you quality time with our providers, and arriving late affects you and other patients whose appointments are after yours.  Also, if you no show three or more times for appointments you may be dismissed from the clinic at the providers discretion.     Again, thank you for choosing [facility].  Our hope is that these requests will decrease the amount of time that you wait before being seen by our physicians.       _____________________________________________________________  Should you have questions after your visit to [facility], please contact our office at [phone] between the hours of 8:30 a.m. and 4:30 p.m.  Voicemails left after 4:30 p.m. will not be returned until the following business day.  For prescription refill requests, have your pharmacy contact our office.       Resources For Cancer Patients and their Caregivers ? American Cancer Society: Can assist with transportation, wigs, general needs, runs Look Good Feel Better.        (308)727-8006 ? Cancer Care: Provides financial assistance, online support groups, medication/co-pay assistance.  1-800-813-HOPE  (603)403-1421) ? [facility] Assists [place] cancer patients and their families through emotional , educational and financial support.  (905)541-2214 ? [place] DSS Where to apply for food stamps, Medicaid and utility assistance. 701 133 0213 ? [facility]: Transportation to medical appointments. (862) 688-8847 ? Social Security Administration: May apply for disability if have a Stage IV cancer. 205-885-5990 740-163-1643 ? [employer], Disability and Transit Services: Assists with nutrition, care and transit needs. [place] Support Programs: @10RELATIVEDAYS @ > Cancer Support Group  2nd Tuesday of the month 1pm-2pm, Journey Room  > Creative Journey  3rd Tuesday of the month 1130am-1pm, Journey Room  > Look Good Feel Better  1st Wednesday of the month 10am-12 noon, Journey Room (Call [place] to register [phone])

## 2016-05-09 ENCOUNTER — Ambulatory Visit
Admission: RE | Admit: 2016-05-09 | Discharge: 2016-05-09 | Disposition: A | Discharge status: Home / Self Care | Payer: 59 | Source: Ambulatory Visit | Attending: Radiation Oncology | Admitting: Radiation Oncology

## 2016-05-09 ENCOUNTER — Ambulatory Visit: Payer: 59

## 2016-05-09 DIAGNOSIS — Z510 Encounter for antineoplastic radiation therapy: Secondary | ICD-10-CM | POA: Diagnosis not present

## 2016-05-09 DIAGNOSIS — C8114 Nodular sclerosis classical Hodgkin lymphoma, lymph nodes of axilla and upper limb: Secondary | ICD-10-CM | POA: Diagnosis not present

## 2016-05-09 MED ORDER — POTASSIUM CHLORIDE CRYS ER 20 MEQ PO TBCR: 40.0000 meq | EXTENDED_RELEASE_TABLET | Freq: Two times a day (BID) | ORAL | 3 refills | Status: DC

## 2016-05-10 ENCOUNTER — Ambulatory Visit
Admission: RE | Admit: 2016-05-10 | Discharge: 2016-05-10 | Disposition: A | Discharge status: Home / Self Care | Payer: 59 | Source: Ambulatory Visit | Attending: Radiation Oncology | Admitting: Radiation Oncology

## 2016-05-10 DIAGNOSIS — C8114 Nodular sclerosis classical Hodgkin lymphoma, lymph nodes of axilla and upper limb: Secondary | ICD-10-CM | POA: Diagnosis not present

## 2016-05-10 DIAGNOSIS — Z510 Encounter for antineoplastic radiation therapy: Secondary | ICD-10-CM | POA: Diagnosis not present

## 2016-05-11 ENCOUNTER — Telehealth (HOSPITAL_COMMUNITY): Payer: Self-pay | Admitting: *Deleted

## 2016-05-11 ENCOUNTER — Ambulatory Visit
Admission: RE | Admit: 2016-05-11 | Discharge: 2016-05-11 | Disposition: A | Discharge status: Home / Self Care | Payer: 59 | Source: Ambulatory Visit | Attending: Radiation Oncology | Admitting: Radiation Oncology

## 2016-05-11 DIAGNOSIS — Z510 Encounter for antineoplastic radiation therapy: Secondary | ICD-10-CM | POA: Diagnosis not present

## 2016-05-11 DIAGNOSIS — C8114 Nodular sclerosis classical Hodgkin lymphoma, lymph nodes of axilla and upper limb: Secondary | ICD-10-CM | POA: Diagnosis not present

## 2016-05-11 NOTE — Telephone Encounter (Signed)
LMOM for pt to call us back about her potassium.

## 2016-05-11 NOTE — Telephone Encounter (Signed)
-----   Message from Holley Bouche, NP sent at 05/09/2016  6:34 AM EDT ----- Regarding: Potassium Will you please call patient and ask how she is taking her potassium?  I am going to increase her to 40 mEq BID and prescription sent to her [facility].    If she is currently taking this much, just let me know and I'll need to increase her dose.   Thanks! gwd

## 2016-05-12 ENCOUNTER — Ambulatory Visit
Admission: RE | Admit: 2016-05-12 | Discharge: 2016-05-12 | Disposition: A | Discharge status: Home / Self Care | Payer: 59 | Source: Ambulatory Visit | Attending: Radiation Oncology | Admitting: Radiation Oncology

## 2016-05-12 ENCOUNTER — Other Ambulatory Visit (HOSPITAL_COMMUNITY): Payer: Self-pay | Admitting: Adult Health

## 2016-05-12 ENCOUNTER — Telehealth (HOSPITAL_COMMUNITY): Payer: Self-pay | Admitting: *Deleted

## 2016-05-12 DIAGNOSIS — Z510 Encounter for antineoplastic radiation therapy: Secondary | ICD-10-CM | POA: Diagnosis not present

## 2016-05-12 DIAGNOSIS — C8114 Nodular sclerosis classical Hodgkin lymphoma, lymph nodes of axilla and upper limb: Secondary | ICD-10-CM | POA: Diagnosis not present

## 2016-05-12 DIAGNOSIS — E876 Hypokalemia: Secondary | ICD-10-CM

## 2016-05-12 DIAGNOSIS — C8118 Nodular sclerosis classical Hodgkin lymphoma, lymph nodes of multiple sites: Secondary | ICD-10-CM

## 2016-05-12 NOTE — Telephone Encounter (Signed)
Spoke with patient via telephone.  She reports not taking any potassium at this time due to her primary care provider taking her off the potassium when she started the Aldactone.  Fortunato Curling, NP advises that patient should take potassium 30mEq BID x 2 weeks and then come in for lab work.  Patient verbalizes understanding and appointments have been made for labs in 2 weeks.

## 2016-05-15 ENCOUNTER — Ambulatory Visit
Admission: RE | Admit: 2016-05-15 | Discharge: 2016-05-15 | Disposition: A | Discharge status: Home / Self Care | Payer: 59 | Source: Ambulatory Visit | Attending: Radiation Oncology | Admitting: Radiation Oncology

## 2016-05-15 DIAGNOSIS — Z510 Encounter for antineoplastic radiation therapy: Secondary | ICD-10-CM | POA: Diagnosis not present

## 2016-05-15 DIAGNOSIS — C8114 Nodular sclerosis classical Hodgkin lymphoma, lymph nodes of axilla and upper limb: Secondary | ICD-10-CM | POA: Diagnosis not present

## 2016-05-16 ENCOUNTER — Ambulatory Visit
Admission: RE | Admit: 2016-05-16 | Discharge: 2016-05-16 | Disposition: A | Discharge status: Home / Self Care | Payer: 59 | Source: Ambulatory Visit | Attending: Radiation Oncology | Admitting: Radiation Oncology

## 2016-05-16 DIAGNOSIS — Z510 Encounter for antineoplastic radiation therapy: Secondary | ICD-10-CM | POA: Diagnosis not present

## 2016-05-16 DIAGNOSIS — C8114 Nodular sclerosis classical Hodgkin lymphoma, lymph nodes of axilla and upper limb: Secondary | ICD-10-CM | POA: Diagnosis not present

## 2016-05-17 ENCOUNTER — Ambulatory Visit
Admission: RE | Admit: 2016-05-17 | Discharge: 2016-05-17 | Disposition: A | Discharge status: Home / Self Care | Payer: 59 | Source: Ambulatory Visit | Attending: Radiation Oncology | Admitting: Radiation Oncology

## 2016-05-17 DIAGNOSIS — Z510 Encounter for antineoplastic radiation therapy: Secondary | ICD-10-CM | POA: Diagnosis not present

## 2016-05-17 DIAGNOSIS — C8114 Nodular sclerosis classical Hodgkin lymphoma, lymph nodes of axilla and upper limb: Secondary | ICD-10-CM | POA: Diagnosis not present

## 2016-05-18 ENCOUNTER — Ambulatory Visit
Admission: RE | Admit: 2016-05-18 | Discharge: 2016-05-18 | Disposition: A | Discharge status: Home / Self Care | Payer: 59 | Source: Ambulatory Visit | Attending: Radiation Oncology | Admitting: Radiation Oncology

## 2016-05-18 DIAGNOSIS — Z510 Encounter for antineoplastic radiation therapy: Secondary | ICD-10-CM | POA: Diagnosis not present

## 2016-05-18 DIAGNOSIS — C8114 Nodular sclerosis classical Hodgkin lymphoma, lymph nodes of axilla and upper limb: Secondary | ICD-10-CM | POA: Diagnosis not present

## 2016-05-19 ENCOUNTER — Ambulatory Visit
Admission: RE | Admit: 2016-05-19 | Discharge: 2016-05-19 | Disposition: A | Discharge status: Home / Self Care | Payer: 59 | Source: Ambulatory Visit | Attending: Radiation Oncology | Admitting: Radiation Oncology

## 2016-05-19 DIAGNOSIS — Z510 Encounter for antineoplastic radiation therapy: Secondary | ICD-10-CM | POA: Diagnosis not present

## 2016-05-19 DIAGNOSIS — C8114 Nodular sclerosis classical Hodgkin lymphoma, lymph nodes of axilla and upper limb: Secondary | ICD-10-CM | POA: Diagnosis not present

## 2016-05-22 ENCOUNTER — Ambulatory Visit
Admission: RE | Admit: 2016-05-22 | Discharge: 2016-05-22 | Disposition: A | Discharge status: Home / Self Care | Payer: 59 | Source: Ambulatory Visit | Attending: Radiation Oncology | Admitting: Radiation Oncology

## 2016-05-22 ENCOUNTER — Ambulatory Visit: Payer: 59

## 2016-05-22 VITALS — BP 114/79 | HR 70 | Temp 98.0°F | Resp 16 | Wt 283.2 lb

## 2016-05-22 DIAGNOSIS — C8114 Nodular sclerosis classical Hodgkin lymphoma, lymph nodes of axilla and upper limb: Secondary | ICD-10-CM

## 2016-05-22 DIAGNOSIS — Z510 Encounter for antineoplastic radiation therapy: Secondary | ICD-10-CM | POA: Diagnosis not present

## 2016-05-22 MED ORDER — RADIAPLEXRX EX GEL
Freq: Once | CUTANEOUS | Status: AC
  Administered 2016-05-22: 12:00:00 via TOPICAL

## 2016-05-22 NOTE — Progress Notes (Signed)
   Weekly Management Note:  Outpatient    ICD-9-CM ICD-10-CM   1. 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  CBCT/MVCT images/Port film x-rays were reviewed.  The chart was checked. Patient denies pain. Per nursing, no evidence of lymphedema, or skin changes. Patient reports continued use of Radiaplex.  Physical Findings:  weight is 283 lb 3.2 oz (128.5 kg). Her oral temperature is 98 F (36.7 C). Her blood pressure is 114/79 and her pulse is 70. Her respiration is 16 and oxygen saturation is 100%.   Wt Readings from Last 3 Encounters:  05/22/16 283 lb 3.2 oz (128.5 kg)  05/08/16 257 lb 6.4 oz (116.8 kg)  05/08/16 256 lb (116.1 kg)   No skin irritation in left axilla. Slight erythema in the left axillary region with no skin breakdown.  Impression:  The patient is tolerating radiotherapy.  Plan:  Continue radiotherapy as planned. Patient instructed to apply Radiplex to intact skin in treatment fields. Patient was given another tube of Radiaplex as well as a one month follow-up card.     ________________________________   This document serves as a record of services personally performed by Gery Pray, MD. It was created on his behalf by Bethann Humble, a trained medical scribe. The creation of this record is based on the scribe's personal observations and the provider's statements to them. This document has been checked and approved by the attending provider.

## 2016-05-22 NOTE — Progress Notes (Signed)
Weight and vitals stable. Denies pain. No evidence of lymphedema of left arm noted. No skin changes noted within treatment field of left axilla. Reports using radiaplex as directed. Understands to continue use of radiaplex bid for the next two weeks. Provided patient with additional tube of radiaplex and a one month follow up appointment card.

## 2016-05-23 ENCOUNTER — Ambulatory Visit
Admission: RE | Admit: 2016-05-23 | Discharge: 2016-05-23 | Disposition: A | Discharge status: Home / Self Care | Payer: 59 | Source: Ambulatory Visit | Attending: Radiation Oncology | Admitting: Radiation Oncology

## 2016-05-23 DIAGNOSIS — Z510 Encounter for antineoplastic radiation therapy: Secondary | ICD-10-CM | POA: Diagnosis not present

## 2016-05-23 DIAGNOSIS — C8114 Nodular sclerosis classical Hodgkin lymphoma, lymph nodes of axilla and upper limb: Secondary | ICD-10-CM | POA: Diagnosis not present

## 2016-05-26 ENCOUNTER — Other Ambulatory Visit (HOSPITAL_COMMUNITY): Payer: 59

## 2016-05-26 ENCOUNTER — Encounter (HOSPITAL_COMMUNITY): Payer: 59 | Attending: Adult Health

## 2016-05-26 DIAGNOSIS — C8118 Nodular sclerosis classical Hodgkin lymphoma, lymph nodes of multiple sites: Secondary | ICD-10-CM | POA: Insufficient documentation

## 2016-05-26 DIAGNOSIS — E876 Hypokalemia: Secondary | ICD-10-CM | POA: Insufficient documentation

## 2016-05-26 LAB — COMPREHENSIVE METABOLIC PANEL
ALBUMIN: 4.1 g/dL (ref 3.5–5.0)
ALK PHOS: 42 U/L (ref 38–126)
ALT: 39 U/L (ref 14–54)
ANION GAP: 8 (ref 5–15)
AST: 37 U/L (ref 15–41)
BUN: 21 mg/dL — ABNORMAL HIGH (ref 6–20)
CO2: 27 mmol/L (ref 22–32)
CREATININE: 0.85 mg/dL (ref 0.44–1.00)
Calcium: 10.2 mg/dL (ref 8.9–10.3)
Chloride: 104 mmol/L (ref 101–111)
GFR calc non Af Amer: >60 mL/min (ref >60)
Glucose, Bld: 115 mg/dL — ABNORMAL HIGH (ref 65–99)
POTASSIUM: 4.3 mmol/L (ref 3.5–5.1)
SODIUM: 139 mmol/L (ref 135–145)
Total Bilirubin: 0.4 mg/dL (ref 0.3–1.2)
Total Protein: 7.5 g/dL (ref 6.5–8.1)

## 2016-05-31 ENCOUNTER — Encounter: Payer: Self-pay | Admitting: Radiation Oncology

## 2016-05-31 NOTE — Progress Notes (Signed)
  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  Beams/energy:   3D  //  15X  Narrative: The patient tolerated radiation treatment relatively well. She denied experiencing pain or fatigue throughout treatment. She experienced some mild radiation related skin changes including slight erythema in the left axillary region without skin breakdown.  Plan: The patient has completed radiation treatment. She will continue to apply Radiaplex to intact skin within the treatment fields. The patient will return to radiation oncology clinic for routine followup in one month. I advised them to call or return sooner if they have any questions or concerns related to their recovery or treatment.  -----------------------------------  Eppie Gibson, MD  This document serves as a record of services personally performed by Eppie Gibson, MD. It was created on her behalf by Maryla Morrow, a trained medical scribe. The creation of this record is based on the scribe's personal observations and the provider's statements to them. This document has been checked and approved by the attending provider.

## 2016-06-20 ENCOUNTER — Encounter: Payer: Self-pay | Admitting: *Deleted

## 2016-06-23 ENCOUNTER — Encounter: Payer: Self-pay | Admitting: Radiation Oncology

## 2016-06-23 ENCOUNTER — Ambulatory Visit
Admission: RE | Admit: 2016-06-23 | Discharge: 2016-06-23 | Disposition: A | Discharge status: Home / Self Care | Payer: 59 | Source: Ambulatory Visit | Attending: Radiation Oncology | Admitting: Radiation Oncology

## 2016-06-23 DIAGNOSIS — Z923 Personal history of irradiation: Secondary | ICD-10-CM | POA: Diagnosis not present

## 2016-06-23 DIAGNOSIS — Z888 Allergy status to other drugs, medicaments and biological substances status: Secondary | ICD-10-CM | POA: Diagnosis not present

## 2016-06-23 DIAGNOSIS — Z79899 Other long term (current) drug therapy: Secondary | ICD-10-CM | POA: Diagnosis not present

## 2016-06-23 DIAGNOSIS — Y842 Radiological procedure and radiotherapy as the cause of abnormal reaction of the patient, or of later complication, without mention of misadventure at the time of the procedure: Secondary | ICD-10-CM | POA: Insufficient documentation

## 2016-06-23 DIAGNOSIS — Z880 Allergy status to penicillin: Secondary | ICD-10-CM | POA: Insufficient documentation

## 2016-06-23 DIAGNOSIS — Z7901 Long term (current) use of anticoagulants: Secondary | ICD-10-CM | POA: Diagnosis not present

## 2016-06-23 DIAGNOSIS — C8114 Nodular sclerosis classical Hodgkin lymphoma, lymph nodes of axilla and upper limb: Secondary | ICD-10-CM

## 2016-06-23 NOTE — Progress Notes (Signed)
Holly Stephens presents for follow up of radiation completed 05/23/16 to her Left Axilla. She denies pain, but did have soreness to her upper Left chest several days ago which has resolved. She has fatigue, but it has been improving slowly since radiation. The skin to her radiation site is intact. She is still radiaplex to this area. She will switch to a vitamin E cream when the radiaplex tube is completed. She is scheduled to have a PET scan in June and will see Dr. Sheldon Silvan afterwards.   BP (!) 113/56   Pulse 74   Temp 98.5 F (36.9 C)   Ht 5\' 1"  (1.549 m)   Wt 265 lb (120.2 kg)   SpO2 99% Comment: room air  BMI 50.07 kg/m    Wt Readings from Last 3 Encounters:  06/23/16 265 lb (120.2 kg)  05/22/16 283 lb 3.2 oz (128.5 kg)  05/08/16 257 lb 6.4 oz (116.8 kg)

## 2016-06-23 NOTE — Progress Notes (Signed)
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  CHIEF COMPLAINT: Here for follow-up and surveillance of hodgkin lymphoma  Narrative:  The patient returns today for routine follow-up. She currently denies pain, but did report soreness of her upper left chest a few days ago that has resolved. She is fatigued and it is improving slowly since radiation. The nurse notes skin to her radiation site is intact. She is still applying radiaplex to this area.  ALLERGIES:  is allergic to augmentin [amoxicillin-pot clavulanate] and bleomycin.  Meds: Current Outpatient Prescriptions  Medication Sig Dispense Refill  . cholecalciferol (VITAMIN D) 1000 units tablet Take 2,000 Units by mouth daily.    . dolutegravir (TIVICAY) 50 MG tablet Take 1 tablet (50 mg total) by mouth daily. 90 tablet 1  . emollient (RADIAGEL) gel Apply topically as needed for wound care.    Marland Kitchen emtricitabine-rilpivir-tenofovir AF (ODEFSEY) 200-25-25 MG TABS tablet Take 1 tablet by mouth daily. 90 tablet 1  . fenofibrate 160 MG tablet Take 160 mg by mouth daily.     . furosemide (LASIX) 40 MG tablet Take 0.5 tablets (20 mg total) by mouth daily. 45 tablet 1  . Garlic 6295 MG CAPS Take 1,000 mg by mouth daily.     Marland Kitchen JANUMET 50-500 MG per tablet Take 1 tablet by mouth 2 (two) times daily with a meal.     . lidocaine-prilocaine (EMLA) cream Apply 1 application topically as needed (prior to accessing port).    . metoprolol tartrate (LOPRESSOR) 25 MG tablet Take 1 tablet (25 mg  total) by mouth daily. 30 tablet 0  . Multiple Vitamin (MULTIVITAMIN WITH MINERALS) TABS tablet Take 1 tablet by mouth daily.    . rivaroxaban (XARELTO) 20 MG TABS tablet Take 1 tablet (20 mg total) by mouth daily with supper. 30 tablet 2  . spironolactone (ALDACTONE) 25 MG tablet Take 25 mg by mouth daily.    . potassium chloride SA (K-DUR,KLOR-CON) 20 MEQ tablet Take 2 tablets (40 mEq total) by mouth 2 (two) times daily. (Patient not taking: Reported on 06/23/2016) 60 tablet 3  . spironolactone (ALDACTONE) 25 MG tablet Take 1 tablet (25 mg total) by mouth daily. 90 tablet 3   No current facility-administered medications for this encounter.     Physical Findings: The patient is in no acute distress. Patient is alert and oriented.  height is 5\' 1"  (1.549 m) and weight is 265 lb (120.2 kg). Her temperature is 98.5 F (36.9 C). Her blood pressure is 113/56 (abnormal) and her pulse is 74. Her oxygen saturation is 99%. .    The left axillary region has no residual skin irritation or palpable masses.  Lab Findings: Lab Results  Component Value Date   WBC 7.0 05/08/2016   HGB 12.9 05/08/2016   HCT 39.9 05/08/2016   MCV 89.7 05/08/2016   PLT 295 05/08/2016    Radiographic Findings: No results found.  Impression/Plan: Recovering well from the effects of radiation  She will continue f/u with  med/onc and I will see her on a PRN basis. I encouraged her to continue using the cream she was given and switch to vitamin E cream if she has continued dryness in that region in the future. She agreed with this plan. She is scheduled to have a PET scan in June and will see Robynn Pane, PA-C afterwards.    _____________________________________   Eppie Gibson, MD  This document serves as a record of services personally performed by Eppie Gibson, MD. It was created on her behalf by Darcus Austin, a trained medical scribe. The creation of this record is based on the scribe's personal observations and  the provider's statements to them. This document has been checked and approved by the attending provider.

## 2016-06-30 ENCOUNTER — Ambulatory Visit (HOSPITAL_COMMUNITY): Payer: 59

## 2016-07-04 ENCOUNTER — Encounter (HOSPITAL_COMMUNITY): Payer: Self-pay

## 2016-07-04 ENCOUNTER — Other Ambulatory Visit (HOSPITAL_COMMUNITY): Payer: Self-pay | Admitting: Oncology

## 2016-07-04 ENCOUNTER — Encounter (HOSPITAL_COMMUNITY): Payer: 59 | Attending: Hematology & Oncology

## 2016-07-04 ENCOUNTER — Ambulatory Visit (HOSPITAL_COMMUNITY): Payer: 59 | Admitting: Oncology

## 2016-07-04 DIAGNOSIS — C8114 Nodular sclerosis classical Hodgkin lymphoma, lymph nodes of axilla and upper limb: Secondary | ICD-10-CM

## 2016-07-04 DIAGNOSIS — C8118 Nodular sclerosis classical Hodgkin lymphoma, lymph nodes of multiple sites: Secondary | ICD-10-CM | POA: Insufficient documentation

## 2016-07-04 LAB — CBC WITH DIFFERENTIAL/PLATELET
BASOS PCT: 0 %
Basophils Absolute: 0 10*3/uL (ref 0.0–0.1)
EOS PCT: 3 %
Eosinophils Absolute: 0.2 10*3/uL (ref 0.0–0.7)
HCT: 40.5 % (ref 36.0–46.0)
Hemoglobin: 13.2 g/dL (ref 12.0–15.0)
Lymphocytes Relative: 27 %
Lymphs Abs: 2 10*3/uL (ref 0.7–4.0)
MCH: 29.3 pg (ref 26.0–34.0)
MCHC: 32.6 g/dL (ref 30.0–36.0)
MCV: 90 fL (ref 78.0–100.0)
MONOS PCT: 7 %
Monocytes Absolute: 0.5 10*3/uL (ref 0.1–1.0)
Neutro Abs: 4.7 10*3/uL (ref 1.7–7.7)
Neutrophils Relative %: 63 %
PLATELETS: 260 10*3/uL (ref 150–400)
RBC: 4.5 MIL/uL (ref 3.87–5.11)
RDW: 15 % (ref 11.5–15.5)
WBC: 7.4 10*3/uL (ref 4.0–10.5)

## 2016-07-04 LAB — COMPREHENSIVE METABOLIC PANEL
ALBUMIN: 3.9 g/dL (ref 3.5–5.0)
ALT: 47 U/L (ref 14–54)
ANION GAP: 9 (ref 5–15)
AST: 44 U/L — ABNORMAL HIGH (ref 15–41)
Alkaline Phosphatase: 38 U/L (ref 38–126)
BILIRUBIN TOTAL: 0.5 mg/dL (ref 0.3–1.2)
BUN: 16 mg/dL (ref 6–20)
CO2: 30 mmol/L (ref 22–32)
Calcium: 9.9 mg/dL (ref 8.9–10.3)
Chloride: 100 mmol/L — ABNORMAL LOW (ref 101–111)
Creatinine, Ser: 0.73 mg/dL (ref 0.44–1.00)
GFR calc Af Amer: >60 mL/min (ref >60)
GFR calc non Af Amer: >60 mL/min (ref >60)
GLUCOSE: 157 mg/dL — AB (ref 65–99)
POTASSIUM: 3.6 mmol/L (ref 3.5–5.1)
Sodium: 139 mmol/L (ref 135–145)
TOTAL PROTEIN: 6.9 g/dL (ref 6.5–8.1)

## 2016-07-04 LAB — LACTATE DEHYDROGENASE: LDH: 130 U/L (ref 98–192)

## 2016-07-04 MED ORDER — HEPARIN SOD (PORK) LOCK FLUSH 100 UNIT/ML IV SOLN
500.0000 [IU] | Freq: Once | INTRAVENOUS | Status: AC
  Administered 2016-07-04: 500 [IU] via INTRAVENOUS

## 2016-07-04 MED ORDER — SODIUM CHLORIDE 0.9% FLUSH
20.0000 mL | INTRAVENOUS | Status: DC | PRN
  Administered 2016-07-04: 20 mL via INTRAVENOUS
  Filled 2016-07-04: qty 20

## 2016-07-04 MED ORDER — HEPARIN SOD (PORK) LOCK FLUSH 100 UNIT/ML IV SOLN
INTRAVENOUS | Status: AC
  Filled 2016-07-04: qty 5

## 2016-07-04 NOTE — Patient Instructions (Signed)
[facility] at [facility] Discharge Instructions  RECOMMENDATIONS MADE BY THE CONSULTANT AND ANY TEST RESULTS WILL BE SENT TO YOUR REFERRING PHYSICIAN.  Port flush with labs  Thank you for choosing [facility] at [facility] to provide your oncology and hematology care.  To afford each patient quality time with our provider, please arrive at least 15 minutes before your scheduled appointment time.    If you have a lab appointment with the Cancer Center please come in thru the  Main Entrance and check in at the main information desk  You need to re-schedule your appointment should you arrive 10 or more minutes late.  We strive to give you quality time with our providers, and arriving late affects you and other patients whose appointments are after yours.  Also, if you no show three or more times for appointments you may be dismissed from the clinic at the providers discretion.     Again, thank you for choosing [facility].  Our hope is that these requests will decrease the amount of time that you wait before being seen by our physicians.       _____________________________________________________________  Should you have questions after your visit to [facility], please contact our office at [phone] between the hours of 8:30 a.m. and 4:30 p.m.  Voicemails left after 4:30 p.m. will not be returned until the following business day.  For prescription refill requests, have your pharmacy contact our office.       Resources For Cancer Patients and their Caregivers ? American Cancer Society: Can assist with transportation, wigs, general needs, runs Look Good Feel Better.        1-888-227-6333 ? Cancer Care: Provides financial assistance, online support groups, medication/co-pay assistance.  1-800-813-HOPE (4673) ? [facility] Assists [place] cancer patients and their families through  emotional , educational and financial support.  336-427-4357 ? [place] DSS Where to apply for food stamps, Medicaid and utility assistance. 336-342-1394 ? [facility]: Transportation to medical appointments. 336-347-2287 ? Social Security Administration: May apply for disability if have a Stage IV cancer. 336-342-7796 1-800-772-1213 ? [place] Aging, Disability and Transit Services: Assists with nutrition, care and transit needs. 336-349-2343  Cancer Center Support Programs: @[email or web address]@ > Cancer Support Group  2nd Tuesday of the month 1pm-2pm, Journey Room  > Creative Journey  3rd Tuesday of the month 1130am-1pm, Journey Room  > Look Good Feel Better  1st Wednesday of the month 10am-12 noon, Journey Room (Call American [employer] to register 1-800-395-5775)    

## 2016-07-04 NOTE — Progress Notes (Signed)
Holly Stephens presented for Portacath access and flush. Proper placement of portacath confirmed by CXR. Portacath located right chest wall accessed with  H 20 needle. Good blood return present.  Specimen drawn for labs.  Portacath flushed with 62ml NS and 500U/10ml Heparin and needle removed intact. Procedure without incident. Patient tolerated procedure well.

## 2016-07-10 ENCOUNTER — Telehealth (HOSPITAL_COMMUNITY): Payer: Self-pay | Admitting: Vascular Surgery

## 2016-07-10 NOTE — Telephone Encounter (Signed)
Left pt message to make f/u brst echo

## 2016-07-12 DIAGNOSIS — J0100 Acute maxillary sinusitis, unspecified: Secondary | ICD-10-CM | POA: Diagnosis not present

## 2016-07-12 DIAGNOSIS — H10029 Other mucopurulent conjunctivitis, unspecified eye: Secondary | ICD-10-CM | POA: Diagnosis not present

## 2016-07-24 ENCOUNTER — Ambulatory Visit (HOSPITAL_COMMUNITY)
Admission: RE | Admit: 2016-07-24 | Discharge: 2016-07-24 | Disposition: A | Discharge status: Home / Self Care | Payer: 59 | Source: Ambulatory Visit | Attending: Adult Health | Admitting: Adult Health

## 2016-07-24 DIAGNOSIS — C8590 Non-Hodgkin lymphoma, unspecified, unspecified site: Secondary | ICD-10-CM | POA: Diagnosis not present

## 2016-07-24 DIAGNOSIS — C8118 Nodular sclerosis classical Hodgkin lymphoma, lymph nodes of multiple sites: Secondary | ICD-10-CM | POA: Diagnosis not present

## 2016-07-24 DIAGNOSIS — I700 Atherosclerosis of aorta: Secondary | ICD-10-CM | POA: Insufficient documentation

## 2016-07-24 LAB — GLUCOSE, CAPILLARY: Glucose-Capillary: 124 mg/dL — ABNORMAL HIGH (ref 65–99)

## 2016-07-24 MED ORDER — FLUDEOXYGLUCOSE F - 18 (FDG) INJECTION
13.0000 | Freq: Once | INTRAVENOUS | Status: AC | PRN
  Administered 2016-07-24: 13 via INTRAVENOUS

## 2016-07-26 ENCOUNTER — Ambulatory Visit (HOSPITAL_COMMUNITY): Payer: 59

## 2016-07-26 ENCOUNTER — Telehealth (HOSPITAL_COMMUNITY): Payer: Self-pay

## 2016-07-26 ENCOUNTER — Encounter (HOSPITAL_COMMUNITY): Payer: 59 | Attending: Hematology & Oncology | Admitting: Oncology

## 2016-07-26 ENCOUNTER — Ambulatory Visit (HOSPITAL_COMMUNITY)
Admission: RE | Admit: 2016-07-26 | Discharge: 2016-07-26 | Disposition: A | Discharge status: Home / Self Care | Payer: 59 | Source: Ambulatory Visit | Attending: Oncology | Admitting: Oncology

## 2016-07-26 ENCOUNTER — Encounter (HOSPITAL_COMMUNITY): Payer: Self-pay

## 2016-07-26 VITALS — BP 118/63 | HR 72 | Temp 98.1°F | Resp 18 | Wt 267.6 lb

## 2016-07-26 DIAGNOSIS — C8118 Nodular sclerosis classical Hodgkin lymphoma, lymph nodes of multiple sites: Secondary | ICD-10-CM | POA: Insufficient documentation

## 2016-07-26 DIAGNOSIS — I824Z2 Acute embolism and thrombosis of unspecified deep veins of left distal lower extremity: Secondary | ICD-10-CM | POA: Insufficient documentation

## 2016-07-26 DIAGNOSIS — C8110 Nodular sclerosis classical Hodgkin lymphoma, unspecified site: Secondary | ICD-10-CM | POA: Diagnosis present

## 2016-07-26 DIAGNOSIS — I509 Heart failure, unspecified: Secondary | ICD-10-CM

## 2016-07-26 DIAGNOSIS — C8190 Hodgkin lymphoma, unspecified, unspecified site: Secondary | ICD-10-CM | POA: Diagnosis not present

## 2016-07-26 DIAGNOSIS — Z86718 Personal history of other venous thrombosis and embolism: Secondary | ICD-10-CM | POA: Diagnosis not present

## 2016-07-26 DIAGNOSIS — I82442 Acute embolism and thrombosis of left tibial vein: Secondary | ICD-10-CM | POA: Insufficient documentation

## 2016-07-26 DIAGNOSIS — Z7901 Long term (current) use of anticoagulants: Secondary | ICD-10-CM | POA: Diagnosis not present

## 2016-07-26 DIAGNOSIS — I82432 Acute embolism and thrombosis of left popliteal vein: Secondary | ICD-10-CM

## 2016-07-26 DIAGNOSIS — I429 Cardiomyopathy, unspecified: Secondary | ICD-10-CM

## 2016-07-26 MED ORDER — RIVAROXABAN 20 MG PO TABS: 20.0000 mg | ORAL_TABLET | Freq: Every day | ORAL | 2 refills | Status: DC

## 2016-07-26 NOTE — Patient Instructions (Signed)
[facility] at [facility] Discharge Instructions  RECOMMENDATIONS MADE BY THE CONSULTANT AND ANY TEST RESULTS WILL BE SENT TO YOUR REFERRING PHYSICIAN.  You saw Dr. Zhou today.  Thank you for choosing [facility] at [facility] to provide your oncology and hematology care.  To afford each patient quality time with our provider, please arrive at least 15 minutes before your scheduled appointment time.    If you have a lab appointment with the Cancer Center please come in thru the  Main Entrance and check in at the main information desk  You need to re-schedule your appointment should you arrive 10 or more minutes late.  We strive to give you quality time with our providers, and arriving late affects you and other patients whose appointments are after yours.  Also, if you no show three or more times for appointments you may be dismissed from the clinic at the providers discretion.     Again, thank you for choosing [facility].  Our hope is that these requests will decrease the amount of time that you wait before being seen by our physicians.       _____________________________________________________________  Should you have questions after your visit to [facility], please contact our office at [phone] between the hours of 8:30 a.m. and 4:30 p.m.  Voicemails left after 4:30 p.m. will not be returned until the following business day.  For prescription refill requests, have your pharmacy contact our office.       Resources For Cancer Patients and their Caregivers ? American Cancer Society: Can assist with transportation, wigs, general needs, runs Look Good Feel Better.        1-888-227-6333 ? Cancer Care: Provides financial assistance, online support groups, medication/co-pay assistance.  1-800-813-HOPE (4673) ? [facility] Assists [place] cancer patients and their families through  emotional , educational and financial support.  336-427-4357 ? [place] DSS Where to apply for food stamps, Medicaid and utility assistance. 336-342-1394 ? [facility]: Transportation to medical appointments. 336-347-2287 ? Social Security Administration: May apply for disability if have a Stage IV cancer. 336-342-7796 1-800-772-1213 ? [place] Aging, Disability and Transit Services: Assists with nutrition, care and transit needs. 336-349-2343  Cancer Center Support Programs: @[email or web address]@ > Cancer Support Group  2nd Tuesday of the month 1pm-2pm, Journey Room  > Creative Journey  3rd Tuesday of the month 1130am-1pm, Journey Room  > Look Good Feel Better  1st Wednesday of the month 10am-12 noon, Journey Room (Call American Cancer [employer] to register 1-800-395-5775)    

## 2016-07-26 NOTE — Progress Notes (Signed)
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  Positive CD30, CD15, andPAX-5.  Negative CD45 and CD20.      08/31/2015 PET scan    Hypermetabolic lymphadenopathy. Nodal stations include: L supraclavicular nodes, L axillary nodes, mediastinal lymph nodes, periaortic retroperitoneal and periportal lymph nodes as well as L iliac and inguinal lymph nodes. Two concerning pulm nodules...      09/01/2015 Imaging    MUGA- Normal LEFT ventricular ejection fraction of 73%.  Normal LEFT ventricular wall motion.      09/01/2015 Procedure    PFTs-  Spirometry shows mild ventilatory defect without airflow obstruction and no bronchodilator improvement. Lung volumes are normal. Airway resistance is normal. DLCO normal. Essentially normal lung function      09/08/2015 Procedure    Port placed by Dr. Rosana Hoes      09/13/2015 - 02/23/2016 Chemotherapy    ABVD      09/27/2015 Imaging    CT angio of chest- no gross PE seen. Improving axillary, mediastinal and upper abdominal adenopathy. Improved and possibly resolved RUL nodule and with resolved or obscured LLL nodule. Small L pleural effusion and mild L lower lobe atelectasis.      10/11/2015 Treatment Plan Change    Bleomycin discontinued       11/01/2015 PET scan    Clear interval response to therapy with decrease in size and hypermetabolism of larger lymph nodes seen  previously and some of the smaller hypermetabolic lymph node seen on the previous study have resolved completely in terms of abnormal enlargement and hypermetabolism. 2. Slight decrease in left lower lobe pulmonary nodule with interval resolution of the right upper lobe pulmonary nodule. 3. Stable appearance of mild heterogeneous FDG accumulation within the marrow space.      11/01/2015 Miscellaneous    Deauville 3- 4 more cycles of AVD recommended according to NCCN guidelines.      02/09/2016 Imaging    [place] left leg- Positive for acute DVT in left calf, popliteal and femoral veins.      03/22/2016 PET scan    1. Mild residual metabolic activity within LEFT axillary lymph nodes ( Deauville 2 ). 2. No additional hypermetabolic adenopathy identified. 3. Normal spleen and marrow activity.      04/25/2016 - 05/23/2016 Radiation Therapy    Residual disease treated with radiation Isidore Moos): Left axilla treated with 36 Gy in 20 fractions.      07/24/2016 PET scan    PET IMPRESSION: 1. Stable exam. The mild residual low level metabolic activity within left axillary lymph nodes a similar to prior (Deauville 2). 2. No new or progressive findings. 3. Abdominal aortic atherosclerosis.         HISTORY OF PRESENT ILLNESS:  (From [name], PA-C's last note on 03/27/16)     INTERVAL HISTORY:  Holly Stephens 61 y.o. female returns for follow-up for nodular-sclerosing Hodgkin's lymphoma.  She states that  she has been doing well. A few weeks ago she did have a bilateral eye infection for which she went to see her PCP and received eye drop antibiotics with improvement in her symptoms. She denies any drenching night sweats, weight loss, or fevers/chills.    REVIEW OF SYSTEMS:  Review of Systems  Constitutional: Positive for fatigue. Negative for chills and fever.  HENT:  Negative.  Negative for nosebleeds.   Eyes: Negative.   Respiratory: Negative.  Negative for cough and shortness of breath.     Cardiovascular: Negative.  Negative for chest pain and leg swelling.  Gastrointestinal: Negative.  Negative for abdominal pain, blood in stool, constipation, diarrhea, nausea and vomiting.  Endocrine: Negative.   Genitourinary: Negative.  Negative for dysuria and hematuria.   Musculoskeletal: Negative.  Negative for arthralgias.  Skin: Negative.   Neurological: Negative.  Negative for dizziness and headaches.  Hematological: Negative.  Negative for adenopathy. Does not bruise/bleed easily.  Psychiatric/Behavioral: Negative.  Negative for depression and sleep disturbance. The patient is not nervous/anxious.      PAST MEDICAL/SURGICAL HISTORY:  Past Medical History:  Diagnosis Date  . Acute deep vein thrombosis (DVT) of popliteal vein of left lower extremity ([place]) 02/09/2016  . Arthritis    osteoarthritis  . Diabetes mellitus without complication ([place])   . History of radiation therapy 04/25/2016 - 05/23/2016   Left Axilla treated to 36 Gy in 20 fractions  . HIV infection ([place])   . [name], nodular sclerosis ([place]) 08/23/2015  . Human immunodeficiency virus (HIV) disease ([place]) 02/23/2006   Qualifier: Diagnosis of  By: Megan Salon MD, John     Past Surgical History:  Procedure Laterality Date  . AXILLARY LYMPH NODE BIOPSY Left 07/23/2015   Procedure: EXCISIONAL BIOPSY LEFT AXILLARY LYMPH NODE;  Surgeon: [name], MD;  Location: [facility];  Service: General;  Laterality: Left;  . PORTACATH PLACEMENT Right 09/08/2015   Procedure: INSERTION OF CENTRAL VENOUS CATHETER WITH PORT FOR CHEMOTHERAPY;  Surgeon: [name], MD;  Location: [facility];  Service: General;  Laterality: Right;  . SHOULDER SURGERY Right    removal of bone spur     SOCIAL HISTORY:  Social History   Social History  . Marital status: Widowed    Spouse name: N/A  . Number of children: N/A  . Years of education: N/A   Occupational History  . Not on file.   Social History Main Topics  . Smoking status:  Never Smoker  . Smokeless tobacco: Never Used  . Alcohol use No  . Drug use: No  . Sexual activity: No     Comment: declined condoms   Other Topics Concern  . Not on file   Social History Narrative  . No narrative on file    FAMILY HISTORY:  Family History  Problem Relation Age of Onset  . Hypertension Mother   . Cancer Mother 53       breast   . Hypertension Father   . Heart attack Father   . Pulmonary embolism Sister   . Parkinson's disease Brother     CURRENT MEDICATIONS:  Outpatient Encounter Prescriptions as of 07/26/2016  Medication Sig  . cholecalciferol (VITAMIN D) 1000 units tablet Take 2,000 Units by mouth daily.  . dolutegravir (TIVICAY) 50 MG tablet Take 1 tablet (50 mg total) by mouth daily.  Marland Kitchen emollient (RADIAGEL) gel Apply topically as needed for wound care.  Marland Kitchen emtricitabine-rilpivir-tenofovir AF (ODEFSEY) 200-25-25 MG TABS tablet Take 1 tablet by mouth  daily.  . fenofibrate 160 MG tablet Take 160 mg by mouth daily.   . fexofenadine (ALLEGRA) 180 MG tablet Take 180 mg by mouth daily.  . furosemide (LASIX) 40 MG tablet Take 0.5 tablets (20 mg total) by mouth daily.  . Garlic 4709 MG CAPS Take 1,000 mg by mouth daily.   Marland Kitchen JANUMET 50-500 MG per tablet Take 1 tablet by mouth 2 (two) times daily with a meal.   . lidocaine-prilocaine (EMLA) cream Apply 1 application topically as needed (prior to accessing port).  . metoprolol tartrate (LOPRESSOR) 25 MG tablet Take 1 tablet (25 mg total) by mouth daily.  . Multiple Vitamin (MULTIVITAMIN WITH MINERALS) TABS tablet Take 1 tablet by mouth daily.  . potassium chloride SA (K-DUR,KLOR-CON) 20 MEQ tablet Take 2 tablets (40 mEq total) by mouth 2 (two) times daily.  . rivaroxaban (XARELTO) 20 MG TABS tablet Take 1 tablet (20 mg total) by mouth daily with supper.  Marland Kitchen spironolactone (ALDACTONE) 25 MG tablet Take 25 mg by mouth daily.  Marland Kitchen UNABLE TO FIND Med Name: Theracurmin 60 mg po daily  . [DISCONTINUED] spironolactone  (ALDACTONE) 25 MG tablet Take 1 tablet (25 mg total) by mouth daily.   No facility-administered encounter medications on file as of 07/26/2016.     ALLERGIES:  Allergies  Allergen Reactions  . Augmentin [Amoxicillin-Pot Clavulanate] Hives and Other (See Comments)    Has patient had a PCN reaction causing immediate rash, facial/tongue/throat swelling, SOB or lightheadedness with hypotension: No Has patient had a PCN reaction causing severe rash involving mucus membranes or skin necrosis: No Has patient had a PCN reaction that required hospitalization No Has patient had a PCN reaction occurring within the last 10 years: No If all of the above answers are "NO", then may proceed with Cephalosporin use.  . Bleomycin Cough     PHYSICAL EXAM:  ECOG Performance status: 1 - Symptomatic, but largely independent   Vitals:   07/26/16 1013  BP: 118/63  Pulse: 72  Resp: 18  Temp: 98.1 F (36.7 C)   Filed Weights   07/26/16 1013  Weight: 267 lb 9.6 oz (121.4 kg)    Physical Exam  Constitutional: She is oriented to person, place, and time and well-developed, well-nourished, and in no distress. No distress.  HENT:  Head: Normocephalic and atraumatic.  Mouth/Throat: Oropharynx is clear and moist. No oropharyngeal exudate.  Eyes: Conjunctivae are normal. Pupils are equal, round, and reactive to light. No scleral icterus.  Neck: Normal range of motion. Neck supple. No JVD present.  Cardiovascular: Normal rate, regular rhythm and normal heart sounds.  Exam reveals no gallop and no friction rub.   No murmur heard. Pulmonary/Chest: Effort normal and breath sounds normal. No respiratory distress. She has no wheezes. She has no rales.  Abdominal: Soft. Bowel sounds are normal. She exhibits no distension. There is no tenderness. There is no rebound and no guarding.  Musculoskeletal: Normal range of motion. She exhibits no edema or tenderness.  Lymphadenopathy:    She has no cervical adenopathy.     She has no axillary adenopathy.       Right: No supraclavicular adenopathy present.       Left: No supraclavicular adenopathy present.  (  Neurological: She is alert and oriented to person, place, and time. No cranial nerve deficit. Gait normal.  Skin: Skin is warm and dry. No rash noted. No erythema. No pallor.  Psychiatric: Mood, memory, affect and judgment normal.  Nursing note and vitals  reviewed.    LABORATORY DATA:  I have reviewed the labs as listed.  CBC    Component Value Date/Time   WBC 7.4 07/04/2016 0933   RBC 4.50 07/04/2016 0933   HGB 13.2 07/04/2016 0933   HCT 40.5 07/04/2016 0933   PLT 260 07/04/2016 0933   MCV 90.0 07/04/2016 0933   MCH 29.3 07/04/2016 0933   MCHC 32.6 07/04/2016 0933   RDW 15.0 07/04/2016 0933   LYMPHSABS 2.0 07/04/2016 0933   MONOABS 0.5 07/04/2016 0933   EOSABS 0.2 07/04/2016 0933   BASOSABS 0.0 07/04/2016 0933   CMP Latest Ref Rng & Units 07/04/2016 05/26/2016 05/08/2016  Glucose 65 - 99 mg/dL 157(H) 115(H) 160(H)  BUN 6 - 20 mg/dL 16 21(H) 16  Creatinine 0.44 - 1.00 mg/dL 0.73 0.85 0.74  Sodium 135 - 145 mmol/L 139 139 134(L)  Potassium 3.5 - 5.1 mmol/L 3.6 4.3 3.1(L)  Chloride 101 - 111 mmol/L 100(L) 104 100(L)  CO2 22 - 32 mmol/L _0 Calcium 8.9 - 10.3 mg/dL 9.9 10.2 9.5  Total Protein 6.5 - 8.1 g/dL 6.9 7.5 7.1  Total Bilirubin 0.3 - 1.2 mg/dL 0.5 0.4 0.1(L)  Alkaline Phos 38 - 126 U/L 38 42 41  AST 15 - 41 U/L 44(H) 37 27  ALT 14 - 54 U/L 47 39 27    PENDING LABS:    DIAGNOSTIC IMAGING:  Most recent PET scan: 03/22/16     PATHOLOGY:  (L) axillary biopsy: 07/23/15    ASSESSMENT & PLAN:   Hodgkin's Lymphoma, nodular-sclerosing type involving multiple lymph nodes:  -CD30, CD15, & PAX-5 positive; s/p ABVD chemotherapy beginning on 09/13/15, with discontinuation of Bleomycin on 10/11/15.  Mid-course PET scan revealed Deauville 3 lymphadenopathy, so she received 4 additional cycles of AVD.  Chemo course was complicated  by diastolic heart failure (followed by Dr. Haroldine Laws), in addition to HIV (followed by Holly Stephens with ID). Chemotherapy completed on 02/23/16. Post-treatment PET revealed mild residual (L) axillary adenopathy (Deauville 2), so she was referred for axillary radiation therapy.  -Completed XRT to left axilla on 05/23/16.  -Repeat PET demonstrates persistent activity in left axilla which is unchanged; however it is Deauville 2. Clinically there are no palpable lymph nodes in the left axilla. - Will plan to repeat restaging CT C/A/P at 6 month mark after completing treatment which will be in October 2018.   NCCN guidelines for surveillance of Hodgkin's Lymphoma (Age >/= 43) (1.2017):  A. Complete remission should be documented including reversion of PET to "negative" within 3 months following completion of therapy.  B. It is recommended that the patient be provided with a treatment summary at the completion of his/her therapy, including details of radiation therapy, organs at risk, and cumulative anthracycline dosage given.  C. Follow-up with an oncologist is recommended, especially during the first 5 years after treatment to detect recurrence, and then annually due to the risk of late complications including second cancers and cardiovascular disease. Late relapse or transformation to large cell lymphoma may occur in [place].  D. Frequency and types of tests may vary depending on clinical circumstances: age and stage at diagnosis, social habits, treatment modality, etc. There are few data to support specific recommendations; these represent the range of practice at [employer].   1. Follow-up after completion of treatment up to 5 years:   A. Interim H and P: Every 3-6 months for 1-2 years, then every 6-12 months until year 3, then annually.  B. Annual influenza vaccine.   C. Laboratory studies:    1. CBC, platelets, ESR (if elevated at time of initial diagnosis), chemistry profile as  clinically indicated.    2. Thyroid stimulating hormone (TSH) at least annually if radiation therapy to neck.   D. Acceptable to obtain a neck/chest/abdomen/pelvis CT scan with contrast, at 6, 12, and 24 month following completion of therapy, or as clinically indicated. PET/CT only if last PET was Deauville 4-5, to confirm complete response.   E. Counseling: Reproduction, health habits, psychosocial, cardiovascular, breast self-exam, skin cancer risk, end of treatment discussion.   F. Surveillance PET should not be done routinely due to risk of false positives. Management decision should not be based on PET scan alone; clinical or pathologic correlation is needed.   2. Follow-up and monitoring after 5 years:   A. Interim H&P: Annually    1. Annual blood pressure, aggressive management of cardiovascular risk factors.    2. Pneumococcal, meningococcal, and H-flu revaccination after 5-7 years, if patient treated with splenic radiation therapy or previous splenectomy (according to CBC recommendations).    3. Annual influenza vaccine.   B. Cardiovascular symptoms may emerge at a young age.    1. Consider stress test/echocardiogram at 10 year intervals after treatment is completed.    2. Consider carotid ultrasound at 10 year intervals if neck irradiation.   C. Laboratory studies:    1. CBC, platelets, chemistry profile annually    2. TSH at least annually if radiation therapy to neck    3. Biannual lipids    4. Annual fasting glucose   D. Annual breast screening: Initiate 8-10 years post therapy, or at age 75, whichever comes first, if chest or axillary radiation. The NCCN [name] Guidelines Panel recommends breast MRI in addition to mammography for women who've received irradiation to the chest between ages 60-30 years, which is consistent with the [place] (ACS) Guidelines. Consider referral to a breast specialist.   E. Perform other routine surveillance tests for cervical,  colorectal, endometrial, lung, and prostate cancer as per the ACS Cancer Screening Guidelines.   F. Counseling: Reproduction, health habits, psychosocial, cardiovascular, breast self-exam, and skin cancer risk.   G. Treatment summary and consideration of transfer to primary care provider.   H. Consider a referral to a survivorship clinic.    Left LE DVT:  -I have repeated patient's LLE Doppler venous US today since she has been on xarelto for over 6 months now. US demonstrated Nonocclusive thrombus within the peroneal and posterior tibial veins of the left calf. Previously seen DVT in the left femoral and popliteal veins has resolved. Therefore I will continue her on xarelto for 3 more months and repeat her Doppler venous US in 3 months to see if the nonocclusive thrombus resolves.  -Continue Xarelto. Tolerating well; no active bleeding episodes.  - refilled xarelto today.  HIV:  -Under excellent control per Infectious Diseases. Continue current anti-viral regimen.  -Maintain follow-up with Holly Stephens, as clinically indicated.     Dispo:  RTC in 3 months for follow up and exam and labs.  Orders Placed This Encounter  Procedures  . US Venous Img Lower Unilateral Left    Standing Status:   Future    Number of Occurrences:   1    Standing Expiration Date:   07/26/2017    Order Specific Question:   Reason for Exam (SYMPTOM  OR DIAGNOSIS REQUIRED)    Answer:   assess for resolution  of dvt    Order Specific Question:   Preferred imaging location?    Answer:   [facility]    Order Specific Question:   Call Results- Best Contact Number?    Answer:   [phone] Do not Hold  . US Venous Img Lower Unilateral Left    Standing Status:   Future    Standing Expiration Date:   07/26/2017    Order Specific Question:   Reason for Exam (SYMPTOM  OR DIAGNOSIS REQUIRED)    Answer:   LLE DVT    Order Specific Question:   Preferred imaging location?    Answer:   [facility]  . CBC  with Differential    Standing Status:   Future    Standing Expiration Date:   07/26/2017  . Comprehensive metabolic panel    Standing Status:   Future    Standing Expiration Date:   07/26/2017  . Lactate dehydrogenase    Standing Status:   Future    Standing Expiration Date:   07/26/2017    All questions were answered to patient's stated satisfaction. Encouraged patient to call with any new concerns or questions before her next visit to the cancer center and we can certain see her sooner, if needed.      Orders placed this encounter:  Orders Placed This Encounter  Procedures  . US Venous Img Lower Unilateral Left  . CBC with Differential  . Comprehensive metabolic panel  . Lactate dehydrogenase      Mike Craze, NP [place] [phone]

## 2016-07-26 NOTE — Telephone Encounter (Signed)
-----   Message from Twana First, MD sent at 07/26/2016 12:39 PM EDT ----- Sharyn Lull please call the patient and let her know that her LLE Doppler US showed that she still had a nonocclusive thrombus within the peroneal and posterior tibial veins of the left calf but Previously seen DVT in the left femoral and popliteal veins has resolved. So she needs to continue her xarelto for 3 more months. I have ordered a new Doppler US to be performed 1-2 days prior to her next visit in 3 months (amy can you get this scheduled?)   Ive refilled her xarelto

## 2016-07-26 NOTE — Telephone Encounter (Signed)
Called patient and left message explaining what the doppler US showed and that she needs to continue the Xarelto for 3 more months. A new refill for Xarelto was sent to her pharmacy. Call cancer center if any questions.

## 2016-08-10 DIAGNOSIS — E119 Type 2 diabetes mellitus without complications: Secondary | ICD-10-CM | POA: Diagnosis not present

## 2016-08-14 DIAGNOSIS — I10 Essential (primary) hypertension: Secondary | ICD-10-CM | POA: Diagnosis not present

## 2016-08-14 DIAGNOSIS — E119 Type 2 diabetes mellitus without complications: Secondary | ICD-10-CM | POA: Diagnosis not present

## 2016-08-22 ENCOUNTER — Other Ambulatory Visit: Payer: Self-pay | Admitting: Internal Medicine

## 2016-08-22 DIAGNOSIS — B20 Human immunodeficiency virus [HIV] disease: Secondary | ICD-10-CM

## 2016-08-29 ENCOUNTER — Encounter (HOSPITAL_COMMUNITY): Payer: 59 | Attending: Hematology & Oncology

## 2016-08-29 ENCOUNTER — Ambulatory Visit (HOSPITAL_BASED_OUTPATIENT_CLINIC_OR_DEPARTMENT_OTHER)
Admission: RE | Admit: 2016-08-29 | Discharge: 2016-08-29 | Disposition: A | Discharge status: Home / Self Care | Payer: 59 | Source: Ambulatory Visit | Attending: Internal Medicine | Admitting: Internal Medicine

## 2016-08-29 ENCOUNTER — Encounter (HOSPITAL_COMMUNITY): Payer: Self-pay | Admitting: Internal Medicine

## 2016-08-29 ENCOUNTER — Encounter (HOSPITAL_COMMUNITY): Payer: Self-pay

## 2016-08-29 ENCOUNTER — Ambulatory Visit (HOSPITAL_COMMUNITY)
Admission: RE | Admit: 2016-08-29 | Discharge: 2016-08-29 | Disposition: A | Discharge status: Home / Self Care | Payer: 59 | Source: Ambulatory Visit | Attending: Internal Medicine | Admitting: Internal Medicine

## 2016-08-29 VITALS — BP 130/64 | HR 69

## 2016-08-29 DIAGNOSIS — C8198 Hodgkin lymphoma, unspecified, lymph nodes of multiple sites: Secondary | ICD-10-CM

## 2016-08-29 DIAGNOSIS — I071 Rheumatic tricuspid insufficiency: Secondary | ICD-10-CM | POA: Diagnosis not present

## 2016-08-29 DIAGNOSIS — I110 Hypertensive heart disease with heart failure: Secondary | ICD-10-CM | POA: Diagnosis not present

## 2016-08-29 DIAGNOSIS — I82402 Acute embolism and thrombosis of unspecified deep veins of left lower extremity: Secondary | ICD-10-CM | POA: Diagnosis not present

## 2016-08-29 DIAGNOSIS — E785 Hyperlipidemia, unspecified: Secondary | ICD-10-CM | POA: Insufficient documentation

## 2016-08-29 DIAGNOSIS — I5021 Acute systolic (congestive) heart failure: Secondary | ICD-10-CM | POA: Diagnosis not present

## 2016-08-29 DIAGNOSIS — C8118 Nodular sclerosis classical Hodgkin lymphoma, lymph nodes of multiple sites: Secondary | ICD-10-CM

## 2016-08-29 DIAGNOSIS — E119 Type 2 diabetes mellitus without complications: Secondary | ICD-10-CM | POA: Insufficient documentation

## 2016-08-29 DIAGNOSIS — Z452 Encounter for adjustment and management of vascular access device: Secondary | ICD-10-CM | POA: Diagnosis not present

## 2016-08-29 MED ORDER — HEPARIN SOD (PORK) LOCK FLUSH 100 UNIT/ML IV SOLN
INTRAVENOUS | Status: AC
  Filled 2016-08-29: qty 5

## 2016-08-29 MED ORDER — SODIUM CHLORIDE 0.9% FLUSH
10.0000 mL | INTRAVENOUS | Status: DC | PRN
  Administered 2016-08-29: 10 mL via INTRAVENOUS
  Filled 2016-08-29: qty 10

## 2016-08-29 MED ORDER — HEPARIN SOD (PORK) LOCK FLUSH 100 UNIT/ML IV SOLN
500.0000 [IU] | Freq: Once | INTRAVENOUS | Status: AC
  Administered 2016-08-29: 500 [IU] via INTRAVENOUS

## 2016-08-29 NOTE — Progress Notes (Signed)
  Echocardiogram 2D Echocardiogram has been performed.  Jennette Dubin 08/29/2016, 9:38 AM

## 2016-08-29 NOTE — Progress Notes (Signed)
Holly Stephens presented for Portacath access and flush. Portacath located right chest wall accessed with  H 20 needle. No blood return present, no resistance met. Portacath flushed with 11m NS and 500U/573mHeparin and needle removed intact. Procedure without incident. Patient tolerated procedure well. Vitals stable and discharged home from clinic ambulatory. Follow up as scheduled.

## 2016-08-29 NOTE — Patient Instructions (Signed)
[facility] Discharge Instructions  RECOMMENDATIONS MADE BY THE CONSULTANT AND ANY TEST RESULTS WILL BE SENT TO YOUR REFERRING PHYSICIAN.  Port flush done  Follow up as scheduled.  Thank you for choosing  Cancer Center at [facility] to provide your oncology and hematology care.  To afford each patient quality time with our provider, please arrive at least 15 minutes before your scheduled appointment time.    If you have a lab appointment with the Cancer Center please come in thru the  Main Entrance and check in at the main information desk  You need to re-schedule your appointment should you arrive 10 or more minutes late.  We strive to give you quality time with our providers, and arriving late affects you and other patients whose appointments are after yours.  Also, if you no show three or more times for appointments you may be dismissed from the clinic at the providers discretion.     Again, thank you for choosing [facility].  Our hope is that these requests will decrease the amount of time that you wait before being seen by our physicians.       _____________________________________________________________  Should you have questions after your visit to [facility], please contact our office at [phone] between the hours of 8:30 a.m. and 4:30 p.m.  Voicemails left after 4:30 p.m. will not be returned until the following business day.  For prescription refill requests, have your pharmacy contact our office.       Resources For Cancer Patients and their Caregivers ? American Cancer Society: Can assist with transportation, wigs, general needs, runs Look Good Feel Better.        1-888-227-6333 ? Cancer Care: Provides financial assistance, online support groups, medication/co-pay assistance.  1-800-813-HOPE (4673) ? [facility] Assists [place] cancer patients and their  families through emotional , educational and financial support.  336-427-4357 ? [place] DSS Where to apply for food stamps, Medicaid and utility assistance. 336-342-1394 ? [facility]: Transportation to medical appointments. 336-347-2287 ? Social Security Administration: May apply for disability if have a Stage IV cancer. 336-342-7796 1-800-772-1213 ? [place] Aging, Disability and Transit Services: Assists with nutrition, care and transit needs. 336-349-2343  Cancer Center Support Programs: @10RELATIVEDAYS@ > Cancer Support Group  2nd Tuesday of the month 1pm-2pm, Journey Room  > Creative Journey  3rd Tuesday of the month 1130am-1pm, Journey Room  > Look Good Feel Better  1st Wednesday of the month 10am-12 noon, Journey Room (Call American Cancer Society to register 1-800-395-5775)   

## 2016-08-29 NOTE — Patient Instructions (Signed)
Follow up as needed

## 2016-08-29 NOTE — Progress Notes (Signed)
CARDIO_ONCOLOGY CLINIC NOTE  Referring Physician: Dr. Whitney Muse   HPI:  Holly Stephens 61 y.o. female with morbid obesity, HIV, HTN, DM, Hodgkin's Disease with lymphatic and possibly pulmonary involvement (Stage III versus Stage IV disease ?pulmonary nodules x 2?).    She started ABVD on 09/13/2015 with curative intent with the discontinuation of Bleomycin on 10/11/2015.  PET scan after cycle #3 demonstrated Deauville 3 and therefore 4 more cycles of AVD were recommended.  Course complicated by hospitalization for diastolic congestive heart failure in 9/17.   Diuresed and improved. Echo with normal EF and grade 1 DD.  Presents today for Cardio-oncology follow up (underwent treatment with adriamycin for Hodgkin's disease). Overall feeling well, but does not have as much energy as she would like. Able to walk around the grocery store without SOB. 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 Says she is not SOB with working. Taking all medications, not weighing at home. She was seen by her Oncologist Dr. Talbert Cage on 07/26/16. Chemotherapy completed on 02/23/16, PET scan on 07/24/16 shows persistent activity in left axilla which is unchanged; however it is Deauville 2. Clinically there are no palpable lymph nodes in the left axilla. Oncology plans to repeat restaging CT C/A/P at 6 month mark after completing treatment which will be in October 2018.   Echo 08/29/16 EF 55%, RV Lat s 10.0 cm/s  GLS -17.5  Echo 10/28/15 EF 60-65% Grade I DD PAP 36 Echo 01/21/16 EF 55-60% RV mildly dilated but normal function Echo 1/12.18 EF 60-65% RV normal Grade 1 DD Lat s 9.9cm/s GLS -17.5   BRIEF ONCOLOGIC HISTORY:        [name], nodular sclerosis ([place])   07/23/2015 Procedure    Lext axilla lymph node biopsy      07/27/2015 Pathology Results    Classical [name], nodular sclerosis type.  Positive CD30, CD15, andPAX-5.  Negative CD45 and CD20.      08/31/2015 PET scan    Hypermetabolic  lymphadenopathy. Nodal stations include: L supraclavicular nodes, L axillary nodes, mediastinal lymph nodes, periaortic retroperitoneal and periportal lymph nodes as well as L iliac and inguinal lymph nodes. Two concerning pulm nodules...      09/01/2015 Imaging    MUGA- Normal LEFT ventricular ejection fraction of 73%.  Normal LEFT ventricular wall motion.      09/01/2015 Procedure    PFTs-  Spirometry shows mild ventilatory defect without airflow obstruction and no bronchodilator improvement. Lung volumes are normal. Airway resistance is normal. DLCO normal. Essentially normal lung function      09/08/2015 Procedure    Port placed by Dr. Rosana Hoes      09/13/2015 - 02/23/2016 Chemotherapy    ABVD      09/27/2015 Imaging    CT angio of chest- no gross PE seen. Improving axillary, mediastinal and upper abdominal adenopathy. Improved and possibly resolved RUL nodule and with resolved or obscured LLL nodule. Small L pleural effusion and mild L lower lobe atelectasis.      10/11/2015 Treatment Plan Change    Bleomycin discontinued       11/01/2015 PET scan    Clear interval response to therapy with decrease in size and hypermetabolism of larger lymph nodes seen previously and some of the smaller hypermetabolic lymph node seen on the previous study have resolved completely in terms of abnormal enlargement and hypermetabolism. 2. Slight decrease in left lower lobe pulmonary nodule with interval resolution of the right upper lobe pulmonary nodule. 3. Stable appearance  of mild heterogeneous FDG accumulation within the marrow space.      11/01/2015 Miscellaneous    Deauville 3- 4 more cycles of AVD recommended according to NCCN guidelines.      02/09/2016 Imaging    [place] left leg- Positive for acute DVT in left calf, popliteal and femoral veins.      03/22/2016 PET scan    1. Mild residual metabolic activity within LEFT axillary lymph nodes (  Deauville 2 ). 2. No additional hypermetabolic adenopathy identified. 3. Normal spleen and marrow activity.      04/25/2016 - 05/23/2016 Radiation Therapy    Residual disease treated with radiation Isidore Moos): Left axilla treated with 36 Gy in 20 fractions.      07/24/2016 PET scan    PET IMPRESSION: 1. Stable exam. The mild residual low level metabolic activity within left axillary lymph nodes a similar to prior (Deauville 2). 2. No new or progressive findings. 3. Abdominal aortic atherosclerosis.               Past Medical History:  Diagnosis Date  . Acute deep vein thrombosis (DVT) of popliteal vein of left lower extremity ([place]) 02/09/2016  . Arthritis    osteoarthritis  . Diabetes mellitus without complication ([place])   . History of radiation therapy 04/25/2016 - 05/23/2016   Left Axilla treated to 36 Gy in 20 fractions  . HIV infection ([place])   . 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 Human immunodeficiency virus (HIV) disease ([place]) 02/23/2006   Qualifier: Diagnosis of  By: Megan Salon MD, John      Current Outpatient Prescriptions  Medication Sig Dispense Refill  . cholecalciferol (VITAMIN D) 1000 units tablet Take 2,000 Units by mouth daily.    Marland Kitchen emollient (RADIAGEL) gel Apply topically as needed for wound care.    . fenofibrate 160 MG tablet Take 160 mg by mouth daily.     . fexofenadine (ALLEGRA) 180 MG tablet Take 180 mg by mouth daily.    . furosemide (LASIX) 40 MG tablet Take 0.5 tablets (20 mg total) by mouth daily. 45 tablet 1  . Garlic 4696 MG CAPS Take 1,000 mg by mouth daily.     Holly Stephens Ethyl (VASCEPA) 1 g CAPS Take by mouth.    Marland Kitchen JANUMET 50-500 MG per tablet Take 1 tablet by mouth 2 (two) times daily with a meal.     . lidocaine-prilocaine (EMLA) cream Apply 1 application topically as needed (prior to accessing port).    . metoprolol tartrate (LOPRESSOR) 25 MG tablet Take 1 tablet (25 mg total) by mouth daily. 30 tablet 0  .  Multiple Vitamin (MULTIVITAMIN WITH MINERALS) TABS tablet Take 1 tablet by mouth daily.    . ODEFSEY 200-25-25 MG TABS tablet TAKE ONE TABLET BY MOUTH ONCE DAILY WITH A MEAL. STORE IN ORIGINAL CONTAINER AT ROOM TEMPERATURE. 90 tablet 0  . rivaroxaban (XARELTO) 20 MG TABS tablet Take 1 tablet (20 mg total) by mouth daily with supper. 30 tablet 2  . spironolactone (ALDACTONE) 25 MG tablet Take 25 mg by mouth daily.    Marland Kitchen TIVICAY 50 MG tablet TAKE ONE TABLET (50 MG) BY MOUTH ONCE DAILY WITH A MEAL. STORE AT [facility]. 90 tablet 0  . UNABLE TO FIND Med Name: Theracurmin 60 mg po daily     No current facility-administered medications for this encounter.     Allergies  Allergen Reactions  . Augmentin [Amoxicillin-Pot Clavulanate] Hives and Other (See Comments)    Has patient  had a PCN reaction causing immediate rash, facial/tongue/throat swelling, SOB or lightheadedness with hypotension: No Has patient had a PCN reaction causing severe rash involving mucus membranes or skin necrosis: No Has patient had a PCN reaction that required hospitalization No Has patient had a PCN reaction occurring within the last 10 years: No If all of the above answers are "NO", then may proceed with Cephalosporin use.  . Bleomycin Cough      Social History   Social History  . Marital status: Widowed    Spouse name: N/A  . Number of children: N/A  . Years of education: N/A   Occupational History  . Not on file.   Social History Main Topics  . Smoking status: Never Smoker  . Smokeless tobacco: Never Used  . Alcohol use No  . Drug use: No  . Sexual activity: No     Comment: declined condoms   Other Topics Concern  . Not on file   Social History Narrative  . No narrative on file      Family History  Problem Relation Age of Onset  . Hypertension Mother   . Cancer Mother 64       breast   . Hypertension Father   . Heart attack Father   . Pulmonary embolism Sister   . Parkinson's disease  Brother     Vitals:   08/29/16 1001  BP: 130/64  Pulse: 69  SpO2: 99%   Wt Readings from Last 3 Encounters:  07/26/16 267 lb 9.6 oz (121.4 kg)  06/23/16 265 lb (120.2 kg)  05/22/16 283 lb 3.2 oz (128.5 kg)     PHYSICAL EXAM: General: Obese female, NAD.  HEENT: Normal  Neck: supple. Hard to assess JVD with body habitus. Carotids 2+ bilat; no bruits. No lymphadenopathy or thryomegaly appreciated. Cor: PMI nondisplaced. Regular rate and rhythm. No rubs, gallops or murmurs. Lungs: Clear bilaterally. Normal effort.  Abdomen: markedly obese, soft, nontender, nondistended. No bruits or masses. Good bowel sounds. Extremities: no cyanosis, clubbing, rash. Prominent varicose veins. No peripheral edema.  Neuro: alert & oriented x 3, cranial nerves grossly intact. moves all 4 extremities w/o difficulty. Affect pleasant.  ASSESSMENT & PLAN: 1. Chronic diastolic HF:  --Echo reviewed today by Dr. Haroldine Laws, EF 55%.  - NYHA II - Volume status stable.  - Continue lasix 20 mg daily.  - Continue Spiro 25 mg daily.  - Reinforced need for 2L fluid restriction and low sodium diet  2. Hodgkin's lymphoma with LN involvement - Follows with Dr. Talbert Cage. Stopped adrymicin in Jan 2018.  - Plan to repeat PET scan in October 2018  3. Morbid Obesity - Encouraged her to start exercising, increase activity  - Decrease portion size  4. DM2 - Follows with PCP  5. LLE DVT  - Lower extremity doppler on 07/26/16 with resolution of DVT in L femoral vein, however nonocclusive thrombus within the peroneal and posterior tibial veins.  - Keep on Xarelto 20mg  with risk factors for recurrent DVT.   6. Varicose veins - Consider support stockings.   Holly Gobble Smith,NP-C  11:10 AM  Patient seen and examined with Holly Booze, NP. We discussed all aspects of the encounter. I agree with the assessment and plan as stated above.   Echo reviewed personally. EF looks good 6 months after completing adriamycin. Discussed  small risk of very late toxicity. Continue Xarelto for DVT. Discussed possibility of long-term therapy with reduced-dose Xarelto to further prevent recurrence of clot. Can f/u PRN.  Glori Bickers, MD  10:47 PM

## 2016-09-05 ENCOUNTER — Other Ambulatory Visit: Payer: 59

## 2016-09-05 DIAGNOSIS — B20 Human immunodeficiency virus [HIV] disease: Secondary | ICD-10-CM

## 2016-09-06 ENCOUNTER — Other Ambulatory Visit: Payer: 59

## 2016-09-06 LAB — T-HELPER CELL (CD4) - (RCID CLINIC ONLY)
CD4 T CELL HELPER: 31 % — AB (ref 33–55)
CD4 T Cell Abs: 810 /uL (ref 400–2700)

## 2016-09-07 LAB — HIV-1 RNA QUANT-NO REFLEX-BLD
HIV 1 RNA QUANT: DETECTED {copies}/mL — AB
HIV-1 RNA Quant, Log: <1.3 Log copies/mL — AB

## 2016-09-15 ENCOUNTER — Ambulatory Visit (INDEPENDENT_AMBULATORY_CARE_PROVIDER_SITE_OTHER): Payer: 59 | Admitting: *Deleted

## 2016-09-15 ENCOUNTER — Other Ambulatory Visit (HOSPITAL_COMMUNITY)
Admission: RE | Admit: 2016-09-15 | Discharge: 2016-09-15 | Disposition: A | Discharge status: Home / Self Care | Payer: 59 | Source: Ambulatory Visit | Attending: Internal Medicine | Admitting: Internal Medicine

## 2016-09-15 ENCOUNTER — Encounter: Payer: Self-pay | Admitting: *Deleted

## 2016-09-15 DIAGNOSIS — Z113 Encounter for screening for infections with a predominantly sexual mode of transmission: Secondary | ICD-10-CM | POA: Insufficient documentation

## 2016-09-15 DIAGNOSIS — Z124 Encounter for screening for malignant neoplasm of cervix: Secondary | ICD-10-CM | POA: Insufficient documentation

## 2016-09-15 NOTE — Patient Instructions (Signed)
Your results will be in MyChart in about a week.  Thank you for coming to see Colletta Maryland and I.  Langley Gauss, RN

## 2016-09-15 NOTE — Progress Notes (Signed)
Subjective:     Holly Stephens is a 61 y.o. woman who comes in today for a  pap smear only.   Previous abnormal Pap smears: no. Contraception:condoms   Objective:    There were no vitals taken for this visit. Pelvic Exam: . Pap smear obtained.   Assessment:    Screening pap smear.   Plan:    Follow up in one year or as indicated by Pap results.  Patient given condoms.

## 2016-09-18 LAB — CYTOLOGY - PAP: Diagnosis: NEGATIVE

## 2016-09-18 LAB — CERVICOVAGINAL ANCILLARY ONLY
CHLAMYDIA, DNA PROBE: NEGATIVE
Neisseria Gonorrhea: NEGATIVE

## 2016-09-20 ENCOUNTER — Encounter: Payer: Self-pay | Admitting: Internal Medicine

## 2016-09-20 ENCOUNTER — Ambulatory Visit (INDEPENDENT_AMBULATORY_CARE_PROVIDER_SITE_OTHER): Payer: 59 | Admitting: Internal Medicine

## 2016-09-20 DIAGNOSIS — B20 Human immunodeficiency virus [HIV] disease: Secondary | ICD-10-CM

## 2016-09-20 NOTE — Assessment & Plan Note (Signed)
Her adherence is perfect and as a result her infection remains under excellent, long-term control. She will continue [place] and follow-up after blood work in one year.

## 2016-09-20 NOTE — Progress Notes (Signed)
Patient Active Problem List   Diagnosis Date Noted  . Human immunodeficiency virus (HIV) disease ([place]) 02/23/2006    Priority: High  . 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[place]) 04/05/2016  . Acute deep vein thrombosis (DVT) of popliteal vein of left lower extremity ([place]) 02/09/2016  . Unintentional weight loss 12/16/2015  . Congestive heart failure with cardiomyopathy ([place]) 10/27/2015  . Acute congestive heart failure ([place]) 10/27/2015  . [name], nodular sclerosis ([place]) 08/23/2015  . Visual floaters 09/12/2011  . Episodic low back pain 11/15/2010  . ROTATOR CUFF INJURY, RIGHT SHOULDER 11/03/2008  . ADHESIVE CAPSULITIS OF SHOULDER 11/12/2007  . OBESITY NOS 05/17/2006  . SYNDROME, CARPAL TUNNEL 05/17/2006  . FOOT PAIN 05/17/2006  . Hyperlipidemia 02/23/2006  . DEPRESSION 02/23/2006  . Essential hypertension 02/23/2006    Patient's Medications  New Prescriptions   No medications on file  Previous Medications   CHOLECALCIFEROL (VITAMIN D) 1000 UNITS TABLET    Take 2,000 Units by mouth daily.   EMOLLIENT (RADIAGEL) GEL    Apply topically as needed for wound care.   FENOFIBRATE 160 MG TABLET    Take 160 mg by mouth daily.    FEXOFENADINE (ALLEGRA) 180 MG TABLET    Take 180 mg by mouth daily.   FUROSEMIDE (LASIX) 40 MG TABLET    Take 0.5 tablets (20 mg total) by mouth daily.   GARLIC 7867 MG CAPS    Take 1,000 mg by mouth daily.    ICOSAPENT ETHYL (VASCEPA) 1 G CAPS    Take by mouth.   JANUMET 50-500 MG PER TABLET    Take 1 tablet by mouth 2 (two) times daily with a meal.    LIDOCAINE-PRILOCAINE (EMLA) CREAM    Apply 1 application topically as needed (prior to accessing port).   METOPROLOL TARTRATE (LOPRESSOR) 25 MG TABLET    Take 1 tablet (25 mg total) by mouth daily.   MULTIPLE VITAMIN (MULTIVITAMIN WITH MINERALS) TABS TABLET    Take 1 tablet by mouth daily.   ODEFSEY 200-25-25 MG TABS TABLET    TAKE ONE TABLET BY MOUTH ONCE DAILY WITH  A MEAL. STORE IN ORIGINAL CONTAINER AT ROOM TEMPERATURE.   RIVAROXABAN (XARELTO) 20 MG TABS TABLET    Take 1 tablet (20 mg total) by mouth daily with supper.   SPIRONOLACTONE (ALDACTONE) 25 MG TABLET    Take 25 mg by mouth daily.   TIVICAY 50 MG TABLET    TAKE ONE TABLET (50 MG) BY MOUTH ONCE DAILY WITH A MEAL. STORE AT [facility].   UNABLE TO FIND    Med Name: Theracurmin 60 mg po daily  Modified Medications   No medications on file  Discontinued Medications   No medications on file    Subjective: Holly Stephens is in for her routine HIV follow-up visit. As usual she never misses a single dose of her FCE or Tivicay. She takes them each morning as soon as she gets up. She has completed therapy for her Hodgkin's disease. She is back at work full time her stamina is increasing and she is feeling much better. She remains on anticoagulation for lower extremity DVT. She recently saw her cardiologist and had a normal ejection fraction on echocardiogram.   Review of Systems: Review of Systems  Constitutional: Positive for malaise/fatigue. Negative for chills, diaphoresis, fever and weight loss.  HENT: Negative for sore throat.   Respiratory: Negative for cough, sputum production and shortness of  breath.   Cardiovascular: Negative for chest pain.  Gastrointestinal: Negative for abdominal pain, diarrhea, heartburn, nausea and vomiting.  Genitourinary: Negative for dysuria and frequency.  Musculoskeletal: Negative for joint pain and myalgias.  Skin: Negative for rash.  Neurological: Negative for dizziness and headaches.  Psychiatric/Behavioral: Negative for depression and substance abuse. The patient is not nervous/anxious.     Past Medical History:  Diagnosis Date  . Acute deep vein thrombosis (DVT) of popliteal vein of left lower extremity ([place]) 02/09/2016  . Arthritis    osteoarthritis  . Diabetes mellitus without complication ([place])   . History of radiation therapy 04/25/2016 - 05/23/2016    Left Axilla treated to 36 Gy in 20 fractions  . HIV infection ([place])   . [name], nodular sclerosis ([place]) 08/23/2015  . Human immunodeficiency virus (HIV) disease ([place]) 02/23/2006   Qualifier: Diagnosis of  By: Megan Salon MD, Dolly Harbach      Social History  Substance Use Topics  . Smoking status: Never Smoker  . Smokeless tobacco: Never Used  . Alcohol use No    Family History  Problem Relation Age of Onset  . Hypertension Mother   . Cancer Mother 96       breast   . Hypertension Father   . Heart attack Father   . Pulmonary embolism Sister   . Parkinson's disease Brother     Allergies  Allergen Reactions  . Augmentin [Amoxicillin-Pot Clavulanate] Hives and Other (See Comments)    Has patient had a PCN reaction causing immediate rash, facial/tongue/throat swelling, SOB or lightheadedness with hypotension: No Has patient had a PCN reaction causing severe rash involving mucus membranes or skin necrosis: No Has patient had a PCN reaction that required hospitalization No Has patient had a PCN reaction occurring within the last 10 years: No If all of the above answers are "NO", then may proceed with Cephalosporin use.  . Bleomycin Cough    Objective:  Vitals:   09/20/16 0904  BP: 125/74  Pulse: 65  Temp: 97.9 F (36.6 C)  TempSrc: Oral  Weight: 268 lb (121.6 kg)  Height: 5\' 1"  (1.549 m)   Body mass index is 50.64 kg/m.  Physical Exam  Constitutional: She is oriented to person, place, and time.  She is in good spirits.  HENT:  Mouth/Throat: No oropharyngeal exudate.  Eyes: Conjunctivae are normal.  Cardiovascular: Normal rate and regular rhythm.   No murmur heard. Pulmonary/Chest: Effort normal and breath sounds normal.  Abdominal: Soft. She exhibits no mass. There is no tenderness.  Musculoskeletal: Normal range of motion.  Neurological: She is alert and oriented to person, place, and time.  Skin: No rash noted.  Psychiatric: Mood and affect normal.    Lab  Results Lab Results  Component Value Date   WBC 7.4 07/04/2016   HGB 13.2 07/04/2016   HCT 40.5 07/04/2016   MCV 90.0 07/04/2016   PLT 260 07/04/2016    Lab Results  Component Value Date   CREATININE 0.73 07/04/2016   BUN 16 07/04/2016   NA 139 07/04/2016   K 3.6 07/04/2016   CL 100 (L) 07/04/2016   CO2 30 07/04/2016    Lab Results  Component Value Date   ALT 47 07/04/2016   AST 44 (H) 07/04/2016   ALKPHOS 38 07/04/2016   BILITOT 0.5 07/04/2016    Lab Results  Component Value Date   CHOL 228 (H) 06/01/2015   HDL 38 (L) 06/01/2015   LDLCALC 158 (H) 06/01/2015   TRIG  162 (H) 06/01/2015   CHOLHDL 6.0 (H) 06/01/2015   Lab Results  Component Value Date   LABRPR NON REAC 06/01/2015   HIV 1 RNA Quant (copies/mL)  Date Value  09/05/2016 <20 DETECTED (A)  03/02/2016 <20  12/02/2015 <20   CD4 T Cell Abs (/uL)  Date Value  09/05/2016 810  03/02/2016 530  12/02/2015 290 (L)     Problem List Items Addressed This Visit      High   Human immunodeficiency virus (HIV) disease ([place])    Her adherence is perfect and as a result her infection remains under excellent, long-term control. She will continue [place] and follow-up after blood work in one year.      Relevant Orders   CBC   T-helper cell (CD4)- ([facility] clinic only)   Comprehensive metabolic panel   Lipid panel   RPR   HIV 1 RNA quant-no reflex-bld        Michel Bickers, MD [facility] for [place] [phone] pager   [phone] cell 09/20/2016, 9:33 AM

## 2016-10-24 ENCOUNTER — Ambulatory Visit (HOSPITAL_COMMUNITY)
Admission: RE | Admit: 2016-10-24 | Discharge: 2016-10-24 | Disposition: A | Discharge status: Home / Self Care | Payer: 59 | Source: Ambulatory Visit | Attending: Oncology | Admitting: Oncology

## 2016-10-24 DIAGNOSIS — I82432 Acute embolism and thrombosis of left popliteal vein: Secondary | ICD-10-CM | POA: Diagnosis not present

## 2016-10-24 DIAGNOSIS — M7989 Other specified soft tissue disorders: Secondary | ICD-10-CM | POA: Diagnosis not present

## 2016-10-27 ENCOUNTER — Encounter (HOSPITAL_COMMUNITY): Payer: 59 | Attending: Hematology & Oncology | Admitting: Oncology

## 2016-10-27 ENCOUNTER — Encounter (HOSPITAL_COMMUNITY): Payer: Self-pay

## 2016-10-27 ENCOUNTER — Encounter (HOSPITAL_BASED_OUTPATIENT_CLINIC_OR_DEPARTMENT_OTHER): Payer: 59

## 2016-10-27 VITALS — BP 129/62 | HR 71 | Temp 97.5°F | Resp 18 | Wt 272.1 lb

## 2016-10-27 DIAGNOSIS — I509 Heart failure, unspecified: Secondary | ICD-10-CM

## 2016-10-27 DIAGNOSIS — C8114 Nodular sclerosis classical Hodgkin lymphoma, lymph nodes of axilla and upper limb: Secondary | ICD-10-CM

## 2016-10-27 DIAGNOSIS — Z923 Personal history of irradiation: Secondary | ICD-10-CM

## 2016-10-27 DIAGNOSIS — I429 Cardiomyopathy, unspecified: Secondary | ICD-10-CM

## 2016-10-27 DIAGNOSIS — Z86718 Personal history of other venous thrombosis and embolism: Secondary | ICD-10-CM | POA: Diagnosis not present

## 2016-10-27 DIAGNOSIS — C8118 Nodular sclerosis classical Hodgkin lymphoma, lymph nodes of multiple sites: Secondary | ICD-10-CM

## 2016-10-27 DIAGNOSIS — C8110 Nodular sclerosis classical Hodgkin lymphoma, unspecified site: Secondary | ICD-10-CM

## 2016-10-27 DIAGNOSIS — I82432 Acute embolism and thrombosis of left popliteal vein: Secondary | ICD-10-CM

## 2016-10-27 LAB — CBC WITH DIFFERENTIAL/PLATELET
Basophils Absolute: 0 10*3/uL (ref 0.0–0.1)
Basophils Relative: 0 %
EOS ABS: 0.2 10*3/uL (ref 0.0–0.7)
EOS PCT: 2 %
HCT: 43.5 % (ref 36.0–46.0)
Hemoglobin: 14.2 g/dL (ref 12.0–15.0)
LYMPHS ABS: 2.6 10*3/uL (ref 0.7–4.0)
Lymphocytes Relative: 33 %
MCH: 30.7 pg (ref 26.0–34.0)
MCHC: 32.6 g/dL (ref 30.0–36.0)
MCV: 94 fL (ref 78.0–100.0)
Monocytes Absolute: 0.7 10*3/uL (ref 0.1–1.0)
Monocytes Relative: 9 %
Neutro Abs: 4.5 10*3/uL (ref 1.7–7.7)
Neutrophils Relative %: 56 %
PLATELETS: 275 10*3/uL (ref 150–400)
RBC: 4.63 MIL/uL (ref 3.87–5.11)
RDW: 14.3 % (ref 11.5–15.5)
WBC: 8.1 10*3/uL (ref 4.0–10.5)

## 2016-10-27 LAB — COMPREHENSIVE METABOLIC PANEL
ALT: 101 U/L — AB (ref 14–54)
AST: 91 U/L — AB (ref 15–41)
Albumin: 4.3 g/dL (ref 3.5–5.0)
Alkaline Phosphatase: 39 U/L (ref 38–126)
Anion gap: 13 (ref 5–15)
BUN: 16 mg/dL (ref 6–20)
CHLORIDE: 101 mmol/L (ref 101–111)
CO2: 28 mmol/L (ref 22–32)
CREATININE: 0.66 mg/dL (ref 0.44–1.00)
Calcium: 10.1 mg/dL (ref 8.9–10.3)
GFR calc non Af Amer: >60 mL/min (ref >60)
Glucose, Bld: 93 mg/dL (ref 65–99)
Potassium: 3.8 mmol/L (ref 3.5–5.1)
SODIUM: 142 mmol/L (ref 135–145)
Total Bilirubin: 0.6 mg/dL (ref 0.3–1.2)
Total Protein: 7.7 g/dL (ref 6.5–8.1)

## 2016-10-27 LAB — LACTATE DEHYDROGENASE: LDH: 173 U/L (ref 98–192)

## 2016-10-27 MED ORDER — HEPARIN SOD (PORK) LOCK FLUSH 100 UNIT/ML IV SOLN
500.0000 [IU] | Freq: Once | INTRAVENOUS | Status: AC
  Administered 2016-10-27: 500 [IU] via INTRAVENOUS

## 2016-10-27 MED ORDER — SODIUM CHLORIDE 0.9% FLUSH
10.0000 mL | INTRAVENOUS | Status: DC | PRN
  Administered 2016-10-27: 10 mL via INTRAVENOUS
  Filled 2016-10-27: qty 10

## 2016-10-27 MED ORDER — FUROSEMIDE 20 MG PO TABS
20.0000 mg | ORAL_TABLET | Freq: Every day | ORAL | 2 refills | Status: AC
Start: 2016-10-27 — End: ?

## 2016-10-27 NOTE — Progress Notes (Signed)
Holly Stephens presented for Portacath access and flush. Portacath located right chest wall accessed with  H 20 needle. Good blood return present. Portacath flushed with 89ml NS and 500U/55ml Heparin and needle removed intact. Patient tolerated treatment without incidence.  Patient discharged ambulatory and in stable condition from clinic.  Patient to follow up as scheduled.

## 2016-10-27 NOTE — Progress Notes (Signed)
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  Positive CD30, CD15, andPAX-5.  Negative CD45 and CD20.      08/31/2015 PET scan    Hypermetabolic lymphadenopathy. Nodal stations include: L supraclavicular nodes, L axillary nodes, mediastinal lymph nodes, periaortic retroperitoneal and periportal lymph nodes as well as L iliac and inguinal lymph nodes. Two concerning pulm nodules...      09/01/2015 Imaging    MUGA- Normal LEFT ventricular ejection fraction of 73%.  Normal LEFT ventricular wall motion.      09/01/2015 Procedure    PFTs-  Spirometry shows mild ventilatory defect without airflow obstruction and no bronchodilator improvement. Lung volumes are normal. Airway resistance is normal. DLCO normal. Essentially normal lung function      09/08/2015 Procedure    Port placed by Dr. Rosana Stephens      09/13/2015 - 02/23/2016 Chemotherapy    ABVD      09/27/2015 Imaging    CT angio of chest- no gross PE seen. Improving axillary, mediastinal and upper abdominal adenopathy. Improved and possibly resolved RUL nodule and with resolved or obscured LLL nodule. Small L pleural effusion and mild L lower lobe atelectasis.      10/11/2015 Treatment Plan Change    Bleomycin discontinued       11/01/2015 PET scan    Clear interval response to therapy with decrease in size and hypermetabolism of larger lymph nodes seen  previously and some of the smaller hypermetabolic lymph node seen on the previous study have resolved completely in terms of abnormal enlargement and hypermetabolism. 2. Slight decrease in left lower lobe pulmonary nodule with interval resolution of the right upper lobe pulmonary nodule. 3. Stable appearance of mild heterogeneous FDG accumulation within the marrow space.      11/01/2015 Miscellaneous    Deauville 3- 4 more cycles of AVD recommended according to NCCN guidelines.      02/09/2016 Imaging    [place] left leg- Positive for acute DVT in left calf, popliteal and femoral veins.      03/22/2016 PET scan    1. Mild residual metabolic activity within LEFT axillary lymph nodes ( Deauville 2 ). 2. No additional hypermetabolic adenopathy identified. 3. Normal spleen and marrow activity.      04/25/2016 - 05/23/2016 Radiation Therapy    Residual disease treated with radiation Holly Stephens): Left axilla treated with 36 Gy in 20 fractions.      07/24/2016 PET scan    PET IMPRESSION: 1. Stable exam. The mild residual low level metabolic activity within left axillary lymph nodes a similar to prior (Deauville 2). 2. No new or progressive findings. 3. Abdominal aortic atherosclerosis.         HISTORY OF PRESENT ILLNESS:  (From Holly Crigler, PA-C's last note on 03/27/16)     INTERVAL HISTORY:  Holly Stephens 61 y.o. female returns for follow-up for nodular-sclerosing Hodgkin's lymphoma.  She states that  she has been doing well. She has been having some night sweats which is not new. She states she has not palpated any new lymphadenopathy. Otherwise ROS as below.  REVIEW OF SYSTEMS:  Review of Systems  Constitutional: Positive for fatigue. Negative for chills and fever.  HENT:  Negative.  Negative for nosebleeds.   Eyes: Negative.   Respiratory: Negative.  Negative for cough and shortness of breath.   Cardiovascular: Negative.  Negative for chest pain and leg swelling.  Gastrointestinal:  Positive for constipation. Negative for abdominal pain, blood in stool, diarrhea, nausea and vomiting.  Endocrine: Negative.   Genitourinary: Negative.  Negative for dysuria and hematuria.   Musculoskeletal: Negative.  Negative for arthralgias.  Skin: Negative.   Neurological: Positive for numbness (in toes). Negative for dizziness and headaches.  Hematological: Negative.  Negative for adenopathy. Does not bruise/bleed easily.  Psychiatric/Behavioral: Negative.  Negative for depression and sleep disturbance. The patient is not nervous/anxious.      PAST MEDICAL/SURGICAL HISTORY:  Past Medical History:  Diagnosis Date  . Acute deep vein thrombosis (DVT) of popliteal vein of left lower extremity (Holly Stephens) 02/09/2016  . Arthritis    osteoarthritis  . Diabetes mellitus without complication ([place])   . History of radiation therapy 04/25/2016 - 05/23/2016   Left Axilla treated to 36 Gy in 20 fractions  . HIV infection ([place])   . [name], nodular sclerosis ([place]) 08/23/2015  . Human immunodeficiency virus (HIV) disease ([place]) 02/23/2006   Qualifier: Diagnosis of  By: Holly Salon MD, Holly Stephens     Past Surgical History:  Procedure Laterality Date  . AXILLARY LYMPH NODE BIOPSY Left 07/23/2015   Procedure: EXCISIONAL BIOPSY LEFT AXILLARY LYMPH NODE;  Surgeon: [name], MD;  Location: [facility];  Service: General;  Laterality: Left;  . PORTACATH PLACEMENT Right 09/08/2015   Procedure: INSERTION OF CENTRAL VENOUS CATHETER WITH PORT FOR CHEMOTHERAPY;  Surgeon: [name], MD;  Location: [facility];  Service: General;  Laterality: Right;  . SHOULDER SURGERY Right    removal of bone spur     SOCIAL HISTORY:  Social History   Social History  . Marital status: Widowed    Spouse name: N/A  . Number of children: N/A  . Years of education: N/A   Occupational History  . Not on file.   Social History Main Topics  . Smoking status: Never Smoker  . Smokeless tobacco: Never Used  . Alcohol use No    . Drug use: No  . Sexual activity: No     Comment: declined condoms   Other Topics Concern  . Not on file   Social History Narrative  . No narrative on file    FAMILY HISTORY:  Family History  Problem Relation Age of Onset  . Hypertension Mother   . Cancer Mother 89       breast   . Hypertension Father   . Heart attack Father   . Pulmonary embolism Sister   . Parkinson's disease Brother     CURRENT MEDICATIONS:  Outpatient Encounter Prescriptions as of 10/27/2016  Medication Sig  . cholecalciferol (VITAMIN D) 1000 units tablet Take 2,000 Units by mouth daily.  Marland Kitchen emollient (RADIAGEL) gel Apply topically as needed for wound care.  . fenofibrate 160 MG tablet Take 160 mg by mouth daily.   . fexofenadine (ALLEGRA) 180 MG tablet Take 180 mg by mouth daily.  . furosemide (LASIX) 20 MG tablet Take 1 tablet (20 mg total) by mouth daily.  Marland Kitchen  Garlic 1025 MG CAPS Take 1,000 mg by mouth daily.   Vanessa Kick Ethyl (VASCEPA) 1 g CAPS Take by mouth.  Marland Kitchen JANUMET 50-500 MG per tablet Take 1 tablet by mouth 2 (two) times daily with a meal.   . lidocaine-prilocaine (EMLA) cream Apply 1 application topically as needed (prior to accessing port).  . metoprolol tartrate (LOPRESSOR) 25 MG tablet Take 1 tablet (25 mg total) by mouth daily.  . Multiple Vitamin (MULTIVITAMIN WITH MINERALS) TABS tablet Take 1 tablet by mouth daily.  . ODEFSEY 200-25-25 MG TABS tablet TAKE ONE TABLET BY MOUTH ONCE DAILY WITH A MEAL. STORE IN ORIGINAL CONTAINER AT ROOM TEMPERATURE.  . rivaroxaban (XARELTO) 20 MG TABS tablet Take 1 tablet (20 mg total) by mouth daily with supper.  Marland Kitchen spironolactone (ALDACTONE) 25 MG tablet Take 25 mg by mouth daily.  Marland Kitchen TIVICAY 50 MG tablet TAKE ONE TABLET (50 MG) BY MOUTH ONCE DAILY WITH A MEAL. STORE AT [facility]  Marland Kitchen UNABLE TO FIND Med Name: Theracurmin 60 mg po daily  . [DISCONTINUED] furosemide (LASIX) 40 MG tablet Take 0.5 tablets (20 mg total) by mouth daily.   No  facility-administered encounter medications on file as of 10/27/2016.     ALLERGIES:  Allergies  Allergen Reactions  . Augmentin [Amoxicillin-Pot Clavulanate] Hives and Other (See Comments)    Has patient had a PCN reaction causing immediate rash, facial/tongue/throat swelling, SOB or lightheadedness with hypotension: No Has patient had a PCN reaction causing severe rash involving mucus membranes or skin necrosis: No Has patient had a PCN reaction that required hospitalization No Has patient had a PCN reaction occurring within the last 10 years: No If all of the above answers are "NO", then may proceed with Cephalosporin use.  . Bleomycin Cough     PHYSICAL EXAM:  ECOG Performance status: 1 - Symptomatic, but largely independent   Vitals:   10/27/16 1016  BP: 129/62  Pulse: 71  Resp: 18  Temp: (!) 97.5 F (36.4 C)  SpO2: 98%   Filed Weights   10/27/16 1016  Weight: 272 lb 1.6 oz (123.4 kg)    Physical Exam  Constitutional: She is oriented to person, place, and time and well-developed, well-nourished, and in no distress. No distress.  HENT:  Head: Normocephalic and atraumatic.  Mouth/Throat: Oropharynx is clear and moist. No oropharyngeal exudate.  Eyes: Pupils are equal, round, and reactive to light. Conjunctivae are normal. No scleral icterus.  Neck: Normal range of motion. Neck supple. No JVD present.  Cardiovascular: Normal rate, regular rhythm and normal heart sounds.  Exam reveals no gallop and no friction rub.   No murmur heard. Pulmonary/Chest: Effort normal and breath sounds normal. No respiratory distress. She has no wheezes. She has no rales.  Abdominal: Soft. Bowel sounds are normal. She exhibits no distension. There is no tenderness. There is no rebound and no guarding.  Musculoskeletal: Normal range of motion. She exhibits no edema or tenderness.  Lymphadenopathy:    She has no cervical adenopathy.    She has no axillary adenopathy.       Right: No  supraclavicular adenopathy present.       Left: No supraclavicular adenopathy present.  (  Neurological: She is alert and oriented to person, place, and time. No cranial nerve deficit. Gait normal.  Skin: Skin is warm and dry. No rash noted. No erythema. No pallor.  Psychiatric: Mood, memory, affect and judgment normal.  Nursing note and vitals reviewed.    LABORATORY DATA:  I have reviewed the labs as listed.  CBC    Component Value Date/Time   WBC 7.4 07/04/2016 0933   RBC 4.50 07/04/2016 0933   HGB 13.2 07/04/2016 0933   HCT 40.5 07/04/2016 0933   PLT 260 07/04/2016 0933   MCV 90.0 07/04/2016 0933   MCH 29.3 07/04/2016 0933   MCHC 32.6 07/04/2016 0933   RDW 15.0 07/04/2016 0933   LYMPHSABS 2.0 07/04/2016 0933   MONOABS 0.5 07/04/2016 0933   EOSABS 0.2 07/04/2016 0933   BASOSABS 0.0 07/04/2016 0933   CMP Latest Ref Rng & Units 07/04/2016 05/26/2016 05/08/2016  Glucose 65 - 99 mg/dL 157(H) 115(H) 160(H)  BUN 6 - 20 mg/dL 16 21(H) 16  Creatinine 0.44 - 1.00 mg/dL 0.73 0.85 0.74  Sodium 135 - 145 mmol/L 139 139 134(L)  Potassium 3.5 - 5.1 mmol/L 3.6 4.3 3.1(L)  Chloride 101 - 111 mmol/L 100(L) 104 100(L)  CO2 22 - 32 mmol/L '30 27 25  '$ Calcium 8.9 - 10.3 mg/dL 9.9 10.2 9.5  Total Protein 6.5 - 8.1 g/dL 6.9 7.5 7.1  Total Bilirubin 0.3 - 1.2 mg/dL 0.5 0.4 0.1(L)  Alkaline Phos 38 - 126 U/L 38 42 41  AST 15 - 41 U/L 44(H) 37 27  ALT 14 - 54 U/L 47 39 27    PENDING LABS:    DIAGNOSTIC IMAGING:  Most recent PET scan: 03/22/16     PATHOLOGY:  (L) axillary biopsy: 07/23/15    ASSESSMENT & PLAN:   Hodgkin's Lymphoma, nodular-sclerosing type involving multiple lymph nodes:  -CD30, CD15, & PAX-5 positive; s/p ABVD chemotherapy beginning on 09/13/15, with discontinuation of Bleomycin on 10/11/15.  Mid-course PET scan revealed Deauville 3 lymphadenopathy, so she received 4 additional cycles of AVD.  Chemo course was complicated by diastolic heart failure (followed by Dr.  Haroldine Laws), in addition to HIV (followed by Dr. Megan Stephens with ID). Chemotherapy completed on 02/23/16. Post-treatment PET revealed mild residual (L) axillary adenopathy (Deauville 2), so she was referred for axillary radiation therapy.  -Completed XRT to left axilla on 05/23/16.  -Repeat PET demonstrates persistent activity in left axilla which is unchanged; however it is Deauville 2. Clinically there are no palpable lymph nodes in the left axilla. -I have ordered her restaging CT C/A/P to be repeated in 1 month, which will be the 6 month mark after completing treattment. -Port flush today and q8 weeks. Discussed keeping the port in for a full year prior to removing it if she remains in remission. -Labs today and on next visit.   NCCN guidelines for surveillance of Hodgkin's Lymphoma (Age >/= 32) (1.2017):  A. Complete remission should be documented including reversion of PET to "negative" within 3 months following completion of therapy.  B. It is recommended that the patient be provided with a treatment summary at the completion of his/her therapy, including details of radiation therapy, organs at risk, and cumulative anthracycline dosage given.  C. Follow-up with an oncologist is recommended, especially during the first 5 years after treatment to detect recurrence, and then annually due to the risk of late complications including second cancers and cardiovascular disease. Late relapse or transformation to large cell lymphoma may occur in [place].  D. Frequency and types of tests may vary depending on clinical circumstances: age and stage at diagnosis, social habits, treatment modality, etc. There are few data to support specific recommendations; these represent the range of practice at [facility] [employer].   1. Follow-up after completion of treatment up to 5  years:   A. Interim H and P: Every 3-6 months for 1-2 years, then every 6-12 months until year 3, then annually.   B. Annual influenza  vaccine.   C. Laboratory studies:    1. CBC, platelets, ESR (if elevated at time of initial diagnosis), chemistry profile as clinically indicated.    2. Thyroid stimulating hormone (TSH) at least annually if radiation therapy to neck.   D. Acceptable to obtain a neck/chest/abdomen/pelvis CT scan with contrast, at 6, 12, and 24 month following completion of therapy, or as clinically indicated. PET/CT only if last PET was Deauville 4-5, to confirm complete response.   E. Counseling: Reproduction, health habits, psychosocial, cardiovascular, breast self-exam, skin cancer risk, end of treatment discussion.   F. Surveillance PET should not be done routinely due to risk of false positives. Management decision should not be based on PET scan alone; clinical or pathologic correlation is needed.   2. Follow-up and monitoring after 5 years:   A. Interim H&P: Annually    1. Annual blood pressure, aggressive management of cardiovascular risk factors.    2. Pneumococcal, meningococcal, and H-flu revaccination after 5-7 years, if patient treated with splenic radiation therapy or previous splenectomy (according to CBC recommendations).    3. Annual influenza vaccine.   B. Cardiovascular symptoms may emerge at a young age.    1. Consider stress test/echocardiogram at 10 year intervals after treatment is completed.    2. Consider carotid ultrasound at 10 year intervals if neck irradiation.   C. Laboratory studies:    1. CBC, platelets, chemistry profile annually    2. TSH at least annually if radiation therapy to neck    3. Biannual lipids    4. Annual fasting glucose   D. Annual breast screening: Initiate 8-10 years post therapy, or at age 62, whichever comes first, if chest or axillary radiation. The NCCN [name] Guidelines Panel recommends breast MRI in addition to mammography for women who've received irradiation to the chest between ages 86-30 years, which is consistent with the [place] (ACS) Guidelines. Consider referral to a breast specialist.   E. Perform other routine surveillance tests for cervical, colorectal, endometrial, lung, and prostate cancer as per the ACS Cancer Screening Guidelines.   F. Counseling: Reproduction, health habits, psychosocial, cardiovascular, breast self-exam, and skin cancer risk.   G. Treatment summary and consideration of transfer to primary care provider.   H. Consider a referral to a survivorship clinic.    Left LE DVT:  -Reviewed LLE Doppler venous US from 10/24/16 with the patient in detail. Her previous nonocclusive thrombus has resolved. Therefore I have told her she can stop taking the Xarelto.  HIV:  -Under excellent control per Infectious Diseases. Continue current anti-viral regimen.  -Maintain follow-up with Dr. Megan Stephens, as clinically indicated.   Refilled her Rx for lasix today.    Dispo:  RTC in 27monthfor follow up with CT C/A/P and labs prior to next visit.  Orders Placed This Encounter  Procedures  . CT Abdomen Pelvis W Contrast    Standing Status:   Future    Standing Expiration Date:   10/27/2017    Order Specific Question:   If indicated for the ordered procedure, I authorize the administration of contrast media per Radiology protocol    Answer:   Yes    Order Specific Question:   [facility]?    [facility]   Order Specific Question:  Radiology Contrast Protocol - do NOT remove file path    Answer:   \\[email or web address].pdf  . CT Chest W Contrast    Standing Status:   Future    Standing Expiration Date:   10/27/2017    Order Specific Question:   If indicated for the ordered procedure, I authorize the administration of contrast media per Radiology protocol    Answer:   Yes    Order Specific Question:   Preferred imaging location?    Answer:   [facility]    Order Specific Question:   Radiology Contrast Protocol - do NOT remove file path     Answer:   \\[email or web address].pdf  . CBC with Differential    Standing Status:   Future    Standing Expiration Date:   10/27/2017  . Comprehensive metabolic panel    Standing Status:   Future    Standing Expiration Date:   10/27/2017  . Lactate dehydrogenase    Standing Status:   Future    Standing Expiration Date:   10/27/2017    All questions were answered to patient's stated satisfaction. Encouraged patient to call with any new concerns or questions before her next visit to the cancer center and we can certain see her sooner, if needed.      Orders placed this encounter:  Orders Placed This Encounter  Procedures  . CT Abdomen Pelvis W Contrast  . CT Chest W Contrast  . CBC with Differential  . Comprehensive metabolic panel  . Lactate dehydrogenase     Twana First, MD

## 2016-10-27 NOTE — Patient Instructions (Signed)
[facility] at [facility] Discharge Instructions  RECOMMENDATIONS MADE BY THE CONSULTANT AND ANY TEST RESULTS WILL BE SENT TO YOUR REFERRING PHYSICIAN.  You saw Dr. Zhou today.  Thank you for choosing [facility] at [facility] to provide your oncology and hematology care.  To afford each patient quality time with our provider, please arrive at least 15 minutes before your scheduled appointment time.    If you have a lab appointment with the Cancer Center please come in thru the  Main Entrance and check in at the main information desk  You need to re-schedule your appointment should you arrive 10 or more minutes late.  We strive to give you quality time with our providers, and arriving late affects you and other patients whose appointments are after yours.  Also, if you no show three or more times for appointments you may be dismissed from the clinic at the providers discretion.     Again, thank you for choosing [facility].  Our hope is that these requests will decrease the amount of time that you wait before being seen by our physicians.       _____________________________________________________________  Should you have questions after your visit to [facility], please contact our office at [phone] between the hours of 8:30 a.m. and 4:30 p.m.  Voicemails left after 4:30 p.m. will not be returned until the following business day.  For prescription refill requests, have your pharmacy contact our office.       Resources For Cancer Patients and their Caregivers ? American Cancer Society: Can assist with transportation, wigs, general needs, runs Look Good Feel Better.        1-888-227-6333 ? Cancer Care: Provides financial assistance, online support groups, medication/co-pay assistance.  1-800-813-HOPE (4673) ? [facility] Assists [place] cancer patients and their families through  emotional , educational and financial support.  336-427-4357 ? [place] DSS Where to apply for food stamps, Medicaid and utility assistance. 336-342-1394 ? [facility]: Transportation to medical appointments. 336-347-2287 ? Social Security Administration: May apply for disability if have a Stage IV cancer. 336-342-7796 1-800-772-1213 ? [place] Aging, Disability and Transit Services: Assists with nutrition, care and transit needs. 336-349-2343  Cancer Center Support Programs: @10RELATIVEDAYS@ > Cancer Support Group  2nd Tuesday of the month 1pm-2pm, Journey Room  > Creative Journey  3rd Tuesday of the month 1130am-1pm, Journey Room  > Look Good Feel Better  1st Wednesday of the month 10am-12 noon, Journey Room (Call American Cancer Society to register 1-800-395-5775)    

## 2016-10-27 NOTE — Patient Instructions (Signed)
[facility] at [facility] Discharge Instructions  RECOMMENDATIONS MADE BY THE CONSULTANT AND ANY TEST RESULTS WILL BE SENT TO YOUR REFERRING PHYSICIAN.  You had your port flushed today. Follow up as scheduled.  Thank you for choosing [facility] at [facility] to provide your oncology and hematology care.  To afford each patient quality time with our provider, please arrive at least 15 minutes before your scheduled appointment time.    If you have a lab appointment with the Cancer Center please come in thru the  Main Entrance and check in at the main information desk  You need to re-schedule your appointment should you arrive 10 or more minutes late.  We strive to give you quality time with our providers, and arriving late affects you and other patients whose appointments are after yours.  Also, if you no show three or more times for appointments you may be dismissed from the clinic at the providers discretion.     Again, thank you for choosing [facility].  Our hope is that these requests will decrease the amount of time that you wait before being seen by our physicians.       _____________________________________________________________  Should you have questions after your visit to [facility], please contact our office at [phone] between the hours of 8:30 a.m. and 4:30 p.m.  Voicemails left after 4:30 p.m. will not be returned until the following business day.  For prescription refill requests, have your pharmacy contact our office.       Resources For Cancer Patients and their Caregivers ? American Cancer Society: Can assist with transportation, wigs, general needs, runs Look Good Feel Better.        1-888-227-6333 ? Cancer Care: Provides financial assistance, online support groups, medication/co-pay assistance.  1-800-813-HOPE (4673) ? [facility] Assists [place] cancer  patients and their families through emotional , educational and financial support.  336-427-4357 ? [place] DSS Where to apply for food stamps, Medicaid and utility assistance. 336-342-1394 ? RC[facility]: Transportation to medical appointments. 336-347-2287 ? Social Security Administration: May apply for disability if have a Stage IV cancer. 336-342-7796 1-800-772-1213 ? [place] Aging, Disability and Transit Services: Assists with nutrition, care and transit needs. 336-349-2343  Cancer Center Support Programs: @10RELATIVEDAYS@ > Cancer Support Group  2nd Tuesday of the month 1pm-2pm, Journey Room  > Creative Journey  3rd Tuesday of the month 1130am-1pm, Journey Room  > Look Good Feel Better  1st Wednesday of the month 10am-12 noon, Journey Room (Call American Cancer Society to register 1-800-395-5775)    

## 2016-11-13 ENCOUNTER — Ambulatory Visit (INDEPENDENT_AMBULATORY_CARE_PROVIDER_SITE_OTHER): Payer: 59 | Admitting: Podiatry

## 2016-11-13 ENCOUNTER — Encounter: Payer: Self-pay | Admitting: Podiatry

## 2016-11-13 DIAGNOSIS — L600 Ingrowing nail: Secondary | ICD-10-CM | POA: Diagnosis not present

## 2016-11-13 NOTE — Progress Notes (Signed)
   Subjective: Patient presents today for evaluation of pain to the medial and lateral borders of the left third toe that began 3-4 days ago. She reports associated redness of the area. Patient is concerned for possible ingrown nail. Patient presents today for further treatment and evaluation.   Past Medical History:  Diagnosis Date  . Acute deep vein thrombosis (DVT) of popliteal vein of left lower extremity ([place]) 02/09/2016  . Arthritis    osteoarthritis  . Diabetes mellitus without complication ([place])   . History of radiation therapy 04/25/2016 - 05/23/2016   Left Axilla treated to 36 Gy in 20 fractions  . HIV infection ([place])   . 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 Human immunodeficiency virus (HIV) disease ([place]) 02/23/2006   Qualifier: Diagnosis of  By: Megan Salon MD, John      Objective:  General: Well developed, nourished, in no acute distress, alert and oriented x3   Dermatology: Skin is warm, dry and supple bilateral. Medial and lateral borders of the left third toe appear to be erythematous with evidence of an ingrowing nail. Pain on palpation noted to the border of the nail fold. The remaining nails appear unremarkable at this time. There are no open sores, lesions.  Vascular: Dorsalis Pedis artery and Posterior Tibial artery pedal pulses palpable. No lower extremity edema noted.   Neruologic: Grossly intact via light touch bilateral.  Musculoskeletal: Muscular strength within normal limits in all groups bilateral. Normal range of motion noted to all pedal and ankle joints.   Assesement: #1 Paronychia with ingrowing nail medial and lateral borders of the left third toe #2 Pain in toe #3 Incurvated nail  Plan of Care:  1. Patient evaluated.  2. Discussed treatment alternatives and plan of care. Explained nail avulsion procedure and post procedure course to patient. 3. Patient opted for conservative treatment.  4. Mechanical debridement of the left third  toenail performed using a nail nipper. Filed with dremel without incident.  5. Return to clinic in 4 weeks. If not better, will perform nail avulsion procedure.   Edrick Kins, DPM [facility]  Dr. Edrick Kins, [place]                                        [place], [place] [address]                Office [phone]  Fax [phone]

## 2016-11-23 ENCOUNTER — Encounter (HOSPITAL_COMMUNITY): Payer: 59 | Attending: Oncology

## 2016-11-23 ENCOUNTER — Ambulatory Visit (HOSPITAL_COMMUNITY)
Admission: RE | Admit: 2016-11-23 | Discharge: 2016-11-23 | Disposition: A | Discharge status: Home / Self Care | Payer: 59 | Source: Ambulatory Visit | Attending: Oncology | Admitting: Oncology

## 2016-11-23 DIAGNOSIS — C8190 Hodgkin lymphoma, unspecified, unspecified site: Secondary | ICD-10-CM | POA: Diagnosis not present

## 2016-11-23 DIAGNOSIS — C8114 Nodular sclerosis classical Hodgkin lymphoma, lymph nodes of axilla and upper limb: Secondary | ICD-10-CM

## 2016-11-23 DIAGNOSIS — D3502 Benign neoplasm of left adrenal gland: Secondary | ICD-10-CM | POA: Insufficient documentation

## 2016-11-23 DIAGNOSIS — I700 Atherosclerosis of aorta: Secondary | ICD-10-CM | POA: Insufficient documentation

## 2016-11-23 DIAGNOSIS — K439 Ventral hernia without obstruction or gangrene: Secondary | ICD-10-CM | POA: Insufficient documentation

## 2016-11-23 DIAGNOSIS — D3500 Benign neoplasm of unspecified adrenal gland: Secondary | ICD-10-CM | POA: Diagnosis not present

## 2016-11-23 LAB — CBC WITH DIFFERENTIAL/PLATELET
Basophils Absolute: 0 10*3/uL (ref 0.0–0.1)
Basophils Relative: 0 %
EOS ABS: 0.2 10*3/uL (ref 0.0–0.7)
EOS PCT: 2 %
HCT: 44.2 % (ref 36.0–46.0)
HEMOGLOBIN: 14.3 g/dL (ref 12.0–15.0)
LYMPHS ABS: 2.7 10*3/uL (ref 0.7–4.0)
Lymphocytes Relative: 35 %
MCH: 30.5 pg (ref 26.0–34.0)
MCHC: 32.4 g/dL (ref 30.0–36.0)
MCV: 94.2 fL (ref 78.0–100.0)
Monocytes Absolute: 0.7 10*3/uL (ref 0.1–1.0)
Monocytes Relative: 9 %
NEUTROS PCT: 54 %
Neutro Abs: 4.1 10*3/uL (ref 1.7–7.7)
Platelets: 246 10*3/uL (ref 150–400)
RBC: 4.69 MIL/uL (ref 3.87–5.11)
RDW: 13.9 % (ref 11.5–15.5)
WBC: 7.6 10*3/uL (ref 4.0–10.5)

## 2016-11-23 LAB — COMPREHENSIVE METABOLIC PANEL
ALK PHOS: 44 U/L (ref 38–126)
ALT: 94 U/L — ABNORMAL HIGH (ref 14–54)
AST: 87 U/L — ABNORMAL HIGH (ref 15–41)
Albumin: 4 g/dL (ref 3.5–5.0)
Anion gap: 8 (ref 5–15)
BILIRUBIN TOTAL: 0.5 mg/dL (ref 0.3–1.2)
BUN: 17 mg/dL (ref 6–20)
CALCIUM: 9.8 mg/dL (ref 8.9–10.3)
CO2: 31 mmol/L (ref 22–32)
Chloride: 102 mmol/L (ref 101–111)
Creatinine, Ser: 0.69 mg/dL (ref 0.44–1.00)
GFR calc non Af Amer: >60 mL/min (ref >60)
Glucose, Bld: 144 mg/dL — ABNORMAL HIGH (ref 65–99)
Potassium: 4.2 mmol/L (ref 3.5–5.1)
SODIUM: 141 mmol/L (ref 135–145)
TOTAL PROTEIN: 7.4 g/dL (ref 6.5–8.1)

## 2016-11-23 LAB — LACTATE DEHYDROGENASE: LDH: 162 U/L (ref 98–192)

## 2016-11-23 MED ORDER — IOPAMIDOL (ISOVUE-300) INJECTION 61%
125.0000 mL | Freq: Once | INTRAVENOUS | Status: AC | PRN
  Administered 2016-11-23: 125 mL via INTRAVENOUS

## 2016-11-23 NOTE — Progress Notes (Signed)
Please call patient with results of her CT scans. Essentially stable left axillary LN, no new lymphadenopathy.

## 2016-11-27 ENCOUNTER — Encounter (HOSPITAL_COMMUNITY): Payer: Self-pay | Admitting: Oncology

## 2016-11-27 ENCOUNTER — Encounter (HOSPITAL_BASED_OUTPATIENT_CLINIC_OR_DEPARTMENT_OTHER): Payer: 59 | Admitting: Oncology

## 2016-11-27 VITALS — BP 117/72 | HR 79 | Resp 18 | Ht 61.0 in | Wt 272.0 lb

## 2016-11-27 DIAGNOSIS — Z7901 Long term (current) use of anticoagulants: Secondary | ICD-10-CM

## 2016-11-27 DIAGNOSIS — C8118 Nodular sclerosis classical Hodgkin lymphoma, lymph nodes of multiple sites: Secondary | ICD-10-CM | POA: Diagnosis not present

## 2016-11-27 DIAGNOSIS — Z86718 Personal history of other venous thrombosis and embolism: Secondary | ICD-10-CM

## 2016-11-27 DIAGNOSIS — C8114 Nodular sclerosis classical Hodgkin lymphoma, lymph nodes of axilla and upper limb: Secondary | ICD-10-CM

## 2016-11-27 NOTE — Progress Notes (Signed)
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  Positive CD30, CD15, andPAX-5.  Negative CD45 and CD20.      08/31/2015 PET scan    Hypermetabolic lymphadenopathy. Nodal stations include: L supraclavicular nodes, L axillary nodes, mediastinal lymph nodes, periaortic retroperitoneal and periportal lymph nodes as well as L iliac and inguinal lymph nodes. Two concerning pulm nodules...      09/01/2015 Imaging    MUGA- Normal LEFT ventricular ejection fraction of 73%.  Normal LEFT ventricular wall motion.      09/01/2015 Procedure    PFTs-  Spirometry shows mild ventilatory defect without airflow obstruction and no bronchodilator improvement. Lung volumes are normal. Airway resistance is normal. DLCO normal. Essentially normal lung function      09/08/2015 Procedure    Port placed by Holly Stephens      09/13/2015 - 02/23/2016 Chemotherapy    ABVD      09/27/2015 Imaging    CT angio of chest- no gross PE seen. Improving axillary, mediastinal and upper abdominal adenopathy. Improved and possibly resolved RUL nodule and with resolved or obscured LLL nodule. Small L pleural effusion and mild L lower lobe atelectasis.      10/11/2015 Treatment Plan Change    Bleomycin discontinued       11/01/2015 PET scan    Clear interval response to therapy with decrease in size and hypermetabolism of larger lymph nodes seen previously and some of the smaller hypermetabolic lymph node seen on the previous  study have resolved completely in terms of abnormal enlargement and hypermetabolism. 2. Slight decrease in left lower lobe pulmonary nodule with interval resolution of the right upper lobe pulmonary nodule. 3. Stable appearance of mild heterogeneous FDG accumulation within the marrow space.      11/01/2015 Miscellaneous    Deauville 3- 4 more cycles of AVD recommended according to NCCN guidelines.      02/09/2016 Imaging    [place] left leg- Positive for acute DVT in left calf, popliteal and femoral veins.      03/22/2016 PET scan    1. Mild residual metabolic activity within LEFT axillary lymph nodes ( Deauville 2 ). 2. No additional hypermetabolic adenopathy identified. 3. Normal spleen and marrow activity.      04/25/2016 - 05/23/2016 Radiation Therapy    Residual disease treated with radiation Holly Stephens): Left axilla treated with 36 Gy in 20 fractions.      07/24/2016 PET scan    PET IMPRESSION: 1. Stable exam. The mild residual low level metabolic activity within left axillary lymph nodes a similar to prior (Deauville 2). 2. No new or progressive findings. 3. Abdominal aortic atherosclerosis.       11/23/2016 Imaging    CT C/A/P: 1. Essentially stable left axillary adenopathy. No new adenopathy identified. 2.  Aortic Atherosclerosis (ICD10-I70.0). 3. Small left adrenal adenoma. 4. Midline ventral hernia contains about 630 cc of omental adipose tissue.        HISTORY OF PRESENT ILLNESS:  (From Holly  Sheldon Silvan, PA-C's last note on 03/27/16)     INTERVAL HISTORY:  Holly Stephens 61 y.o. female returns for follow-up for nodular-sclerosing Hodgkin's lymphoma and review of restaging scans.  She states that she has been doing well. She states she has not palpated any new lymphadenopathy. She recently developed a new mouth sore on her lower lip in the last few days, she states she gets mouth sores infrequently. She continues to complain of fatigue. Otherwise ROS as below.  REVIEW OF  SYSTEMS:  Review of Systems  Constitutional: Positive for fatigue. Negative for chills and fever.  HENT:   Positive for mouth sores. Negative for nosebleeds.   Eyes: Negative.   Respiratory: Negative.  Negative for cough and shortness of breath.   Cardiovascular: Negative.  Negative for chest pain and leg swelling.  Gastrointestinal: Negative for abdominal pain, blood in stool, constipation, diarrhea, nausea and vomiting.  Endocrine: Positive for hot flashes.  Genitourinary: Negative.  Negative for dysuria and hematuria.   Musculoskeletal: Negative.  Negative for arthralgias.  Skin: Negative.   Neurological: Positive for numbness (in toes). Negative for dizziness and headaches.  Hematological: Negative.  Negative for adenopathy. Does not bruise/bleed easily.  Psychiatric/Behavioral: Negative.  Negative for depression and sleep disturbance. The patient is not nervous/anxious.      PAST MEDICAL/SURGICAL HISTORY:  Past Medical History:  Diagnosis Date  . Acute deep vein thrombosis (DVT) of popliteal vein of left lower extremity (Holly Stephens) 02/09/2016  . Arthritis    osteoarthritis  . Diabetes mellitus without complication ([place])   . History of radiation therapy 04/25/2016 - 05/23/2016   Left Axilla treated to 36 Gy in 20 fractions  . HIV infection (Holly Stephens)   . 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 Human immunodeficiency virus (HIV) disease (Holly Stephens) 02/23/2006   Qualifier: Diagnosis of  By: Holly Salon Stephens, Holly Stephens     Past Surgical History:  Procedure Laterality Date  . AXILLARY LYMPH NODE BIOPSY Left 07/23/2015   Procedure: EXCISIONAL BIOPSY LEFT AXILLARY LYMPH NODE;  Surgeon: Holly Stephens;  Location: [facility];  Service: General;  Laterality: Left;  . PORTACATH PLACEMENT Right 09/08/2015   Procedure: INSERTION OF CENTRAL VENOUS CATHETER WITH PORT FOR CHEMOTHERAPY;  Surgeon: Holly Stephens;  Location: [facility];  Service: General;  Laterality: Right;  . SHOULDER SURGERY Right     removal of bone spur     SOCIAL HISTORY:  Social History   Social History  . Marital status: Widowed    Spouse name: N/A  . Number of children: N/A  . Years of education: N/A   Occupational History  . Not on file.   Social History Main Topics  . Smoking status: Never Smoker  . Smokeless tobacco: Never Used  . Alcohol use No  . Drug use: No  . Sexual activity: No     Comment: declined condoms   Other Topics Concern  . Not on file   Social History Narrative  . No narrative on file    FAMILY HISTORY:  Family History  Problem Relation Age of Onset  . Hypertension Mother   . Cancer Mother 68       breast   . Hypertension Father   . Heart attack Father   . Pulmonary embolism Sister   . Parkinson's disease Brother     CURRENT MEDICATIONS:  Outpatient Encounter Prescriptions as of 11/27/2016  Medication Sig  . cholecalciferol (VITAMIN D) 1000 units tablet Take 2,000 Units by mouth daily.  Marland Kitchen  emollient (RADIAGEL) gel Apply topically as needed for wound care.  . fenofibrate 160 MG tablet Take 160 mg by mouth daily.   . fexofenadine (ALLEGRA) 180 MG tablet Take 180 mg by mouth daily.  . furosemide (LASIX) 20 MG tablet Take 1 tablet (20 mg total) by mouth daily.  . Garlic 0300 MG CAPS Take 1,000 mg by mouth daily.   Vanessa Kick Ethyl (VASCEPA) 1 g CAPS Take by mouth.  Marland Kitchen JANUMET 50-500 MG per tablet Take 1 tablet by mouth 2 (two) times daily with a meal.   . lidocaine-prilocaine (EMLA) cream Apply 1 application topically as needed (prior to accessing port).  . metoprolol tartrate (LOPRESSOR) 25 MG tablet Take 1 tablet (25 mg total) by mouth daily.  . Multiple Vitamin (MULTIVITAMIN WITH MINERALS) TABS tablet Take 1 tablet by mouth daily.  . ODEFSEY 200-25-25 MG TABS tablet TAKE ONE TABLET BY MOUTH ONCE DAILY WITH A MEAL. STORE IN ORIGINAL CONTAINER AT ROOM TEMPERATURE.  Marland Kitchen spironolactone (ALDACTONE) 25 MG tablet Take 25 mg by mouth daily.  Marland Kitchen TIVICAY 50 MG tablet TAKE ONE  TABLET (50 MG) BY MOUTH ONCE DAILY WITH A MEAL. STORE AT [facility].  Marland Kitchen UNABLE TO FIND Med Name: Theracurmin 60 mg po daily   No facility-administered encounter medications on file as of 11/27/2016.     ALLERGIES:  Allergies  Allergen Reactions  . Augmentin [Amoxicillin-Pot Clavulanate] Hives and Other (See Comments)    Has patient had a PCN reaction causing immediate rash, facial/tongue/throat swelling, SOB or lightheadedness with hypotension: No Has patient had a PCN reaction causing severe rash involving mucus membranes or skin necrosis: No Has patient had a PCN reaction that required hospitalization No Has patient had a PCN reaction occurring within the last 10 years: No If all of the above answers are "NO", then may proceed with Cephalosporin use.  . Bleomycin Cough     PHYSICAL EXAM:  ECOG Performance status: 1 - Symptomatic, but largely independent   Vitals:   11/27/16 1045  BP: 117/72  Pulse: 79  Resp: 18  SpO2: 97%   Filed Weights   11/27/16 1045  Weight: 272 lb (123.4 kg)    Physical Exam  Constitutional: She is oriented to person, place, and time and well-developed, well-nourished, and in no distress. No distress.  HENT:  Head: Normocephalic and atraumatic.  Mouth/Throat: Oropharynx is clear and moist. No oropharyngeal exudate.  Eyes: Pupils are equal, round, and reactive to light. Conjunctivae are normal. No scleral icterus.  Neck: Normal range of motion. Neck supple. No JVD present.  Cardiovascular: Normal rate, regular rhythm and normal heart sounds.  Exam reveals no gallop and no friction rub.   No murmur heard. Pulmonary/Chest: Effort normal and breath sounds normal. No respiratory distress. She has no wheezes. She has no rales.  Abdominal: Soft. Bowel sounds are normal. She exhibits no distension. There is no tenderness. There is no rebound and no guarding.  obese  Musculoskeletal: Normal range of motion. She exhibits no edema or tenderness.    Lymphadenopathy:    She has no cervical adenopathy.    She has no axillary adenopathy.       Right: No supraclavicular adenopathy present.       Left: No supraclavicular adenopathy present.  (  Neurological: She is alert and oriented to person, place, and time. No cranial nerve deficit. Gait normal.  Skin: Skin is warm and dry. No rash noted. No erythema. No pallor.  Psychiatric: Mood, memory, affect and judgment  normal.  Nursing note and vitals reviewed.    LABORATORY DATA:  I have reviewed the labs as listed.  CBC    Component Value Date/Time   WBC 7.6 11/23/2016 0854   RBC 4.69 11/23/2016 0854   HGB 14.3 11/23/2016 0854   HCT 44.2 11/23/2016 0854   PLT 246 11/23/2016 0854   MCV 94.2 11/23/2016 0854   MCH 30.5 11/23/2016 0854   MCHC 32.4 11/23/2016 0854   RDW 13.9 11/23/2016 0854   LYMPHSABS 2.7 11/23/2016 0854   MONOABS 0.7 11/23/2016 0854   EOSABS 0.2 11/23/2016 0854   BASOSABS 0.0 11/23/2016 0854   CMP Latest Ref Rng & Units 11/23/2016 10/27/2016 07/04/2016  Glucose 65 - 99 mg/dL 144(H) 93 157(H)  BUN 6 - 20 mg/dL '17 16 16  ' Creatinine 0.44 - 1.00 mg/dL 0.69 0.66 0.73  Sodium 135 - 145 mmol/L 141 142 139  Potassium 3.5 - 5.1 mmol/L 4.2 3.8 3.6  Chloride 101 - 111 mmol/L 102 101 100(L)  CO2 22 - 32 mmol/L '31 28 30  ' Calcium 8.9 - 10.3 mg/dL 9.8 10.1 9.9  Total Protein 6.5 - 8.1 g/dL 7.4 7.7 6.9  Total Bilirubin 0.3 - 1.2 mg/dL 0.5 0.6 0.5  Alkaline Phos 38 - 126 U/L 44 39 38  AST 15 - 41 U/L 87(H) 91(H) 44(H)  ALT 14 - 54 U/L 94(H) 101(H) 47    PENDING LABS:    DIAGNOSTIC IMAGING:  Most recent PET scan: 03/22/16    CT Abdomen Pelvis W Contrast (Accession [id number]) (Order [id number])  Imaging  Date: 11/23/2016 Department: [facility] Released By: Alanda Slim Authorizing: Twana First, Stephens  Exam Information   Status Exam Begun  Exam Ended   Final [99] 11/23/2016 10:04 AM 11/23/2016 10:47 AM  PACS Images   Show images for CT Abdomen Pelvis  W Contrast  Study Result   CLINICAL DATA:  Hodgkins lymphoma with axillary involvement. History of diabetes and HIV.  EXAM: CT CHEST, ABDOMEN, AND PELVIS WITH CONTRAST  TECHNIQUE: Multidetector CT imaging of the chest, abdomen and pelvis was performed following the standard protocol during bolus administration of intravenous contrast.  CONTRAST:  169m ISOVUE-300 IOPAMIDOL (ISOVUE-300) INJECTION 61%  COMPARISON:  07/24/2016  FINDINGS: CT CHEST FINDINGS  Cardiovascular: Mild atherosclerotic calcification of the aortic arch. Right Port-A-Cath tip: SVC.  Mediastinum/Nodes: Left axillary adenopathy, an index node on image 7/ 2 measures 1.6 cm in short axis, formerly the same by my measurement. An adjacent node measures 1.2 cm in short axis on image 6/2, formerly 1.3 cm. Subjectively the appearance is very similar prior.  An immediately subcutaneous lesion along the right upper back measures 1.9 by 1.3 cm and is similar to the prior PET-CT, where this structure was not hypermetabolic.  No mediastinal or hilar adenopathy. Small right axillary and subpectoral lymph nodes are not pathologically enlarged.  Lungs/Pleura: Mild lingular scarring, stable.  Musculoskeletal: Thoracic spondylosis.  CT ABDOMEN PELVIS FINDINGS  Hepatobiliary: Unremarkable  Pancreas: Unremarkable  Spleen: Unremarkable  Adrenals/Urinary Tract: 1.6 by 1.9 cm left adrenal mass, relative washout of 57% compatible with adenoma. The kidneys appear unremarkable.  Stomach/Bowel: Unremarkable  Vascular/Lymphatic: Aortoiliac atherosclerotic vascular disease. The small pelvic lymph nodes are not pathologically enlarged by size criteria.  Reproductive: Unremarkable  Other: No supplemental non-categorized findings.  Musculoskeletal: If midline ventral hernia contains omental adipose tissue. The herniated adipose tissue is about 630 cubic cm in volume.  Bilateral degenerative  hip spurring. Lower lumbar spondylosis and degenerative disc disease.  IMPRESSION: 1. Essentially stable left axillary adenopathy. No new adenopathy identified. 2.  Aortic Atherosclerosis (ICD10-I70.0). 3. Small left adrenal adenoma. 4. Midline ventral hernia contains about 630 cc of omental adipose tissue.   Electronically Signed   By: Van Clines M.D.   On: 11/23/2016 15:12     PATHOLOGY:  (L) axillary biopsy: 07/23/15    ASSESSMENT & PLAN:   Hodgkin's Lymphoma, nodular-sclerosing type involving multiple lymph nodes:  -CD30, CD15, & PAX-5 positive; s/p ABVD chemotherapy beginning on 09/13/15, with discontinuation of Bleomycin on 10/11/15.  Mid-course PET scan revealed Deauville 3 lymphadenopathy, so she received 4 additional cycles of AVD.  Chemo course was complicated by diastolic heart failure (followed by Dr. Haroldine Laws), in addition to HIV (followed by Dr. Megan Stephens with ID). Chemotherapy completed on 02/23/16. Post-treatment PET revealed mild residual (L) axillary adenopathy (Deauville 2), so she was referred for axillary radiation therapy.  -Completed XRT to left axilla on 05/23/16.  -Repeat PET demonstrates persistent activity in left axilla which is unchanged; however it is Deauville 2. Clinically there are no palpable lymph nodes in the left axilla. -I have reviewed her restaging CT C/A/P, which shows continued remission without evidence of new disease, she has stable left axillary lymphadenopathy. Plan to repeat her restaging scans with CTs at the 1 year mark in April 2019. -Port flush today and q8 weeks. Discussed keeping the port in for a full year prior to removing it if she remains in remission.Can potentially remove her port after her April 2019 visit, at which point, if she remains disease free, she can get her chemoport removed. -Labs on next visit, CBC, CMP, LDH.   NCCN guidelines for surveillance of Hodgkin's Lymphoma (Age >/= 24) (1.2017):  A. Complete  remission should be documented including reversion of PET to "negative" within 3 months following completion of therapy.  B. It is recommended that the patient be provided with a treatment summary at the completion of his/her therapy, including details of radiation therapy, organs at risk, and cumulative anthracycline dosage given.  C. Follow-up with an oncologist is recommended, especially during the first 5 years after treatment to detect recurrence, and then annually due to the risk of late complications including second cancers and cardiovascular disease. Late relapse or transformation to large cell lymphoma may occur in [place].  D. Frequency and types of tests may vary depending on clinical circumstances: age and stage at diagnosis, social habits, treatment modality, etc. There are few data to support specific recommendations; these represent the range of practice at [facility].   1. Follow-up after completion of treatment up to 5 years:   A. Interim H and P: Every 3-6 months for 1-2 years, then every 6-12 months until year 3, then annually.   B. Annual influenza vaccine.   C. Laboratory studies:    1. CBC, platelets, ESR (if elevated at time of initial diagnosis), chemistry profile as clinically indicated.    2. Thyroid stimulating hormone (TSH) at least annually if radiation therapy to neck.   D. Acceptable to obtain a neck/chest/abdomen/pelvis CT scan with contrast, at 6, 12, and 24 month following completion of therapy, or as clinically indicated. PET/CT only if last PET was Deauville 4-5, to confirm complete response.   E. Counseling: Reproduction, health habits, psychosocial, cardiovascular, breast self-exam, skin cancer risk, end of treatment discussion.   F. Surveillance PET should not be done routinely due to risk of false positives. Management decision should not be based on PET scan alone; clinical  or pathologic correlation is needed.   2. Follow-up and monitoring after 5  years:   A. Interim H&P: Annually    1. Annual blood pressure, aggressive management of cardiovascular risk factors.    2. Pneumococcal, meningococcal, and H-flu revaccination after 5-7 years, if patient treated with splenic radiation therapy or previous splenectomy (according to CBC recommendations).    3. Annual influenza vaccine.   B. Cardiovascular symptoms may emerge at a young age.    1. Consider stress test/echocardiogram at 10 year intervals after treatment is completed.    2. Consider carotid ultrasound at 10 year intervals if neck irradiation.   C. Laboratory studies:    1. CBC, platelets, chemistry profile annually    2. TSH at least annually if radiation therapy to neck    3. Biannual lipids    4. Annual fasting glucose   D. Annual breast screening: Initiate 8-10 years post therapy, or at age 12, whichever comes first, if chest or axillary radiation. The NCCN [name] Guidelines Panel recommends breast MRI in addition to mammography for women who've received irradiation to the chest between ages 58-30 years, which is consistent with the [place] (ACS) Guidelines. Consider referral to a breast specialist.   E. Perform other routine surveillance tests for cervical, colorectal, endometrial, lung, and prostate cancer as per the ACS Cancer Screening Guidelines.   F. Counseling: Reproduction, health habits, psychosocial, cardiovascular, breast self-exam, and skin cancer risk.   G. Treatment summary and consideration of transfer to primary care provider.   H. Consider a referral to a survivorship clinic.    HIV:  -Under excellent control per Infectious Diseases. Continue current anti-viral regimen.  -Maintain follow-up with Dr. Megan Stephens, as clinically indicated.    Dispo:  RTC in 3 month for follow up with labs prior to next visit.  Orders Placed This Encounter  Procedures  . CBC with Differential    Standing Status:   Future    Standing Expiration Date:    11/27/2017  . Comprehensive metabolic panel    Standing Status:   Future    Standing Expiration Date:   11/27/2017  . Lactate dehydrogenase    Standing Status:   Future    Standing Expiration Date:   11/27/2017    All questions were answered to patient's stated satisfaction. Encouraged patient to call with any new concerns or questions before her next visit to the cancer center and we can certain see her sooner, if needed.     Twana First, Stephens

## 2016-12-11 ENCOUNTER — Ambulatory Visit: Payer: 59 | Admitting: Podiatry

## 2016-12-14 DIAGNOSIS — E782 Mixed hyperlipidemia: Secondary | ICD-10-CM | POA: Diagnosis not present

## 2016-12-14 DIAGNOSIS — E119 Type 2 diabetes mellitus without complications: Secondary | ICD-10-CM | POA: Diagnosis not present

## 2016-12-18 DIAGNOSIS — E119 Type 2 diabetes mellitus without complications: Secondary | ICD-10-CM | POA: Diagnosis not present

## 2016-12-18 DIAGNOSIS — E782 Mixed hyperlipidemia: Secondary | ICD-10-CM | POA: Diagnosis not present

## 2016-12-22 ENCOUNTER — Encounter (HOSPITAL_COMMUNITY): Payer: 59 | Attending: Hematology & Oncology

## 2016-12-22 ENCOUNTER — Encounter (HOSPITAL_COMMUNITY): Payer: Self-pay

## 2016-12-22 VITALS — BP 124/61 | HR 81 | Temp 97.7°F | Resp 20

## 2016-12-22 DIAGNOSIS — Z452 Encounter for adjustment and management of vascular access device: Secondary | ICD-10-CM | POA: Diagnosis not present

## 2016-12-22 DIAGNOSIS — Z95828 Presence of other vascular implants and grafts: Secondary | ICD-10-CM

## 2016-12-22 DIAGNOSIS — C8118 Nodular sclerosis classical Hodgkin lymphoma, lymph nodes of multiple sites: Secondary | ICD-10-CM | POA: Diagnosis not present

## 2016-12-22 MED ORDER — SODIUM CHLORIDE 0.9% FLUSH
10.0000 mL | INTRAVENOUS | Status: DC | PRN
Start: 2016-12-22 — End: 2016-12-22
  Administered 2016-12-22: 10 mL via INTRAVENOUS
  Filled 2016-12-22: qty 10

## 2016-12-22 MED ORDER — HEPARIN SOD (PORK) LOCK FLUSH 100 UNIT/ML IV SOLN
500.0000 [IU] | Freq: Once | INTRAVENOUS | Status: AC
  Administered 2016-12-22: 500 [IU] via INTRAVENOUS

## 2016-12-22 NOTE — Patient Instructions (Signed)
[facility] at [facility] Discharge Instructions  RECOMMENDATIONS MADE BY THE CONSULTANT AND ANY TEST RESULTS WILL BE SENT TO YOUR REFERRING PHYSICIAN.  Portacath flushed per protocol today. Follow-up as scheduled. Call clinic for any questions or concerns  Thank you for choosing [facility] at [facility] to provide your oncology and hematology care.  To afford each patient quality time with our provider, please arrive at least 15 minutes before your scheduled appointment time.    If you have a lab appointment with the Cancer Center please come in thru the  Main Entrance and check in at the main information desk  You need to re-schedule your appointment should you arrive 10 or more minutes late.  We strive to give you quality time with our providers, and arriving late affects you and other patients whose appointments are after yours.  Also, if you no show three or more times for appointments you may be dismissed from the clinic at the providers discretion.     Again, thank you for choosing [facility].  Our hope is that these requests will decrease the amount of time that you wait before being seen by our physicians.       _____________________________________________________________  Should you have questions after your visit to [facility], please contact our office at [phone] between the hours of 8:30 a.m. and 4:30 p.m.  Voicemails left after 4:30 p.m. will not be returned until the following business day.  For prescription refill requests, have your pharmacy contact our office.       Resources For Cancer Patients and their Caregivers ? [employer]: Can assist with transportation, wigs, general needs, runs Look Good Feel Better.        1-888-227-6333 ? Cancer Care: Provides financial assistance, online support groups, medication/co-pay assistance.  1-800-813-HOPE (4673) ? [facility] Assists [place] cancer patients and their families through emotional , educational and financial support.  336-427-4357 ? [place] DSS Where to apply for food stamps, Medicaid and utility assistance. 336-342-1394 ? [facility]: Transportation to medical appointments. 336-347-2287 ? Social Security Administration: May apply for disability if have a Stage IV cancer. 336-342-7796 1-800-772-1213 ? [place] [facility], Dis[facility] and Transit Services: Assists with nutrition, care and transit needs. 336-349-2343  Cancer Center Support Programs: [email or web address]@ > Cancer Support Group  2nd Tuesday of the month 1pm-2pm, Journey Room  > Creative Journey  3rd Tuesday of the month 1130am-1pm, Journey Room  > Look Good Feel Better  1st Wednesday of the month 10am-12 noon, Journey Room (Call [employer] to register 1-800-395-5775)   

## 2016-12-22 NOTE — Progress Notes (Signed)
Holly Stephens tolerated portacath flush well without complaints or incident. Port accessed with 20 gauge needle with blood return noted then flushed with 10 ml NS and 5 ml Heparin easily per protocol then de-accessed. VSS Pt discharged self ambulatory in satisfactory condition

## 2016-12-25 DIAGNOSIS — E119 Type 2 diabetes mellitus without complications: Secondary | ICD-10-CM | POA: Diagnosis not present

## 2017-01-08 ENCOUNTER — Other Ambulatory Visit (HOSPITAL_COMMUNITY): Payer: Self-pay | Admitting: Internal Medicine

## 2017-01-22 ENCOUNTER — Other Ambulatory Visit: Payer: Self-pay | Admitting: Internal Medicine

## 2017-01-22 DIAGNOSIS — B20 Human immunodeficiency virus [HIV] disease: Secondary | ICD-10-CM

## 2017-02-16 DIAGNOSIS — M25531 Pain in right wrist: Secondary | ICD-10-CM | POA: Diagnosis not present

## 2017-02-23 ENCOUNTER — Other Ambulatory Visit (HOSPITAL_COMMUNITY): Payer: Self-pay | Admitting: *Deleted

## 2017-02-28 ENCOUNTER — Inpatient Hospital Stay (HOSPITAL_COMMUNITY): Payer: 59 | Attending: Oncology

## 2017-02-28 ENCOUNTER — Other Ambulatory Visit: Payer: Self-pay

## 2017-02-28 ENCOUNTER — Inpatient Hospital Stay (HOSPITAL_BASED_OUTPATIENT_CLINIC_OR_DEPARTMENT_OTHER): Payer: 59 | Admitting: Oncology

## 2017-02-28 ENCOUNTER — Ambulatory Visit (HOSPITAL_COMMUNITY): Payer: 59 | Admitting: Adult Health

## 2017-02-28 ENCOUNTER — Encounter (HOSPITAL_COMMUNITY): Payer: Self-pay | Admitting: Oncology

## 2017-02-28 VITALS — BP 126/57 | HR 70 | Temp 97.3°F | Resp 18 | Wt 280.6 lb

## 2017-02-28 DIAGNOSIS — C8118 Nodular sclerosis classical Hodgkin lymphoma, lymph nodes of multiple sites: Secondary | ICD-10-CM | POA: Insufficient documentation

## 2017-02-28 DIAGNOSIS — B20 Human immunodeficiency virus [HIV] disease: Secondary | ICD-10-CM

## 2017-02-28 DIAGNOSIS — Z86718 Personal history of other venous thrombosis and embolism: Secondary | ICD-10-CM | POA: Insufficient documentation

## 2017-02-28 DIAGNOSIS — Z803 Family history of malignant neoplasm of breast: Secondary | ICD-10-CM | POA: Diagnosis not present

## 2017-02-28 DIAGNOSIS — C8114 Nodular sclerosis classical Hodgkin lymphoma, lymph nodes of axilla and upper limb: Secondary | ICD-10-CM

## 2017-02-28 DIAGNOSIS — Z923 Personal history of irradiation: Secondary | ICD-10-CM | POA: Diagnosis not present

## 2017-02-28 LAB — CBC WITH DIFFERENTIAL/PLATELET
BASOS ABS: 0 10*3/uL (ref 0.0–0.1)
Basophils Relative: 0 %
Eosinophils Absolute: 0.2 10*3/uL (ref 0.0–0.7)
Eosinophils Relative: 2 %
HEMATOCRIT: 42 % (ref 36.0–46.0)
Hemoglobin: 13.6 g/dL (ref 12.0–15.0)
LYMPHS PCT: 30 %
Lymphs Abs: 2.5 10*3/uL (ref 0.7–4.0)
MCH: 30.6 pg (ref 26.0–34.0)
MCHC: 32.4 g/dL (ref 30.0–36.0)
MCV: 94.4 fL (ref 78.0–100.0)
MONO ABS: 0.6 10*3/uL (ref 0.1–1.0)
Monocytes Relative: 7 %
NEUTROS ABS: 5 10*3/uL (ref 1.7–7.7)
Neutrophils Relative %: 61 %
Platelets: 228 10*3/uL (ref 150–400)
RBC: 4.45 MIL/uL (ref 3.87–5.11)
RDW: 13.7 % (ref 11.5–15.5)
WBC: 8.3 10*3/uL (ref 4.0–10.5)

## 2017-02-28 LAB — COMPREHENSIVE METABOLIC PANEL
ALBUMIN: 3.9 g/dL (ref 3.5–5.0)
ALT: 103 U/L — ABNORMAL HIGH (ref 14–54)
ANION GAP: 11 (ref 5–15)
AST: 114 U/L — ABNORMAL HIGH (ref 15–41)
Alkaline Phosphatase: 46 U/L (ref 38–126)
BILIRUBIN TOTAL: 0.3 mg/dL (ref 0.3–1.2)
BUN: 13 mg/dL (ref 6–20)
CO2: 26 mmol/L (ref 22–32)
Calcium: 9.9 mg/dL (ref 8.9–10.3)
Chloride: 101 mmol/L (ref 101–111)
Creatinine, Ser: 0.61 mg/dL (ref 0.44–1.00)
GFR calc Af Amer: >60 mL/min (ref >60)
GFR calc non Af Amer: >60 mL/min (ref >60)
GLUCOSE: 152 mg/dL — AB (ref 65–99)
POTASSIUM: 3.6 mmol/L (ref 3.5–5.1)
SODIUM: 138 mmol/L (ref 135–145)
TOTAL PROTEIN: 7 g/dL (ref 6.5–8.1)

## 2017-02-28 LAB — LACTATE DEHYDROGENASE: LDH: 164 U/L (ref 98–192)

## 2017-02-28 MED ORDER — HEPARIN SOD (PORK) LOCK FLUSH 100 UNIT/ML IV SOLN
INTRAVENOUS | Status: AC
  Filled 2017-02-28: qty 5

## 2017-02-28 MED ORDER — HEPARIN SOD (PORK) LOCK FLUSH 100 UNIT/ML IV SOLN
500.0000 [IU] | Freq: Once | INTRAVENOUS | Status: AC
  Administered 2017-02-28: 500 [IU] via INTRAVENOUS

## 2017-02-28 MED ORDER — SODIUM CHLORIDE 0.9% FLUSH
10.0000 mL | INTRAVENOUS | Status: DC | PRN
  Administered 2017-02-28: 10 mL via INTRAVENOUS
  Filled 2017-02-28: qty 10

## 2017-02-28 NOTE — Progress Notes (Signed)
Holly Stephens presented for Portacath access and flush. Portacath located rt chest wall accessed with  H 20 needle. Good blood return present. Portacath flushed with 20ml NS and 500U/5ml Heparin and needle removed intact. Procedure without incident. Patient tolerated procedure well.   

## 2017-02-28 NOTE — Progress Notes (Signed)
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  Positive CD30, CD15, andPAX-5.  Negative CD45 and CD20.      08/31/2015 PET scan    Hypermetabolic lymphadenopathy. Nodal stations include: L supraclavicular nodes, L axillary nodes, mediastinal lymph nodes, periaortic retroperitoneal and periportal lymph nodes as well as L iliac and inguinal lymph nodes. Two concerning pulm nodules...      09/01/2015 Imaging    MUGA- Normal LEFT ventricular ejection fraction of 73%.  Normal LEFT ventricular wall motion.      09/01/2015 Procedure    PFTs-  Spirometry shows mild ventilatory defect without airflow obstruction and no bronchodilator improvement. Lung volumes are normal. Airway resistance is normal. DLCO normal. Essentially normal lung function      09/08/2015 Procedure    Port placed by Dr. Rosana Hoes      09/13/2015 - 02/23/2016 Chemotherapy    ABVD      09/27/2015 Imaging    CT angio of chest- no gross PE seen. Improving axillary, mediastinal and upper abdominal adenopathy. Improved and possibly resolved RUL nodule and with resolved or obscured LLL nodule. Small L pleural effusion and mild L lower lobe atelectasis.      10/11/2015 Treatment Plan Change    Bleomycin discontinued       11/01/2015 PET scan    Clear interval response to therapy with decrease in size and hypermetabolism of larger lymph nodes seen previously and some of the smaller hypermetabolic lymph node seen on the previous  study have resolved completely in terms of abnormal enlargement and hypermetabolism. 2. Slight decrease in left lower lobe pulmonary nodule with interval resolution of the right upper lobe pulmonary nodule. 3. Stable appearance of mild heterogeneous FDG accumulation within the marrow space.      11/01/2015 Miscellaneous    Deauville 3- 4 more cycles of AVD recommended according to NCCN guidelines.      02/09/2016 Imaging    [place] left leg- Positive for acute DVT in left calf, popliteal and femoral veins.      03/22/2016 PET scan    1. Mild residual metabolic activity within LEFT axillary lymph nodes ( Deauville 2 ). 2. No additional hypermetabolic adenopathy identified. 3. Normal spleen and marrow activity.      04/25/2016 - 05/23/2016 Radiation Therapy    Residual disease treated with radiation Isidore Moos): Left axilla treated with 36 Gy in 20 fractions.      07/24/2016 PET scan    PET IMPRESSION: 1. Stable exam. The mild residual low level metabolic activity within left axillary lymph nodes a similar to prior (Deauville 2). 2. No new or progressive findings. 3. Abdominal aortic atherosclerosis.       11/23/2016 Imaging    CT C/A/P: 1. Essentially stable left axillary adenopathy. No new adenopathy identified. 2.  Aortic Atherosclerosis (ICD10-I70.0). 3. Small left adrenal adenoma. 4. Midline ventral hernia contains about 630 cc of omental adipose tissue.        HISTORY OF PRESENT ILLNESS:  (From  [name], PA-C's last note on 03/27/16)     INTERVAL HISTORY:   Patient returns for follow-up.  She was last seen by Dr. Talbert Cage in October where they reviewed her most recent restaging scans.  Scan showed stable disease.  Patient was told to return in 4 months for continued labs and follow-up and then in April for additional restaging scans.  Patient states she is doing well and denies any B symptoms.  More specifically she denies night sweats, weight loss or appetite changes. She has  not noticed any new palpable adenopathy.  She continues to note occasional leg swelling relieved with rest and elevation, occasional numbness in bilateral feet and toes and chronic right knee pain which she rates a 3 out of 10 today.  Her appetite has been 100% and energy level 75%.  Mouth sores that were previously mentioned have since resolved.  REVIEW OF SYSTEMS:  Review of Systems  Constitutional: Positive for fatigue. Negative for chills and fever.  HENT:   Negative for mouth sores and nosebleeds.   Eyes: Negative.   Respiratory: Negative.  Negative for cough and shortness of breath.   Cardiovascular: Positive for leg swelling. Negative for chest pain.       On occasion: Relieved with rest and elevation  Gastrointestinal: Negative for abdominal pain, blood in stool, constipation, diarrhea, nausea and vomiting.  Endocrine: Positive for hot flashes.  Genitourinary: Negative.  Negative for dysuria and hematuria.   Musculoskeletal: Positive for myalgias. Negative for arthralgias.       Joint pain: Right knee  Skin: Negative.   Neurological: Positive for numbness (in toes). Negative for dizziness and headaches.  Hematological: Negative.  Negative for adenopathy. Does not bruise/bleed easily.  Psychiatric/Behavioral: Negative.  Negative for depression and sleep disturbance. The patient is not nervous/anxious.      PAST MEDICAL/SURGICAL HISTORY:  Past Medical History:  Diagnosis Date  . Acute deep vein thrombosis (DVT) of popliteal vein of left lower extremity ([place]) 02/09/2016  . Arthritis    osteoarthritis  . Diabetes mellitus without complication ([place])   . History of radiation therapy 04/25/2016 - 05/23/2016   Left Axilla treated to 36 Gy in 20 fractions  . HIV infection ([place])   . [name], nodular sclerosis ([place]) 08/23/2015  . Human immunodeficiency virus (HIV) disease ([place]) 02/23/2006   Qualifier: Diagnosis of  By: Megan Salon MD, John     Past Surgical History:  Procedure  Laterality Date  . AXILLARY LYMPH NODE BIOPSY Left 07/23/2015   Procedure: EXCISIONAL BIOPSY LEFT AXILLARY LYMPH NODE;  Surgeon: [name], MD;  Location: [facility];  Service: General;  Laterality: Left;  . PORTACATH PLACEMENT Right 09/08/2015   Procedure: INSERTION OF CENTRAL VENOUS CATHETER WITH PORT FOR CHEMOTHERAPY;  Surgeon: [name], MD;  Location: [facility];  Service: General;  Laterality: Right;  . SHOULDER SURGERY Right    removal of bone spur     SOCIAL HISTORY:  Social History   Socioeconomic History  . Marital status: Widowed    Spouse name: Not on file  . Number of children: Not on file  . Years of education: Not on file  . Highest education level: Not on file  Social Needs  . Financial resource strain: Not on file  . Food insecurity - worry: Not on file  . Food insecurity - inability: Not on file  . Transportation needs - medical: Not on file  . Transportation needs - non-medical: Not on file  Occupational History  .  Not on file  Tobacco Use  . Smoking status: Never Smoker  . Smokeless tobacco: Never Used  Substance and Sexual Activity  . Alcohol use: No    Alcohol/week: 0.0 oz  . Drug use: No  . Sexual activity: No    Comment: declined condoms  Other Topics Concern  . Not on file  Social History Narrative  . Not on file    FAMILY HISTORY:  Family History  Problem Relation Age of Onset  . Hypertension Mother   . Cancer Mother 92       breast   . Hypertension Father   . Heart attack Father   . Pulmonary embolism Sister   . Parkinson's disease Brother     CURRENT MEDICATIONS:  Outpatient Encounter Medications as of 02/28/2017  Medication Sig  . cholecalciferol (VITAMIN D) 1000 units tablet Take 2,000 Units by mouth daily.  Marland Kitchen emollient (RADIAGEL) gel Apply topically as needed for wound care.  . fenofibrate 160 MG tablet Take 160 mg by mouth daily.   . fexofenadine (ALLEGRA) 180 MG tablet Take 180 mg by mouth daily as needed.   . furosemide  (LASIX) 20 MG tablet Take 1 tablet (20 mg total) by mouth daily.  . Garlic 4967 MG CAPS Take 1,000 mg by mouth daily as needed.   Vanessa Kick Ethyl (VASCEPA) 1 g CAPS Take by mouth.  Marland Kitchen JANUMET XR [phone] MG TB24 Take 1 tablet by mouth 2 (two) times daily.  Marland Kitchen lidocaine-prilocaine (EMLA) cream Apply 1 application topically as needed (prior to accessing port).  . metoprolol tartrate (LOPRESSOR) 25 MG tablet Take 1 tablet (25 mg total) by mouth daily.  . Multiple Vitamin (MULTIVITAMIN WITH MINERALS) TABS tablet Take 1 tablet by mouth daily.  . ODEFSEY 200-25-25 MG TABS tablet TAKE ONE TABLET BY MOUTH ONCE DAILY WITH A MEAL. STORE IN ORIGINAL CONTAINER AT ROOM TEMPERATURE.  Marland Kitchen spironolactone (ALDACTONE) 25 MG tablet TAKE 1 TABLET DAILY  . TIVICAY 50 MG tablet TAKE ONE TABLET (50 MG) BY MOUTH ONCE DAILY WITH A MEAL. STORE AT [facility].  Marland Kitchen UNABLE TO FIND Med Name: Theracurmin 60 mg po daily  . [DISCONTINUED] JANUMET 50-500 MG per tablet Take 1 tablet by mouth 2 (two) times daily with a meal.    No facility-administered encounter medications on file as of 02/28/2017.     ALLERGIES:  Allergies  Allergen Reactions  . Augmentin [Amoxicillin-Pot Clavulanate] Hives and Other (See Comments)    Has patient had a PCN reaction causing immediate rash, facial/tongue/throat swelling, SOB or lightheadedness with hypotension: No Has patient had a PCN reaction causing severe rash involving mucus membranes or skin necrosis: No Has patient had a PCN reaction that required hospitalization No Has patient had a PCN reaction occurring within the last 10 years: No If all of the above answers are "NO", then may proceed with Cephalosporin use.  . Bleomycin Cough     PHYSICAL EXAM:  ECOG Performance status: 1 - Symptomatic, but largely independent   Vitals:   02/28/17 1049  BP: (!) 126/57  Pulse: 70  Resp: 18  Temp: (!) 97.3 F (36.3 C)  SpO2: 98%   Filed Weights   02/28/17 1049  Weight: 280 lb 9.6 oz  (127.3 kg)    Physical Exam  Constitutional: She is oriented to person, place, and time and well-developed, well-nourished, and in no distress. Vital signs are normal. No distress.  Patient is obese.  She notes recent weight gain.  HENT:  Head: Normocephalic and  atraumatic.  Mouth/Throat: Oropharynx is clear and moist. No oropharyngeal exudate.  Eyes: Conjunctivae are normal. Pupils are equal, round, and reactive to light. No scleral icterus.  Neck: Normal range of motion. Neck supple. No JVD present.  Cardiovascular: Normal rate, regular rhythm and normal heart sounds. Exam reveals no gallop and no friction rub.  No murmur heard. Pulmonary/Chest: Effort normal and breath sounds normal. No respiratory distress. She has no wheezes. She has no rales.  Abdominal: Soft. Bowel sounds are normal. She exhibits no distension. There is no tenderness. There is no rebound and no guarding.  obese  Musculoskeletal: Normal range of motion. She exhibits no edema or tenderness.       Right ankle: She exhibits swelling.       Left ankle: She exhibits swelling.  Bilateral nonpitting edema  Lymphadenopathy:    She has no cervical adenopathy.    She has no axillary adenopathy.       Right: No supraclavicular adenopathy present.       Left: No supraclavicular adenopathy present.  No palpable adenopathy  Neurological: She is alert and oriented to person, place, and time. No cranial nerve deficit. Gait normal.  Skin: Skin is warm and dry. No rash noted. No erythema. No pallor.  Psychiatric: Mood, memory, affect and judgment normal.  Nursing note and vitals reviewed.    LABORATORY DATA:  I have reviewed the labs as listed.  CBC    Component Value Date/Time   WBC 8.3 02/28/2017 1100   RBC 4.45 02/28/2017 1100   HGB 13.6 02/28/2017 1100   HCT 42.0 02/28/2017 1100   PLT 228 02/28/2017 1100   MCV 94.4 02/28/2017 1100   MCH 30.6 02/28/2017 1100   MCHC 32.4 02/28/2017 1100   RDW 13.7 02/28/2017 1100     LYMPHSABS 2.5 02/28/2017 1100   MONOABS 0.6 02/28/2017 1100   EOSABS 0.2 02/28/2017 1100   BASOSABS 0.0 02/28/2017 1100   CMP Latest Ref Rng & Units 02/28/2017 11/23/2016 10/27/2016  Glucose 65 - 99 mg/dL 152(H) 144(H) 93  BUN 6 - 20 mg/dL '13 17 16  ' Creatinine 0.44 - 1.00 mg/dL 0.61 0.69 0.66  Sodium 135 - 145 mmol/L 138 141 142  Potassium 3.5 - 5.1 mmol/L 3.6 4.2 3.8  Chloride 101 - 111 mmol/L 101 102 101  CO2 22 - 32 mmol/L '26 31 28  ' Calcium 8.9 - 10.3 mg/dL 9.9 9.8 10.1  Total Protein 6.5 - 8.1 g/dL 7.0 7.4 7.7  Total Bilirubin 0.3 - 1.2 mg/dL 0.3 0.5 0.6  Alkaline Phos 38 - 126 U/L 46 44 39  AST 15 - 41 U/L 114(H) 87(H) 91(H)  ALT 14 - 54 U/L 103(H) 94(H) 101(H)    PENDING LABS:    DIAGNOSTIC IMAGING:  Most recent PET scan: 03/22/16    CT Abdomen Pelvis W Contrast (Accession [id number]) (Order [id number])  Imaging  Date: 11/23/2016 Department: [facility] Released By: Alanda Slim Authorizing: Twana First, MD  Exam Information   Status Exam Begun  Exam Ended   Final [99] 11/23/2016 10:04 AM 11/23/2016 10:47 AM  PACS Images   Show images for CT Abdomen Pelvis W Contrast  Study Result   CLINICAL DATA:  Hodgkins lymphoma with axillary involvement. History of diabetes and HIV.  EXAM: CT CHEST, ABDOMEN, AND PELVIS WITH CONTRAST  TECHNIQUE: Multidetector CT imaging of the chest, abdomen and pelvis was performed following the standard protocol during bolus administration of intravenous contrast.  CONTRAST:  138m ISOVUE-300 IOPAMIDOL (ISOVUE-300)  INJECTION 61%  COMPARISON:  07/24/2016  FINDINGS: CT CHEST FINDINGS  Cardiovascular: Mild atherosclerotic calcification of the aortic arch. Right Port-A-Cath tip: SVC.  Mediastinum/Nodes: Left axillary adenopathy, an index node on image 7/ 2 measures 1.6 cm in short axis, formerly the same by my measurement. An adjacent node measures 1.2 cm in short axis on image 6/2, formerly 1.3 cm.  Subjectively the appearance is very similar prior.  An immediately subcutaneous lesion along the right upper back measures 1.9 by 1.3 cm and is similar to the prior PET-CT, where this structure was not hypermetabolic.  No mediastinal or hilar adenopathy. Small right axillary and subpectoral lymph nodes are not pathologically enlarged.  Lungs/Pleura: Mild lingular scarring, stable.  Musculoskeletal: Thoracic spondylosis.  CT ABDOMEN PELVIS FINDINGS  Hepatobiliary: Unremarkable  Pancreas: Unremarkable  Spleen: Unremarkable  Adrenals/Urinary Tract: 1.6 by 1.9 cm left adrenal mass, relative washout of 57% compatible with adenoma. The kidneys appear unremarkable.  Stomach/Bowel: Unremarkable  Vascular/Lymphatic: Aortoiliac atherosclerotic vascular disease. The small pelvic lymph nodes are not pathologically enlarged by size criteria.  Reproductive: Unremarkable  Other: No supplemental non-categorized findings.  Musculoskeletal: If midline ventral hernia contains omental adipose tissue. The herniated adipose tissue is about 630 cubic cm in volume.  Bilateral degenerative hip spurring. Lower lumbar spondylosis and degenerative disc disease.  IMPRESSION: 1. Essentially stable left axillary adenopathy. No new adenopathy identified. 2.  Aortic Atherosclerosis (ICD10-I70.0). 3. Small left adrenal adenoma. 4. Midline ventral hernia contains about 630 cc of omental adipose tissue.   Electronically Signed   By: Van Clines M.D.   On: 11/23/2016 15:12     PATHOLOGY:  (L) axillary biopsy: 07/23/15    ASSESSMENT & PLAN:   Hodgkin's Lymphoma, nodular-sclerosing type involving multiple lymph nodes:  -CD30, CD15, & PAX-5 positive; s/p ABVD chemotherapy beginning on 09/13/15, with discontinuation of Bleomycin on 10/11/15.  Mid-course PET scan revealed Deauville 3 lymphadenopathy, so she received 4 additional cycles of AVD.  Chemo course was  complicated by diastolic heart failure (followed by Dr. Haroldine Laws), in addition to HIV (followed by Dr. Megan Salon with ID). Chemotherapy completed on 02/23/16. Post-treatment PET revealed mild residual (L) axillary adenopathy so she was referred for axillary radiation therapy.  -Completed XRT to left axilla on 05/23/16.  -Repeat PET demonstrates persistent activity in left axilla which is unchanged.  No evidence of adenopathy in the left axilla.  -Restaging CT scans were reviewed in detail during last visit.  It was recommended repeat CTs in April 2019.  Orders placed today. -Patient will continue port flush every 6-8 weeks.  It was discussed at previous visit that her Port-A-Cath could potentially be removed after 1 full year of remission.  -Labs on next visit.  Labs today look stable. LDH remains normal.  -Patient counseled of recurrent side effects and to call us if she has any concerns.   NCCN guidelines for surveillance of Hodgkin's Lymphoma (Age >/= 61) (1.2017):  A. Complete remission should be documented including reversion of PET to "negative" within 3 months following completion of therapy.  B. It is recommended that the patient be provided with a treatment summary at the completion of his/her therapy, including details of radiation therapy, organs at risk, and cumulative anthracycline dosage given.  C. Follow-up with an oncologist is recommended, especially during the first 5 years after treatment to detect recurrence, and then annually due to the risk of late complications including second cancers and cardiovascular disease. Late relapse or transformation to large cell lymphoma may occur in  [facility].  D. Frequency and types of tests may vary depending on clinical circumstances: age and stage at diagnosis, social habits, treatment modality, etc. There are few data to support specific recommendations; these represent the range of practice at [facility] [employer].   1. Follow-up after completion  of treatment up to 5 years:   A. Interim H and P: Every 3-6 months for 1-2 years, then every 6-12 months until year 3, then annually.   B. Annual influenza vaccine.   C. Laboratory studies:    1. CBC, platelets, ESR (if elevated at time of initial diagnosis), chemistry profile as clinically indicated.    2. Thyroid stimulating hormone (TSH) at least annually if radiation therapy to neck.   D. Acceptable to obtain a neck/chest/abdomen/pelvis CT scan with contrast, at 6, 12, and 24 month following completion of therapy, or as clinically indicated. PET/CT only if last PET was Deauville 4-5, to confirm complete response.   E. Counseling: Reproduction, health habits, psychosocial, cardiovascular, breast self-exam, skin cancer risk, end of treatment discussion.   F. Surveillance PET should not be done routinely due to risk of false positives. Management decision should not be based on PET scan alone; clinical or pathologic correlation is needed.   2. Follow-up and monitoring after 5 years:   A. Interim H&P: Annually    1. Annual blood pressure, aggressive management of cardiovascular risk factors.    2. Pneumococcal, meningococcal, and H-flu revaccination after 5-7 years, if patient treated with splenic radiation therapy or previous splenectomy (according to CBC recommendations).    3. Annual influenza vaccine.   B. Cardiovascular symptoms may emerge at a young age.    1. Consider stress test/echocardiogram at 10 year intervals after treatment is completed.    2. Consider carotid ultrasound at 10 year intervals if neck irradiation.   C. Laboratory studies:    1. CBC, platelets, chemistry profile annually    2. TSH at least annually if radiation therapy to neck    3. Biannual lipids    4. Annual fasting glucose   D. Annual breast screening: Initiate 8-10 years post therapy, or at age 47, whichever comes first, if chest or axillary radiation. The NCCN [name] Guidelines Panel recommends  breast MRI in addition to mammography for women who've received irradiation to the chest between ages 5-30 years, which is consistent with the [place] (ACS) Guidelines. Consider referral to a breast specialist.   E. Perform other routine surveillance tests for cervical, colorectal, endometrial, lung, and prostate cancer as per the ACS Cancer Screening Guidelines.   F. Counseling: Reproduction, health habits, psychosocial, cardiovascular, breast self-exam, and skin cancer risk.   G. Treatment summary and consideration of transfer to primary care provider.   H. Consider a referral to a survivorship clinic.  Elevated AST/ALT: Appears to be rising. Baseline is elevated.  Will re-check in 3 months. If continue to trend up will consult GI.   HIV:  -Continue surveillance by infectious disease.  Currently under great control.   -Maintain follow-up with Dr. Megan Salon, as clinically indicated.    Dispo:  RTC in April 2019 for repeat imaging MD assessment with labs.  Orders Placed This Encounter  Procedures  . CT Chest W Contrast    Standing Status:   Future    Standing Expiration Date:   02/28/2018    Order Specific Question:   If indicated for the ordered procedure, I authorize the administration of contrast media per Radiology protocol    Answer:  Yes    Order Specific Question:   Preferred imaging location?    Answer:   [facility]    Order Specific Question:   Radiology Contrast Protocol - do NOT remove file path    Answer:   [email or web address]  . CT Abdomen Pelvis W Contrast    Standing Status:   Future    Standing Expiration Date:   02/28/2018    Order Specific Question:   If indicated for the ordered procedure, I authorize the administration of contrast media per Radiology protocol    Answer:   Yes    Order Specific Question:   Preferred imaging location?    Answer:   [facility]    Order Specific Question:   Radiology  Contrast Protocol - do NOT remove file path    Answer:   [email or web address]    All questions were answered to patient's stated satisfaction. Encouraged patient to call with any new concerns or questions before her next visit to the cancer center and we can certain see her sooner, if needed.    Faythe Casa, NP 02/28/2017 1:31 PM

## 2017-02-28 NOTE — Patient Instructions (Signed)
[facility] at [facility] Discharge Instructions  RECOMMENDATIONS MADE BY THE CONSULTANT AND ANY TEST RESULTS WILL BE SENT TO YOUR REFERRING PHYSICIAN.  You were seen today by Jennifer Burns, NP  Thank you for choosing [facility] at [facility] to provide your oncology and hematology care.  To afford each patient quality time with our provider, please arrive at least 15 minutes before your scheduled appointment time.    If you have a lab appointment with the Cancer Center please come in thru the  Main Entrance and check in at the main information desk  You need to re-schedule your appointment should you arrive 10 or more minutes late.  We strive to give you quality time with our providers, and arriving late affects you and other patients whose appointments are after yours.  Also, if you no show three or more times for appointments you may be dismissed from the clinic at the providers discretion.     Again, thank you for choosing [facility].  Our hope is that these requests will decrease the amount of time that you wait before being seen by our physicians.       _____________________________________________________________  Should you have questions after your visit to [facility], please contact our office at [phone] between the hours of 8:30 a.m. and 4:30 p.m.  Voicemails left after 4:30 p.m. will not be returned until the following business day.  For prescription refill requests, have your pharmacy contact our office.       Resources For Cancer Patients and their Caregivers ? American Cancer Society: Can assist with transportation, wigs, general needs, runs Look Good Feel Better.        1-888-227-6333 ? Cancer Care: Provides financial assistance, online support groups, medication/co-pay assistance.  1-800-813-HOPE (4673) ? [facility] Assists [place] cancer patients and their  families through emotional , educational and financial support.  336-427-4357 ? [place] DSS Where to apply for food stamps, Medicaid and utility assistance. 336-342-1394 ? [facility]: Transportation to medical appointments. 336-347-2287 ? Social Security Administration: May apply for disability if have a Stage IV cancer. 336-342-7796 1-800-772-1213 ? [place] Aging, Disability and Transit Services: Assists with nutrition, care and transit needs. 336-349-2343  Cancer Center Support Programs: @[email or web address]@ > Cancer Support Group  2nd Tuesday of the month 1pm-2pm, Journey Room  > Creative Journey  3rd Tuesday of the month 1130am-1pm, Journey Room  > Look Good Feel Better  1st Wednesday of the month 10am-12 noon, Journey Room (Call American Cancer Society to register 1-800-395-5775)    

## 2017-03-07 ENCOUNTER — Ambulatory Visit (INDEPENDENT_AMBULATORY_CARE_PROVIDER_SITE_OTHER): Payer: 59 | Admitting: Podiatry

## 2017-03-07 ENCOUNTER — Encounter: Payer: Self-pay | Admitting: Podiatry

## 2017-03-07 DIAGNOSIS — B351 Tinea unguium: Secondary | ICD-10-CM | POA: Diagnosis not present

## 2017-03-07 DIAGNOSIS — M79676 Pain in unspecified toe(s): Secondary | ICD-10-CM

## 2017-03-07 DIAGNOSIS — E0843 Diabetes mellitus due to underlying condition with diabetic autonomic (poly)neuropathy: Secondary | ICD-10-CM

## 2017-03-11 NOTE — Progress Notes (Signed)
   SUBJECTIVE Patient with a history of diabetes mellitus presents to office today complaining of elongated, thickened nails. Pain while ambulating in shoes. Patient is unable to trim their own nails.   Past Medical History:  Diagnosis Date  . Acute deep vein thrombosis (DVT) of popliteal vein of left lower extremity ([place]) 02/09/2016  . Arthritis    osteoarthritis  . Diabetes mellitus without complication ([place])   . History of radiation therapy 04/25/2016 - 05/23/2016   Left Axilla treated to 36 Gy in 20 fractions  . HIV infection ([place])   . 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 Human immunodeficiency virus (HIV) disease ([place]) 02/23/2006   Qualifier: Diagnosis of  By: Megan Salon MD, [place] Patient is awake, alert, and oriented x 3 and in no acute distress. Derm Skin is dry and supple bilateral. Negative open lesions or macerations. Remaining integument unremarkable. Nails are tender, long, thickened and dystrophic with subungual debris, consistent with onychomycosis, 1-5 bilateral. No signs of infection noted. Vasc  DP and PT pedal pulses palpable bilaterally. Temperature gradient within normal limits.  Neuro Epicritic and protective threshold sensation diminished bilaterally.  Musculoskeletal Exam No symptomatic pedal deformities noted bilateral. Muscular strength within normal limits.  ASSESSMENT 1. Diabetes Mellitus w/ peripheral neuropathy 2. Onychomycosis of nail due to dermatophyte bilateral 3. Pain in foot bilateral  PLAN OF CARE 1. Patient evaluated today. 2. Instructed to maintain good pedal hygiene and foot care. Stressed importance of controlling blood sugar.  3. Mechanical debridement of nails 1-5 bilaterally performed using a nail nipper. Filed with dremel without incident.  4. Return to clinic in 3 mos.     Edrick Kins, DPM [facility]  Dr. Edrick Kins, [place]                                         [place],  [address]                Office [phone]  Fax [phone]

## 2017-04-05 DIAGNOSIS — E782 Mixed hyperlipidemia: Secondary | ICD-10-CM | POA: Diagnosis not present

## 2017-04-05 DIAGNOSIS — I10 Essential (primary) hypertension: Secondary | ICD-10-CM | POA: Diagnosis not present

## 2017-04-05 DIAGNOSIS — E119 Type 2 diabetes mellitus without complications: Secondary | ICD-10-CM | POA: Diagnosis not present

## 2017-04-09 DIAGNOSIS — C8190 Hodgkin lymphoma, unspecified, unspecified site: Secondary | ICD-10-CM | POA: Diagnosis not present

## 2017-04-11 ENCOUNTER — Inpatient Hospital Stay (HOSPITAL_COMMUNITY): Payer: 59 | Attending: Oncology

## 2017-04-11 DIAGNOSIS — C8118 Nodular sclerosis classical Hodgkin lymphoma, lymph nodes of multiple sites: Secondary | ICD-10-CM | POA: Insufficient documentation

## 2017-04-11 DIAGNOSIS — Z452 Encounter for adjustment and management of vascular access device: Secondary | ICD-10-CM | POA: Insufficient documentation

## 2017-04-11 MED ORDER — HEPARIN SOD (PORK) LOCK FLUSH 100 UNIT/ML IV SOLN
INTRAVENOUS | Status: AC
  Filled 2017-04-11: qty 5

## 2017-04-11 MED ORDER — SODIUM CHLORIDE 0.9% FLUSH
10.0000 mL | INTRAVENOUS | Status: DC | PRN
  Administered 2017-04-11: 10 mL via INTRAVENOUS
  Filled 2017-04-11: qty 10

## 2017-04-11 MED ORDER — HEPARIN SOD (PORK) LOCK FLUSH 100 UNIT/ML IV SOLN
500.0000 [IU] | Freq: Once | INTRAVENOUS | Status: AC
  Administered 2017-04-11: 500 [IU] via INTRAVENOUS

## 2017-05-23 ENCOUNTER — Encounter (HOSPITAL_COMMUNITY): Payer: Self-pay

## 2017-05-23 ENCOUNTER — Inpatient Hospital Stay (HOSPITAL_COMMUNITY): Payer: 59 | Attending: Oncology

## 2017-05-23 VITALS — BP 130/61 | HR 80 | Resp 16

## 2017-05-23 DIAGNOSIS — B20 Human immunodeficiency virus [HIV] disease: Secondary | ICD-10-CM | POA: Insufficient documentation

## 2017-05-23 DIAGNOSIS — I509 Heart failure, unspecified: Secondary | ICD-10-CM | POA: Diagnosis not present

## 2017-05-23 DIAGNOSIS — E119 Type 2 diabetes mellitus without complications: Secondary | ICD-10-CM | POA: Insufficient documentation

## 2017-05-23 DIAGNOSIS — Z95828 Presence of other vascular implants and grafts: Secondary | ICD-10-CM

## 2017-05-23 DIAGNOSIS — Z452 Encounter for adjustment and management of vascular access device: Secondary | ICD-10-CM | POA: Insufficient documentation

## 2017-05-23 DIAGNOSIS — K760 Fatty (change of) liver, not elsewhere classified: Secondary | ICD-10-CM | POA: Diagnosis not present

## 2017-05-23 DIAGNOSIS — Z923 Personal history of irradiation: Secondary | ICD-10-CM | POA: Insufficient documentation

## 2017-05-23 DIAGNOSIS — Z79899 Other long term (current) drug therapy: Secondary | ICD-10-CM | POA: Diagnosis not present

## 2017-05-23 DIAGNOSIS — I700 Atherosclerosis of aorta: Secondary | ICD-10-CM | POA: Insufficient documentation

## 2017-05-23 DIAGNOSIS — Z9221 Personal history of antineoplastic chemotherapy: Secondary | ICD-10-CM | POA: Diagnosis not present

## 2017-05-23 DIAGNOSIS — C8118 Nodular sclerosis classical Hodgkin lymphoma, lymph nodes of multiple sites: Secondary | ICD-10-CM | POA: Diagnosis not present

## 2017-05-23 DIAGNOSIS — R590 Localized enlarged lymph nodes: Secondary | ICD-10-CM | POA: Diagnosis not present

## 2017-05-23 DIAGNOSIS — I110 Hypertensive heart disease with heart failure: Secondary | ICD-10-CM | POA: Diagnosis not present

## 2017-05-23 DIAGNOSIS — C8114 Nodular sclerosis classical Hodgkin lymphoma, lymph nodes of axilla and upper limb: Secondary | ICD-10-CM

## 2017-05-23 DIAGNOSIS — R918 Other nonspecific abnormal finding of lung field: Secondary | ICD-10-CM | POA: Diagnosis not present

## 2017-05-23 LAB — CBC WITH DIFFERENTIAL/PLATELET
BASOS ABS: 0 10*3/uL (ref 0.0–0.1)
BASOS PCT: 0 %
EOS PCT: 2 %
Eosinophils Absolute: 0.1 10*3/uL (ref 0.0–0.7)
HCT: 42.6 % (ref 36.0–46.0)
Hemoglobin: 13.6 g/dL (ref 12.0–15.0)
Lymphocytes Relative: 41 %
Lymphs Abs: 2.4 10*3/uL (ref 0.7–4.0)
MCH: 30.4 pg (ref 26.0–34.0)
MCHC: 31.9 g/dL (ref 30.0–36.0)
MCV: 95.3 fL (ref 78.0–100.0)
MONO ABS: 0.5 10*3/uL (ref 0.1–1.0)
MONOS PCT: 8 %
Neutro Abs: 2.9 10*3/uL (ref 1.7–7.7)
Neutrophils Relative %: 49 %
PLATELETS: 230 10*3/uL (ref 150–400)
RBC: 4.47 MIL/uL (ref 3.87–5.11)
RDW: 13.7 % (ref 11.5–15.5)
WBC: 6 10*3/uL (ref 4.0–10.5)

## 2017-05-23 LAB — COMPREHENSIVE METABOLIC PANEL
ALBUMIN: 3.9 g/dL (ref 3.5–5.0)
ALK PHOS: 44 U/L (ref 38–126)
ALT: 121 U/L — AB (ref 14–54)
AST: 141 U/L — AB (ref 15–41)
Anion gap: 13 (ref 5–15)
BILIRUBIN TOTAL: 0.6 mg/dL (ref 0.3–1.2)
BUN: 17 mg/dL (ref 6–20)
CALCIUM: 9.8 mg/dL (ref 8.9–10.3)
CO2: 25 mmol/L (ref 22–32)
Chloride: 106 mmol/L (ref 101–111)
Creatinine, Ser: 0.83 mg/dL (ref 0.44–1.00)
GFR calc Af Amer: >60 mL/min (ref >60)
GFR calc non Af Amer: >60 mL/min (ref >60)
GLUCOSE: 172 mg/dL — AB (ref 65–99)
Potassium: 3.7 mmol/L (ref 3.5–5.1)
Sodium: 144 mmol/L (ref 135–145)
TOTAL PROTEIN: 7.2 g/dL (ref 6.5–8.1)

## 2017-05-23 LAB — LACTATE DEHYDROGENASE: LDH: 176 U/L (ref 98–192)

## 2017-05-23 MED ORDER — HEPARIN SOD (PORK) LOCK FLUSH 100 UNIT/ML IV SOLN
500.0000 [IU] | Freq: Once | INTRAVENOUS | Status: AC
  Administered 2017-05-23: 500 [IU] via INTRAVENOUS

## 2017-05-23 MED ORDER — SODIUM CHLORIDE 0.9% FLUSH
10.0000 mL | Freq: Once | INTRAVENOUS | Status: AC
  Administered 2017-05-23: 10 mL via INTRAVENOUS

## 2017-05-23 NOTE — Patient Instructions (Signed)
[facility] at [facility]  Discharge Instructions:  You port was flushed today.  _______________________________________________________________  Thank you for choosing [place] at [facility] to provide your oncology and hematology care.  To afford each patient quality time with our providers, please arrive at least 15 minutes before your scheduled appointment.  You need to re-schedule your appointment if you arrive 10 or more minutes late.  We strive to give you quality time with our providers, and arriving late affects you and other patients whose appointments are after yours.  Also, if you no show three or more times for appointments you may be dismissed from the clinic.  Again, thank you for choosing [place] at [place] hope is that these requests will allow you access to exceptional care and in a timely manner. _______________________________________________________________  If you have questions after your visit, please contact our office at [phone] between the hours of 8:30 a.m. and 5:00 p.m. Voicemails left after 4:30 p.m. will not be returned until the following business day. _______________________________________________________________  For prescription refill requests, have your pharmacy contact our office. _______________________________________________________________  Recommendations made by the consultant and any test results will be sent to your referring physician. _______________________________________________________________

## 2017-05-23 NOTE — Progress Notes (Signed)
Patients port flushed per protocol.  No complaints of pain with flush.  Port site clean and dry with no bruising or swelling noted at site.  Band aid applied.  VSs with discharge and left ambulatory with no s/s of distress noted.

## 2017-05-29 ENCOUNTER — Ambulatory Visit (HOSPITAL_COMMUNITY)
Admission: RE | Admit: 2017-05-29 | Discharge: 2017-05-29 | Disposition: A | Discharge status: Home / Self Care | Payer: 59 | Source: Ambulatory Visit | Attending: Oncology | Admitting: Oncology

## 2017-05-29 DIAGNOSIS — I2510 Atherosclerotic heart disease of native coronary artery without angina pectoris: Secondary | ICD-10-CM | POA: Insufficient documentation

## 2017-05-29 DIAGNOSIS — C8114 Nodular sclerosis classical Hodgkin lymphoma, lymph nodes of axilla and upper limb: Secondary | ICD-10-CM

## 2017-05-29 DIAGNOSIS — K760 Fatty (change of) liver, not elsewhere classified: Secondary | ICD-10-CM | POA: Diagnosis not present

## 2017-05-29 DIAGNOSIS — I700 Atherosclerosis of aorta: Secondary | ICD-10-CM | POA: Diagnosis not present

## 2017-05-29 DIAGNOSIS — R590 Localized enlarged lymph nodes: Secondary | ICD-10-CM | POA: Insufficient documentation

## 2017-05-29 DIAGNOSIS — R918 Other nonspecific abnormal finding of lung field: Secondary | ICD-10-CM | POA: Insufficient documentation

## 2017-05-29 DIAGNOSIS — C8112 Nodular sclerosis classical Hodgkin lymphoma, intrathoracic lymph nodes: Secondary | ICD-10-CM | POA: Diagnosis not present

## 2017-05-29 MED ORDER — IOPAMIDOL (ISOVUE-300) INJECTION 61%
100.0000 mL | Freq: Once | INTRAVENOUS | Status: AC | PRN
  Administered 2017-05-29: 100 mL via INTRAVENOUS

## 2017-05-31 ENCOUNTER — Encounter (HOSPITAL_COMMUNITY): Payer: Self-pay | Admitting: Internal Medicine

## 2017-05-31 ENCOUNTER — Other Ambulatory Visit: Payer: Self-pay

## 2017-05-31 ENCOUNTER — Inpatient Hospital Stay (HOSPITAL_BASED_OUTPATIENT_CLINIC_OR_DEPARTMENT_OTHER): Payer: 59 | Admitting: Internal Medicine

## 2017-05-31 DIAGNOSIS — R590 Localized enlarged lymph nodes: Secondary | ICD-10-CM

## 2017-05-31 DIAGNOSIS — B20 Human immunodeficiency virus [HIV] disease: Secondary | ICD-10-CM | POA: Diagnosis not present

## 2017-05-31 DIAGNOSIS — Z452 Encounter for adjustment and management of vascular access device: Secondary | ICD-10-CM | POA: Diagnosis not present

## 2017-05-31 DIAGNOSIS — Z79899 Other long term (current) drug therapy: Secondary | ICD-10-CM | POA: Diagnosis not present

## 2017-05-31 DIAGNOSIS — C8118 Nodular sclerosis classical Hodgkin lymphoma, lymph nodes of multiple sites: Secondary | ICD-10-CM

## 2017-05-31 DIAGNOSIS — R918 Other nonspecific abnormal finding of lung field: Secondary | ICD-10-CM | POA: Diagnosis not present

## 2017-05-31 DIAGNOSIS — I110 Hypertensive heart disease with heart failure: Secondary | ICD-10-CM

## 2017-05-31 DIAGNOSIS — I700 Atherosclerosis of aorta: Secondary | ICD-10-CM | POA: Diagnosis not present

## 2017-05-31 DIAGNOSIS — E119 Type 2 diabetes mellitus without complications: Secondary | ICD-10-CM

## 2017-05-31 DIAGNOSIS — Z9221 Personal history of antineoplastic chemotherapy: Secondary | ICD-10-CM

## 2017-05-31 DIAGNOSIS — I509 Heart failure, unspecified: Secondary | ICD-10-CM

## 2017-05-31 DIAGNOSIS — Z923 Personal history of irradiation: Secondary | ICD-10-CM

## 2017-05-31 DIAGNOSIS — K760 Fatty (change of) liver, not elsewhere classified: Secondary | ICD-10-CM

## 2017-05-31 NOTE — Progress Notes (Signed)
Diagnosis Nodular sclerosis [name] of lymph nodes of multiple regions ([place]) - Plan: CBC with Differential/Platelet, Comprehensive metabolic panel, Lactate dehydrogenase, Sedimentation rate, CT Chest W Contrast, CT Abdomen Pelvis W Contrast, MM Digital Diagnostic Bilat  Staging Cancer Staging [name], nodular sclerosis ([place]) Staging form: Lymphoid Neoplasms, AJCC 6th Edition - Clinical stage from 08/31/2015: Stage III - Signed by Baird Cancer, PA-C on 09/01/2015   Assessment and Plan:  1.  Hodgkin's Lymphoma, nodular-sclerosing type involving multiple lymph nodes:  -CD30, CD15, & PAX-5 positive; s/p ABVD chemotherapy beginning on 09/13/15, with discontinuation of Bleomycin on 10/11/15.  Mid-course PET scan revealed Deauville 3 lymphadenopathy, so she received 4 additional cycles of AVD.  Chemo course was complicated by diastolic heart failure (followed by Dr. Haroldine Laws), in addition to HIV (followed by Dr. Megan Salon with ID). Chemotherapy completed on 02/23/16. Post-treatment PET revealed mild residual (L) axillary adenopathy (Deauville 2), so she was referred for axillary radiation therapy.  -Completed XRT to left axilla on 05/23/16.   Last Pet scan done 07/24/2016 and showed:   IMPRESSION: 1. Stable exam. The mild residual low level metabolic activity within left axillary lymph nodes a similar to prior (Deauville 2). 2. No new or progressive findings. 3. Abdominal aortic atherosclerosis.  She had CT CAP done 05/29/2017 that showed  IMPRESSION: 1. Several borderline enlarged and mildly enlarged left axillary lymph nodes appear slightly decreased in size compared to prior study from October 2018. This could reflect residual disease. No new or enlarging lymph nodes are noted elsewhere in the chest, abdomen or pelvis. 2. Hepatic steatosis. 3. Aortic atherosclerosis, in addition to left anterior descending coronary artery disease. Please note that although the presence  of coronary artery calcium documents the presence of coronary artery disease, the severity of this disease and any potential stenosis cannot be assessed on this non-gated CT examination. Assessment for potential risk factor modification, dietary therapy or pharmacologic therapy may be warranted, if clinically indicated. 4. Multiple tiny 2-4 mm pulmonary nodules, stable compared to the prior examination, favored to be benign. Attention at time of routine follow-up imaging is recommended to ensure continued stability. 5. Additional incidental findings, as above.  Patient reports she has not undergone mammogram due to still having PAC.  I discussed with her today she will be set up for bilateral diagnostic mammogram for further evaluation of axillary adenopathy and based on her history of Hodgkin's lymphoma.  I have discussed with her port should be removed this year as she has been demonstrating NED other than questionable axillary adenopathy..  Options would be to have PAC removed after her next CT imaging which is planned for October 2019.  Pending mammogram results she will return to clinic in October 2019 for follow-up and repeat labs.  2.  [name].  Continue infectious disease or PCP follow-up.  3.  Hypertension.  Blood pressure is 121/58.  4.  Elevated liver function tests.  Recent imaging showed evidence of a fatty liver.  She is also on cholesterol medicines.   I have discussed with her to follow-up with her PCP for monitoring due to cholesterol medications.   Current Status: Patient is seen today for follow-up.  She is here to go over her CT scans.  When questioned she has not undergone mammogram evaluation reportedly because she still has port in place.    [name], nodular sclerosis ([place])   07/23/2015 Procedure    Lext axilla lymph node biopsy      07/27/2015 Pathology Results  Classical [name], nodular sclerosis type.  Positive CD30, CD15, andPAX-5.   Negative CD45 and CD20.      08/31/2015 PET scan    Hypermetabolic lymphadenopathy. Nodal stations include: L supraclavicular nodes, L axillary nodes, mediastinal lymph nodes, periaortic retroperitoneal and periportal lymph nodes as well as L iliac and inguinal lymph nodes. Two concerning pulm nodules...      09/01/2015 Imaging    MUGA- Normal LEFT ventricular ejection fraction of 73%.  Normal LEFT ventricular wall motion.      09/01/2015 Procedure    PFTs-  Spirometry shows mild ventilatory defect without airflow obstruction and no bronchodilator improvement. Lung volumes are normal. Airway resistance is normal. DLCO normal. Essentially normal lung function      09/08/2015 Procedure    Port placed by Dr. Rosana Hoes      09/13/2015 - 02/23/2016 Chemotherapy    ABVD      09/27/2015 Imaging    CT angio of chest- no gross PE seen. Improving axillary, mediastinal and upper abdominal adenopathy. Improved and possibly resolved RUL nodule and with resolved or obscured LLL nodule. Small L pleural effusion and mild L lower lobe atelectasis.      10/11/2015 Treatment Plan Change    Bleomycin discontinued       11/01/2015 PET scan    Clear interval response to therapy with decrease in size and hypermetabolism of larger lymph nodes seen previously and some of the smaller hypermetabolic lymph node seen on the previous study have resolved completely in terms of abnormal enlargement and hypermetabolism. 2. Slight decrease in left lower lobe pulmonary nodule with interval resolution of the right upper lobe pulmonary nodule. 3. Stable appearance of mild heterogeneous FDG accumulation within the marrow space.      11/01/2015 Miscellaneous    Deauville 3- 4 more cycles of AVD recommended according to NCCN guidelines.      02/09/2016 Imaging    [place] left leg- Positive for acute DVT in left calf, popliteal and femoral veins.      03/22/2016 PET scan    1. Mild residual metabolic activity within LEFT axillary  lymph nodes ( Deauville 2 ). 2. No additional hypermetabolic adenopathy identified. 3. Normal spleen and marrow activity.      04/25/2016 - 05/23/2016 Radiation Therapy    Residual disease treated with radiation Isidore Moos): Left axilla treated with 36 Gy in 20 fractions.      07/24/2016 PET scan    PET IMPRESSION: 1. Stable exam. The mild residual low level metabolic activity within left axillary lymph nodes a similar to prior (Deauville 2). 2. No new or progressive findings. 3. Abdominal aortic atherosclerosis.       11/23/2016 Imaging    CT C/A/P: 1. Essentially stable left axillary adenopathy. No new adenopathy identified. 2.  Aortic Atherosclerosis (ICD10-I70.0). 3. Small left adrenal adenoma. 4. Midline ventral hernia contains about 630 cc of omental adipose tissue.        Problem List Patient Active Problem List   Diagnosis Date Noted  . 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[place]) [C81.14] 04/05/2016  . Acute deep vein thrombosis (DVT) of popliteal vein of left lower extremity ([place]) [I82.432] 02/09/2016  . Unintentional weight loss [R63.4] 12/16/2015  . Congestive heart failure with cardiomyopathy ([place]) [I50.9, I42.9] 10/27/2015  . Acute congestive heart failure ([place]) [I50.9] 10/27/2015  . 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 Visual floaters [H43.399] 09/12/2011  . Episodic low back pain [M54.5] 11/15/2010  . ROTATOR CUFF  INJURY, RIGHT SHOULDER [M71.9, M67.919] 11/03/2008  . ADHESIVE CAPSULITIS OF SHOULDER [M75.00] 11/12/2007  . OBESITY NOS [E66.9] 05/17/2006  . SYNDROME, CARPAL TUNNEL [G56.00] 05/17/2006  . FOOT PAIN [M79.609] 05/17/2006  . Human immunodeficiency virus (HIV) disease ([place]) [B20] 02/23/2006  . Hyperlipidemia [E78.5] 02/23/2006  . DEPRESSION [F32.9] 02/23/2006  . Essential hypertension [I10] 02/23/2006    Past Medical History Past Medical History:  Diagnosis Date  . Acute deep vein thrombosis  (DVT) of popliteal vein of left lower extremity ([place]) 02/09/2016  . Arthritis    osteoarthritis  . Diabetes mellitus without complication ([place])   . History of radiation therapy 04/25/2016 - 05/23/2016   Left Axilla treated to 36 Gy in 20 fractions  . HIV infection ([place])   . 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 Human immunodeficiency virus (HIV) disease ([place]) 02/23/2006   Qualifier: Diagnosis of  By: Megan Salon MD, [place]      Past Surgical History Past Surgical History:  Procedure Laterality Date  . AXILLARY LYMPH NODE BIOPSY Left 07/23/2015   Procedure: EXCISIONAL BIOPSY LEFT AXILLARY LYMPH NODE;  Surgeon: [name], MD;  Location: [facility];  Service: General;  Laterality: Left;  . PORTACATH PLACEMENT Right 09/08/2015   Procedure: INSERTION OF CENTRAL VENOUS CATHETER WITH PORT FOR CHEMOTHERAPY;  Surgeon: [name], MD;  Location: [facility];  Service: General;  Laterality: Right;  . SHOULDER SURGERY Right    removal of bone spur    Family History Family History  Problem Relation Age of Onset  . Hypertension Mother   . Cancer Mother 23       breast   . Hypertension Father   . Heart attack Father   . Pulmonary embolism Sister   . Parkinson's disease Brother      Social History  reports that she has never smoked. She has never used smokeless tobacco. She reports that she does not drink alcohol or use drugs.  Medications  Current Outpatient Medications:  .  cholecalciferol (VITAMIN D) 1000 units tablet, Take 2,000 Units by mouth daily., Disp: , Rfl:  .  emollient (RADIAGEL) gel, Apply topically as needed for wound care., Disp: , Rfl:  .  fenofibrate 160 MG tablet, Take 160 mg by mouth daily. , Disp: , Rfl:  .  fexofenadine (ALLEGRA) 180 MG tablet, Take 180 mg by mouth daily as needed. , Disp: , Rfl:  .  furosemide (LASIX) 20 MG tablet, Take 1 tablet (20 mg total) by mouth daily., Disp: 90 tablet, Rfl: 2 .  Garlic 3614 MG CAPS, Take 1,000 mg by mouth daily as  needed. , Disp: , Rfl:  .  Icosapent Ethyl (VASCEPA) 1 g CAPS, Take by mouth., Disp: , Rfl:  .  JANUMET XR [phone] MG TB24, Take 1 tablet by mouth 2 (two) times daily., Disp: , Rfl: 0 .  lidocaine-prilocaine (EMLA) cream, Apply 1 application topically as needed (prior to accessing port)., Disp: , Rfl:  .  metoprolol tartrate (LOPRESSOR) 25 MG tablet, Take 1 tablet (25 mg total) by mouth daily., Disp: 30 tablet, Rfl: 0 .  Multiple Vitamin (MULTIVITAMIN WITH MINERALS) TABS tablet, Take 1 tablet by mouth daily., Disp: , Rfl:  .  ODEFSEY 200-25-25 MG TABS tablet, TAKE ONE TABLET BY MOUTH ONCE DAILY WITH A MEAL. STORE IN ORIGINAL CONTAINER AT ROOM TEMPERATURE., Disp: 90 tablet, Rfl: 1 .  spironolactone (ALDACTONE) 25 MG tablet, TAKE 1 TABLET DAILY, Disp: 90 tablet, Rfl: 3 .  TIVICAY 50 MG tablet, TAKE  ONE TABLET (50 MG) BY MOUTH ONCE DAILY WITH A MEAL. STORE AT [facility] Disp: 90 tablet, Rfl: 1 .  UNABLE TO FIND, Med Name: Theracurmin 60 mg po daily, Disp: , Rfl:   Allergies Augmentin [amoxicillin-pot clavulanate] and Bleomycin  Review of Systems Review of Systems - Oncology ROS as per HPI otherwise 12 point ROS is negative.   Physical Exam  Vitals Wt Readings from Last 3 Encounters:  02/28/17 280 lb 9.6 oz (127.3 kg)  11/27/16 272 lb (123.4 kg)  10/27/16 272 lb 1.6 oz (123.4 kg)   Temp Readings from Last 3 Encounters:  04/11/17 98.1 F (36.7 C)  02/28/17 (!) 97.3 F (36.3 C) (Oral)  12/22/16 97.7 F (36.5 C) (Oral)   BP Readings from Last 3 Encounters:  05/23/17 130/61  04/11/17 (!) 134/59  02/28/17 (!) 126/57   Pulse Readings from Last 3 Encounters:  05/23/17 80  04/11/17 84  02/28/17 70   Constitutional: Well-developed, well-nourished, and in no distress.   HENT: Head: Normocephalic and atraumatic.  Mouth/Throat: No oropharyngeal exudate. Mucosa moist. Eyes: Pupils are equal, round, and reactive to light. Conjunctivae are normal. No scleral icterus.  Neck:  Normal range of motion. Neck supple. No JVD present.  Cardiovascular: Normal rate, regular rhythm and normal heart sounds.  Exam reveals no gallop and no friction rub.   No murmur heard. Pulmonary/Chest: Effort normal and breath sounds normal. No respiratory distress. No wheezes.No rales.  Abdominal: Soft. Bowel sounds are normal. No distension. There is no tenderness. There is no guarding.  Musculoskeletal: No edema or tenderness.  Lymphadenopathy: No cervical, axillary or supraclavicular adenopathy.  Neurological: Alert and oriented to person, place, and time. No cranial nerve deficit.  Skin: Skin is warm and dry. No rash noted. No erythema. No pallor.  Psychiatric: Affect and judgment normal.   Labs No visits with results within 3 Day(s) from this visit.  Latest known visit with results is:  Infusion on 05/23/2017  Component Date Value Ref Range Status  . LDH 05/23/2017 176  98 - 192 U/L Final   Performed at [facility], [address] [place], [place] [address]  . Sodium 05/23/2017 144  135 - 145 mmol/L Final  . Potassium 05/23/2017 3.7  3.5 - 5.1 mmol/L Final  . Chloride 05/23/2017 106  101 - 111 mmol/L Final  . CO2 05/23/2017 25  22 - 32 mmol/L Final  . Glucose, Bld 05/23/2017 172* 65 - 99 mg/dL Final  . BUN 05/23/2017 17  6 - 20 mg/dL Final  . Creatinine, Ser 05/23/2017 0.83  0.44 - 1.00 mg/dL Final  . Calcium 05/23/2017 9.8  8.9 - 10.3 mg/dL Final  . Total Protein 05/23/2017 7.2  6.5 - 8.1 g/dL Final  . Albumin 05/23/2017 3.9  3.5 - 5.0 g/dL Final  . AST 05/23/2017 141* 15 - 41 U/L Final  . ALT 05/23/2017 121* 14 - 54 U/L Final  . Alkaline Phosphatase 05/23/2017 44  38 - 126 U/L Final  . Total Bilirubin 05/23/2017 0.6  0.3 - 1.2 mg/dL Final  . GFR calc non Af Amer 05/23/2017 >60  >60 mL/min Final  . GFR calc Af Amer 05/23/2017 >60  >60 mL/min Final   Comment: (NOTE) The eGFR has been calculated using the CKD EPI equation. This calculation has not been validated in all  clinical situations. eGFR's persistently <60 mL/min signify possible Chronic Kidney Disease.   . Anion gap 05/23/2017 13  5 - 15 Final   Performed at [facility]  [facility], [address] [place], [place] [address]  . WBC 05/23/2017 6.0  4.0 - 10.5 K/uL Final  . RBC 05/23/2017 4.47  3.87 - 5.11 MIL/uL Final  . Hemoglobin 05/23/2017 13.6  12.0 - 15.0 g/dL Final  . HCT 05/23/2017 42.6  36.0 - 46.0 % Final  . MCV 05/23/2017 95.3  78.0 - 100.0 fL Final  . MCH 05/23/2017 30.4  26.0 - 34.0 pg Final  . MCHC 05/23/2017 31.9  30.0 - 36.0 g/dL Final  . RDW 05/23/2017 13.7  11.5 - 15.5 % Final  . Platelets 05/23/2017 230  150 - 400 K/uL Final  . Neutrophils Relative % 05/23/2017 49  % Final  . Neutro Abs 05/23/2017 2.9  1.7 - 7.7 K/uL Final  . Lymphocytes Relative 05/23/2017 41  % Final  . Lymphs Abs 05/23/2017 2.4  0.7 - 4.0 K/uL Final  . Monocytes Relative 05/23/2017 8  % Final  . Monocytes Absolute 05/23/2017 0.5  0.1 - 1.0 K/uL Final  . Eosinophils Relative 05/23/2017 2  % Final  . Eosinophils Absolute 05/23/2017 0.1  0.0 - 0.7 K/uL Final  . Basophils Relative 05/23/2017 0  % Final  . Basophils Absolute 05/23/2017 0.0  0.0 - 0.1 K/uL Final   Performed at [facility], [address], [place], [place] [address]     Pathology Orders Placed This Encounter  Procedures  . CT Chest W Contrast    Standing Status:   Future    Standing Expiration Date:   05/31/2018    Order Specific Question:   If indicated for the ordered procedure, I authorize the administration of contrast media per Radiology protocol    Answer:   Yes    Order Specific Question:   Preferred imaging location?    Answer:   [facility]    Order Specific Question:   Radiology Contrast Protocol - do NOT remove file path    Answer:   \\[email or web address]    Order Specific Question:   Reason for Exam additional comments    Answer:   hodgkin  . CT Abdomen Pelvis W Contrast    Standing Status:   Future     Standing Expiration Date:   05/31/2018    Order Specific Question:   If indicated for the ordered procedure, I authorize the administration of contrast media per Radiology protocol    Answer:   Yes    Order Specific Question:   Preferred imaging location?    Answer:   [facility]    Order Specific Question:   Radiology Contrast Protocol - do NOT remove file path    Answer:   \\[email or web address]  . MM Digital Diagnostic Bilat    Standing Status:   Future    Standing Expiration Date:   05/31/2018    Order Specific Question:   Reason for Exam (SYMPTOM  OR DIAGNOSIS REQUIRED)    Answer:   hodgkins lymphoma,  axillary LN noted on CT    Order Specific Question:   Preferred imaging location?    Answer:   [facility]  . CBC with Differential/Platelet    Standing Status:   Future    Standing Expiration Date:   06/01/2018  . Comprehensive metabolic panel    Standing Status:   Future    Standing Expiration Date:   06/01/2018  . Lactate dehydrogenase    Standing Status:   Future    Standing Expiration Date:   06/01/2018  . Sedimentation rate    Standing  Status:   Future    Standing Expiration Date:   06/01/2018       Zoila Shutter MD

## 2017-06-06 ENCOUNTER — Encounter: Payer: Self-pay | Admitting: Podiatry

## 2017-06-06 ENCOUNTER — Ambulatory Visit (INDEPENDENT_AMBULATORY_CARE_PROVIDER_SITE_OTHER): Payer: 59 | Admitting: Podiatry

## 2017-06-06 DIAGNOSIS — B351 Tinea unguium: Secondary | ICD-10-CM | POA: Diagnosis not present

## 2017-06-06 DIAGNOSIS — E0843 Diabetes mellitus due to underlying condition with diabetic autonomic (poly)neuropathy: Secondary | ICD-10-CM

## 2017-06-06 DIAGNOSIS — M79676 Pain in unspecified toe(s): Secondary | ICD-10-CM

## 2017-06-11 ENCOUNTER — Other Ambulatory Visit (HOSPITAL_COMMUNITY): Payer: Self-pay | Admitting: Internal Medicine

## 2017-06-11 DIAGNOSIS — R928 Other abnormal and inconclusive findings on diagnostic imaging of breast: Secondary | ICD-10-CM

## 2017-06-12 ENCOUNTER — Ambulatory Visit (HOSPITAL_COMMUNITY)
Admission: RE | Admit: 2017-06-12 | Discharge: 2017-06-12 | Disposition: A | Discharge status: Home / Self Care | Payer: 59 | Source: Ambulatory Visit | Attending: Internal Medicine | Admitting: Internal Medicine

## 2017-06-12 DIAGNOSIS — C8118 Nodular sclerosis classical Hodgkin lymphoma, lymph nodes of multiple sites: Secondary | ICD-10-CM | POA: Diagnosis not present

## 2017-06-12 DIAGNOSIS — R922 Inconclusive mammogram: Secondary | ICD-10-CM | POA: Diagnosis not present

## 2017-06-12 NOTE — Progress Notes (Signed)
   SUBJECTIVE Patient with a history of diabetes mellitus presents to office today complaining of elongated, thickened nails that cause pain while ambulating in shoes. She is unable to trim her own nails. Patient is here for further evaluation and treatment.   Past Medical History:  Diagnosis Date  . Acute deep vein thrombosis (DVT) of popliteal vein of left lower extremity (HCC) 02/09/2016  . Arthritis    osteoarthritis  . Diabetes mellitus without complication (HCC)   . History of radiation therapy 04/25/2016 - 05/23/2016   Left Axilla treated to 36 Gy in 20 fractions  . HIV infection (HCC)   . 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 Human immunodeficiency virus (HIV) disease (HCC) 02/23/2006   Qualifier: Diagnosis of  By: Campbell MD, John      OBJECTIVE General Patient is awake, alert, and oriented x 3 and in no acute distress. Derm Skin is dry and supple bilateral. Negative open lesions or macerations. Remaining integument unremarkable. Nails are tender, long, thickened and dystrophic with subungual debris, consistent with onychomycosis, 1-5 bilateral. No signs of infection noted. Vasc  DP and PT pedal pulses palpable bilaterally. Temperature gradient within normal limits.  Neuro Epicritic and protective threshold sensation diminished bilaterally.  Musculoskeletal Exam No symptomatic pedal deformities noted bilateral. Muscular strength within normal limits.  ASSESSMENT 1. Diabetes Mellitus w/ peripheral neuropathy 2. Onychomycosis of nail due to dermatophyte bilateral 3. Pain in foot bilateral  PLAN OF CARE 1. Patient evaluated today. 2. Instructed to maintain good pedal hygiene and foot care. Stressed importance of controlling blood sugar.  3. Mechanical debridement of nails 1-5 bilaterally performed using a nail nipper. Filed with dremel without incident.  4. Return to clinic in 3 mos.     Holly Stephens, DPM [facility]  Dr. Kaia Depaolis M. Deseree Zemaitis,  DPM    [address]                                        [place], [place] [address]                Office [phone]  Fax [phone]      

## 2017-07-21 IMAGING — NM NM CARDIA MUGA REST
3 series · 18 of 18 positions shown · non-contrast
Comparison: None

CLINICAL DATA: Nodular sclerosing Hodgkin's lymphoma of axillary
lymph nodes, planned Adriamycin chemotherapy

EXAM:
NUCLEAR MEDICINE CARDIAC BLOOD POOL IMAGING (MUGA)
TECHNIQUE: Cardiac multi-gated acquisition was performed at rest following
intravenous injection of Jc-DDm labeled red blood cells.
RADIOPHARMACEUTICALS:  25 mCi Jc-DDm in-vitro labeled red blood
cells IV

[Series 1: lao · 4.73mm/px · 6 of 16 frames shown]
[frame 2/16]
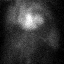
[frame 4/16]
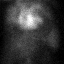
[frame 7/16]
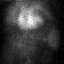
[frame 10/16]
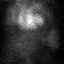
[frame 12/16]
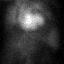
[frame 15/16]
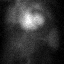

[Series 2: lt lat/anterior · 4.73mm/px · 6 of 16 frames shown (1 of 2)]
[frame 2/16]
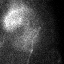
[frame 4/16]
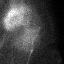
[frame 7/16]
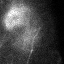
[frame 10/16]
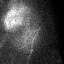
[frame 12/16]
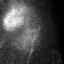
[frame 15/16]
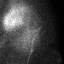

[Series 2: lt lat/anterior · 4.73mm/px · 6 of 16 frames shown (2 of 2)]
[frame 2/16]
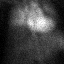
[frame 4/16]
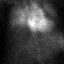
[frame 7/16]
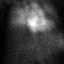
[frame 10/16]
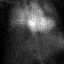
[frame 12/16]
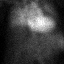
[frame 15/16]
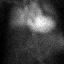

[18 of 18 positions shown; findings below may reference images not displayed]

FINDINGS: Calculated LEFT ventricular ejection fraction is 73%, within the
normal range.

Normal left ventricular wall motion on cine analysis of the gated
blood pool in 3 projections.
IMPRESSION: Normal LEFT ventricular ejection fraction of 73%.

Normal LEFT ventricular wall motion.

## 2017-07-25 ENCOUNTER — Inpatient Hospital Stay (HOSPITAL_COMMUNITY): Payer: 59 | Attending: Oncology

## 2017-07-25 ENCOUNTER — Encounter (HOSPITAL_COMMUNITY): Payer: Self-pay

## 2017-07-25 VITALS — BP 133/68 | HR 77 | Resp 18

## 2017-07-25 DIAGNOSIS — Z452 Encounter for adjustment and management of vascular access device: Secondary | ICD-10-CM | POA: Diagnosis present

## 2017-07-25 DIAGNOSIS — Z95828 Presence of other vascular implants and grafts: Secondary | ICD-10-CM

## 2017-07-25 DIAGNOSIS — C8118 Nodular sclerosis classical Hodgkin lymphoma, lymph nodes of multiple sites: Secondary | ICD-10-CM

## 2017-07-25 MED ORDER — HEPARIN SOD (PORK) LOCK FLUSH 100 UNIT/ML IV SOLN
500.0000 [IU] | Freq: Once | INTRAVENOUS | Status: AC
  Administered 2017-07-25: 500 [IU] via INTRAVENOUS

## 2017-07-25 MED ORDER — SODIUM CHLORIDE 0.9% FLUSH
10.0000 mL | Freq: Once | INTRAVENOUS | Status: AC
  Administered 2017-07-25: 10 mL via INTRAVENOUS

## 2017-07-25 NOTE — Patient Instructions (Signed)
[facility]  Discharge Instructions:  Your port was flushed today. _______________________________________________________________  Thank you for choosing [facility] at [facility] to provide your oncology and hematology care.  To afford each patient quality time with our providers, please arrive at least 15 minutes before your scheduled appointment.  You need to re-schedule your appointment if you arrive 10 or more minutes late.  We strive to give you quality time with our providers, and arriving late affects you and other patients whose appointments are after yours.  Also, if you no show three or more times for appointments you may be dismissed from the clinic.  Again, thank you for choosing [facility] at [facility]. Our hope is that these requests will allow you access to exceptional care and in a timely manner. _______________________________________________________________  If you have questions after your visit, please contact our office at [phone] between the hours of 8:30 a.m. and 5:00 p.m. Voicemails left after 4:30 p.m. will not be returned until the following business day. _______________________________________________________________  For prescription refill requests, have your pharmacy contact our office. _______________________________________________________________  Recommendations made by the consultant and any test results will be sent to your referring physician. _______________________________________________________________ 

## 2017-07-25 NOTE — Progress Notes (Signed)
Patient tolerated port flush with no complaints.  Port site clean and dry with no bruising or swelling noted at site.  No blood return but stated she could "smell" the flush.  Band aid applied.  VSs with discharge and left ambulatory with no s/s of distress noted.

## 2017-08-06 DIAGNOSIS — E119 Type 2 diabetes mellitus without complications: Secondary | ICD-10-CM | POA: Diagnosis not present

## 2017-08-06 DIAGNOSIS — E782 Mixed hyperlipidemia: Secondary | ICD-10-CM | POA: Diagnosis not present

## 2017-08-10 DIAGNOSIS — E1169 Type 2 diabetes mellitus with other specified complication: Secondary | ICD-10-CM | POA: Diagnosis not present

## 2017-08-10 DIAGNOSIS — I10 Essential (primary) hypertension: Secondary | ICD-10-CM | POA: Diagnosis not present

## 2017-08-10 DIAGNOSIS — E782 Mixed hyperlipidemia: Secondary | ICD-10-CM | POA: Diagnosis not present

## 2017-08-24 DIAGNOSIS — E119 Type 2 diabetes mellitus without complications: Secondary | ICD-10-CM | POA: Diagnosis not present

## 2017-08-24 DIAGNOSIS — E782 Mixed hyperlipidemia: Secondary | ICD-10-CM | POA: Diagnosis not present

## 2017-08-24 DIAGNOSIS — I10 Essential (primary) hypertension: Secondary | ICD-10-CM | POA: Diagnosis not present

## 2017-09-05 ENCOUNTER — Ambulatory Visit: Payer: 59 | Admitting: Podiatry

## 2017-09-06 ENCOUNTER — Other Ambulatory Visit: Payer: 59

## 2017-09-06 DIAGNOSIS — B20 Human immunodeficiency virus [HIV] disease: Secondary | ICD-10-CM

## 2017-09-07 LAB — COMPREHENSIVE METABOLIC PANEL
AG RATIO: 1.6 (calc) (ref 1.0–2.5)
ALT: 65 U/L — ABNORMAL HIGH (ref 6–29)
AST: 55 U/L — AB (ref 10–35)
Albumin: 4.7 g/dL (ref 3.6–5.1)
Alkaline phosphatase (APISO): 39 U/L (ref 33–130)
BILIRUBIN TOTAL: 0.6 mg/dL (ref 0.2–1.2)
BUN: 14 mg/dL (ref 7–25)
CALCIUM: 10.6 mg/dL — AB (ref 8.6–10.4)
CHLORIDE: 101 mmol/L (ref 98–110)
CO2: 28 mmol/L (ref 20–32)
Creat: 0.7 mg/dL (ref 0.50–0.99)
GLOBULIN: 3 g/dL (ref 1.9–3.7)
GLUCOSE: 154 mg/dL — AB (ref 65–99)
Potassium: 4.4 mmol/L (ref 3.5–5.3)
Sodium: 139 mmol/L (ref 135–146)
Total Protein: 7.7 g/dL (ref 6.1–8.1)

## 2017-09-07 LAB — CBC
HCT: 45.1 % — ABNORMAL HIGH (ref 35.0–45.0)
Hemoglobin: 15.7 g/dL — ABNORMAL HIGH (ref 11.7–15.5)
MCH: 31.3 pg (ref 27.0–33.0)
MCHC: 34.8 g/dL (ref 32.0–36.0)
MCV: 90 fL (ref 80.0–100.0)
MPV: 9.9 fL (ref 7.5–12.5)
PLATELETS: 271 10*3/uL (ref 140–400)
RBC: 5.01 10*6/uL (ref 3.80–5.10)
RDW: 12.8 % (ref 11.0–15.0)
WBC: 7.6 10*3/uL (ref 3.8–10.8)

## 2017-09-07 LAB — T-HELPER CELL (CD4) - (RCID CLINIC ONLY)
CD4 T CELL ABS: 790 /uL (ref 400–2700)
CD4 T CELL HELPER: 28 % — AB (ref 33–55)

## 2017-09-07 LAB — LIPID PANEL
CHOLESTEROL: 272 mg/dL — AB (ref <200)
HDL: 35 mg/dL — AB (ref >50)
LDL CHOLESTEROL (CALC): 179 mg/dL — AB
Non-HDL Cholesterol (Calc): 237 mg/dL (calc) — ABNORMAL HIGH (ref <130)
Total CHOL/HDL Ratio: 7.8 (calc) — ABNORMAL HIGH (ref <5.0)
Triglycerides: 342 mg/dL — ABNORMAL HIGH (ref <150)

## 2017-09-07 LAB — RPR: RPR: NONREACTIVE

## 2017-09-08 LAB — HIV-1 RNA QUANT-NO REFLEX-BLD
HIV 1 RNA QUANT: NOT DETECTED {copies}/mL
HIV-1 RNA QUANT, LOG: NOT DETECTED {Log_copies}/mL

## 2017-09-12 ENCOUNTER — Ambulatory Visit: Payer: 59 | Admitting: Podiatry

## 2017-09-19 ENCOUNTER — Ambulatory Visit (INDEPENDENT_AMBULATORY_CARE_PROVIDER_SITE_OTHER): Payer: 59 | Admitting: Internal Medicine

## 2017-09-19 ENCOUNTER — Encounter: Payer: Self-pay | Admitting: Internal Medicine

## 2017-09-19 DIAGNOSIS — B20 Human immunodeficiency virus [HIV] disease: Secondary | ICD-10-CM | POA: Diagnosis not present

## 2017-09-19 NOTE — Progress Notes (Signed)
Patient Active Problem List   Diagnosis Date Noted  . Human immunodeficiency virus (HIV) disease ([place]) 02/23/2006    Priority: High  . 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 Acute deep vein thrombosis (DVT) of popliteal vein of left lower extremity ([place]) 02/09/2016  . Unintentional weight loss 12/16/2015  . Congestive heart failure with cardiomyopathy ([place]) 10/27/2015  . Acute congestive heart failure ([place]) 10/27/2015  . [name], nodular sclerosis ([place]) 08/23/2015  . Visual floaters 09/12/2011  . Episodic low back pain 11/15/2010  . ROTATOR CUFF INJURY, RIGHT SHOULDER 11/03/2008  . ADHESIVE CAPSULITIS OF SHOULDER 11/12/2007  . OBESITY NOS 05/17/2006  . SYNDROME, CARPAL TUNNEL 05/17/2006  . FOOT PAIN 05/17/2006  . Hyperlipidemia 02/23/2006  . DEPRESSION 02/23/2006  . Essential hypertension 02/23/2006    Patient's Medications  New Prescriptions   No medications on file  Previous Medications   CHOLECALCIFEROL (VITAMIN D) 1000 UNITS TABLET    Take 2,000 Units by mouth daily.   EMOLLIENT (RADIAGEL) GEL    Apply topically as needed for wound care.   FENOFIBRATE 160 MG TABLET    Take 160 mg by mouth daily.    FEXOFENADINE (ALLEGRA) 180 MG TABLET    Take 180 mg by mouth daily as needed.    FUROSEMIDE (LASIX) 20 MG TABLET    Take 1 tablet (20 mg total) by mouth daily.   ICOSAPENT ETHYL (VASCEPA) 1 G CAPS    Take by mouth.   JANUMET XR [phone] MG TB24    Take 1 tablet by mouth 2 (two) times daily.   JARDIANCE 25 MG TABS TABLET       LIDOCAINE-PRILOCAINE (EMLA) CREAM    Apply 1 application topically as needed (prior to accessing port).   METOPROLOL TARTRATE (LOPRESSOR) 25 MG TABLET    Take 1 tablet (25 mg total) by mouth daily.   MULTIPLE VITAMIN (MULTIVITAMIN WITH MINERALS) TABS TABLET    Take 1 tablet by mouth daily.   ODEFSEY 200-25-25 MG TABS TABLET    TAKE ONE TABLET BY MOUTH ONCE DAILY WITH A MEAL. STORE IN ORIGINAL  CONTAINER AT ROOM TEMPERATURE.   SPIRONOLACTONE (ALDACTONE) 25 MG TABLET    TAKE 1 TABLET DAILY   TIVICAY 50 MG TABLET    TAKE ONE TABLET (50 MG) BY MOUTH ONCE DAILY WITH A MEAL. STORE AT [facility].   UNABLE TO FIND    Med Name: Theracurmin 60 mg po daily  Modified Medications   No medications on file  Discontinued Medications   GARLIC 0388 MG CAPS    Take 1,000 mg by mouth daily as needed.     Subjective: Holly Stephens is in for her routine HIV follow-up visit.  She has had no problems obtaining, taking or tolerating her Odefsey or Tivicay.  She uses her pillbox and never misses a single dose.  It appears that her Hodgkin's lymphoma is in remission.  She will undergo repeat scans in October and is hoping to have her Port-A-Cath removed.  She is feeling well.  She is very happy and just bought a new Buick.  Review of Systems: Review of Systems  Constitutional: Negative for chills, diaphoresis, fever, malaise/fatigue and weight loss.  HENT: Negative for sore throat.   Respiratory: Negative for cough, sputum production and shortness of breath.   Cardiovascular: Negative for chest pain.  Gastrointestinal: Negative for abdominal pain, diarrhea, heartburn, nausea and vomiting.  Genitourinary: Negative for  dysuria and frequency.  Musculoskeletal: Positive for joint pain. Negative for myalgias.  Skin: Negative for rash.  Neurological: Negative for dizziness and headaches.  Psychiatric/Behavioral: Negative for depression and substance abuse. The patient is not nervous/anxious.     Past Medical History:  Diagnosis Date  . Acute deep vein thrombosis (DVT) of popliteal vein of left lower extremity ([place]) 02/09/2016  . Arthritis    osteoarthritis  . Diabetes mellitus without complication ([place])   . History of radiation therapy 04/25/2016 - 05/23/2016   Left Axilla treated to 36 Gy in 20 fractions  . HIV infection ([place])   . [name], nodular sclerosis ([place]) 08/23/2015  . Human  immunodeficiency virus (HIV) disease ([place]) 02/23/2006   Qualifier: Diagnosis of  By: Megan Salon MD, Keigo Whalley      Social History   Tobacco Use  . Smoking status: Never Smoker  . Smokeless tobacco: Never Used  Substance Use Topics  . Alcohol use: No  . Drug use: No    Family History  Problem Relation Age of Onset  . Hypertension Mother   . Cancer Mother 2       breast   . Hypertension Father   . Heart attack Father   . Pulmonary embolism Sister   . Parkinson's disease Brother     Allergies  Allergen Reactions  . Augmentin [Amoxicillin-Pot Clavulanate] Hives and Other (See Comments)    Has patient had a PCN reaction causing immediate rash, facial/tongue/throat swelling, SOB or lightheadedness with hypotension: No Has patient had a PCN reaction causing severe rash involving mucus membranes or skin necrosis: No Has patient had a PCN reaction that required hospitalization No Has patient had a PCN reaction occurring within the last 10 years: No If all of the above answers are "NO", then may proceed with Cephalosporin use.  . Bleomycin Cough    Health Maintenance  Topic Date Due  . HEMOGLOBIN A1C  15-Sep-1955  . FOOT EXAM  06/26/1965  . OPHTHALMOLOGY EXAM  06/26/1965  . URINE MICROALBUMIN  06/26/1965  . COLONOSCOPY  06/26/2005  . PNEUMOCOCCAL POLYSACCHARIDE VACCINE (2) 11/17/2010  . PAP SMEAR  09/15/2017  . MAMMOGRAM  06/13/2019  . TETANUS/TDAP  11/06/2022  . Hepatitis C Screening  Completed  . HIV Screening  Completed    Objective:  Vitals:   09/19/17 1346  BP: (!) 114/58  Pulse: 76  Temp: 98 F (36.7 C)  TempSrc: Oral  Weight: 273 lb 12.8 oz (124.2 kg)   Body mass index is 51.73 kg/m.  Physical Exam  Constitutional: She is oriented to person, place, and time.  She is happy and in good spirits as usual.  HENT:  Mouth/Throat: No oropharyngeal exudate.  Eyes: Conjunctivae are normal.  Cardiovascular: Normal rate and regular rhythm.  No murmur  heard. Pulmonary/Chest: Breath sounds normal.  Abdominal: Soft. She exhibits no mass. There is no tenderness.  No change in her morbid obesity.  Musculoskeletal: Normal range of motion.  Neurological: She is alert and oriented to person, place, and time.  Skin: No rash noted.  Psychiatric: She has a normal mood and affect.    Lab Results Lab Results  Component Value Date   WBC 7.6 09/06/2017   HGB 15.7 (H) 09/06/2017   HCT 45.1 (H) 09/06/2017   MCV 90.0 09/06/2017   PLT 271 09/06/2017    Lab Results  Component Value Date   CREATININE 0.70 09/06/2017   BUN 14 09/06/2017   NA 139 09/06/2017   K 4.4 09/06/2017  CL 101 09/06/2017   CO2 28 09/06/2017    Lab Results  Component Value Date   ALT 65 (H) 09/06/2017   AST 55 (H) 09/06/2017   ALKPHOS 44 05/23/2017   BILITOT 0.6 09/06/2017    Lab Results  Component Value Date   CHOL 272 (H) 09/06/2017   HDL 35 (L) 09/06/2017   LDLCALC 179 (H) 09/06/2017   TRIG 342 (H) 09/06/2017   CHOLHDL 7.8 (H) 09/06/2017   Lab Results  Component Value Date   LABRPR NON-REACTIVE 09/06/2017   HIV 1 RNA Quant (copies/mL)  Date Value  09/06/2017 <20 NOT DETECTED  09/05/2016 <20 DETECTED (A)  03/02/2016 <20   CD4 T Cell Abs (/uL)  Date Value  09/06/2017 790  09/05/2016 810  03/02/2016 530     Problem List Items Addressed This Visit      High   Human immunodeficiency virus (HIV) disease ([place])    Her infection remains under excellent, long-term control.  She will continue her current antiretroviral regimen and follow-up after lab work in 1 year.      Relevant Orders   CBC   T-helper cell (CD4)- ([facility] clinic only)   Comprehensive metabolic panel   Lipid panel   RPR   HIV 1 RNA quant-no reflex-bld        Michel Bickers, MD [facility] for [facility] [place] [phone] pager   [phone] cell 09/19/2017, 2:14 PM

## 2017-09-19 NOTE — Assessment & Plan Note (Signed)
Her infection remains under excellent, long-term control.  She will continue her current antiretroviral regimen and follow-up after lab work in 1 year. °

## 2017-09-20 ENCOUNTER — Ambulatory Visit: Payer: 59 | Admitting: Internal Medicine

## 2017-09-24 ENCOUNTER — Other Ambulatory Visit: Payer: Self-pay

## 2017-09-24 ENCOUNTER — Inpatient Hospital Stay (HOSPITAL_COMMUNITY): Payer: 59 | Attending: Oncology

## 2017-09-24 ENCOUNTER — Encounter (HOSPITAL_COMMUNITY): Payer: Self-pay

## 2017-09-24 DIAGNOSIS — Z452 Encounter for adjustment and management of vascular access device: Secondary | ICD-10-CM | POA: Diagnosis not present

## 2017-09-24 DIAGNOSIS — C8118 Nodular sclerosis classical Hodgkin lymphoma, lymph nodes of multiple sites: Secondary | ICD-10-CM | POA: Insufficient documentation

## 2017-09-24 MED ORDER — SODIUM CHLORIDE 0.9% FLUSH
10.0000 mL | INTRAVENOUS | Status: DC | PRN
  Administered 2017-09-24: 10 mL via INTRAVENOUS
  Filled 2017-09-24: qty 10

## 2017-09-24 MED ORDER — HEPARIN SOD (PORK) LOCK FLUSH 100 UNIT/ML IV SOLN
500.0000 [IU] | Freq: Once | INTRAVENOUS | Status: AC
  Administered 2017-09-24: 500 [IU] via INTRAVENOUS

## 2017-09-24 NOTE — Progress Notes (Signed)
Holly Stephens presented for Portacath access and flush. Portacath located rt chest wall accessed with  H 20 needle. Good blood return present. Portacath flushed with 82ml NS and 500U/90ml Heparin and needle removed intact. Procedure without incident. Patient tolerated procedure well.

## 2017-09-26 ENCOUNTER — Ambulatory Visit (INDEPENDENT_AMBULATORY_CARE_PROVIDER_SITE_OTHER): Payer: Self-pay | Admitting: Podiatry

## 2017-09-26 ENCOUNTER — Encounter: Payer: Self-pay | Admitting: Podiatry

## 2017-09-26 DIAGNOSIS — M79676 Pain in unspecified toe(s): Secondary | ICD-10-CM

## 2017-09-26 DIAGNOSIS — B351 Tinea unguium: Secondary | ICD-10-CM

## 2017-09-26 DIAGNOSIS — E0843 Diabetes mellitus due to underlying condition with diabetic autonomic (poly)neuropathy: Secondary | ICD-10-CM

## 2017-09-30 NOTE — Progress Notes (Signed)
   SUBJECTIVE Patient with a history of diabetes mellitus presents to office today complaining of elongated, thickened nails that cause pain while ambulating in shoes. She is unable to trim her own nails. Patient is here for further evaluation and treatment.   Past Medical History:  Diagnosis Date  . Acute deep vein thrombosis (DVT) of popliteal vein of left lower extremity ([place]) 02/09/2016  . Arthritis    osteoarthritis  . Diabetes mellitus without complication ([place])   . History of radiation therapy 04/25/2016 - 05/23/2016   Left Axilla treated to 36 Gy in 20 fractions  . HIV infection ([place])   . [name], nodular sclerosis ([place]) 08/23/2015  . Human immunodeficiency virus (HIV) disease ([place]) 02/23/2006   Qualifier: Diagnosis of  By: Megan Salon MD, [place] Patient is awake, alert, and oriented x 3 and in no acute distress. Derm Skin is dry and supple bilateral. Negative open lesions or macerations. Remaining integument unremarkable. Nails are tender, long, thickened and dystrophic with subungual debris, consistent with onychomycosis, 1-5 bilateral. No signs of infection noted. Vasc  DP and PT pedal pulses palpable bilaterally. Temperature gradient within normal limits.  Neuro Epicritic and protective threshold sensation diminished bilaterally.  Musculoskeletal Exam No symptomatic pedal deformities noted bilateral. Muscular strength within normal limits.  ASSESSMENT 1. Diabetes Mellitus w/ peripheral neuropathy 2. Onychomycosis of nail due to dermatophyte bilateral 3. Pain in foot bilateral  PLAN OF CARE 1. Patient evaluated today. 2. Instructed to maintain good pedal hygiene and foot care. Stressed importance of controlling blood sugar.  3. Mechanical debridement of nails 1-5 bilaterally performed using a nail nipper. Filed with dremel without incident.  4. Return to clinic in 3 mos.     Edrick Kins, DPM [facility]  Dr. Edrick Kins,  [address]                                        [place], [place] [address]                Office [phone]  Fax [phone]

## 2017-10-15 DIAGNOSIS — J019 Acute sinusitis, unspecified: Secondary | ICD-10-CM | POA: Diagnosis not present

## 2017-11-05 ENCOUNTER — Other Ambulatory Visit: Payer: Self-pay | Admitting: Internal Medicine

## 2017-11-05 DIAGNOSIS — B20 Human immunodeficiency virus [HIV] disease: Secondary | ICD-10-CM

## 2017-11-13 DIAGNOSIS — Z23 Encounter for immunization: Secondary | ICD-10-CM | POA: Diagnosis not present

## 2017-11-19 DIAGNOSIS — E782 Mixed hyperlipidemia: Secondary | ICD-10-CM | POA: Diagnosis not present

## 2017-11-19 DIAGNOSIS — E119 Type 2 diabetes mellitus without complications: Secondary | ICD-10-CM | POA: Diagnosis not present

## 2017-11-19 DIAGNOSIS — I10 Essential (primary) hypertension: Secondary | ICD-10-CM | POA: Diagnosis not present

## 2017-11-19 DIAGNOSIS — E1169 Type 2 diabetes mellitus with other specified complication: Secondary | ICD-10-CM | POA: Diagnosis not present

## 2017-11-23 ENCOUNTER — Inpatient Hospital Stay (HOSPITAL_COMMUNITY): Payer: 59 | Attending: Oncology

## 2017-11-23 ENCOUNTER — Encounter (HOSPITAL_COMMUNITY): Payer: Self-pay

## 2017-11-23 DIAGNOSIS — Z9221 Personal history of antineoplastic chemotherapy: Secondary | ICD-10-CM | POA: Diagnosis not present

## 2017-11-23 DIAGNOSIS — Z79899 Other long term (current) drug therapy: Secondary | ICD-10-CM | POA: Insufficient documentation

## 2017-11-23 DIAGNOSIS — Z923 Personal history of irradiation: Secondary | ICD-10-CM | POA: Diagnosis not present

## 2017-11-23 DIAGNOSIS — I509 Heart failure, unspecified: Secondary | ICD-10-CM | POA: Diagnosis not present

## 2017-11-23 DIAGNOSIS — E1169 Type 2 diabetes mellitus with other specified complication: Secondary | ICD-10-CM | POA: Diagnosis not present

## 2017-11-23 DIAGNOSIS — Z8249 Family history of ischemic heart disease and other diseases of the circulatory system: Secondary | ICD-10-CM | POA: Insufficient documentation

## 2017-11-23 DIAGNOSIS — Z452 Encounter for adjustment and management of vascular access device: Secondary | ICD-10-CM | POA: Insufficient documentation

## 2017-11-23 DIAGNOSIS — I110 Hypertensive heart disease with heart failure: Secondary | ICD-10-CM | POA: Insufficient documentation

## 2017-11-23 DIAGNOSIS — B20 Human immunodeficiency virus [HIV] disease: Secondary | ICD-10-CM | POA: Diagnosis not present

## 2017-11-23 DIAGNOSIS — R918 Other nonspecific abnormal finding of lung field: Secondary | ICD-10-CM | POA: Insufficient documentation

## 2017-11-23 DIAGNOSIS — C8118 Nodular sclerosis classical Hodgkin lymphoma, lymph nodes of multiple sites: Secondary | ICD-10-CM | POA: Insufficient documentation

## 2017-11-23 DIAGNOSIS — R590 Localized enlarged lymph nodes: Secondary | ICD-10-CM | POA: Insufficient documentation

## 2017-11-23 DIAGNOSIS — E782 Mixed hyperlipidemia: Secondary | ICD-10-CM | POA: Diagnosis not present

## 2017-11-23 LAB — CBC WITH DIFFERENTIAL/PLATELET
BASOS ABS: 0 10*3/uL (ref 0.0–0.1)
BASOS PCT: 0 %
Eosinophils Absolute: 0.1 10*3/uL (ref 0.0–0.7)
Eosinophils Relative: 2 %
HEMATOCRIT: 46.8 % — AB (ref 36.0–46.0)
HEMOGLOBIN: 15.3 g/dL — AB (ref 12.0–15.0)
Lymphocytes Relative: 36 %
Lymphs Abs: 2.9 10*3/uL (ref 0.7–4.0)
MCH: 31.2 pg (ref 26.0–34.0)
MCHC: 32.7 g/dL (ref 30.0–36.0)
MCV: 95.3 fL (ref 78.0–100.0)
MONO ABS: 0.6 10*3/uL (ref 0.1–1.0)
Monocytes Relative: 7 %
NEUTROS ABS: 4.3 10*3/uL (ref 1.7–7.7)
Neutrophils Relative %: 55 %
Platelets: 237 10*3/uL (ref 150–400)
RBC: 4.91 MIL/uL (ref 3.87–5.11)
RDW: 13.4 % (ref 11.5–15.5)
WBC: 7.9 10*3/uL (ref 4.0–10.5)

## 2017-11-23 LAB — COMPREHENSIVE METABOLIC PANEL
ALT: 47 U/L — AB (ref 0–44)
AST: 41 U/L (ref 15–41)
Albumin: 4.3 g/dL (ref 3.5–5.0)
Alkaline Phosphatase: 33 U/L — ABNORMAL LOW (ref 38–126)
Anion gap: 13 (ref 5–15)
BUN: 19 mg/dL (ref 8–23)
CHLORIDE: 104 mmol/L (ref 98–111)
CO2: 25 mmol/L (ref 22–32)
CREATININE: 0.7 mg/dL (ref 0.44–1.00)
Calcium: 9.9 mg/dL (ref 8.9–10.3)
GFR calc non Af Amer: >60 mL/min (ref >60)
Glucose, Bld: 115 mg/dL — ABNORMAL HIGH (ref 70–99)
POTASSIUM: 4.1 mmol/L (ref 3.5–5.1)
Sodium: 142 mmol/L (ref 135–145)
Total Bilirubin: 0.7 mg/dL (ref 0.3–1.2)
Total Protein: 7.6 g/dL (ref 6.5–8.1)

## 2017-11-23 LAB — LACTATE DEHYDROGENASE: LDH: 130 U/L (ref 98–192)

## 2017-11-23 LAB — SEDIMENTATION RATE: SED RATE: 6 mm/h (ref 0–22)

## 2017-11-23 MED ORDER — HEPARIN SOD (PORK) LOCK FLUSH 100 UNIT/ML IV SOLN
500.0000 [IU] | Freq: Once | INTRAVENOUS | Status: AC
  Administered 2017-11-23: 500 [IU] via INTRAVENOUS

## 2017-11-23 MED ORDER — SODIUM CHLORIDE 0.9% FLUSH
20.0000 mL | INTRAVENOUS | Status: DC | PRN
  Administered 2017-11-23: 20 mL via INTRAVENOUS
  Filled 2017-11-23: qty 20

## 2017-11-23 NOTE — Progress Notes (Signed)
Holly Stephens tolerated port flush well without complaints or incident. Labs drawn peripherally since no blood return given with port flush. VSS Pt discharged self ambulatory in satisfactory condition

## 2017-11-23 NOTE — Patient Instructions (Signed)
[facility] Discharge Instructions  Portacath flushed per protocol today. Follow-up as scheduled. Call clinic for any questions or concerns   Thank you for choosing [facility] at [facility] to provide your oncology and hematology care.  To afford each patient quality time with our provider, please arrive at least 15 minutes before your scheduled appointment time.   If you have a lab appointment with the Cancer Center please come in thru the  Main Entrance and check in at the main information desk  You need to re-schedule your appointment should you arrive 10 or more minutes late.  We strive to give you quality time with our providers, and arriving late affects you and other patients whose appointments are after yours.  Also, if you no show three or more times for appointments you may be dismissed from the clinic at the providers discretion.     Again, thank you for choosing [facility].  Our hope is that these requests will decrease the amount of time that you wait before being seen by our physicians.       _____________________________________________________________  Should you have questions after your visit to [facility], please contact our office at [phone] between the hours of 8:00 a.m. and 4:30 p.m.  Voicemails left after 4:00 p.m. will not be returned until the following business day.  For prescription refill requests, have your pharmacy contact our office and allow 72 hours.    Cancer Center Support Programs:   > Cancer Support Group  2nd Tuesday of the month 1pm-2pm, Journey Room   

## 2017-11-27 ENCOUNTER — Ambulatory Visit (HOSPITAL_COMMUNITY)
Admission: RE | Admit: 2017-11-27 | Discharge: 2017-11-27 | Disposition: A | Discharge status: Home / Self Care | Payer: 59 | Source: Ambulatory Visit | Attending: Internal Medicine | Admitting: Internal Medicine

## 2017-11-27 DIAGNOSIS — C8118 Nodular sclerosis classical Hodgkin lymphoma, lymph nodes of multiple sites: Secondary | ICD-10-CM | POA: Diagnosis not present

## 2017-11-27 DIAGNOSIS — R590 Localized enlarged lymph nodes: Secondary | ICD-10-CM | POA: Diagnosis not present

## 2017-11-27 DIAGNOSIS — K439 Ventral hernia without obstruction or gangrene: Secondary | ICD-10-CM | POA: Diagnosis not present

## 2017-11-27 DIAGNOSIS — Z09 Encounter for follow-up examination after completed treatment for conditions other than malignant neoplasm: Secondary | ICD-10-CM | POA: Diagnosis not present

## 2017-11-27 MED ORDER — IOPAMIDOL (ISOVUE-300) INJECTION 61%
100.0000 mL | Freq: Once | INTRAVENOUS | Status: AC | PRN
  Administered 2017-11-27: 100 mL via INTRAVENOUS

## 2017-11-30 ENCOUNTER — Inpatient Hospital Stay (HOSPITAL_BASED_OUTPATIENT_CLINIC_OR_DEPARTMENT_OTHER): Payer: 59 | Admitting: Internal Medicine

## 2017-11-30 ENCOUNTER — Encounter (HOSPITAL_COMMUNITY): Payer: Self-pay | Admitting: Internal Medicine

## 2017-11-30 ENCOUNTER — Other Ambulatory Visit (HOSPITAL_COMMUNITY): Payer: Self-pay | Admitting: Internal Medicine

## 2017-11-30 ENCOUNTER — Other Ambulatory Visit: Payer: Self-pay

## 2017-11-30 VITALS — BP 114/46 | HR 73 | Temp 97.5°F | Resp 18 | Wt 271.4 lb

## 2017-11-30 DIAGNOSIS — Z923 Personal history of irradiation: Secondary | ICD-10-CM

## 2017-11-30 DIAGNOSIS — I509 Heart failure, unspecified: Secondary | ICD-10-CM

## 2017-11-30 DIAGNOSIS — Z9221 Personal history of antineoplastic chemotherapy: Secondary | ICD-10-CM

## 2017-11-30 DIAGNOSIS — C8118 Nodular sclerosis classical Hodgkin lymphoma, lymph nodes of multiple sites: Secondary | ICD-10-CM | POA: Diagnosis not present

## 2017-11-30 DIAGNOSIS — I110 Hypertensive heart disease with heart failure: Secondary | ICD-10-CM

## 2017-11-30 DIAGNOSIS — Z8249 Family history of ischemic heart disease and other diseases of the circulatory system: Secondary | ICD-10-CM

## 2017-11-30 DIAGNOSIS — Z1231 Encounter for screening mammogram for malignant neoplasm of breast: Secondary | ICD-10-CM

## 2017-11-30 DIAGNOSIS — R918 Other nonspecific abnormal finding of lung field: Secondary | ICD-10-CM

## 2017-11-30 DIAGNOSIS — R590 Localized enlarged lymph nodes: Secondary | ICD-10-CM

## 2017-11-30 DIAGNOSIS — B20 Human immunodeficiency virus [HIV] disease: Secondary | ICD-10-CM | POA: Diagnosis not present

## 2017-11-30 DIAGNOSIS — Z79899 Other long term (current) drug therapy: Secondary | ICD-10-CM

## 2017-11-30 NOTE — Progress Notes (Signed)
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  Hodgkin's Lymphoma, nodular-sclerosing type involving multiple lymph nodes:  -CD30, CD15, & PAX-5 positive; s/p ABVD chemotherapy beginning on 09/13/15, with discontinuation of Bleomycin on 10/11/15.  Mid-course PET scan revealed Deauville 3 lymphadenopathy, so she received 4 additional cycles of AVD.  Chemo course was complicated by diastolic heart failure (followed by Dr. Haroldine Laws), in addition to HIV (followed by Dr. Megan Salon with [facility]). Chemotherapy completed on 02/23/16. Post-treatment PET revealed mild residual (L) axillary adenopathy (Deauville 2), so she was referred for axillary radiation therapy.  -Completed XRT to left axilla on 05/23/16.   Last Pet scan done 07/24/2016 showed:   IMPRESSION: 1. Stable exam. The mild residual low level metabolic activity within left axillary lymph nodes a similar to prior (Deauville 2). 2. No new or progressive findings. 3. Abdominal aortic atherosclerosis.  She had CT CAP done 05/29/2017 that showed  IMPRESSION: 1. Several borderline enlarged and mildly enlarged left axillary lymph nodes appear slightly decreased in size compared to prior study from October 2018. This could reflect residual disease. No new or enlarging lymph nodes are noted elsewhere in the chest, abdomen or pelvis. 2. Hepatic steatosis. 3. Aortic atherosclerosis, in addition to left anterior descending coronary artery disease. Please note that although the presence  of coronary artery calcium documents the presence of coronary artery disease, the severity of this disease and any potential stenosis cannot be assessed on this non-gated CT examination. Assessment for potential risk factor modification, dietary therapy or pharmacologic therapy may be warranted, if clinically indicated. 4. Multiple tiny 2-4 mm pulmonary nodules, stable compared to the prior examination, favored to be benign. Attention at time of routine follow-up imaging is recommended to ensure continued stability. 5. Additional incidental findings, as above.  Patient reports she had not undergone mammogram due to still having PAC.  Pt was set up for bilateral diagnostic mammogram for further evaluation of axillary adenopathy and based on her history of Hodgkin's lymphoma. Mammogram was done 06/12/2017 and showed no findings concerning for malignancy.  She is recommended for screening mammogram in 05/2018 which is set up today.    CT CAP done 11/27/2017 reviewed and showed  IMPRESSION: 1. Interval decrease in size of the axillary lymph nodes. No new or progressive findings. 2. No adenopathy in the mediastinum/hilum, abdomen/pelvis or inguinal regions. 3. No acute pulmonary findings or acute abdominal/pelvic findings. 4. Moderate-sized lower anterior abdominal wall hernia containing Fat.  Labs done 11/23/2017 reviewed and showed WBC 7.9 HB 15.3 plts 237,000.  Chemistries WNL with K+ 4.1, Cr 0.7 normal LFTs and LDH 130.   Sed rate WNL at 6.    No palpable adenopathy noted on exam.  Pt will be set up for CT CAP and screening mammogram in 05/2018 and will follow-up at that time with labs.  She is referred to surgery for port removal.    2.  [name].  Continue follow-up with Dr. Megan Salon of [facility].    3.  Hypertension.  Blood pressure is 114/46.  Follow-up with PCP.    4.  Elevated liver function tests.  Improved on recent labs.  CT abdomen done 11/27/2017 showed no liver lesions.  Will  repeat labs on RTC.    5.  PAC.  Pt is referred to surgery for PAC removal.    Greater than 25 minutes spent with more than 50% spent in counseling and coordination of care.    Current Status: Patient is seen today for follow-up.  She is here to go over her CT scans, mammogram and labs.  She desires to have port removed.      [name], nodular sclerosis ([place])   07/23/2015 Procedure    Lext axilla lymph node biopsy    07/27/2015 Pathology Results    Classical [name], nodular sclerosis type.  Positive CD30, CD15, andPAX-5.  Negative CD45 and CD20.    08/31/2015 PET scan    Hypermetabolic lymphadenopathy. Nodal stations include: L supraclavicular nodes, L axillary nodes, mediastinal lymph nodes, periaortic retroperitoneal and periportal lymph nodes as well as L iliac and inguinal lymph nodes. Two concerning pulm nodules...    09/01/2015 Imaging    MUGA- Normal LEFT ventricular ejection fraction of 73%.  Normal LEFT ventricular wall motion.    09/01/2015 Procedure    PFTs-  Spirometry shows mild ventilatory defect without airflow obstruction and no bronchodilator improvement. Lung volumes are normal. Airway resistance is normal. DLCO normal. Essentially normal lung function    09/08/2015 Procedure    Port placed by Dr. Rosana Hoes    09/13/2015 - 02/23/2016 Chemotherapy    ABVD    09/27/2015 Imaging    CT angio of chest- no gross PE seen. Improving axillary, mediastinal and upper abdominal adenopathy. Improved and possibly resolved RUL nodule and with resolved or obscured LLL nodule. Small L pleural effusion and mild L lower lobe atelectasis.    10/11/2015 Treatment Plan Change    Bleomycin discontinued     11/01/2015 PET scan    Clear interval response to therapy with decrease in size and hypermetabolism of larger lymph nodes seen previously and some of the smaller hypermetabolic lymph node seen on the previous study have resolved completely in terms of abnormal enlargement and  hypermetabolism. 2. Slight decrease in left lower lobe pulmonary nodule with interval resolution of the right upper lobe pulmonary nodule. 3. Stable appearance of mild heterogeneous FDG accumulation within the marrow space.    11/01/2015 Miscellaneous    Deauville 3- 4 more cycles of AVD recommended according to NCCN guidelines.    02/09/2016 Imaging    [place] left leg- Positive for acute DVT in left calf, popliteal and femoral veins.    03/22/2016 PET scan    1. Mild residual metabolic activity within LEFT axillary lymph nodes ( Deauville 2 ). 2. No additional hypermetabolic adenopathy identified. 3. Normal spleen and marrow activity.    04/25/2016 - 05/23/2016 Radiation Therapy    Residual disease treated with radiation Isidore Moos): Left axilla treated with 36 Gy in 20 fractions.    07/24/2016 PET scan    PET IMPRESSION: 1. Stable exam. The mild residual low level metabolic activity within left axillary lymph nodes a similar to prior (Deauville 2). 2. No new or progressive findings. 3. Abdominal aortic atherosclerosis.     11/23/2016 Imaging    CT C/A/P: 1. Essentially stable left axillary adenopathy. No new adenopathy identified. 2.  Aortic Atherosclerosis (ICD10-I70.0). 3. Small left adrenal adenoma. 4. Midline ventral hernia contains about 630 cc of omental adipose tissue.      Problem List Patient Active Problem List   Diagnosis Date Noted  .  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[place]) [C81.14] 04/05/2016  . Acute deep vein thrombosis (DVT) of popliteal vein of left lower extremity ([place]) [I82.432] 02/09/2016  . Unintentional weight loss [R63.4] 12/16/2015  . 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 Acute congestive heart failure ([place]) [I50.9] 10/27/2015  . [name], nodular sclerosis ([place]) [C81.10] 08/23/2015  . Visual floaters [H43.399] 09/12/2011  . Episodic low back pain [M54.5] 11/15/2010  . ROTATOR  CUFF INJURY, RIGHT SHOULDER [M71.9, M67.919] 11/03/2008  . ADHESIVE CAPSULITIS OF SHOULDER [M75.00] 11/12/2007  . OBESITY NOS [E66.9] 05/17/2006  . SYNDROME, CARPAL TUNNEL [G56.00] 05/17/2006  . FOOT PAIN [M79.609] 05/17/2006  . Human immunodeficiency virus (HIV) disease ([place]) [B20] 02/23/2006  . Hyperlipidemia [E78.5] 02/23/2006  . DEPRESSION [F32.9] 02/23/2006  . Essential hypertension [I10] 02/23/2006    Past Medical History Past Medical History:  Diagnosis Date  . Acute deep vein thrombosis (DVT) of popliteal vein of left lower extremity ([place]) 02/09/2016  . Arthritis    osteoarthritis  . Diabetes mellitus without complication ([place])   . History of radiation therapy 04/25/2016 - 05/23/2016   Left Axilla treated to 36 Gy in 20 fractions  . HIV infection ([place])   . [name], nodular sclerosis ([place]) 08/23/2015  . Human immunodeficiency virus (HIV) disease ([place]) 02/23/2006   Qualifier: Diagnosis of  By: Megan Salon MD, John      Past Surgical History Past Surgical History:  Procedure Laterality Date  . AXILLARY LYMPH NODE BIOPSY Left 07/23/2015   Procedure: EXCISIONAL BIOPSY LEFT AXILLARY LYMPH NODE;  Surgeon: [name], MD;  Location: [facility];  Service: General;  Laterality: Left;  . PORTACATH PLACEMENT Right 09/08/2015   Procedure: INSERTION OF CENTRAL VENOUS CATHETER WITH PORT FOR CHEMOTHERAPY;  Surgeon: [name], MD;  Location: [facility];  Service: General;  Laterality: Right;  . SHOULDER SURGERY Right    removal of bone spur    Family History Family History  Problem Relation Age of Onset  . Hypertension Mother   . Cancer Mother 22       breast   . Hypertension Father   . Heart attack Father   . Pulmonary embolism Sister   . Parkinson's disease Brother      Social History  reports that she has never smoked. She has never used smokeless tobacco. She reports that she does not drink alcohol or use drugs.  Medications  Current Outpatient Medications:  .   cholecalciferol (VITAMIN D) 1000 units tablet, Take 2,000 Units by mouth daily., Disp: , Rfl:  .  fenofibrate 160 MG tablet, Take 160 mg by mouth daily. , Disp: , Rfl:  .  fexofenadine (ALLEGRA) 180 MG tablet, Take 180 mg by mouth daily as needed. , Disp: , Rfl:  .  fluticasone (FLONASE) 50 MCG/ACT nasal spray, ONE SPRAY TO EACH NOSTRIL DAILY, Disp: , Rfl: 0 .  furosemide (LASIX) 20 MG tablet, Take 1 tablet (20 mg total) by mouth daily., Disp: 90 tablet, Rfl: 2 .  glucosamine-chondroitin 500-400 MG tablet, Take 2 tablets by mouth every morning., Disp: , Rfl:  .  Icosapent Ethyl (VASCEPA) 1 g CAPS, Take by mouth., Disp: , Rfl:  .  JANUMET XR [phone] MG TB24, Take 1 tablet by mouth 2 (two) times daily., Disp: , Rfl: 0 .  JARDIANCE 25 MG TABS tablet, Take 25 mg by mouth daily. , Disp: , Rfl:  .  lidocaine-prilocaine (EMLA) cream, Apply 1 application topically as needed (  prior to accessing port)., Disp: , Rfl:  .  metoprolol tartrate (LOPRESSOR) 25 MG tablet, Take 1 tablet (25 mg total) by mouth daily., Disp: 30 tablet, Rfl: 0 .  Multiple Vitamin (MULTIVITAMIN WITH MINERALS) TABS tablet, Take 1 tablet by mouth daily., Disp: , Rfl:  .  ODEFSEY 200-25-25 MG TABS tablet, TAKE ONE TABLET BY MOUTH ONCE DAILY WITH A MEAL. STORE IN ORIGINAL CONTAINER AT ROOM TEMPERATURE., Disp: 90 tablet, Rfl: 1 .  spironolactone (ALDACTONE) 25 MG tablet, TAKE 1 TABLET DAILY, Disp: 90 tablet, Rfl: 3 .  TIVICAY 50 MG tablet, TAKE 1 TABLET BY MOUTH ONCE DAILY WITH A MEAL.  STORE AT ROOM TEMPERATURE., Disp: 90 tablet, Rfl: 1  Allergies Augmentin [amoxicillin-pot clavulanate] and Bleomycin  Review of Systems Review of Systems - Oncology ROS negative   Physical Exam  Vitals Wt Readings from Last 3 Encounters:  11/30/17 271 lb 6.4 oz (123.1 kg)  09/19/17 273 lb 12.8 oz (124.2 kg)  02/28/17 280 lb 9.6 oz (127.3 kg)   Temp Readings from Last 3 Encounters:  11/30/17 (!) 97.5 F (36.4 C) (Oral)  11/23/17 (!) 97.5 F  (36.4 C) (Oral)  09/24/17 97.8 F (36.6 C) (Oral)   BP Readings from Last 3 Encounters:  11/30/17 (!) 114/46  11/23/17 110/81  09/24/17 139/69   Pulse Readings from Last 3 Encounters:  11/30/17 73  11/23/17 77  09/24/17 72   Constitutional: Well-developed, well-nourished, and in no distress.   HENT: Head: Normocephalic and atraumatic.  Mouth/Throat: No oropharyngeal exudate. Mucosa moist. Eyes: Pupils are equal, round, and reactive to light. Conjunctivae are normal. No scleral icterus.  Neck: Normal range of motion. Neck supple. No JVD present.  Cardiovascular: Normal rate, regular rhythm and normal heart sounds.  Exam reveals no gallop and no friction rub.   No murmur heard. Pulmonary/Chest: Effort normal and breath sounds normal. No respiratory distress. No wheezes.No rales.  Abdominal: Soft. Bowel sounds are normal. Obese.  Evidence of large hernia.  NT, no guarding.   Musculoskeletal: No edema or tenderness.  Lymphadenopathy: No cervical, axillary or supraclavicular adenopathy.  Neurological: Alert and oriented to person, place, and time. No cranial nerve deficit.  Skin: Skin is warm and dry. No rash noted. No erythema. No pallor.  Psychiatric: Affect and judgment normal.   Labs No visits with results within 3 Day(s) from this visit.  Latest known visit with results is:  Infusion on 11/23/2017  Component Date Value Ref Range Status  . Sed Rate 11/23/2017 6  0 - 22 mm/hr Final   Performed at [facility], [address], [place], [place] [address]  . LDH 11/23/2017 130  98 - 192 U/L Final   Performed at [facility], [address] [place], [place] [address]  . Sodium 11/23/2017 142  135 - 145 mmol/L Final  . Potassium 11/23/2017 4.1  3.5 - 5.1 mmol/L Final  . Chloride 11/23/2017 104  98 - 111 mmol/L Final  . CO2 11/23/2017 25  22 - 32 mmol/L Final  . Glucose, Bld 11/23/2017 115* 70 - 99 mg/dL Final  . BUN 11/23/2017 19  8 - 23 mg/dL Final  . Creatinine, Ser 11/23/2017  0.70  0.44 - 1.00 mg/dL Final  . Calcium 11/23/2017 9.9  8.9 - 10.3 mg/dL Final  . Total Protein 11/23/2017 7.6  6.5 - 8.1 g/dL Final  . Albumin 11/23/2017 4.3  3.5 - 5.0 g/dL Final  . AST 11/23/2017 41  15 - 41 U/L Final  . ALT  11/23/2017 47* 0 - 44 U/L Final  . Alkaline Phosphatase 11/23/2017 33* 38 - 126 U/L Final  . Total Bilirubin 11/23/2017 0.7  0.3 - 1.2 mg/dL Final  . GFR calc non Af Amer 11/23/2017 >60  >60 mL/min Final  . GFR calc Af Amer 11/23/2017 >60  >60 mL/min Final   Comment: (NOTE) The eGFR has been calculated using the CKD EPI equation. This calculation has not been validated in all clinical situations. eGFR's persistently <60 mL/min signify possible Chronic Kidney Disease.   Georgiann Hahn gap 11/23/2017 13  5 - 15 Final   Performed at [facility], [address] [place], [place] [address]  . WBC 11/23/2017 7.9  4.0 - 10.5 K/uL Final  . RBC 11/23/2017 4.91  3.87 - 5.11 MIL/uL Final  . Hemoglobin 11/23/2017 15.3* 12.0 - 15.0 g/dL Final  . HCT 11/23/2017 46.8* 36.0 - 46.0 % Final  . MCV 11/23/2017 95.3  78.0 - 100.0 fL Final  . MCH 11/23/2017 31.2  26.0 - 34.0 pg Final  . MCHC 11/23/2017 32.7  30.0 - 36.0 g/dL Final  . RDW 11/23/2017 13.4  11.5 - 15.5 % Final  . Platelets 11/23/2017 237  150 - 400 K/uL Final  . Neutrophils Relative % 11/23/2017 55  % Final  . Neutro Abs 11/23/2017 4.3  1.7 - 7.7 K/uL Final  . Lymphocytes Relative 11/23/2017 36  % Final  . Lymphs Abs 11/23/2017 2.9  0.7 - 4.0 K/uL Final  . Monocytes Relative 11/23/2017 7  % Final  . Monocytes Absolute 11/23/2017 0.6  0.1 - 1.0 K/uL Final  . Eosinophils Relative 11/23/2017 2  % Final  . Eosinophils Absolute 11/23/2017 0.1  0.0 - 0.7 K/uL Final  . Basophils Relative 11/23/2017 0  % Final  . Basophils Absolute 11/23/2017 0.0  0.0 - 0.1 K/uL Final   Performed at [facility], [address], [place],  [address]     Pathology Orders Placed This Encounter  Procedures  . CT CHEST W CONTRAST     Standing Status:   Future    Standing Expiration Date:   11/30/2018    Order Specific Question:   If indicated for the ordered procedure, I authorize the administration of contrast media per Radiology protocol    Answer:   Yes    Order Specific Question:   Preferred imaging location?    Answer:   [facility]    Order Specific Question:   Radiology Contrast Protocol - do NOT remove file path    Answer:   [email or web address]  . CT ABDOMEN PELVIS W CONTRAST    Standing Status:   Future    Standing Expiration Date:   11/30/2018    Order Specific Question:   If indicated for the ordered procedure, I authorize the administration of contrast media per Radiology protocol    Answer:   Yes    Order Specific Question:   Preferred imaging location?    Answer:   [facility]    Order Specific Question:   Is Oral Contrast requested for this exam?    Answer:   Yes, Per Radiology protocol    Order Specific Question:   Radiology Contrast Protocol - do NOT remove file path    Answer:   \\[email or web address]  . CBC with Differential/Platelet    Standing Status:   Future    Standing Expiration Date:   12/01/2019  . Comprehensive metabolic panel    Standing Status:   Future  Standing Expiration Date:   12/01/2019  . Lactate dehydrogenase    Standing Status:   Future    Standing Expiration Date:   12/01/2019  . Sedimentation rate    Standing Status:   Future    Standing Expiration Date:   12/01/2019       Zoila Shutter MD

## 2017-12-27 ENCOUNTER — Ambulatory Visit (INDEPENDENT_AMBULATORY_CARE_PROVIDER_SITE_OTHER): Payer: 59 | Admitting: Podiatry

## 2017-12-27 DIAGNOSIS — B351 Tinea unguium: Secondary | ICD-10-CM

## 2017-12-27 DIAGNOSIS — M79674 Pain in right toe(s): Secondary | ICD-10-CM | POA: Diagnosis not present

## 2017-12-27 DIAGNOSIS — M79675 Pain in left toe(s): Secondary | ICD-10-CM

## 2017-12-27 NOTE — Patient Instructions (Signed)

## 2018-01-01 ENCOUNTER — Ambulatory Visit (INDEPENDENT_AMBULATORY_CARE_PROVIDER_SITE_OTHER): Payer: 59 | Admitting: General Surgery

## 2018-01-01 ENCOUNTER — Encounter: Payer: Self-pay | Admitting: General Surgery

## 2018-01-01 VITALS — BP 141/74 | HR 80 | Temp 97.3°F | Resp 18 | Wt 270.4 lb

## 2018-01-01 DIAGNOSIS — C8118 Nodular sclerosis classical Hodgkin lymphoma, lymph nodes of multiple sites: Secondary | ICD-10-CM | POA: Diagnosis not present

## 2018-01-01 NOTE — Patient Instructions (Signed)
Implanted Port Removal Implanted port removal is a procedure to remove the port and catheter (port-a-cath) that is implanted under your skin. The port is a small disc under your skin that can be punctured with a needle. It is connected to a vein in your chest or neck by a small flexible tube (catheter). The port-a-cath is used for treatment through an IV tube and for taking blood samples. Your health care provider will remove the port-a-cath if:  You no longer need it for treatment.  It is not working properly.  The area around it gets infected.  Tell a health care provider about:  Any allergies you have.  All medicines you are taking, including vitamins, herbs, eye drops, creams, and over-the-counter medicines.  Any problems you or family members have had with anesthetic medicines.  Any blood disorders you have.  Any surgeries you have had.  Any medical conditions you have.  Whether you are pregnant or may be pregnant. What are the risks? Generally, this is a safe procedure. However, problems may occur, including:  Infection.  Bleeding.  Allergic reactions to anesthetic medicines.  Damage to nerves or blood vessels.  What happens before the procedure?  You will have: ? A physical exam. ? Blood tests. ? Imaging tests, including a chest X-ray.  Follow instructions from your health care provider about eating or drinking restrictions.  Ask your health care provider about: ? Changing or stopping your regular medicines. This is especially important if you are taking diabetes medicines or blood thinners. ? Taking medicines such as aspirin and ibuprofen. These medicines can thin your blood. Do not take these medicines before your procedure if your surgeon instructs you not to.  Ask your health care provider how your surgical site will be marked or identified.  You may be given antibiotic medicine to help prevent infection.  Plan to have someone take you home after the  procedure.  If you will be going home right after the procedure, plan to have someone stay with you for 24 hours. What happens during the procedure?  To reduce your risk of infection: ? Your health care team will wash or sanitize their hands. ? Your skin will be washed with soap.  You may be given one or more of the following: ? A medicine to help you relax (sedative). ? A medicine to numb the area (local anesthetic).  A small cut (incision) will be made at the site of your port-a-cath.  The port-a-cath and the catheter that has been inside your vein will gently be removed.  The incision will be closed with stitches (sutures), adhesive strips, or skin glue.  A bandage (dressing) will be placed over the incision. The procedure may vary among health care providers and hospitals. What happens after the procedure?  Your blood pressure, heart rate, breathing rate, and blood oxygen level will be monitored often until the medicines you were given have worn off.  Do not drive for 24 hours if you received a sedative. This information is not intended to replace advice given to you by your health care provider. Make sure you discuss any questions you have with your health care provider. Document Released: 01/18/2015 Document Revised: 07/15/2015 Document Reviewed: 11/11/2014 Elsevier Interactive Patient Education  2018 [employer]  

## 2018-01-02 NOTE — Progress Notes (Signed)
Holly Stephens; [id number]; [birth year]   HPI Patient is a 62 year old white female who was referred to my care by Dr. Walden Field for Port-A-Cath removal.  She has undergone chemotherapy for Hodgkin's lymphoma and has finished with chemotherapy.  She currently denies any pain. Past Medical History:  Diagnosis Date  . Acute deep vein thrombosis (DVT) of popliteal vein of left lower extremity ([place]) 02/09/2016  . Arthritis    osteoarthritis  . Diabetes mellitus without complication ([place])   . History of radiation therapy 04/25/2016 - 05/23/2016   Left Axilla treated to 36 Gy in 20 fractions  . HIV infection ([place])   . 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 Human immunodeficiency virus (HIV) disease ([place]) 02/23/2006   Qualifier: Diagnosis of  By: Megan Salon MD, John      Past Surgical History:  Procedure Laterality Date  . AXILLARY LYMPH NODE BIOPSY Left 07/23/2015   Procedure: EXCISIONAL BIOPSY LEFT AXILLARY LYMPH NODE;  Surgeon: [name], MD;  Location: [facility];  Service: General;  Laterality: Left;  . PORTACATH PLACEMENT Right 09/08/2015   Procedure: INSERTION OF CENTRAL VENOUS CATHETER WITH PORT FOR CHEMOTHERAPY;  Surgeon: [name], MD;  Location: [facility];  Service: General;  Laterality: Right;  . SHOULDER SURGERY Right    removal of bone spur    Family History  Problem Relation Age of Onset  . Hypertension Mother   . Cancer Mother 77       breast   . Hypertension Father   . Heart attack Father   . Pulmonary embolism Sister   . Parkinson's disease Brother     Current Outpatient Medications on File Prior to Visit  Medication Sig Dispense Refill  . cholecalciferol (VITAMIN D) 1000 units tablet Take 2,000 Units by mouth daily.    . fenofibrate 160 MG tablet Take 160 mg by mouth daily.     . fexofenadine (ALLEGRA) 180 MG tablet Take 180 mg by mouth daily as needed.     . fluticasone (FLONASE) 50 MCG/ACT nasal spray ONE SPRAY TO EACH NOSTRIL DAILY  0  .  furosemide (LASIX) 20 MG tablet Take 1 tablet (20 mg total) by mouth daily. 90 tablet 2  . glucosamine-chondroitin 500-400 MG tablet Take 2 tablets by mouth every morning.    Vanessa Kick Ethyl (VASCEPA) 1 g CAPS Take by mouth.    Marland Kitchen JANUMET XR [phone] MG TB24 Take 1 tablet by mouth 2 (two) times daily.  0  . JARDIANCE 25 MG TABS tablet Take 25 mg by mouth daily.     Marland Kitchen lidocaine-prilocaine (EMLA) cream Apply 1 application topically as needed (prior to accessing port).    . metoprolol tartrate (LOPRESSOR) 25 MG tablet Take 1 tablet (25 mg total) by mouth daily. 30 tablet 0  . Multiple Vitamin (MULTIVITAMIN WITH MINERALS) TABS tablet Take 1 tablet by mouth daily.    . ODEFSEY 200-25-25 MG TABS tablet TAKE ONE TABLET BY MOUTH ONCE DAILY WITH A MEAL. STORE IN ORIGINAL CONTAINER AT ROOM TEMPERATURE. 90 tablet 1  . spironolactone (ALDACTONE) 25 MG tablet TAKE 1 TABLET DAILY 90 tablet 3  . TIVICAY 50 MG tablet TAKE 1 TABLET BY MOUTH ONCE DAILY WITH A MEAL.  STORE AT ROOM TEMPERATURE. 90 tablet 1   No current facility-administered medications on file prior to visit.     Allergies  Allergen Reactions  . Augmentin [Amoxicillin-Pot Clavulanate] Hives and Other (See Comments)    Has patient had a PCN reaction causing immediate  rash, facial/tongue/throat swelling, SOB or lightheadedness with hypotension: No Has patient had a PCN reaction causing severe rash involving mucus membranes or skin necrosis: No Has patient had a PCN reaction that required hospitalization No Has patient had a PCN reaction occurring within the last 10 years: No If all of the above answers are "NO", then may proceed with Cephalosporin use.  . Bleomycin Cough    Social History   Substance and Sexual Activity  Alcohol Use No    Social History   Tobacco Use  Smoking Status Never Smoker  Smokeless Tobacco Never Used    Review of Systems  Constitutional: Negative.   HENT: Negative.   Eyes: Negative.   Respiratory:  Negative.   Cardiovascular: Negative.   Gastrointestinal: Negative.   Genitourinary: Negative.   Musculoskeletal: Negative.   Skin: Negative.   Neurological: Negative.   Endo/Heme/Allergies: Negative.   Psychiatric/Behavioral: Negative.     Objective   Vitals:   01/01/18 1428  BP: (!) 141/74  Pulse: 80  Resp: 18  Temp: (!) 97.3 F (36.3 C)    Physical Exam  Constitutional: She is oriented to person, place, and time. She appears well-developed and well-nourished. No distress.  HENT:  Head: Normocephalic and atraumatic.  Cardiovascular: Normal rate, regular rhythm and normal heart sounds. Exam reveals no gallop and no friction rub.  No murmur heard. Port-A-Cath in place right upper chest  Pulmonary/Chest: Effort normal and breath sounds normal. No stridor. No respiratory distress. She has no wheezes. She has no rales.  Neurological: She is alert and oriented to person, place, and time.  Skin: Skin is warm and dry.  Vitals reviewed. Oncology notes reviewed  Assessment  Hodgkin's lymphoma, finished with chemotherapy Plan   Patient is scheduled for Port-A-Cath removal in the minor procedure room on 01/09/2018.  The risks and benefits of the procedure including bleeding and infection were fully explained to the patient, who gave informed consent.

## 2018-01-02 NOTE — H&P (Signed)
Holly Stephens; [id number]; 15-Dec-1955   HPI Patient is a 62 year old white female who was referred to my care by Dr. Walden Field for Port-A-Cath removal.  She has undergone chemotherapy for Hodgkin's lymphoma and has finished with chemotherapy.  She currently denies any pain. Past Medical History:  Diagnosis Date  . Acute deep vein thrombosis (DVT) of popliteal vein of left lower extremity ([place]) 02/09/2016  . Arthritis    osteoarthritis  . Diabetes mellitus without complication ([place])   . History of radiation therapy 04/25/2016 - 05/23/2016   Left Axilla treated to 36 Gy in 20 fractions  . HIV infection ([place])   . 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 Human immunodeficiency virus (HIV) disease ([place]) 02/23/2006   Qualifier: Diagnosis of  By: Megan Salon MD, John      Past Surgical History:  Procedure Laterality Date  . AXILLARY LYMPH NODE BIOPSY Left 07/23/2015   Procedure: EXCISIONAL BIOPSY LEFT AXILLARY LYMPH NODE;  Surgeon: [name], MD;  Location: [facility];  Service: General;  Laterality: Left;  . PORTACATH PLACEMENT Right 09/08/2015   Procedure: INSERTION OF CENTRAL VENOUS CATHETER WITH PORT FOR CHEMOTHERAPY;  Surgeon: [name], MD;  Location: [facility];  Service: General;  Laterality: Right;  . SHOULDER SURGERY Right    removal of bone spur    Family History  Problem Relation Age of Onset  . Hypertension Mother   . Cancer Mother 58       breast   . Hypertension Father   . Heart attack Father   . Pulmonary embolism Sister   . Parkinson's disease Brother     Current Outpatient Medications on File Prior to Visit  Medication Sig Dispense Refill  . cholecalciferol (VITAMIN D) 1000 units tablet Take 2,000 Units by mouth daily.    . fenofibrate 160 MG tablet Take 160 mg by mouth daily.     . fexofenadine (ALLEGRA) 180 MG tablet Take 180 mg by mouth daily as needed.     . fluticasone (FLONASE) 50 MCG/ACT nasal spray ONE SPRAY TO EACH NOSTRIL DAILY  0  .  furosemide (LASIX) 20 MG tablet Take 1 tablet (20 mg total) by mouth daily. 90 tablet 2  . glucosamine-chondroitin 500-400 MG tablet Take 2 tablets by mouth every morning.    Vanessa Kick Ethyl (VASCEPA) 1 g CAPS Take by mouth.    Marland Kitchen JANUMET XR [phone] MG TB24 Take 1 tablet by mouth 2 (two) times daily.  0  . JARDIANCE 25 MG TABS tablet Take 25 mg by mouth daily.     Marland Kitchen lidocaine-prilocaine (EMLA) cream Apply 1 application topically as needed (prior to accessing port).    . metoprolol tartrate (LOPRESSOR) 25 MG tablet Take 1 tablet (25 mg total) by mouth daily. 30 tablet 0  . Multiple Vitamin (MULTIVITAMIN WITH MINERALS) TABS tablet Take 1 tablet by mouth daily.    . ODEFSEY 200-25-25 MG TABS tablet TAKE ONE TABLET BY MOUTH ONCE DAILY WITH A MEAL. STORE IN ORIGINAL CONTAINER AT ROOM TEMPERATURE. 90 tablet 1  . spironolactone (ALDACTONE) 25 MG tablet TAKE 1 TABLET DAILY 90 tablet 3  . TIVICAY 50 MG tablet TAKE 1 TABLET BY MOUTH ONCE DAILY WITH A MEAL.  STORE AT ROOM TEMPERATURE. 90 tablet 1   No current facility-administered medications on file prior to visit.     Allergies  Allergen Reactions  . Augmentin [Amoxicillin-Pot Clavulanate] Hives and Other (See Comments)    Has patient had a PCN reaction causing immediate  rash, facial/tongue/throat swelling, SOB or lightheadedness with hypotension: No Has patient had a PCN reaction causing severe rash involving mucus membranes or skin necrosis: No Has patient had a PCN reaction that required hospitalization No Has patient had a PCN reaction occurring within the last 10 years: No If all of the above answers are "NO", then may proceed with Cephalosporin use.  . Bleomycin Cough    Social History   Substance and Sexual Activity  Alcohol Use No    Social History   Tobacco Use  Smoking Status Never Smoker  Smokeless Tobacco Never Used    Review of Systems  Constitutional: Negative.   HENT: Negative.   Eyes: Negative.   Respiratory:  Negative.   Cardiovascular: Negative.   Gastrointestinal: Negative.   Genitourinary: Negative.   Musculoskeletal: Negative.   Skin: Negative.   Neurological: Negative.   Endo/Heme/Allergies: Negative.   Psychiatric/Behavioral: Negative.     Objective   Vitals:   01/01/18 1428  BP: (!) 141/74  Pulse: 80  Resp: 18  Temp: (!) 97.3 F (36.3 C)    Physical Exam  Constitutional: She is oriented to person, place, and time. She appears well-developed and well-nourished. No distress.  HENT:  Head: Normocephalic and atraumatic.  Cardiovascular: Normal rate, regular rhythm and normal heart sounds. Exam reveals no gallop and no friction rub.  No murmur heard. Port-A-Cath in place right upper chest  Pulmonary/Chest: Effort normal and breath sounds normal. No stridor. No respiratory distress. She has no wheezes. She has no rales.  Neurological: She is alert and oriented to person, place, and time.  Skin: Skin is warm and dry.  Vitals reviewed. Oncology notes reviewed  Assessment  Hodgkin's lymphoma, finished with chemotherapy Plan   Patient is scheduled for Port-A-Cath removal in the minor procedure room on 01/09/2018.  The risks and benefits of the procedure including bleeding and infection were fully explained to the patient, who gave informed consent.

## 2018-01-09 ENCOUNTER — Ambulatory Visit (HOSPITAL_COMMUNITY)
Admission: RE | Admit: 2018-01-09 | Discharge: 2018-01-09 | Disposition: A | Discharge status: Home / Self Care | Payer: 59 | Source: Ambulatory Visit | Attending: General Surgery | Admitting: General Surgery

## 2018-01-09 ENCOUNTER — Encounter (HOSPITAL_COMMUNITY): Payer: Self-pay | Admitting: *Deleted

## 2018-01-09 ENCOUNTER — Encounter (HOSPITAL_COMMUNITY)
Admission: RE | Disposition: A | Discharge status: Home / Self Care | Payer: Self-pay | Source: Ambulatory Visit | Attending: General Surgery

## 2018-01-09 DIAGNOSIS — C8118 Nodular sclerosis classical Hodgkin lymphoma, lymph nodes of multiple sites: Secondary | ICD-10-CM

## 2018-01-09 DIAGNOSIS — Z86718 Personal history of other venous thrombosis and embolism: Secondary | ICD-10-CM | POA: Insufficient documentation

## 2018-01-09 DIAGNOSIS — Z452 Encounter for adjustment and management of vascular access device: Secondary | ICD-10-CM | POA: Insufficient documentation

## 2018-01-09 DIAGNOSIS — Z7984 Long term (current) use of oral hypoglycemic drugs: Secondary | ICD-10-CM | POA: Diagnosis not present

## 2018-01-09 DIAGNOSIS — Z7951 Long term (current) use of inhaled steroids: Secondary | ICD-10-CM | POA: Insufficient documentation

## 2018-01-09 DIAGNOSIS — Z923 Personal history of irradiation: Secondary | ICD-10-CM | POA: Diagnosis not present

## 2018-01-09 DIAGNOSIS — E119 Type 2 diabetes mellitus without complications: Secondary | ICD-10-CM | POA: Diagnosis not present

## 2018-01-09 DIAGNOSIS — Z79899 Other long term (current) drug therapy: Secondary | ICD-10-CM | POA: Insufficient documentation

## 2018-01-09 DIAGNOSIS — Z9221 Personal history of antineoplastic chemotherapy: Secondary | ICD-10-CM | POA: Diagnosis not present

## 2018-01-09 DIAGNOSIS — Z8571 Personal history of Hodgkin lymphoma: Secondary | ICD-10-CM | POA: Insufficient documentation

## 2018-01-09 DIAGNOSIS — Z21 Asymptomatic human immunodeficiency virus [HIV] infection status: Secondary | ICD-10-CM | POA: Insufficient documentation

## 2018-01-09 HISTORY — PX: PORT-A-CATH REMOVAL: SHX5289

## 2018-01-09 SURGERY — MINOR REMOVAL PORT-A-CATH
Anesthesia: LOCAL

## 2018-01-09 MED ORDER — LIDOCAINE HCL (PF) 1 % IJ SOLN
INTRAMUSCULAR | Status: AC
  Filled 2018-01-09: qty 30

## 2018-01-09 MED ORDER — CHLORHEXIDINE GLUCONATE CLOTH 2 % EX PADS: 6.0000 | MEDICATED_PAD | Freq: Once | CUTANEOUS | Status: DC

## 2018-01-09 SURGICAL SUPPLY — 20 items
CHLORAPREP W/TINT 10.5 ML (MISCELLANEOUS) ×2 IMPLANT
CLOTH BEACON ORANGE TIMEOUT ST (SAFETY) ×2 IMPLANT
DECANTER SPIKE VIAL GLASS SM (MISCELLANEOUS) ×2 IMPLANT
DERMABOND ADVANCED (GAUZE/BANDAGES/DRESSINGS) ×1
DERMABOND ADVANCED .7 DNX12 (GAUZE/BANDAGES/DRESSINGS) ×1 IMPLANT
DRAPE PROXIMA HALF (DRAPES) ×2 IMPLANT
ELECT REM PT RETURN 9FT ADLT (ELECTROSURGICAL) ×2
ELECTRODE REM PT RTRN 9FT ADLT (ELECTROSURGICAL) ×1 IMPLANT
GLOVE BIOGEL PI IND STRL 7.0 (GLOVE) ×1 IMPLANT
GLOVE BIOGEL PI INDICATOR 7.0 (GLOVE) ×1
GLOVE SURG SS PI 7.5 STRL IVOR (GLOVE) ×2 IMPLANT
GOWN STRL REUS W/TWL LRG LVL3 (GOWN DISPOSABLE) ×2 IMPLANT
NEEDLE HYPO 25X1 1.5 SAFETY (NEEDLE) ×2 IMPLANT
PENCIL HANDSWITCHING (ELECTRODE) IMPLANT
SPONGE GAUZE 2X2 8PLY STRL LF (GAUZE/BANDAGES/DRESSINGS) IMPLANT
SUT MNCRL AB 4-0 PS2 18 (SUTURE) IMPLANT
SUT VIC AB 3-0 SH 27 (SUTURE) ×1
SUT VIC AB 3-0 SH 27X BRD (SUTURE) ×1 IMPLANT
SYR CONTROL 10ML LL (SYRINGE) ×2 IMPLANT
TOWEL OR 17X26 4PK STRL BLUE (TOWEL DISPOSABLE) ×2 IMPLANT

## 2018-01-09 NOTE — Op Note (Signed)
Patient:  Holly Stephens  DOB:  Apr 10, 1955  MRN:  [id number]   Preop Diagnosis: Hodgkin's lymphoma, finished with chemotherapy  Postop Diagnosis: Same  Procedure: Port-A-Cath removal  Surgeon: Aviva Signs, MD  Anes: Local  Indications: Patient is a 62 year old white female who has finished with chemotherapy for Hodgkin's lymphoma.  She presents for removal for Port-A-Cath.  The risks and benefits of the procedure were fully explained to the patient, who gave informed consent.  Procedure note: The patient was placed in the supine position.  The right upper chest was prepped and draped using the usual sterile technique with DuraPrep.  Surgical site confirmation was performed.  1% Xylocaine was used for local anesthesia.  Incision was made through the previous surgical incision site.  This was taken down to the port.  The Port-A-Cath was removed in total without difficulty.  Pressure was held for 5 minutes at the right internal jugular entrance site.  Subcutaneous layer was reapproximated using 3-0 Vicryl interrupted suture.  The skin was closed using a 4-0 Monocryl subcuticular suture.  Dermabond was applied.  All tape and needle counts were correct at the end of the procedure.  The patient was discharged from the minor procedure room in good and stable condition.  Complications: None  EBL: Minimal  Specimen: None

## 2018-01-09 NOTE — Discharge Instructions (Signed)
Implanted Port Removal, Care After °Refer to this sheet in the next few weeks. These instructions provide you with information about caring for yourself after your procedure. Your health care provider may also give you more specific instructions. Your treatment has been planned according to current medical practices, but problems sometimes occur. Call your health care provider if you have any problems or questions after your procedure. °What can I expect after the procedure? °After the procedure, it is common to have: °· Soreness or pain near your incision. °· Some swelling or bruising near your incision. ° °Follow these instructions at home: °Medicines °· Take over-the-counter and prescription medicines only as told by your health care provider. °· If you were prescribed an antibiotic medicine, take it as told by your health care provider. Do not stop taking the antibiotic even if you start to feel better. °Bathing °· Do not take baths, swim, or use a hot tub until your health care provider approves. Ask your health care provider if you can take showers. You may only be allowed to take sponge baths for bathing. °Incision care °· Follow instructions from your health care provider about how to take care of your incision. Make sure you: °? Wash your hands with soap and water before you change your bandage (dressing). If soap and water are not available, use hand sanitizer. °? Change your dressing as told by your health care provider. °? Keep your dressing dry. °? Leave stitches (sutures), skin glue, or adhesive strips in place. These skin closures may need to stay in place for 2 weeks or longer. If adhesive strip edges start to loosen and curl up, you may trim the loose edges. Do not remove adhesive strips completely unless your health care provider tells you to do that. °· Check your incision area every day for signs of infection. Check for: °? More redness, swelling, or pain. °? More fluid or  blood. °? Warmth. °? Pus or a bad smell. °Driving °· If you received a sedative, do not drive for 24 hours after the procedure. °· If you did not receive a sedative, ask your health care provider when it is safe to drive. °Activity °· Return to your normal activities as told by your health care provider. Ask your health care provider what activities are safe for you. °· Until your health care provider says it is safe: °? Do not lift anything that is heavier than 10 lb (4.5 kg). °? Do not do activities that involve lifting your arms over your head. °General instructions °· Do not use any tobacco products, such as cigarettes, chewing tobacco, and e-cigarettes. Tobacco can delay healing. If you need help quitting, ask your health care provider. °· Keep all follow-up visits as told by your health care provider. This is important. °Contact a health care provider if: °· You have more redness, swelling, or pain around your incision. °· You have more fluid or blood coming from your incision. °· Your incision feels warm to the touch. °· You have pus or a bad smell coming from your incision. °· You have a fever. °· You have pain that is not relieved by your pain medicine. °Get help right away if: °· You have chest pain. °· You have difficulty breathing. °This information is not intended to replace advice given to you by your health care provider. Make sure you discuss any questions you have with your health care provider. °Document Released: 01/18/2015 Document Revised: 07/15/2015 Document Reviewed: 11/11/2014 °Elsevier Interactive Patient   Education © 2018 [employer] ° °

## 2018-01-09 NOTE — Interval H&P Note (Signed)
History and Physical Interval Note:  01/09/2018 10:05 AM  Holly Stephens  has presented today for surgery, with the diagnosis of hodgkins lymphoma  The various methods of treatment have been discussed with the patient and family. After consideration of risks, benefits and other options for treatment, the patient has consented to  Procedure(s): MINOR REMOVAL PORT-A-CATH (N/A) as a surgical intervention .  The patient's history has been reviewed, patient examined, no change in status, stable for surgery.  I have reviewed the patient's chart and labs.  Questions were answered to the patient's satisfaction.     Aviva Signs

## 2018-01-10 ENCOUNTER — Encounter (HOSPITAL_COMMUNITY): Payer: Self-pay | Admitting: General Surgery

## 2018-01-13 ENCOUNTER — Encounter: Payer: Self-pay | Admitting: Podiatry

## 2018-01-13 NOTE — Progress Notes (Signed)
Subjective: Holly Stephens presents today for follow up with painful, thick toenails 1-5 b/l that she cannot cut herself and which interfere with daily activities.  Pain is aggravated when wearing enclosed shoe gear.  Objective: Vascular Examination: Capillary refill time <3 seconds x 10 digits Dorsalis pedis and Posterior tibial pulses palpable b/l Digital hair sparse x 10 digits Skin temperature gradient WNL b/l  Dermatological Examination: Skin with normal turgor, texture and tone b/l  No open wounds b/l  No interdigital macerations b/l  Toenails 1-5 b/l discolored, thick, dystrophic with subungual debris and pain with palpation to nailbeds due to thickness of nails.  Musculoskeletal: Muscle strength 5/5 to all LE muscle groups  Neurological: Sensation diminished with 10 gram monofilament.   Assessment: Painful onychomycosis toenails 1-5 b/l   Plan: 1. Toenails 1-5 b/l were debrided in length and girth without iatrogenic bleeding. 2. Patient to continue soft, supportive shoe gear 3. Patient to report any pedal injuries to medical professional immediately. 4. Follow up 3 months. Patient/POA to call should there be a concern in the interim.

## 2018-02-14 ENCOUNTER — Other Ambulatory Visit (HOSPITAL_COMMUNITY): Payer: Self-pay | Admitting: Internal Medicine

## 2018-02-14 ENCOUNTER — Other Ambulatory Visit: Payer: Self-pay | Admitting: Internal Medicine

## 2018-02-14 ENCOUNTER — Ambulatory Visit (HOSPITAL_COMMUNITY)
Admission: RE | Admit: 2018-02-14 | Discharge: 2018-02-14 | Disposition: A | Discharge status: Home / Self Care | Payer: 59 | Source: Ambulatory Visit | Attending: Internal Medicine | Admitting: Internal Medicine

## 2018-02-14 DIAGNOSIS — M1712 Unilateral primary osteoarthritis, left knee: Secondary | ICD-10-CM | POA: Diagnosis not present

## 2018-02-14 DIAGNOSIS — R2242 Localized swelling, mass and lump, left lower limb: Secondary | ICD-10-CM | POA: Diagnosis not present

## 2018-02-14 DIAGNOSIS — M25562 Pain in left knee: Secondary | ICD-10-CM

## 2018-02-14 DIAGNOSIS — M25462 Effusion, left knee: Secondary | ICD-10-CM | POA: Insufficient documentation

## 2018-02-14 DIAGNOSIS — M79662 Pain in left lower leg: Secondary | ICD-10-CM | POA: Diagnosis not present

## 2018-02-19 DIAGNOSIS — M25562 Pain in left knee: Secondary | ICD-10-CM | POA: Insufficient documentation

## 2018-02-26 ENCOUNTER — Encounter (HOSPITAL_COMMUNITY): Payer: 59

## 2018-02-27 DIAGNOSIS — M25562 Pain in left knee: Secondary | ICD-10-CM | POA: Diagnosis not present

## 2018-03-09 DIAGNOSIS — L02212 Cutaneous abscess of back [any part, except buttock]: Secondary | ICD-10-CM | POA: Diagnosis not present

## 2018-03-09 DIAGNOSIS — L03312 Cellulitis of back [any part except buttock]: Secondary | ICD-10-CM | POA: Diagnosis not present

## 2018-03-12 ENCOUNTER — Telehealth: Payer: Self-pay | Admitting: *Deleted

## 2018-03-12 DIAGNOSIS — B20 Human immunodeficiency virus [HIV] disease: Secondary | ICD-10-CM

## 2018-03-12 MED ORDER — EMTRICITAB-RILPIVIR-TENOFOV AF 200-25-25 MG PO TABS: 1.0000 | ORAL_TABLET | Freq: Every day | ORAL | 2 refills | Status: DC

## 2018-03-12 MED ORDER — DOLUTEGRAVIR SODIUM 50 MG PO TABS: 50.0000 mg | ORAL_TABLET | Freq: Every day | ORAL | 2 refills | Status: DC

## 2018-03-12 NOTE — Telephone Encounter (Signed)
[facility] sent fax requesting diagnosis code, allergies and new prescription be sent to them.  RN confirmed with patient that she has 2 separate copay cards, 1 each for tivicay and odefsey.  She does, she she has given [facility] the codes. RN called [facility], spoke with pharmacist to relay allergy information, confirmed that the copay information is in her notes, that both [place] should be mailed together. Landis Gandy, RN

## 2018-03-28 ENCOUNTER — Ambulatory Visit: Payer: Self-pay | Admitting: Podiatry

## 2018-03-28 ENCOUNTER — Encounter: Payer: Self-pay | Admitting: Podiatry

## 2018-03-28 DIAGNOSIS — M79675 Pain in left toe(s): Secondary | ICD-10-CM | POA: Diagnosis not present

## 2018-03-28 DIAGNOSIS — M79674 Pain in right toe(s): Secondary | ICD-10-CM

## 2018-03-28 DIAGNOSIS — B351 Tinea unguium: Secondary | ICD-10-CM | POA: Diagnosis not present

## 2018-03-28 NOTE — Patient Instructions (Signed)

## 2018-03-28 NOTE — Progress Notes (Signed)
Subjective: Holly Stephens presents today with painful, thick toenails 1-5 b/l that she cannot cut and which interfere with daily activities.  Pain is aggravated when wearing enclosed shoe gear.  Celene Squibb, MD is her PCP.   Current Outpatient Medications:  .  acetaminophen (TYLENOL) 650 MG CR tablet, Take 1,300 mg by mouth every 8 (eight) hours as needed for pain., Disp: , Rfl:  .  cholecalciferol (VITAMIN D) 1000 units tablet, Take 1,000 Units by mouth daily. , Disp: , Rfl:  .  dolutegravir (TIVICAY) 50 MG tablet, Take 1 tablet (50 mg total) by mouth daily. Take with Odefsey., Disp: 90 tablet, Rfl: 2 .  doxycycline (VIBRAMYCIN) 100 MG capsule, , Disp: , Rfl:  .  emtricitabine-rilpivir-tenofovir AF (ODEFSEY) 200-25-25 MG TABS tablet, Take 1 tablet by mouth daily with breakfast. Take with Tivicay., Disp: 90 tablet, Rfl: 2 .  fenofibrate 160 MG tablet, Take 160 mg by mouth daily. , Disp: , Rfl:  .  fexofenadine (ALLEGRA) 180 MG tablet, Take 180 mg by mouth daily as needed for allergies. , Disp: , Rfl:  .  fluticasone (FLONASE) 50 MCG/ACT nasal spray, Place 1 spray into both nostrils daily as needed for allergies. , Disp: , Rfl: 0 .  furosemide (LASIX) 20 MG tablet, Take 1 tablet (20 mg total) by mouth daily., Disp: 90 tablet, Rfl: 2 .  GLUCOSAMINE-CHONDROITIN PO, Take 3 tablets by mouth daily. , Disp: , Rfl:  .  Icosapent Ethyl (VASCEPA) 1 g CAPS, Take 1 g by mouth 2 (two) times daily. , Disp: , Rfl:  .  JANUMET XR [phone] MG TB24, Take 1 tablet by mouth 2 (two) times daily., Disp: , Rfl: 0 .  JARDIANCE 25 MG TABS tablet, Take 25 mg by mouth daily. , Disp: , Rfl:  .  lidocaine-prilocaine (EMLA) cream, Apply 1 application topically daily as needed (prior to accessing port). , Disp: , Rfl:  .  methocarbamol (ROBAXIN) 750 MG tablet, , Disp: , Rfl:  .  metoprolol tartrate (LOPRESSOR) 25 MG tablet, Take 1 tablet (25 mg total) by mouth daily., Disp: 30 tablet, Rfl: 0 .  Multiple Vitamin  (MULTIVITAMIN WITH MINERALS) TABS tablet, Take 1 tablet by mouth daily., Disp: , Rfl:  .  spironolactone (ALDACTONE) 25 MG tablet, TAKE 1 TABLET DAILY (Patient taking differently: Take 25 mg by mouth daily. ), Disp: 90 tablet, Rfl: 3 .  vitamin E 400 UNIT capsule, Take 400 Units by mouth daily., Disp: , Rfl:   Allergies  Allergen Reactions  . Augmentin [Amoxicillin-Pot Clavulanate] Hives and Other (See Comments)    Has patient had a PCN reaction causing immediate rash, facial/tongue/throat swelling, SOB or lightheadedness with hypotension: No Has patient had a PCN reaction causing severe rash involving mucus membranes or skin necrosis: No Has patient had a PCN reaction that required hospitalization No Has patient had a PCN reaction occurring within the last 10 years: No If all of the above answers are "NO", then may proceed with Cephalosporin use.  . Bleomycin Cough    Objective:  Vascular Examination: Capillary refill time <3 seconds x 10 digits  Dorsalis pedis and Posterior tibial pulses palpable b/l  Digital hair sparse x 10 digits  Skin temperature gradient WNL b/l  Dermatological Examination: Skin with normal turgor, texture and tone b/l  No open wounds b/l  No interdigital macerations b/l  Toenails 1-5 b/l discolored, thick, dystrophic with subungual debris and pain with palpation to nailbeds due to thickness of nails.  Musculoskeletal:  Muscle strength 5/5 to all LE muscle groups  No gross bony deformities b/l.  No pain, crepitus or joint limitation noted with ROM.   Neurological: Sensation intact with 10 gram monofilament. Vibratory sensation intact.  Assessment: Painful onychomycosis toenails 1-5 b/l   Plan: 1. Toenails 1-5 b/l were debrided in length and girth without iatrogenic bleeding. 2. Patient to continue soft, supportive shoe gear 3. Patient to report any pedal injuries to medical professional immediately. 4. Follow up 3 months. Patient/POA to  call should there be a concern in the interim.

## 2018-03-30 DIAGNOSIS — Z6841 Body Mass Index (BMI) 40.0 and over, adult: Secondary | ICD-10-CM | POA: Diagnosis not present

## 2018-03-30 DIAGNOSIS — M545 Low back pain: Secondary | ICD-10-CM | POA: Diagnosis not present

## 2018-03-30 DIAGNOSIS — S39012A Strain of muscle, fascia and tendon of lower back, initial encounter: Secondary | ICD-10-CM | POA: Diagnosis not present

## 2018-04-05 DIAGNOSIS — E782 Mixed hyperlipidemia: Secondary | ICD-10-CM | POA: Diagnosis not present

## 2018-04-05 DIAGNOSIS — E1169 Type 2 diabetes mellitus with other specified complication: Secondary | ICD-10-CM | POA: Diagnosis not present

## 2018-04-05 DIAGNOSIS — I10 Essential (primary) hypertension: Secondary | ICD-10-CM | POA: Diagnosis not present

## 2018-04-05 DIAGNOSIS — E119 Type 2 diabetes mellitus without complications: Secondary | ICD-10-CM | POA: Diagnosis not present

## 2018-04-12 DIAGNOSIS — R945 Abnormal results of liver function studies: Secondary | ICD-10-CM | POA: Diagnosis not present

## 2018-04-12 DIAGNOSIS — E1169 Type 2 diabetes mellitus with other specified complication: Secondary | ICD-10-CM | POA: Diagnosis not present

## 2018-04-12 DIAGNOSIS — E782 Mixed hyperlipidemia: Secondary | ICD-10-CM | POA: Diagnosis not present

## 2018-04-12 DIAGNOSIS — I10 Essential (primary) hypertension: Secondary | ICD-10-CM | POA: Diagnosis not present

## 2018-04-30 DIAGNOSIS — J019 Acute sinusitis, unspecified: Secondary | ICD-10-CM | POA: Diagnosis not present

## 2018-05-05 DIAGNOSIS — Z1211 Encounter for screening for malignant neoplasm of colon: Secondary | ICD-10-CM | POA: Diagnosis not present

## 2018-05-05 DIAGNOSIS — Z1212 Encounter for screening for malignant neoplasm of rectum: Secondary | ICD-10-CM | POA: Diagnosis not present

## 2018-06-13 ENCOUNTER — Other Ambulatory Visit (HOSPITAL_COMMUNITY): Payer: Self-pay | Admitting: *Deleted

## 2018-06-13 DIAGNOSIS — C8118 Nodular sclerosis classical Hodgkin lymphoma, lymph nodes of multiple sites: Secondary | ICD-10-CM

## 2018-06-13 DIAGNOSIS — C8114 Nodular sclerosis classical Hodgkin lymphoma, lymph nodes of axilla and upper limb: Secondary | ICD-10-CM

## 2018-06-14 ENCOUNTER — Other Ambulatory Visit: Payer: Self-pay

## 2018-06-14 ENCOUNTER — Inpatient Hospital Stay (HOSPITAL_COMMUNITY): Payer: BLUE CROSS/BLUE SHIELD | Attending: Hematology

## 2018-06-14 ENCOUNTER — Ambulatory Visit (HOSPITAL_COMMUNITY): Payer: 59

## 2018-06-14 DIAGNOSIS — C8114 Nodular sclerosis classical Hodgkin lymphoma, lymph nodes of axilla and upper limb: Secondary | ICD-10-CM

## 2018-06-14 DIAGNOSIS — C8118 Nodular sclerosis classical Hodgkin lymphoma, lymph nodes of multiple sites: Secondary | ICD-10-CM

## 2018-06-14 LAB — COMPREHENSIVE METABOLIC PANEL
ALT: 63 U/L — ABNORMAL HIGH (ref 0–44)
AST: 54 U/L — ABNORMAL HIGH (ref 15–41)
Albumin: 4.5 g/dL (ref 3.5–5.0)
Alkaline Phosphatase: 38 U/L (ref 38–126)
Anion gap: 13 (ref 5–15)
BUN: 15 mg/dL (ref 8–23)
CO2: 26 mmol/L (ref 22–32)
Calcium: 10.1 mg/dL (ref 8.9–10.3)
Chloride: 101 mmol/L (ref 98–111)
Creatinine, Ser: 0.67 mg/dL (ref 0.44–1.00)
GFR calc Af Amer: >60 mL/min (ref >60)
GFR calc non Af Amer: >60 mL/min (ref >60)
Glucose, Bld: 158 mg/dL — ABNORMAL HIGH (ref 70–99)
Potassium: 4.2 mmol/L (ref 3.5–5.1)
Sodium: 140 mmol/L (ref 135–145)
Total Bilirubin: 0.4 mg/dL (ref 0.3–1.2)
Total Protein: 7.8 g/dL (ref 6.5–8.1)

## 2018-06-14 LAB — CBC WITH DIFFERENTIAL/PLATELET
Abs Immature Granulocytes: 0.05 10*3/uL (ref 0.00–0.07)
Basophils Absolute: 0.1 10*3/uL (ref 0.0–0.1)
Basophils Relative: 1 %
Eosinophils Absolute: 0.1 10*3/uL (ref 0.0–0.5)
Eosinophils Relative: 1 %
HCT: 51.5 % — ABNORMAL HIGH (ref 36.0–46.0)
Hemoglobin: 16.5 g/dL — ABNORMAL HIGH (ref 12.0–15.0)
Immature Granulocytes: 1 %
Lymphocytes Relative: 34 %
Lymphs Abs: 2.9 10*3/uL (ref 0.7–4.0)
MCH: 30.3 pg (ref 26.0–34.0)
MCHC: 32 g/dL (ref 30.0–36.0)
MCV: 94.7 fL (ref 80.0–100.0)
Monocytes Absolute: 0.6 10*3/uL (ref 0.1–1.0)
Monocytes Relative: 7 %
Neutro Abs: 4.7 10*3/uL (ref 1.7–7.7)
Neutrophils Relative %: 56 %
Platelets: 265 10*3/uL (ref 150–400)
RBC: 5.44 MIL/uL — ABNORMAL HIGH (ref 3.87–5.11)
RDW: 13.8 % (ref 11.5–15.5)
WBC: 8.4 10*3/uL (ref 4.0–10.5)
nRBC: 0 % (ref 0.0–0.2)

## 2018-06-14 LAB — SEDIMENTATION RATE: Sed Rate: 1 mm/hr (ref 0–22)

## 2018-06-14 LAB — LACTATE DEHYDROGENASE: LDH: 169 U/L (ref 98–192)

## 2018-06-18 ENCOUNTER — Ambulatory Visit (HOSPITAL_COMMUNITY)
Admission: RE | Admit: 2018-06-18 | Discharge: 2018-06-18 | Disposition: A | Discharge status: Home / Self Care | Payer: BLUE CROSS/BLUE SHIELD | Source: Ambulatory Visit | Attending: Internal Medicine | Admitting: Internal Medicine

## 2018-06-18 ENCOUNTER — Other Ambulatory Visit: Payer: Self-pay

## 2018-06-18 DIAGNOSIS — C8118 Nodular sclerosis classical Hodgkin lymphoma, lymph nodes of multiple sites: Secondary | ICD-10-CM | POA: Insufficient documentation

## 2018-06-18 DIAGNOSIS — C8190 Hodgkin lymphoma, unspecified, unspecified site: Secondary | ICD-10-CM | POA: Diagnosis not present

## 2018-06-18 MED ORDER — IOHEXOL 300 MG/ML  SOLN
100.0000 mL | Freq: Once | INTRAMUSCULAR | Status: AC | PRN
  Administered 2018-06-18: 100 mL via INTRAVENOUS

## 2018-06-21 ENCOUNTER — Ambulatory Visit (HOSPITAL_COMMUNITY): Payer: 59 | Admitting: Hematology

## 2018-06-26 ENCOUNTER — Encounter (HOSPITAL_COMMUNITY): Payer: Self-pay | Admitting: Hematology

## 2018-06-26 ENCOUNTER — Inpatient Hospital Stay (HOSPITAL_COMMUNITY): Payer: BLUE CROSS/BLUE SHIELD | Attending: Hematology | Admitting: Hematology

## 2018-06-26 ENCOUNTER — Other Ambulatory Visit: Payer: Self-pay

## 2018-06-26 VITALS — BP 138/66 | HR 92 | Temp 98.2°F | Resp 18 | Wt 274.2 lb

## 2018-06-26 DIAGNOSIS — Z79899 Other long term (current) drug therapy: Secondary | ICD-10-CM | POA: Insufficient documentation

## 2018-06-26 DIAGNOSIS — C8118 Nodular sclerosis classical Hodgkin lymphoma, lymph nodes of multiple sites: Secondary | ICD-10-CM | POA: Diagnosis not present

## 2018-06-26 DIAGNOSIS — Z9221 Personal history of antineoplastic chemotherapy: Secondary | ICD-10-CM

## 2018-06-26 DIAGNOSIS — Z923 Personal history of irradiation: Secondary | ICD-10-CM | POA: Diagnosis not present

## 2018-06-26 DIAGNOSIS — I110 Hypertensive heart disease with heart failure: Secondary | ICD-10-CM | POA: Insufficient documentation

## 2018-06-26 DIAGNOSIS — B20 Human immunodeficiency virus [HIV] disease: Secondary | ICD-10-CM | POA: Insufficient documentation

## 2018-06-26 DIAGNOSIS — R200 Anesthesia of skin: Secondary | ICD-10-CM | POA: Diagnosis not present

## 2018-06-26 DIAGNOSIS — C8114 Nodular sclerosis classical Hodgkin lymphoma, lymph nodes of axilla and upper limb: Secondary | ICD-10-CM

## 2018-06-26 NOTE — Progress Notes (Signed)
Holly Stephens, [place] [id number]   CLINIC:  Medical Oncology/Hematology  PCP:  Celene Squibb, MD [place] [address] [phone]   REASON FOR VISIT:  Follow-up for [name], nodular sclerosis    BRIEF ONCOLOGIC HISTORY:    Hodgkin's disease, nodular sclerosis, of lymph nodes of multiple sites ([place])   07/23/2015 Procedure    Lext axilla lymph node biopsy    07/27/2015 Pathology Results    Classical [name], nodular sclerosis type.  Positive CD30, CD15, andPAX-5.  Negative CD45 and CD20.    08/31/2015 PET scan    Hypermetabolic lymphadenopathy. Nodal stations include: L supraclavicular nodes, L axillary nodes, mediastinal lymph nodes, periaortic retroperitoneal and periportal lymph nodes as well as L iliac and inguinal lymph nodes. Two concerning pulm nodules...    09/01/2015 Imaging    MUGA- Normal LEFT ventricular ejection fraction of 73%.  Normal LEFT ventricular wall motion.    09/01/2015 Procedure    PFTs-  Spirometry shows mild ventilatory defect without airflow obstruction and no bronchodilator improvement. Lung volumes are normal. Airway resistance is normal. DLCO normal. Essentially normal lung function    09/08/2015 Procedure    Port placed by Dr. Rosana Hoes    09/13/2015 - 02/23/2016 Chemotherapy    ABVD    09/27/2015 Imaging    CT angio of chest- no gross PE seen. Improving axillary, mediastinal and upper abdominal adenopathy. Improved and possibly resolved RUL nodule and with resolved or obscured LLL nodule. Small L pleural effusion and mild L lower lobe atelectasis.    10/11/2015 Treatment Plan Change    Bleomycin discontinued     11/01/2015 PET scan    Clear interval response to therapy with decrease in size and hypermetabolism of larger lymph nodes seen previously and some of the smaller hypermetabolic lymph node seen on the previous study have resolved completely in terms of abnormal enlargement and  hypermetabolism. 2. Slight decrease in left lower lobe pulmonary nodule with interval resolution of the right upper lobe pulmonary nodule. 3. Stable appearance of mild heterogeneous FDG accumulation within the marrow space.    11/01/2015 Miscellaneous    Deauville 3- 4 more cycles of AVD recommended according to NCCN guidelines.    02/09/2016 Imaging    [place] left leg- Positive for acute DVT in left calf, popliteal and femoral veins.    03/22/2016 PET scan    1. Mild residual metabolic activity within LEFT axillary lymph nodes ( Deauville 2 ). 2. No additional hypermetabolic adenopathy identified. 3. Normal spleen and marrow activity.    04/25/2016 - 05/23/2016 Radiation Therapy    Residual disease treated with radiation Isidore Moos): Left axilla treated with 36 Gy in 20 fractions.    07/24/2016 PET scan    PET IMPRESSION: 1. Stable exam. The mild residual low level metabolic activity within left axillary lymph nodes a similar to prior (Deauville 2). 2. No new or progressive findings. 3. Abdominal aortic atherosclerosis.     11/23/2016 Imaging    CT C/A/P: 1. Essentially stable left axillary adenopathy. No new adenopathy identified. 2.  Aortic Atherosclerosis (ICD10-I70.0). 3. Small left adrenal adenoma. 4. Midline ventral hernia contains about 630 cc of omental adipose tissue.      CANCER STAGING: Cancer Staging Hodgkin's disease, nodular sclerosis, of lymph nodes of multiple sites [facility]) Staging form: Lymphoid Neoplasms, AJCC 6th Edition - Clinical stage from 08/31/2015: Stage III - Signed by Baird Cancer, PA-C on 09/01/2015  INTERVAL HISTORY:  Holly Stephens 63 y.o. female returns for routine follow-up. She is here today alone. She states that she still has numbness in her toes that has not changed. She states that she has felt well since her last visit. Denies any nausea, vomiting, or diarrhea. Denies any new pains. Had not noticed any recent bleeding such as epistaxis,  hematuria or hematochezia. Denies recent chest pain on exertion, shortness of breath on minimal exertion, pre-syncopal episodes, or palpitations. Denies any numbness or tingling in hands. Denies any recent fevers, infections, or recent hospitalizations. Patient reports appetite at 100% and energy level at 75%.     REVIEW OF SYSTEMS:  Review of Systems  Neurological: Positive for numbness.     PAST MEDICAL/SURGICAL HISTORY:  Past Medical History:  Diagnosis Date   Acute deep vein thrombosis (DVT) of popliteal vein of left lower extremity ([place]) 02/09/2016   Arthritis    osteoarthritis   Diabetes mellitus without complication ([place])    History of radiation therapy 04/25/2016 - 05/23/2016   Left Axilla treated to 36 Gy in 20 fractions   HIV infection ([place])    [name], nodular sclerosis ([place]) 08/23/2015   Human immunodeficiency virus (HIV) disease ([place]) 02/23/2006   Qualifier: Diagnosis of  By: Megan Salon MD, John     Past Surgical History:  Procedure Laterality Date   AXILLARY LYMPH NODE BIOPSY Left 07/23/2015   Procedure: EXCISIONAL BIOPSY LEFT AXILLARY LYMPH NODE;  Surgeon: [name], MD;  Location: [facility];  Service: General;  Laterality: Left;   PORT-A-CATH REMOVAL N/A 01/09/2018   Procedure: MINOR REMOVAL PORT-A-CATH;  Surgeon: Aviva Signs, MD;  Location: [facility];  Service: General;  Laterality: N/A;   PORTACATH PLACEMENT Right 09/08/2015   Procedure: INSERTION OF CENTRAL VENOUS CATHETER WITH PORT FOR CHEMOTHERAPY;  Surgeon: [name], MD;  Location: [facility];  Service: General;  Laterality: Right;   SHOULDER SURGERY Right    removal of bone spur     SOCIAL HISTORY:  Social History   Socioeconomic History   Marital status: Widowed    Spouse name: Not on file   Number of children: Not on file   Years of education: Not on file   Highest education level: Not on file  Occupational History   Not on file  Social Needs   Financial resource  strain: Not on file   Food insecurity:    Worry: Not on file    Inability: Not on file   Transportation needs:    Medical: Not on file    Non-medical: Not on file  Tobacco Use   Smoking status: Never Smoker   Smokeless tobacco: Never Used  Substance and Sexual Activity   Alcohol use: No   Drug use: No   Sexual activity: Never    Comment: declined condoms  Lifestyle   Physical activity:    Days per week: Not on file    Minutes per session: Not on file   Stress: Not on file  Relationships   Social connections:    Talks on phone: Not on file    Gets together: Not on file    Attends religious service: Not on file    Active member of club or organization: Not on file    Attends meetings of clubs or organizations: Not on file    Relationship status: Not on file   Intimate partner violence:    Fear of current or ex partner: Not on file    Emotionally abused:  Not on file    Physically abused: Not on file    Forced sexual activity: Not on file  Other Topics Concern   Not on file  Social History Narrative   Not on file    FAMILY HISTORY:  Family History  Problem Relation Age of Onset   Hypertension Mother    Cancer Mother 1       breast    Hypertension Father    Heart attack Father    Pulmonary embolism Sister    Parkinson's disease Brother     CURRENT MEDICATIONS:  Outpatient Encounter Medications as of 06/26/2018  Medication Sig   acetaminophen (TYLENOL) 650 MG CR tablet Take 1,300 mg by mouth every 8 (eight) hours as needed for pain.   cholecalciferol (VITAMIN D) 1000 units tablet Take 1,000 Units by mouth daily.    dolutegravir (TIVICAY) 50 MG tablet Take 1 tablet (50 mg total) by mouth daily. Take with Odefsey.   emtricitabine-rilpivir-tenofovir AF (ODEFSEY) 200-25-25 MG TABS tablet Take 1 tablet by mouth daily with breakfast. Take with Tivicay.   fenofibrate 160 MG tablet Take 160 mg by mouth daily.    fexofenadine (ALLEGRA) 180 MG tablet  Take 180 mg by mouth daily as needed for allergies.    fluticasone (FLONASE) 50 MCG/ACT nasal spray Place 1 spray into both nostrils daily as needed for allergies.    furosemide (LASIX) 20 MG tablet Take 1 tablet (20 mg total) by mouth daily.   GLUCOSAMINE-CHONDROITIN PO Take 3 tablets by mouth daily.    JANUMET XR [phone] MG TB24 Take 1 tablet by mouth 2 (two) times daily.   JARDIANCE 25 MG TABS tablet Take 25 mg by mouth daily.    methocarbamol (ROBAXIN) 750 MG tablet    metoprolol tartrate (LOPRESSOR) 25 MG tablet Take 1 tablet (25 mg total) by mouth daily.   Multiple Vitamin (MULTIVITAMIN WITH MINERALS) TABS tablet Take 1 tablet by mouth daily.   PROAIR HFA 108 (90 Base) MCG/ACT inhaler INL 2 PFS PO Q 4 TO 6 H PRF WHZ   REPATHA 140 MG/ML SOSY INJECT 1 SYRINGE INTO THE SKIN Q 2 WEEKS   spironolactone (ALDACTONE) 25 MG tablet TAKE 1 TABLET DAILY (Patient taking differently: Take 25 mg by mouth daily. )   vitamin E 400 UNIT capsule Take 400 Units by mouth daily.   doxycycline (VIBRAMYCIN) 100 MG capsule    Icosapent Ethyl (VASCEPA) 1 g CAPS Take 1 g by mouth 2 (two) times daily.    lidocaine-prilocaine (EMLA) cream Apply 1 application topically daily as needed (prior to accessing port).    No facility-administered encounter medications on file as of 06/26/2018.     ALLERGIES:  Allergies  Allergen Reactions   Augmentin [Amoxicillin-Pot Clavulanate] Hives and Other (See Comments)    Has patient had a PCN reaction causing immediate rash, facial/tongue/throat swelling, SOB or lightheadedness with hypotension: No Has patient had a PCN reaction causing severe rash involving mucus membranes or skin necrosis: No Has patient had a PCN reaction that required hospitalization No Has patient had a PCN reaction occurring within the last 10 years: No If all of the above answers are "NO", then may proceed with Cephalosporin use.   Bleomycin Cough     PHYSICAL EXAM:  ECOG  Performance status: 1  Vitals:   06/26/18 0820  BP: 138/66  Pulse: 92  Resp: 18  Temp: 98.2 F (36.8 C)  SpO2: 99%   Filed Weights   06/26/18 0820  Weight: 274 lb  3.2 oz (124.4 kg)    Physical Exam Vitals signs reviewed.  Constitutional:      Appearance: Normal appearance.  Cardiovascular:     Rate and Rhythm: Normal rate and regular rhythm.     Heart sounds: Normal heart sounds.  Pulmonary:     Effort: Pulmonary effort is normal.     Breath sounds: Normal breath sounds.  Abdominal:     General: There is distension.     Palpations: Abdomen is soft.  Musculoskeletal:        General: No swelling.  Skin:    General: Skin is warm.  Neurological:     General: No focal deficit present.     Mental Status: She is alert and oriented to person, place, and time.  Psychiatric:        Mood and Affect: Mood normal.        Behavior: Behavior normal.      LABORATORY DATA:  I have reviewed the labs as listed.  CBC    Component Value Date/Time   WBC 8.4 06/14/2018 0935   RBC 5.44 (H) 06/14/2018 0935   HGB 16.5 (H) 06/14/2018 0935   HCT 51.5 (H) 06/14/2018 0935   PLT 265 06/14/2018 0935   MCV 94.7 06/14/2018 0935   MCH 30.3 06/14/2018 0935   MCHC 32.0 06/14/2018 0935   RDW 13.8 06/14/2018 0935   LYMPHSABS 2.9 06/14/2018 0935   MONOABS 0.6 06/14/2018 0935   EOSABS 0.1 06/14/2018 0935   BASOSABS 0.1 06/14/2018 0935   CMP Latest Ref Rng & Units 06/14/2018 11/23/2017 09/06/2017  Glucose 70 - 99 mg/dL 158(H) 115(H) 154(H)  BUN 8 - 23 mg/dL 15 19 14   Creatinine 0.44 - 1.00 mg/dL 0.67 0.70 0.70  Sodium 135 - 145 mmol/L 140 142 139  Potassium 3.5 - 5.1 mmol/L 4.2 4.1 4.4  Chloride 98 - 111 mmol/L 101 104 101  CO2 22 - 32 mmol/L 26 25 28   Calcium 8.9 - 10.3 mg/dL 10.1 9.9 10.6(H)  Total Protein 6.5 - 8.1 g/dL 7.8 7.6 7.7  Total Bilirubin 0.3 - 1.2 mg/dL 0.4 0.7 0.6  Alkaline Phos 38 - 126 U/L 38 33(L) -  AST 15 - 41 U/L 54(H) 41 55(H)  ALT 0 - 44 U/L 63(H) 47(H) 65(H)        DIAGNOSTIC IMAGING:  I have independently reviewed the scans and discussed with the patient.   I have reviewed Holly Lick LPN's note and agree with the documentation.  I personally performed a face-to-face visit, made revisions and my assessment and plan is as follows.    ASSESSMENT & PLAN:   Hodgkin's disease, nodular sclerosis, of lymph nodes of multiple sites ([place]) 1.  Hodgkin's Lymphoma, nodular-sclerosing type involving multiple lymph nodes:  - Left axilla pathology:CD30, CD15, & PAX-5 positive  - ABVD chemotherapy beginning on 09/13/15, with discontinuation of Bleomycin on 10/11/15.  - Mid-course PET scan revealed Deauville 3 lymphadenopathy, so she received 4 additional cycles of AVD.   - Treatment course was complicated by diastolic heart failure  - Chemotherapy completed on 02/23/16. - Post-treatment PET revealed mild residual (L) axillary adenopathy (Deauville 2), so she was referred for axillary radiation therapy.  - Completed XRT to left axilla on 05/23/16.  - Repeat PET 6/18: Mild residual low level metabolic activity within left axillary lymph nodes a similar to prior (Deauville 2). - She has continued with clinical surveillance with scans q 30mths.  - Most recent CT CAP 06/18/2018 reveals stable bilateral axilla lymph  nodes.  -I will see her back in 6 months for follow-up with labs and physical exam.  I plan to repeat CT scan in 1 year.  2. HIV: - Continue follow up with Infectious Disease  3. Chemo induced heart failure - Continue follow up with Cardiology.      Orders placed this encounter:  Orders Placed This Encounter  Procedures   CBC with Differential/Platelet   Comprehensive metabolic panel   Lactate dehydrogenase   Sedimentation rate      Holly Jack, MD [place] [phone]

## 2018-06-26 NOTE — Assessment & Plan Note (Addendum)
1.  Hodgkin's Lymphoma, nodular-sclerosing type involving multiple lymph nodes:  - Left axilla pathology:CD30, CD15, & PAX-5 positive  - ABVD chemotherapy beginning on 09/13/15, with discontinuation of Bleomycin on 10/11/15.  - Mid-course PET scan revealed Deauville 3 lymphadenopathy, so she received 4 additional cycles of AVD.   - Treatment course was complicated by diastolic heart failure  - Chemotherapy completed on 02/23/16. - Post-treatment PET revealed mild residual (L) axillary adenopathy (Deauville 2), so she was referred for axillary radiation therapy.  - Completed XRT to left axilla on 05/23/16.  - Repeat PET 6/18: Mild residual low level metabolic activity within left axillary lymph nodes a similar to prior (Deauville 2). - She has continued with clinical surveillance with scans q 29mths.  - Most recent CT CAP 06/18/2018 reveals stable bilateral axilla lymph nodes.  -I will see her back in 6 months for follow-up with labs and physical exam.  I plan to repeat CT scan in 1 year.  2. HIV: - Continue follow up with Infectious Disease  3. Chemo induced heart failure - Continue follow up with Cardiology.

## 2018-06-27 ENCOUNTER — Ambulatory Visit: Payer: BLUE CROSS/BLUE SHIELD | Admitting: Podiatry

## 2018-06-27 ENCOUNTER — Ambulatory Visit (HOSPITAL_COMMUNITY): Payer: BLUE CROSS/BLUE SHIELD

## 2018-07-01 ENCOUNTER — Ambulatory Visit: Payer: BLUE CROSS/BLUE SHIELD | Admitting: Podiatry

## 2018-07-01 ENCOUNTER — Other Ambulatory Visit: Payer: Self-pay

## 2018-07-01 ENCOUNTER — Encounter: Payer: Self-pay | Admitting: Podiatry

## 2018-07-01 VITALS — Temp 97.5°F

## 2018-07-01 DIAGNOSIS — B351 Tinea unguium: Secondary | ICD-10-CM | POA: Diagnosis not present

## 2018-07-01 DIAGNOSIS — M79674 Pain in right toe(s): Secondary | ICD-10-CM | POA: Diagnosis not present

## 2018-07-01 DIAGNOSIS — E119 Type 2 diabetes mellitus without complications: Secondary | ICD-10-CM | POA: Diagnosis not present

## 2018-07-01 DIAGNOSIS — M79675 Pain in left toe(s): Secondary | ICD-10-CM

## 2018-07-01 NOTE — Patient Instructions (Addendum)
Diabetes Mellitus and Foot Care  Foot care is an important part of your health, especially when you have diabetes. Diabetes may cause you to have problems because of poor blood flow (circulation) to your feet and legs, which can cause your skin to:   Become thinner and drier.   Break more easily.   Heal more slowly.   Peel and crack.  You may also have nerve damage (neuropathy) in your legs and feet, causing decreased feeling in them. This means that you may not notice minor injuries to your feet that could lead to more serious problems. Noticing and addressing any potential problems early is the best way to prevent future foot problems.  How to care for your feet  Foot hygiene   Wash your feet daily with warm water and mild soap. Do not use hot water. Then, pat your feet and the areas between your toes until they are completely dry. Do not soak your feet as this can dry your skin.   Trim your toenails straight across. Do not dig under them or around the cuticle. File the edges of your nails with an emery board or nail file.   Apply a moisturizing lotion or petroleum jelly to the skin on your feet and to dry, brittle toenails. Use lotion that does not contain alcohol and is unscented. Do not apply lotion between your toes.  Shoes and socks   Wear clean socks or stockings every day. Make sure they are not too tight. Do not wear knee-high stockings since they may decrease blood flow to your legs.   Wear shoes that fit properly and have enough cushioning. Always look in your shoes before you put them on to be sure there are no objects inside.   To break in new shoes, wear them for just a few hours a day. This prevents injuries on your feet.  Wounds, scrapes, corns, and calluses   Check your feet daily for blisters, cuts, bruises, sores, and redness. If you cannot see the bottom of your feet, use a mirror or ask someone for help.   Do not cut corns or calluses or try to remove them with medicine.   If you  find a minor scrape, cut, or break in the skin on your feet, keep it and the skin around it clean and dry. You may clean these areas with mild soap and water. Do not clean the area with peroxide, alcohol, or iodine.   If you have a wound, scrape, corn, or callus on your foot, look at it several times a day to make sure it is healing and not infected. Check for:  ? Redness, swelling, or pain.  ? Fluid or blood.  ? Warmth.  ? Pus or a bad smell.  General instructions   Do not cross your legs. This may decrease blood flow to your feet.   Do not use heating pads or hot water bottles on your feet. They may burn your skin. If you have lost feeling in your feet or legs, you may not know this is happening until it is too late.   Protect your feet from hot and cold by wearing shoes, such as at the beach or on hot pavement.   Schedule a complete foot exam at least once a year (annually) or more often if you have foot problems. If you have foot problems, report any cuts, sores, or bruises to your health care provider immediately.  Contact a health care provider if:     You have a medical condition that increases your risk of infection and you have any cuts, sores, or bruises on your feet.   You have an injury that is not healing.   You have redness on your legs or feet.   You feel burning or tingling in your legs or feet.   You have pain or cramps in your legs and feet.   Your legs or feet are numb.   Your feet always feel cold.   You have pain around a toenail.  Get help right away if:   You have a wound, scrape, corn, or callus on your foot and:  ? You have pain, swelling, or redness that gets worse.  ? You have fluid or blood coming from the wound, scrape, corn, or callus.  ? Your wound, scrape, corn, or callus feels warm to the touch.  ? You have pus or a bad smell coming from the wound, scrape, corn, or callus.  ? You have a fever.  ? You have a red line going up your leg.  Summary   Check your feet every day  for cuts, sores, red spots, swelling, and blisters.   Moisturize feet and legs daily.   Wear shoes that fit properly and have enough cushioning.   If you have foot problems, report any cuts, sores, or bruises to your health care provider immediately.   Schedule a complete foot exam at least once a year (annually) or more often if you have foot problems.  This information is not intended to replace advice given to you by your health care provider. Make sure you discuss any questions you have with your health care provider.  Document Released: 02/04/2000 Document Revised: 03/21/2017 Document Reviewed: 03/10/2016  Elsevier Interactive Patient Education  2019 [employer]

## 2018-07-01 NOTE — Progress Notes (Signed)
Subjective: Holly Stephens presents today with painful, thick toenails 1-5 b/l that she cannot cut and which interfere with daily activities.  Pain is aggravated when wearing enclosed shoe gear.  Celene Squibb, MD is her PCP. Last visit was 4 months ago.   Current Outpatient Medications:  .  acetaminophen (TYLENOL) 650 MG CR tablet, Take 1,300 mg by mouth every 8 (eight) hours as needed for pain., Disp: , Rfl:  .  cholecalciferol (VITAMIN D) 1000 units tablet, Take 1,000 Units by mouth daily. , Disp: , Rfl:  .  dolutegravir (TIVICAY) 50 MG tablet, Take 1 tablet (50 mg total) by mouth daily. Take with Odefsey., Disp: 90 tablet, Rfl: 2 .  doxycycline (VIBRAMYCIN) 100 MG capsule, , Disp: , Rfl:  .  emtricitabine-rilpivir-tenofovir AF (ODEFSEY) 200-25-25 MG TABS tablet, Take 1 tablet by mouth daily with breakfast. Take with Tivicay., Disp: 90 tablet, Rfl: 2 .  fenofibrate 160 MG tablet, Take 160 mg by mouth daily. , Disp: , Rfl:  .  fexofenadine (ALLEGRA) 180 MG tablet, Take 180 mg by mouth daily as needed for allergies. , Disp: , Rfl:  .  fluticasone (FLONASE) 50 MCG/ACT nasal spray, Place 1 spray into both nostrils daily as needed for allergies. , Disp: , Rfl: 0 .  furosemide (LASIX) 20 MG tablet, Take 1 tablet (20 mg total) by mouth daily., Disp: 90 tablet, Rfl: 2 .  GLUCOSAMINE-CHONDROITIN PO, Take 3 tablets by mouth daily. , Disp: , Rfl:  .  Icosapent Ethyl (VASCEPA) 1 g CAPS, Take 1 g by mouth 2 (two) times daily. , Disp: , Rfl:  .  JANUMET XR [phone] MG TB24, Take 1 tablet by mouth 2 (two) times daily., Disp: , Rfl: 0 .  JARDIANCE 25 MG TABS tablet, Take 25 mg by mouth daily. , Disp: , Rfl:  .  lidocaine-prilocaine (EMLA) cream, Apply 1 application topically daily as needed (prior to accessing port). , Disp: , Rfl:  .  methocarbamol (ROBAXIN) 750 MG tablet, , Disp: , Rfl:  .  metoprolol tartrate (LOPRESSOR) 25 MG tablet, Take 1 tablet (25 mg total) by mouth daily., Disp: 30 tablet, Rfl:  0 .  Multiple Vitamin (MULTIVITAMIN WITH MINERALS) TABS tablet, Take 1 tablet by mouth daily., Disp: , Rfl:  .  PROAIR HFA 108 (90 Base) MCG/ACT inhaler, INL 2 PFS PO Q 4 TO 6 H PRF WHZ, Disp: , Rfl:  .  REPATHA 140 MG/ML SOSY, INJECT 1 SYRINGE INTO THE SKIN Q 2 WEEKS, Disp: , Rfl:  .  spironolactone (ALDACTONE) 25 MG tablet, TAKE 1 TABLET DAILY (Patient taking differently: Take 25 mg by mouth daily. ), Disp: 90 tablet, Rfl: 3 .  vitamin E 400 UNIT capsule, Take 400 Units by mouth daily., Disp: , Rfl:   Allergies  Allergen Reactions  . Augmentin [Amoxicillin-Pot Clavulanate] Hives and Other (See Comments)    Has patient had a PCN reaction causing immediate rash, facial/tongue/throat swelling, SOB or lightheadedness with hypotension: No Has patient had a PCN reaction causing severe rash involving mucus membranes or skin necrosis: No Has patient had a PCN reaction that required hospitalization No Has patient had a PCN reaction occurring within the last 10 years: No If all of the above answers are "NO", then may proceed with Cephalosporin use.  . Bleomycin Cough   Objective: Vitals:   07/01/18 0834  Temp: (!) 97.5 F (36.4 C)   Vascular Examination: Capillary refill time less than 3 seconds  x 10 digits.  Dorsalis  pedis and Posterior tibial pulses palpable b/l.  Digital hair remains sparse x 10 digits.  Skin temperature gradient WNL b/l.  Dermatological Examination: Skin with normal turgor, texture and tone b/l.  Toenails 1-5 b/l discolored, thick, dystrophic with subungual debris and pain with palpation to nailbeds due to thickness of nails.  Musculoskeletal: Muscle strength 5/5 to all LE muscle groups.  No gross bony deformities b/l.  No pain, crepitus or joint limitation noted with ROM.   Neurological: Sensation intact with 10 gram monofilament.  Vibratory sensation intact.  Assessment: Painful onychomycosis toenails 1-5 b/l  NIDDM  Plan: 1. Toenails 1-5 b/l were  debrided in length and girth without iatrogenic bleeding. 2. Patient to continue soft, supportive shoe gear daily. 3. Patient to report any pedal injuries to medical professional immediately. 4. Follow up 3 months.  5. Patient/POA to call should there be a concern in the interim.

## 2018-07-07 ENCOUNTER — Encounter: Payer: Self-pay | Admitting: Podiatry

## 2018-07-19 ENCOUNTER — Ambulatory Visit (HOSPITAL_COMMUNITY)
Admission: RE | Admit: 2018-07-19 | Discharge: 2018-07-19 | Disposition: A | Discharge status: Home / Self Care | Payer: BLUE CROSS/BLUE SHIELD | Source: Ambulatory Visit | Attending: Internal Medicine | Admitting: Internal Medicine

## 2018-07-19 ENCOUNTER — Other Ambulatory Visit: Payer: Self-pay

## 2018-07-19 DIAGNOSIS — Z1231 Encounter for screening mammogram for malignant neoplasm of breast: Secondary | ICD-10-CM

## 2018-07-19 NOTE — Progress Notes (Signed)
Please schedule follow-up with Dr. Raliegh Ip or Lala Lund to review.  Please update ordering provider

## 2018-08-12 DIAGNOSIS — E1169 Type 2 diabetes mellitus with other specified complication: Secondary | ICD-10-CM | POA: Diagnosis not present

## 2018-08-12 DIAGNOSIS — E782 Mixed hyperlipidemia: Secondary | ICD-10-CM | POA: Diagnosis not present

## 2018-08-12 DIAGNOSIS — I10 Essential (primary) hypertension: Secondary | ICD-10-CM | POA: Diagnosis not present

## 2018-08-12 DIAGNOSIS — E119 Type 2 diabetes mellitus without complications: Secondary | ICD-10-CM | POA: Diagnosis not present

## 2018-08-16 DIAGNOSIS — E782 Mixed hyperlipidemia: Secondary | ICD-10-CM | POA: Diagnosis not present

## 2018-08-16 DIAGNOSIS — I10 Essential (primary) hypertension: Secondary | ICD-10-CM | POA: Diagnosis not present

## 2018-08-16 DIAGNOSIS — R945 Abnormal results of liver function studies: Secondary | ICD-10-CM | POA: Diagnosis not present

## 2018-08-16 DIAGNOSIS — E1169 Type 2 diabetes mellitus with other specified complication: Secondary | ICD-10-CM | POA: Diagnosis not present

## 2018-08-19 ENCOUNTER — Other Ambulatory Visit: Payer: Self-pay | Admitting: Internal Medicine

## 2018-08-19 DIAGNOSIS — Z780 Asymptomatic menopausal state: Secondary | ICD-10-CM

## 2018-09-05 ENCOUNTER — Other Ambulatory Visit: Payer: BC Managed Care – PPO

## 2018-09-05 ENCOUNTER — Other Ambulatory Visit: Payer: Self-pay

## 2018-09-05 DIAGNOSIS — I10 Essential (primary) hypertension: Secondary | ICD-10-CM | POA: Diagnosis not present

## 2018-09-05 DIAGNOSIS — Z79899 Other long term (current) drug therapy: Secondary | ICD-10-CM | POA: Diagnosis not present

## 2018-09-05 DIAGNOSIS — B20 Human immunodeficiency virus [HIV] disease: Secondary | ICD-10-CM | POA: Diagnosis not present

## 2018-09-05 DIAGNOSIS — E785 Hyperlipidemia, unspecified: Secondary | ICD-10-CM | POA: Diagnosis not present

## 2018-09-06 LAB — T-HELPER CELL (CD4) - (RCID CLINIC ONLY)
CD4 % Helper T Cell: 36 % (ref 33–65)
CD4 T Cell Abs: 778 /uL (ref 400–1790)

## 2018-09-11 LAB — COMPREHENSIVE METABOLIC PANEL
AG Ratio: 1.6 (calc) (ref 1.0–2.5)
ALT: 62 U/L — ABNORMAL HIGH (ref 6–29)
AST: 48 U/L — ABNORMAL HIGH (ref 10–35)
Albumin: 4.5 g/dL (ref 3.6–5.1)
Alkaline phosphatase (APISO): 39 U/L (ref 37–153)
BUN: 14 mg/dL (ref 7–25)
CO2: 24 mmol/L (ref 20–32)
Calcium: 10.3 mg/dL (ref 8.6–10.4)
Chloride: 101 mmol/L (ref 98–110)
Creat: 0.68 mg/dL (ref 0.50–0.99)
Globulin: 2.8 g/dL (calc) (ref 1.9–3.7)
Glucose, Bld: 173 mg/dL — ABNORMAL HIGH (ref 65–99)
Potassium: 4.6 mmol/L (ref 3.5–5.3)
Sodium: 139 mmol/L (ref 135–146)
Total Bilirubin: 0.6 mg/dL (ref 0.2–1.2)
Total Protein: 7.3 g/dL (ref 6.1–8.1)

## 2018-09-11 LAB — CBC
HCT: 46.3 % — ABNORMAL HIGH (ref 35.0–45.0)
Hemoglobin: 16.2 g/dL — ABNORMAL HIGH (ref 11.7–15.5)
MCH: 31.2 pg (ref 27.0–33.0)
MCHC: 35 g/dL (ref 32.0–36.0)
MCV: 89.2 fL (ref 80.0–100.0)
MPV: 9.6 fL (ref 7.5–12.5)
Platelets: 273 10*3/uL (ref 140–400)
RBC: 5.19 10*6/uL — ABNORMAL HIGH (ref 3.80–5.10)
RDW: 13.1 % (ref 11.0–15.0)
WBC: 7.1 10*3/uL (ref 3.8–10.8)

## 2018-09-11 LAB — LIPID PANEL
Cholesterol: 200 mg/dL — ABNORMAL HIGH (ref <200)
HDL: 35 mg/dL — ABNORMAL LOW (ref >=50)
Non-HDL Cholesterol (Calc): 165 mg/dL (calc) — ABNORMAL HIGH (ref <130)
Total CHOL/HDL Ratio: 5.7 (calc) — ABNORMAL HIGH (ref <5.0)
Triglycerides: 456 mg/dL — ABNORMAL HIGH (ref <150)

## 2018-09-11 LAB — RPR: RPR Ser Ql: NONREACTIVE

## 2018-09-11 LAB — HIV-1 RNA QUANT-NO REFLEX-BLD
HIV 1 RNA Quant: <20 copies/mL
HIV-1 RNA Quant, Log: <1.3 Log copies/mL

## 2018-09-19 ENCOUNTER — Other Ambulatory Visit: Payer: Self-pay

## 2018-09-19 ENCOUNTER — Encounter: Payer: Self-pay | Admitting: Internal Medicine

## 2018-09-19 ENCOUNTER — Ambulatory Visit: Payer: BC Managed Care – PPO | Admitting: Internal Medicine

## 2018-09-19 DIAGNOSIS — B20 Human immunodeficiency virus [HIV] disease: Secondary | ICD-10-CM

## 2018-09-19 MED ORDER — ODEFSEY 200-25-25 MG PO TABS: 1.0000 | ORAL_TABLET | Freq: Every day | ORAL | 3 refills | Status: DC

## 2018-09-19 MED ORDER — TIVICAY 50 MG PO TABS: 50.0000 mg | ORAL_TABLET | Freq: Every day | ORAL | 3 refills | Status: DC

## 2018-09-19 NOTE — Progress Notes (Signed)
Patient Active Problem List   Diagnosis Date Noted  . Human immunodeficiency virus (HIV) disease ([place]) 02/23/2006    Priority: High  . Pain in left knee 02/19/2018  . 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 Acute deep vein thrombosis (DVT) of popliteal vein of left lower extremity ([place]) 02/09/2016  . Unintentional weight loss 12/16/2015  . Congestive heart failure with cardiomyopathy ([place]) 10/27/2015  . Acute congestive heart failure ([place]) 10/27/2015  . Hodgkin's disease, nodular sclerosis, of lymph nodes of multiple sites ([place]) 08/23/2015  . Visual floaters 09/12/2011  . Episodic low back pain 11/15/2010  . ROTATOR CUFF INJURY, RIGHT SHOULDER 11/03/2008  . ADHESIVE CAPSULITIS OF SHOULDER 11/12/2007  . OBESITY NOS 05/17/2006  . SYNDROME, CARPAL TUNNEL 05/17/2006  . FOOT PAIN 05/17/2006  . Hyperlipidemia 02/23/2006  . DEPRESSION 02/23/2006  . Essential hypertension 02/23/2006    Patient's Medications  New Prescriptions   No medications on file  Previous Medications   ACETAMINOPHEN (TYLENOL) 650 MG CR TABLET    Take 1,300 mg by mouth every 8 (eight) hours as needed for pain.   CHOLECALCIFEROL (VITAMIN D) 1000 UNITS TABLET    Take 1,000 Units by mouth daily.    FENOFIBRATE 160 MG TABLET    Take 160 mg by mouth daily.    FEXOFENADINE (ALLEGRA) 180 MG TABLET    Take 180 mg by mouth daily as needed for allergies.    FLUTICASONE (FLONASE) 50 MCG/ACT NASAL SPRAY    Place 1 spray into both nostrils daily as needed for allergies.    FUROSEMIDE (LASIX) 20 MG TABLET    Take 1 tablet (20 mg total) by mouth daily.   GLUCOSAMINE-CHONDROITIN PO    Take 3 tablets by mouth daily.    JARDIANCE 25 MG TABS TABLET    Take 25 mg by mouth daily.    METFORMIN (GLUCOPHAGE-XR) 500 MG 24 HR TABLET    Take 1,000 mg by mouth 2 (two) times daily as needed.   METHOCARBAMOL (ROBAXIN) 750 MG TABLET       METOPROLOL TARTRATE (LOPRESSOR) 25 MG TABLET    Take 1  tablet (25 mg total) by mouth daily.   MULTIPLE VITAMIN (MULTIVITAMIN WITH MINERALS) TABS TABLET    Take 1 tablet by mouth daily.   PROAIR HFA 108 (90 BASE) MCG/ACT INHALER    INL 2 PFS PO Q 4 TO 6 H PRF WHZ   REPATHA 140 MG/ML SOSY    INJECT 1 SYRINGE INTO THE SKIN Q 2 WEEKS   SPIRONOLACTONE (ALDACTONE) 25 MG TABLET    TAKE 1 TABLET DAILY   VITAMIN E 400 UNIT CAPSULE    Take 400 Units by mouth daily.  Modified Medications   Modified Medication Previous Medication   DOLUTEGRAVIR (TIVICAY) 50 MG TABLET dolutegravir (TIVICAY) 50 MG tablet      Take 1 tablet (50 mg total) by mouth daily. Take with Odefsey.    Take 1 tablet (50 mg total) by mouth daily. Take with Odefsey.   EMTRICITABINE-RILPIVIR-TENOFOVIR AF (ODEFSEY) 200-25-25 MG TABS TABLET emtricitabine-rilpivir-tenofovir AF (ODEFSEY) 200-25-25 MG TABS tablet      Take 1 tablet by mouth daily with breakfast. Take with Tivicay.    Take 1 tablet by mouth daily with breakfast. Take with Tivicay.  Discontinued Medications   DOXYCYCLINE (VIBRAMYCIN) 100 MG CAPSULE       ICOSAPENT ETHYL (VASCEPA) 1 G CAPS    Take 1 g  by mouth 2 (two) times daily.    JANUMET XR [phone] MG TB24    Take 1 tablet by mouth 2 (two) times daily.   LIDOCAINE-PRILOCAINE (EMLA) CREAM    Apply 1 application topically daily as needed (prior to accessing port).     Subjective: Holly Stephens is in for her routine HIV follow-up visit.  She has had no problems obtaining, taking or tolerating her Odefsey or Tivicay.  She does not recall missing any doses.  Her lymphoma remains him to shin.  Her Port-A-Cath was removed last November.  She was out of work on unemployment for 8 weeks during the early part of the [place] pandemic.  Back at work with a small group of coworkers.  Masks and she feels safe.  Review of Systems: Review of Systems  Constitutional: Negative for fever.  Respiratory: Negative for cough and shortness of breath.   Cardiovascular: Negative for chest pain.   Gastrointestinal: Negative for abdominal pain, diarrhea, nausea and vomiting.  Psychiatric/Behavioral: Negative for depression.    Past Medical History:  Diagnosis Date  . Acute deep vein thrombosis (DVT) of popliteal vein of left lower extremity ([place]) 02/09/2016  . Arthritis    osteoarthritis  . Diabetes mellitus without complication ([place])   . History of radiation therapy 04/25/2016 - 05/23/2016   Left Axilla treated to 36 Gy in 20 fractions  . HIV infection ([place])   . [name], nodular sclerosis ([place]) 08/23/2015  . Human immunodeficiency virus (HIV) disease ([place]) 02/23/2006   Qualifier: Diagnosis of  By: Megan Salon MD, Clarann Helvey      Social History   Tobacco Use  . Smoking status: Never Smoker  . Smokeless tobacco: Never Used  Substance Use Topics  . Alcohol use: No  . Drug use: No    Family History  Problem Relation Age of Onset  . Hypertension Mother   . Cancer Mother 68       breast   . Hypertension Father   . Heart attack Father   . Pulmonary embolism Sister   . Parkinson's disease Brother     Allergies  Allergen Reactions  . Augmentin [Amoxicillin-Pot Clavulanate] Hives and Other (See Comments)    Has patient had a PCN reaction causing immediate rash, facial/tongue/throat swelling, SOB or lightheadedness with hypotension: No Has patient had a PCN reaction causing severe rash involving mucus membranes or skin necrosis: No Has patient had a PCN reaction that required hospitalization No Has patient had a PCN reaction occurring within the last 10 years: No If all of the above answers are "NO", then may proceed with Cephalosporin use.  . Bleomycin Cough    Health Maintenance  Topic Date Due  . HEMOGLOBIN A1C  11/15/55  . OPHTHALMOLOGY EXAM  06/26/1965  . URINE MICROALBUMIN  06/26/1965  . COLONOSCOPY  06/26/2005  . PAP SMEAR-Modifier  09/15/2017  . FOOT EXAM  07/01/2019  . MAMMOGRAM  07/18/2020  . TETANUS/TDAP  11/06/2022  . PNEUMOCOCCAL POLYSACCHARIDE  VACCINE AGE 4-64 HIGH RISK  Completed  . Hepatitis C Screening  Completed  . HIV Screening  Completed    Objective:  Vitals:   09/19/18 1342  BP: 119/71  Pulse: 92  Temp: 98 F (36.7 C)   There is no height or weight on file to calculate BMI.  Physical Exam Constitutional:      Comments: She is in good spirits.  Cardiovascular:     Rate and Rhythm: Normal rate and regular rhythm.     Heart sounds: No  murmur.  Pulmonary:     Effort: Pulmonary effort is normal.     Breath sounds: Normal breath sounds.  Abdominal:     Palpations: Abdomen is soft.     Tenderness: There is no abdominal tenderness.  Psychiatric:        Mood and Affect: Mood normal.     Lab Results Lab Results  Component Value Date   WBC 7.1 09/05/2018   HGB 16.2 (H) 09/05/2018   HCT 46.3 (H) 09/05/2018   MCV 89.2 09/05/2018   PLT 273 09/05/2018    Lab Results  Component Value Date   CREATININE 0.68 09/05/2018   BUN 14 09/05/2018   NA 139 09/05/2018   K 4.6 09/05/2018   CL 101 09/05/2018   CO2 24 09/05/2018    Lab Results  Component Value Date   ALT 62 (H) 09/05/2018   AST 48 (H) 09/05/2018   ALKPHOS 38 06/14/2018   BILITOT 0.6 09/05/2018    Lab Results  Component Value Date   CHOL 200 (H) 09/05/2018   HDL 35 (L) 09/05/2018   [place]  09/05/2018     Comment:     . LDL cholesterol not calculated. Triglyceride levels greater than 400 mg/dL invalidate calculated LDL results. . Reference range: <100 . Desirable range <100 mg/dL for primary prevention;   <70 mg/dL for patients with CHD or diabetic patients  with > or = 2 CHD risk factors. Marland Kitchen LDL-C is now calculated using the Martin-Hopkins  calculation, which is a validated novel method providing  better accuracy than the Friedewald equation in the  estimation of LDL-C.  Cresenciano Genre et al. Annamaria Helling. 5277;824(23): 2061-2068  (http://education.[employer].com/faq/FAQ164)    TRIG 456 (H) 09/05/2018   CHOLHDL 5.7 (H) 09/05/2018   Lab  Results  Component Value Date   LABRPR NON-REACTIVE 09/05/2018   HIV 1 RNA Quant (copies/mL)  Date Value  09/05/2018 <20 NOT DETECTED  09/06/2017 <20 NOT DETECTED  09/05/2016 <20 DETECTED (A)   CD4 T Cell Abs (/uL)  Date Value  09/05/2018 778  09/06/2017 790  09/05/2016 810     Problem List Items Addressed This Visit      High   Human immunodeficiency virus (HIV) disease ([place])    Her infection remains under excellent, long-term control.  She will continue her current antiretroviral regimen and follow-up after lab in 1 year.      Relevant Medications   dolutegravir (TIVICAY) 50 MG tablet   emtricitabine-rilpivir-tenofovir AF (ODEFSEY) 200-25-25 MG TABS tablet   Other Relevant Orders   CBC   T-helper cell (CD4)- ([facility] clinic only)   Comprehensive metabolic panel   Lipid panel   RPR   HIV-1 RNA quant-no reflex-bld        Michel Bickers, MD [facility] [place] [phone] pager   [phone] cell 09/19/2018, 1:58 PM

## 2018-09-19 NOTE — Assessment & Plan Note (Signed)
Her infection remains under excellent, long-term control.  She will continue her current antiretroviral regimen and follow-up after lab in 1 year.

## 2018-10-02 ENCOUNTER — Ambulatory Visit: Payer: BLUE CROSS/BLUE SHIELD | Admitting: Podiatry

## 2018-10-02 ENCOUNTER — Encounter: Payer: Self-pay | Admitting: Podiatry

## 2018-10-02 ENCOUNTER — Other Ambulatory Visit: Payer: Self-pay

## 2018-10-02 VITALS — Temp 97.9°F

## 2018-10-02 DIAGNOSIS — M79675 Pain in left toe(s): Secondary | ICD-10-CM

## 2018-10-02 DIAGNOSIS — M79674 Pain in right toe(s): Secondary | ICD-10-CM

## 2018-10-02 DIAGNOSIS — B351 Tinea unguium: Secondary | ICD-10-CM | POA: Diagnosis not present

## 2018-10-02 NOTE — Patient Instructions (Signed)
Diabetes Mellitus and Foot Care Foot care is an important part of your health, especially when you have diabetes. Diabetes may cause you to have problems because of poor blood flow (circulation) to your feet and legs, which can cause your skin to:  Become thinner and drier.  Break more easily.  Heal more slowly.  Peel and crack. You may also have nerve damage (neuropathy) in your legs and feet, causing decreased feeling in them. This means that you may not notice minor injuries to your feet that could lead to more serious problems. Noticing and addressing any potential problems early is the best way to prevent future foot problems. How to care for your feet Foot hygiene  Wash your feet daily with warm water and mild soap. Do not use hot water. Then, pat your feet and the areas between your toes until they are completely dry. Do not soak your feet as this can dry your skin.  Trim your toenails straight across. Do not dig under them or around the cuticle. File the edges of your nails with an emery board or nail file.  Apply a moisturizing lotion or petroleum jelly to the skin on your feet and to dry, brittle toenails. Use lotion that does not contain alcohol and is unscented. Do not apply lotion between your toes. Shoes and socks  Wear clean socks or stockings every day. Make sure they are not too tight. Do not wear knee-high stockings since they may decrease blood flow to your legs.  Wear shoes that fit properly and have enough cushioning. Always look in your shoes before you put them on to be sure there are no objects inside.  To break in new shoes, wear them for just a few hours a day. This prevents injuries on your feet. Wounds, scrapes, corns, and calluses  Check your feet daily for blisters, cuts, bruises, sores, and redness. If you cannot see the bottom of your feet, use a mirror or ask someone for help.  Do not cut corns or calluses or try to remove them with medicine.  If you  find a minor scrape, cut, or break in the skin on your feet, keep it and the skin around it clean and dry. You may clean these areas with mild soap and water. Do not clean the area with peroxide, alcohol, or iodine.  If you have a wound, scrape, corn, or callus on your foot, look at it several times a day to make sure it is healing and not infected. Check for: ? Redness, swelling, or pain. ? Fluid or blood. ? Warmth. ? Pus or a bad smell. General instructions  Do not cross your legs. This may decrease blood flow to your feet.  Do not use heating pads or hot water bottles on your feet. They may burn your skin. If you have lost feeling in your feet or legs, you may not know this is happening until it is too late.  Protect your feet from hot and cold by wearing shoes, such as at the beach or on hot pavement.  Schedule a complete foot exam at least once a year (annually) or more often if you have foot problems. If you have foot problems, report any cuts, sores, or bruises to your health care provider immediately. Contact a health care provider if:  You have a medical condition that increases your risk of infection and you have any cuts, sores, or bruises on your feet.  You have an injury that is not   healing.  You have redness on your legs or feet.  You feel burning or tingling in your legs or feet.  You have pain or cramps in your legs and feet.  Your legs or feet are numb.  Your feet always feel cold.  You have pain around a toenail. Get help right away if:  You have a wound, scrape, corn, or callus on your foot and: ? You have pain, swelling, or redness that gets worse. ? You have fluid or blood coming from the wound, scrape, corn, or callus. ? Your wound, scrape, corn, or callus feels warm to the touch. ? You have pus or a bad smell coming from the wound, scrape, corn, or callus. ? You have a fever. ? You have a red line going up your leg. Summary  Check your feet every day  for cuts, sores, red spots, swelling, and blisters.  Moisturize feet and legs daily.  Wear shoes that fit properly and have enough cushioning.  If you have foot problems, report any cuts, sores, or bruises to your health care provider immediately.  Schedule a complete foot exam at least once a year (annually) or more often if you have foot problems. This information is not intended to replace advice given to you by your health care provider. Make sure you discuss any questions you have with your health care provider. Document Released: 02/04/2000 Document Revised: 03/21/2017 Document Reviewed: 03/10/2016 Elsevier Patient Education  2020 Elsevier Inc.   Onychomycosis/Fungal Toenails  WHAT IS IT? An infection that lies within the keratin of your nail plate that is caused by a fungus.  WHY ME? Fungal infections affect all ages, sexes, races, and creeds.  There may be many factors that predispose you to a fungal infection such as age, coexisting medical conditions such as diabetes, or an autoimmune disease; stress, medications, fatigue, genetics, etc.  Bottom line: fungus thrives in a warm, moist environment and your shoes offer such a location.  IS IT CONTAGIOUS? Theoretically, yes.  You do not want to share shoes, nail clippers or files with someone who has fungal toenails.  Walking around barefoot in the same room or sleeping in the same bed is unlikely to transfer the organism.  It is important to realize, however, that fungus can spread easily from one nail to the next on the same foot.  HOW DO WE TREAT THIS?  There are several ways to treat this condition.  Treatment may depend on many factors such as age, medications, pregnancy, liver and kidney conditions, etc.  It is best to ask your doctor which options are available to you.  1. No treatment.   Unlike many other medical concerns, you can live with this condition.  However for many people this can be a painful condition and may lead to  ingrown toenails or a bacterial infection.  It is recommended that you keep the nails cut short to help reduce the amount of fungal nail. 2. Topical treatment.  These range from herbal remedies to prescription strength nail lacquers.  About 40-50% effective, topicals require twice daily application for approximately 9 to 12 months or until an entirely new nail has grown out.  The most effective topicals are medical grade medications available through physicians offices. 3. Oral antifungal medications.  With an 80-90% cure rate, the most common oral medication requires 3 to 4 months of therapy and stays in your system for a year as the new nail grows out.  Oral antifungal medications do require   blood work to make sure it is a safe drug for you.  A liver function panel will be performed prior to starting the medication and after the first month of treatment.  It is important to have the blood work performed to avoid any harmful side effects.  In general, this medication safe but blood work is required. 4. Laser Therapy.  This treatment is performed by applying a specialized laser to the affected nail plate.  This therapy is noninvasive, fast, and non-painful.  It is not covered by insurance and is therefore, out of pocket.  The results have been very good with a 80-95% cure rate.  The [facility] is the only practice in the area to offer this therapy. 5. Permanent Nail Avulsion.  Removing the entire nail so that a new nail will not grow back. 

## 2018-10-13 NOTE — Progress Notes (Signed)
Subjective:  Holly Stephens presents to clinic today with cc of  painful, thick, discolored, elongated toenails 1-5 b/l that become tender and cannot cut because of thickness. Pain is aggravated when wearing enclosed shoe gear and relieved with periodic professional debridement.   Current Outpatient Medications:  .  acetaminophen (TYLENOL) 650 MG CR tablet, Take 1,300 mg by mouth every 8 (eight) hours as needed for pain., Disp: , Rfl:  .  cholecalciferol (VITAMIN D) 1000 units tablet, Take 1,000 Units by mouth daily. , Disp: , Rfl:  .  dolutegravir (TIVICAY) 50 MG tablet, Take 1 tablet (50 mg total) by mouth daily. Take with Odefsey., Disp: 90 tablet, Rfl: 3 .  emtricitabine-rilpivir-tenofovir AF (ODEFSEY) 200-25-25 MG TABS tablet, Take 1 tablet by mouth daily with breakfast. Take with Tivicay., Disp: 90 tablet, Rfl: 3 .  fenofibrate 160 MG tablet, Take 160 mg by mouth daily. , Disp: , Rfl:  .  fexofenadine (ALLEGRA) 180 MG tablet, Take 180 mg by mouth daily as needed for allergies. , Disp: , Rfl:  .  fluticasone (FLONASE) 50 MCG/ACT nasal spray, Place 1 spray into both nostrils daily as needed for allergies. , Disp: , Rfl: 0 .  furosemide (LASIX) 20 MG tablet, Take 1 tablet (20 mg total) by mouth daily., Disp: 90 tablet, Rfl: 2 .  GLUCOSAMINE-CHONDROITIN PO, Take 3 tablets by mouth daily. , Disp: , Rfl:  .  JARDIANCE 25 MG TABS tablet, Take 25 mg by mouth daily. , Disp: , Rfl:  .  metFORMIN (GLUCOPHAGE-XR) 500 MG 24 hr tablet, Take 1,000 mg by mouth 2 (two) times daily as needed., Disp: , Rfl:  .  methocarbamol (ROBAXIN) 750 MG tablet, , Disp: , Rfl:  .  metoprolol tartrate (LOPRESSOR) 25 MG tablet, Take 1 tablet (25 mg total) by mouth daily., Disp: 30 tablet, Rfl: 0 .  Multiple Vitamin (MULTIVITAMIN WITH MINERALS) TABS tablet, Take 1 tablet by mouth daily., Disp: , Rfl:  .  PROAIR HFA 108 (90 Base) MCG/ACT inhaler, INL 2 PFS PO Q 4 TO 6 H PRF WHZ, Disp: , Rfl:  .  REPATHA 140 MG/ML SOSY,  INJECT 1 SYRINGE INTO THE SKIN Q 2 WEEKS, Disp: , Rfl:  .  spironolactone (ALDACTONE) 25 MG tablet, TAKE 1 TABLET DAILY (Patient taking differently: Take 25 mg by mouth daily. ), Disp: 90 tablet, Rfl: 3 .  vitamin E 400 UNIT capsule, Take 400 Units by mouth daily., Disp: , Rfl:    Allergies  Allergen Reactions  . Augmentin [Amoxicillin-Pot Clavulanate] Hives and Other (See Comments)    Has patient had a PCN reaction causing immediate rash, facial/tongue/throat swelling, SOB or lightheadedness with hypotension: No Has patient had a PCN reaction causing severe rash involving mucus membranes or skin necrosis: No Has patient had a PCN reaction that required hospitalization No Has patient had a PCN reaction occurring within the last 10 years: No If all of the above answers are "NO", then may proceed with Cephalosporin use.  . Bleomycin Cough  . Cefpodoxime Proxetil Itching and Rash     Objective: Vitals:   10/02/18 1605  Temp: 97.9 F (36.6 C)    Physical Examination: Unchanged from previous visit.  Vascular Examination: Capillary refill time <3 seconds x 10 digits.  Palpable DP/PT pulses b/l.  Digital hair sparse b/l.  No edema noted b/l.  Skin temperature gradient WNL b/l.  Dermatological Examination: Skin with normal turgor, texture and tone b/l.  No open wounds b/l.  No interdigital macerations  noted b/l.  Elongated, thick, discolored brittle toenails with subungual debris and pain on dorsal palpation of nailbeds 1-5 b/l.  Musculoskeletal Examination: Muscle strength 5/5 to all muscle groups b/l.  No pain, crepitus or joint discomfort with active/passive ROM.  Neurological Examination: Sensation intact 5/5 b/l with 10 gram monofilament.  Assessment: Mycotic nail infection with pain 1-5 b/l  Plan: 1. Toenails 1-5 b/l were debrided in length and girth without iatrogenic laceration. 2.  Continue soft, supportive shoe gear daily. 3.  Report any pedal injuries to  medical professional. 4.  Follow up 3 months. 5.  Patient/POA to call should there be a question/concern in there interim.

## 2018-12-26 ENCOUNTER — Inpatient Hospital Stay (HOSPITAL_COMMUNITY): Payer: BC Managed Care – PPO | Attending: Hematology

## 2018-12-26 ENCOUNTER — Other Ambulatory Visit: Payer: Self-pay

## 2018-12-26 DIAGNOSIS — Z7984 Long term (current) use of oral hypoglycemic drugs: Secondary | ICD-10-CM | POA: Diagnosis not present

## 2018-12-26 DIAGNOSIS — Z880 Allergy status to penicillin: Secondary | ICD-10-CM | POA: Diagnosis not present

## 2018-12-26 DIAGNOSIS — Z803 Family history of malignant neoplasm of breast: Secondary | ICD-10-CM | POA: Diagnosis not present

## 2018-12-26 DIAGNOSIS — K439 Ventral hernia without obstruction or gangrene: Secondary | ICD-10-CM | POA: Diagnosis not present

## 2018-12-26 DIAGNOSIS — B20 Human immunodeficiency virus [HIV] disease: Secondary | ICD-10-CM | POA: Diagnosis not present

## 2018-12-26 DIAGNOSIS — Z9221 Personal history of antineoplastic chemotherapy: Secondary | ICD-10-CM | POA: Diagnosis not present

## 2018-12-26 DIAGNOSIS — Z8249 Family history of ischemic heart disease and other diseases of the circulatory system: Secondary | ICD-10-CM | POA: Diagnosis not present

## 2018-12-26 DIAGNOSIS — T451X5A Adverse effect of antineoplastic and immunosuppressive drugs, initial encounter: Secondary | ICD-10-CM | POA: Insufficient documentation

## 2018-12-26 DIAGNOSIS — R140 Abdominal distension (gaseous): Secondary | ICD-10-CM | POA: Diagnosis not present

## 2018-12-26 DIAGNOSIS — Z86718 Personal history of other venous thrombosis and embolism: Secondary | ICD-10-CM | POA: Insufficient documentation

## 2018-12-26 DIAGNOSIS — I5089 Other heart failure: Secondary | ICD-10-CM | POA: Insufficient documentation

## 2018-12-26 DIAGNOSIS — C8118 Nodular sclerosis classical Hodgkin lymphoma, lymph nodes of multiple sites: Secondary | ICD-10-CM | POA: Insufficient documentation

## 2018-12-26 DIAGNOSIS — Z881 Allergy status to other antibiotic agents status: Secondary | ICD-10-CM | POA: Diagnosis not present

## 2018-12-26 DIAGNOSIS — Z79899 Other long term (current) drug therapy: Secondary | ICD-10-CM | POA: Diagnosis not present

## 2018-12-26 DIAGNOSIS — Z923 Personal history of irradiation: Secondary | ICD-10-CM | POA: Insufficient documentation

## 2018-12-26 DIAGNOSIS — D3502 Benign neoplasm of left adrenal gland: Secondary | ICD-10-CM | POA: Insufficient documentation

## 2018-12-26 DIAGNOSIS — R61 Generalized hyperhidrosis: Secondary | ICD-10-CM | POA: Diagnosis not present

## 2018-12-26 DIAGNOSIS — E119 Type 2 diabetes mellitus without complications: Secondary | ICD-10-CM | POA: Diagnosis not present

## 2018-12-26 DIAGNOSIS — Z836 Family history of other diseases of the respiratory system: Secondary | ICD-10-CM | POA: Insufficient documentation

## 2018-12-26 DIAGNOSIS — R200 Anesthesia of skin: Secondary | ICD-10-CM | POA: Insufficient documentation

## 2018-12-26 DIAGNOSIS — I700 Atherosclerosis of aorta: Secondary | ICD-10-CM | POA: Insufficient documentation

## 2018-12-26 DIAGNOSIS — C8114 Nodular sclerosis classical Hodgkin lymphoma, lymph nodes of axilla and upper limb: Secondary | ICD-10-CM

## 2018-12-26 LAB — CBC WITH DIFFERENTIAL/PLATELET
Abs Immature Granulocytes: 0.04 10*3/uL (ref 0.00–0.07)
Basophils Absolute: 0.1 10*3/uL (ref 0.0–0.1)
Basophils Relative: 1 %
Eosinophils Absolute: 0.2 10*3/uL (ref 0.0–0.5)
Eosinophils Relative: 2 %
HCT: 53.6 % — ABNORMAL HIGH (ref 36.0–46.0)
Hemoglobin: 16.6 g/dL — ABNORMAL HIGH (ref 12.0–15.0)
Immature Granulocytes: 1 %
Lymphocytes Relative: 40 %
Lymphs Abs: 3.5 10*3/uL (ref 0.7–4.0)
MCH: 30.2 pg (ref 26.0–34.0)
MCHC: 31 g/dL (ref 30.0–36.0)
MCV: 97.5 fL (ref 80.0–100.0)
Monocytes Absolute: 0.6 10*3/uL (ref 0.1–1.0)
Monocytes Relative: 7 %
Neutro Abs: 4.4 10*3/uL (ref 1.7–7.7)
Neutrophils Relative %: 49 %
Platelets: 221 10*3/uL (ref 150–400)
RBC: 5.5 MIL/uL — ABNORMAL HIGH (ref 3.87–5.11)
RDW: 13.2 % (ref 11.5–15.5)
WBC: 8.7 10*3/uL (ref 4.0–10.5)
nRBC: 0 % (ref 0.0–0.2)

## 2018-12-26 LAB — COMPREHENSIVE METABOLIC PANEL
ALT: 46 U/L — ABNORMAL HIGH (ref 0–44)
AST: 39 U/L (ref 15–41)
Albumin: 4.4 g/dL (ref 3.5–5.0)
Alkaline Phosphatase: 47 U/L (ref 38–126)
Anion gap: 13 (ref 5–15)
BUN: 19 mg/dL (ref 8–23)
CO2: 24 mmol/L (ref 22–32)
Calcium: 9.8 mg/dL (ref 8.9–10.3)
Chloride: 100 mmol/L (ref 98–111)
Creatinine, Ser: 0.69 mg/dL (ref 0.44–1.00)
GFR calc Af Amer: >60 mL/min (ref >60)
GFR calc non Af Amer: >60 mL/min (ref >60)
Glucose, Bld: 214 mg/dL — ABNORMAL HIGH (ref 70–99)
Potassium: 3.7 mmol/L (ref 3.5–5.1)
Sodium: 137 mmol/L (ref 135–145)
Total Bilirubin: 0.5 mg/dL (ref 0.3–1.2)
Total Protein: 7.6 g/dL (ref 6.5–8.1)

## 2018-12-26 LAB — SEDIMENTATION RATE: Sed Rate: 3 mm/hr (ref 0–22)

## 2018-12-26 LAB — LACTATE DEHYDROGENASE: LDH: 152 U/L (ref 98–192)

## 2019-01-01 ENCOUNTER — Other Ambulatory Visit: Payer: Self-pay

## 2019-01-01 ENCOUNTER — Encounter: Payer: Self-pay | Admitting: Podiatry

## 2019-01-01 ENCOUNTER — Ambulatory Visit: Payer: BC Managed Care – PPO | Admitting: Podiatry

## 2019-01-01 DIAGNOSIS — B351 Tinea unguium: Secondary | ICD-10-CM | POA: Diagnosis not present

## 2019-01-01 DIAGNOSIS — M79675 Pain in left toe(s): Secondary | ICD-10-CM

## 2019-01-01 DIAGNOSIS — M79674 Pain in right toe(s): Secondary | ICD-10-CM | POA: Diagnosis not present

## 2019-01-02 ENCOUNTER — Encounter (HOSPITAL_COMMUNITY): Payer: Self-pay | Admitting: Hematology

## 2019-01-02 ENCOUNTER — Inpatient Hospital Stay (HOSPITAL_COMMUNITY): Payer: BC Managed Care – PPO | Admitting: Hematology

## 2019-01-02 VITALS — BP 142/66 | HR 81 | Temp 97.5°F | Resp 14 | Wt 264.0 lb

## 2019-01-02 DIAGNOSIS — D3502 Benign neoplasm of left adrenal gland: Secondary | ICD-10-CM | POA: Diagnosis not present

## 2019-01-02 DIAGNOSIS — B20 Human immunodeficiency virus [HIV] disease: Secondary | ICD-10-CM | POA: Diagnosis not present

## 2019-01-02 DIAGNOSIS — C8118 Nodular sclerosis classical Hodgkin lymphoma, lymph nodes of multiple sites: Secondary | ICD-10-CM | POA: Diagnosis not present

## 2019-01-02 DIAGNOSIS — T451X5A Adverse effect of antineoplastic and immunosuppressive drugs, initial encounter: Secondary | ICD-10-CM | POA: Diagnosis not present

## 2019-01-02 DIAGNOSIS — Z86718 Personal history of other venous thrombosis and embolism: Secondary | ICD-10-CM | POA: Diagnosis not present

## 2019-01-02 DIAGNOSIS — R61 Generalized hyperhidrosis: Secondary | ICD-10-CM | POA: Diagnosis not present

## 2019-01-02 DIAGNOSIS — K439 Ventral hernia without obstruction or gangrene: Secondary | ICD-10-CM | POA: Diagnosis not present

## 2019-01-02 DIAGNOSIS — Z79899 Other long term (current) drug therapy: Secondary | ICD-10-CM | POA: Diagnosis not present

## 2019-01-02 DIAGNOSIS — Z923 Personal history of irradiation: Secondary | ICD-10-CM | POA: Diagnosis not present

## 2019-01-02 DIAGNOSIS — Z9221 Personal history of antineoplastic chemotherapy: Secondary | ICD-10-CM | POA: Diagnosis not present

## 2019-01-02 DIAGNOSIS — I5089 Other heart failure: Secondary | ICD-10-CM | POA: Diagnosis not present

## 2019-01-02 DIAGNOSIS — C8114 Nodular sclerosis classical Hodgkin lymphoma, lymph nodes of axilla and upper limb: Secondary | ICD-10-CM

## 2019-01-02 DIAGNOSIS — E119 Type 2 diabetes mellitus without complications: Secondary | ICD-10-CM | POA: Diagnosis not present

## 2019-01-02 DIAGNOSIS — I700 Atherosclerosis of aorta: Secondary | ICD-10-CM | POA: Diagnosis not present

## 2019-01-02 DIAGNOSIS — Z7984 Long term (current) use of oral hypoglycemic drugs: Secondary | ICD-10-CM | POA: Diagnosis not present

## 2019-01-02 DIAGNOSIS — R200 Anesthesia of skin: Secondary | ICD-10-CM | POA: Diagnosis not present

## 2019-01-02 DIAGNOSIS — R140 Abdominal distension (gaseous): Secondary | ICD-10-CM | POA: Diagnosis not present

## 2019-01-02 NOTE — Assessment & Plan Note (Signed)
1.  Hodgkin's Lymphoma, nodular-sclerosing type involving multiple lymph nodes:  - Left axilla pathology:CD30, CD15, & PAX-5 positive  - ABVD chemotherapy beginning on 09/13/15, with discontinuation of Bleomycin on 10/11/15.  - Mid-course PET scan revealed Deauville 3 lymphadenopathy, so she received 4 additional cycles of AVD.   - Treatment course was complicated by diastolic heart failure  - Chemotherapy completed on 02/23/16. - Post-treatment PET revealed mild residual (L) axillary adenopathy (Deauville 2), so she was referred for axillary radiation therapy.  - Completed XRT to left axilla on 05/23/16.  - Repeat PET 6/18: Mild residual low level metabolic activity within left axillary lymph nodes a similar to prior (Deauville 2). - She has continued with clinical surveillance with scans q 52mths.  - Most recent CT CAP 06/18/2018 reveals stable bilateral axilla lymph nodes.  -She denies any fevers, night sweats or weight loss in the last 6 months.  No recurrent infections noted.  Physical exam today did not reveal any lymphadenopathy.  We reviewed her labs. -We will see her back in 6 months for follow-up with repeat CT CAP and labs.  2. HIV: - Continue follow up with Infectious Disease  3. Chemo induced heart failure - Continue follow up with Cardiology.

## 2019-01-02 NOTE — Patient Instructions (Signed)
[place] at [facility] Discharge Instructions  You were seen today by Dr. Delton Coombes. He went over your recent lab results. He will see you back in 6 months for labs, scana and follow up.   Thank you for choosing [place] [facility] to provide your oncology and hematology care.  To afford each patient quality time with our provider, please arrive at least 15 minutes before your scheduled appointment time.   If you have a lab appointment with the [facility] please come in thru the  Main Entrance and check in at the main information desk  You need to re-schedule your appointment should you arrive 10 or more minutes late.  We strive to give you quality time with our providers, and arriving late affects you and other patients whose appointments are after yours.  Also, if you no show three or more times for appointments you may be dismissed from the clinic at the providers discretion.     Again, thank you for choosing [facility].  Our hope is that these requests will decrease the amount of time that you wait before being seen by our physicians.       _____________________________________________________________  Should you have questions after your visit to [facility], please contact our office at [phone] between the hours of 8:00 a.m. and 4:30 p.m.  Voicemails left after 4:00 p.m. will not be returned until the following business day.  For prescription refill requests, have your pharmacy contact our office and allow 72 hours.    Cancer Center Support Programs:   > Cancer Support Group  2nd Tuesday of the month 1pm-2pm, Journey Room

## 2019-01-02 NOTE — Progress Notes (Signed)
[name], [place] [id number]   CLINIC:  Medical Oncology/Hematology  PCP:  Celene Squibb, MD [place] [address] [phone]   REASON FOR VISIT:  Follow-up for [name], nodular sclerosis    BRIEF ONCOLOGIC HISTORY:  Oncology History  Hodgkin's disease, nodular sclerosis, of lymph nodes of multiple sites ([place])  07/23/2015 Procedure   Lext axilla lymph node biopsy   07/27/2015 Pathology Results   Classical [name], nodular sclerosis type.  Positive CD30, CD15, andPAX-5.  Negative CD45 and CD20.   08/31/2015 PET scan   Hypermetabolic lymphadenopathy. Nodal stations include: L supraclavicular nodes, L axillary nodes, mediastinal lymph nodes, periaortic retroperitoneal and periportal lymph nodes as well as L iliac and inguinal lymph nodes. Two concerning pulm nodules...   09/01/2015 Imaging   MUGA- Normal LEFT ventricular ejection fraction of 73%.  Normal LEFT ventricular wall motion.   09/01/2015 Procedure   PFTs-  Spirometry shows mild ventilatory defect without airflow obstruction and no bronchodilator improvement. Lung volumes are normal. Airway resistance is normal. DLCO normal. Essentially normal lung function   09/08/2015 Procedure   Port placed by Dr. Rosana Hoes   09/13/2015 - 02/23/2016 Chemotherapy   ABVD   09/27/2015 Imaging   CT angio of chest- no gross PE seen. Improving axillary, mediastinal and upper abdominal adenopathy. Improved and possibly resolved RUL nodule and with resolved or obscured LLL nodule. Small L pleural effusion and mild L lower lobe atelectasis.   10/11/2015 Treatment Plan Change   Bleomycin discontinued    11/01/2015 PET scan   Clear interval response to therapy with decrease in size and hypermetabolism of larger lymph nodes seen previously and some of the smaller hypermetabolic lymph node seen on the previous study have resolved completely in terms of abnormal enlargement and  hypermetabolism. 2. Slight decrease in left lower lobe pulmonary nodule with interval resolution of the right upper lobe pulmonary nodule. 3. Stable appearance of mild heterogeneous FDG accumulation within the marrow space.   11/01/2015 Miscellaneous   Deauville 3- 4 more cycles of AVD recommended according to NCCN guidelines.   02/09/2016 Imaging   [place] left leg- Positive for acute DVT in left calf, popliteal and femoral veins.   03/22/2016 PET scan   1. Mild residual metabolic activity within LEFT axillary lymph nodes ( Deauville 2 ). 2. No additional hypermetabolic adenopathy identified. 3. Normal spleen and marrow activity.   04/25/2016 - 05/23/2016 Radiation Therapy   Residual disease treated with radiation Isidore Moos): Left axilla treated with 36 Gy in 20 fractions.   07/24/2016 PET scan   PET IMPRESSION: 1. Stable exam. The mild residual low level metabolic activity within left axillary lymph nodes a similar to prior (Deauville 2). 2. No new or progressive findings. 3. Abdominal aortic atherosclerosis.    11/23/2016 Imaging   CT C/A/P: 1. Essentially stable left axillary adenopathy. No new adenopathy identified. 2.  Aortic Atherosclerosis (ICD10-I70.0). 3. Small left adrenal adenoma. 4. Midline ventral hernia contains about 630 cc of omental adipose tissue.      CANCER STAGING: Cancer Staging Hodgkin's disease, nodular sclerosis, of lymph nodes of multiple sites [facility]) Staging form: Lymphoid Neoplasms, AJCC 6th Edition - Clinical stage from 08/31/2015: Stage III - Signed by Baird Cancer, PA-C on 09/01/2015    INTERVAL HISTORY:  Holly Stephens 63 y.o. female seen for follow-up of Hodgkin's lymphoma.  Denies any fevers, weight loss.  Has occasional night sweats which have been stable.  Appetite is 100%.  Energy levels are 75%.  Knee arthritis is reported as stable and 3 out of 10.  Numbness in the hands and feet is also stable.  Denies any recurrent infections or  hospitalizations.     REVIEW OF SYSTEMS:  Review of Systems  Neurological: Positive for numbness.  All other systems reviewed and are negative.    PAST MEDICAL/SURGICAL HISTORY:  Past Medical History:  Diagnosis Date   Acute deep vein thrombosis (DVT) of popliteal vein of left lower extremity ([place]) 02/09/2016   Arthritis    osteoarthritis   Diabetes mellitus without complication ([place])    History of radiation therapy 04/25/2016 - 05/23/2016   Left Axilla treated to 36 Gy in 20 fractions   HIV infection ([place])    [name], nodular sclerosis ([place]) 08/23/2015   Human immunodeficiency virus (HIV) disease ([place]) 02/23/2006   Qualifier: Diagnosis of  By: Megan Salon MD, John     Past Surgical History:  Procedure Laterality Date   AXILLARY LYMPH NODE BIOPSY Left 07/23/2015   Procedure: EXCISIONAL BIOPSY LEFT AXILLARY LYMPH NODE;  Surgeon: [name], MD;  Location: [facility];  Service: General;  Laterality: Left;   PORT-A-CATH REMOVAL N/A 01/09/2018   Procedure: MINOR REMOVAL PORT-A-CATH;  Surgeon: Aviva Signs, MD;  Location: [facility];  Service: General;  Laterality: N/A;   PORTACATH PLACEMENT Right 09/08/2015   Procedure: INSERTION OF CENTRAL VENOUS CATHETER WITH PORT FOR CHEMOTHERAPY;  Surgeon: [name], MD;  Location: [facility];  Service: General;  Laterality: Right;   SHOULDER SURGERY Right    removal of bone spur     SOCIAL HISTORY:  Social History   Socioeconomic History   Marital status: Widowed    Spouse name: Not on file   Number of children: Not on file   Years of education: Not on file   Highest education level: Not on file  Occupational History   Not on file  Social Needs   Financial resource strain: Not on file   Food insecurity    Worry: Not on file    Inability: Not on file   Transportation needs    Medical: Not on file    Non-medical: Not on file  Tobacco Use   Smoking status: Never Smoker   Smokeless tobacco: Never Used    Substance and Sexual Activity   Alcohol use: No   Drug use: No   Sexual activity: Never    Comment: declined condoms  Lifestyle   Physical activity    Days per week: Not on file    Minutes per session: Not on file   Stress: Not on file  Relationships   Social connections    Talks on phone: Not on file    Gets together: Not on file    Attends religious service: Not on file    Active member of club or organization: Not on file    Attends meetings of clubs or organizations: Not on file    Relationship status: Not on file   Intimate partner violence    Fear of current or ex partner: Not on file    Emotionally abused: Not on file    Physically abused: Not on file    Forced sexual activity: Not on file  Other Topics Concern   Not on file  Social History Narrative   Not on file    FAMILY HISTORY:  Family History  Problem Relation Age of Onset   Hypertension Mother    Cancer  Mother 88       breast    Hypertension Father    Heart attack Father    Pulmonary embolism Sister    Parkinson's disease Brother     CURRENT MEDICATIONS:  Outpatient Encounter Medications as of 01/02/2019  Medication Sig   cholecalciferol (VITAMIN D) 1000 units tablet Take 1,000 Units by mouth daily.    dolutegravir (TIVICAY) 50 MG tablet Take 1 tablet (50 mg total) by mouth daily. Take with Odefsey.   emtricitabine-rilpivir-tenofovir AF (ODEFSEY) 200-25-25 MG TABS tablet Take 1 tablet by mouth daily with breakfast. Take with Tivicay.   fenofibrate 160 MG tablet Take 160 mg by mouth daily.    furosemide (LASIX) 20 MG tablet Take 1 tablet (20 mg total) by mouth daily.   GLUCOSAMINE-CHONDROITIN PO Take 3 tablets by mouth daily.    JARDIANCE 25 MG TABS tablet Take 25 mg by mouth daily.    metFORMIN (GLUCOPHAGE-XR) 500 MG 24 hr tablet Take 1,000 mg by mouth 2 (two) times daily as needed.   metoprolol tartrate (LOPRESSOR) 25 MG tablet Take 1 tablet (25 mg total) by mouth daily.    Multiple Vitamin (MULTIVITAMIN WITH MINERALS) TABS tablet Take 1 tablet by mouth daily.   REPATHA 140 MG/ML SOSY INJECT 1 SYRINGE INTO THE SKIN Q 2 WEEKS   spironolactone (ALDACTONE) 25 MG tablet TAKE 1 TABLET DAILY (Patient taking differently: Take 25 mg by mouth daily. )   vitamin E 400 UNIT capsule Take 400 Units by mouth daily.   acetaminophen (TYLENOL) 650 MG CR tablet Take 1,300 mg by mouth every 8 (eight) hours as needed for pain.   fexofenadine (ALLEGRA) 180 MG tablet Take 180 mg by mouth daily as needed for allergies.    fluticasone (FLONASE) 50 MCG/ACT nasal spray Place 1 spray into both nostrils daily as needed for allergies.    methocarbamol (ROBAXIN) 750 MG tablet Take 750 mg by mouth as needed.    PROAIR HFA 108 (90 Base) MCG/ACT inhaler INL 2 PFS PO Q 4 TO 6 H PRF WHZ   No facility-administered encounter medications on file as of 01/02/2019.     ALLERGIES:  Allergies  Allergen Reactions   Augmentin [Amoxicillin-Pot Clavulanate] Hives and Other (See Comments)    Has patient had a PCN reaction causing immediate rash, facial/tongue/throat swelling, SOB or lightheadedness with hypotension: No Has patient had a PCN reaction causing severe rash involving mucus membranes or skin necrosis: No Has patient had a PCN reaction that required hospitalization No Has patient had a PCN reaction occurring within the last 10 years: No If all of the above answers are "NO", then may proceed with Cephalosporin use.   Bleomycin Cough   Cefpodoxime Proxetil Itching and Rash     PHYSICAL EXAM:  ECOG Performance status: 1  Vitals:   01/02/19 1508  BP: (!) 142/66  Pulse: 81  Resp: 14  Temp: (!) 97.5 F (36.4 C)  SpO2: 98%   Filed Weights   01/02/19 1508  Weight: 264 lb (119.7 kg)    Physical Exam Vitals signs reviewed.  Constitutional:      Appearance: Normal appearance.  Cardiovascular:     Rate and Rhythm: Normal rate and regular rhythm.     Heart sounds: Normal  heart sounds.  Pulmonary:     Effort: Pulmonary effort is normal.     Breath sounds: Normal breath sounds.  Abdominal:     General: There is distension.     Palpations: Abdomen is soft.  Musculoskeletal:  General: No swelling.  Skin:    General: Skin is warm.  Neurological:     General: No focal deficit present.     Mental Status: She is alert and oriented to person, place, and time.  Psychiatric:        Mood and Affect: Mood normal.        Behavior: Behavior normal.      LABORATORY DATA:  I have reviewed the labs as listed.  CBC    Component Value Date/Time   WBC 8.7 12/26/2018 1451   RBC 5.50 (H) 12/26/2018 1451   HGB 16.6 (H) 12/26/2018 1451   HCT 53.6 (H) 12/26/2018 1451   PLT 221 12/26/2018 1451   MCV 97.5 12/26/2018 1451   MCH 30.2 12/26/2018 1451   MCHC 31.0 12/26/2018 1451   RDW 13.2 12/26/2018 1451   LYMPHSABS 3.5 12/26/2018 1451   MONOABS 0.6 12/26/2018 1451   EOSABS 0.2 12/26/2018 1451   BASOSABS 0.1 12/26/2018 1451   CMP Latest Ref Rng & Units 12/26/2018 09/05/2018 06/14/2018  Glucose 70 - 99 mg/dL 214(H) 173(H) 158(H)  BUN 8 - 23 mg/dL 19 14 15   Creatinine 0.44 - 1.00 mg/dL 0.69 0.68 0.67  Sodium 135 - 145 mmol/L 137 139 140  Potassium 3.5 - 5.1 mmol/L 3.7 4.6 4.2  Chloride 98 - 111 mmol/L 100 101 101  CO2 22 - 32 mmol/L 24 24 26   Calcium 8.9 - 10.3 mg/dL 9.8 10.3 10.1  Total Protein 6.5 - 8.1 g/dL 7.6 7.3 7.8  Total Bilirubin 0.3 - 1.2 mg/dL 0.5 0.6 0.4  Alkaline Phos 38 - 126 U/L 47 - 38  AST 15 - 41 U/L 39 48(H) 54(H)  ALT 0 - 44 U/L 46(H) 62(H) 63(H)       DIAGNOSTIC IMAGING:  I have independently reviewed the scans and discussed with the patient.   I have reviewed Venita Lick LPN's note and agree with the documentation.  I personally performed a face-to-face visit, made revisions and my assessment and plan is as follows.    ASSESSMENT & PLAN:   Hodgkin's disease, nodular sclerosis, of lymph nodes of multiple sites ([place]) 1.   Hodgkin's Lymphoma, nodular-sclerosing type involving multiple lymph nodes:  - Left axilla pathology:CD30, CD15, & PAX-5 positive  - ABVD chemotherapy beginning on 09/13/15, with discontinuation of Bleomycin on 10/11/15.  - Mid-course PET scan revealed Deauville 3 lymphadenopathy, so she received 4 additional cycles of AVD.   - Treatment course was complicated by diastolic heart failure  - Chemotherapy completed on 02/23/16. - Post-treatment PET revealed mild residual (L) axillary adenopathy (Deauville 2), so she was referred for axillary radiation therapy.  - Completed XRT to left axilla on 05/23/16.  - Repeat PET 6/18: Mild residual low level metabolic activity within left axillary lymph nodes a similar to prior (Deauville 2). - She has continued with clinical surveillance with scans q 36mths.  - Most recent CT CAP 06/18/2018 reveals stable bilateral axilla lymph nodes.  -She denies any fevers, night sweats or weight loss in the last 6 months.  No recurrent infections noted.  Physical exam today did not reveal any lymphadenopathy.  We reviewed her labs. -We will see her back in 6 months for follow-up with repeat CT CAP and labs.  2. HIV: - Continue follow up with Infectious Disease  3. Chemo induced heart failure - Continue follow up with Cardiology.      Orders placed this encounter:  Orders Placed This Encounter  Procedures   CT  Chest W Contrast   CT Abdomen Pelvis W Contrast   CBC with Differential/Platelet   Comprehensive metabolic panel   Lactate dehydrogenase   Sedimentation rate      Derek Jack, MD [place] [phone]

## 2019-01-09 NOTE — Progress Notes (Signed)
Subjective: Holly Stephens is seen today for follow up painful, elongated, thickened toenails 1-5 b/l feet that she cannot cut. Pain interferes with daily activities. Aggravating factor includes wearing enclosed shoe gear and relieved with periodic debridement.  Current Outpatient Medications on File Prior to Visit  Medication Sig  . acetaminophen (TYLENOL) 650 MG CR tablet Take 1,300 mg by mouth every 8 (eight) hours as needed for pain.  . cholecalciferol (VITAMIN D) 1000 units tablet Take 1,000 Units by mouth daily.   . dolutegravir (TIVICAY) 50 MG tablet Take 1 tablet (50 mg total) by mouth daily. Take with Odefsey.  Marland Kitchen emtricitabine-rilpivir-tenofovir AF (ODEFSEY) 200-25-25 MG TABS tablet Take 1 tablet by mouth daily with breakfast. Take with Tivicay.  . fenofibrate 160 MG tablet Take 160 mg by mouth daily.   . fexofenadine (ALLEGRA) 180 MG tablet Take 180 mg by mouth daily as needed for allergies.   . fluticasone (FLONASE) 50 MCG/ACT nasal spray Place 1 spray into both nostrils daily as needed for allergies.   . furosemide (LASIX) 20 MG tablet Take 1 tablet (20 mg total) by mouth daily.  Marland Kitchen GLUCOSAMINE-CHONDROITIN PO Take 3 tablets by mouth daily.   Marland Kitchen JARDIANCE 25 MG TABS tablet Take 25 mg by mouth daily.   . metFORMIN (GLUCOPHAGE-XR) 500 MG 24 hr tablet Take 1,000 mg by mouth 2 (two) times daily as needed.  . methocarbamol (ROBAXIN) 750 MG tablet Take 750 mg by mouth as needed.   . metoprolol tartrate (LOPRESSOR) 25 MG tablet Take 1 tablet (25 mg total) by mouth daily.  . Multiple Vitamin (MULTIVITAMIN WITH MINERALS) TABS tablet Take 1 tablet by mouth daily.  Marland Kitchen PROAIR HFA 108 (90 Base) MCG/ACT inhaler INL 2 PFS PO Q 4 TO 6 H PRF WHZ  . REPATHA 140 MG/ML SOSY INJECT 1 SYRINGE INTO THE SKIN Q 2 WEEKS  . spironolactone (ALDACTONE) 25 MG tablet TAKE 1 TABLET DAILY (Patient taking differently: Take 25 mg by mouth daily. )  . vitamin E 400 UNIT capsule Take 400 Units by mouth daily.   No  current facility-administered medications on file prior to visit.      Allergies  Allergen Reactions  . Augmentin [Amoxicillin-Pot Clavulanate] Hives and Other (See Comments)    Has patient had a PCN reaction causing immediate rash, facial/tongue/throat swelling, SOB or lightheadedness with hypotension: No Has patient had a PCN reaction causing severe rash involving mucus membranes or skin necrosis: No Has patient had a PCN reaction that required hospitalization No Has patient had a PCN reaction occurring within the last 10 years: No If all of the above answers are "NO", then may proceed with Cephalosporin use.  . Bleomycin Cough  . Cefpodoxime Proxetil Itching and Rash     Objective:  Vascular Examination: Capillary refill time <3 seconds x 10 digits.  Dorsalis pedis present b/l.  Posterior tibial pulses present b/l.  Sparse digital hair b/l.  Skin temperature gradient WNL b/l.   Dermatological Examination: Skin with normal turgor, texture and tone b/l.  Toenails 1-5 b/l discolored, thick, dystrophic with subungual debris and pain with palpation to nailbeds due to thickness of nails.  Musculoskeletal: Muscle strength 5/5 to all LE muscle groups  No gross bony deformities b/l.  No pain, crepitus or joint limitation noted with ROM.   Neurological Examination: Protective sensation intact 5/5 b/l with 10 gram monofilament.  Epicritic sensation present bilaterally.  Vibratory sensation intact bilaterally.   Assessment: Painful onychomycosis toenails 1-5 b/l   Plan: 1.  Toenails 1-5 b/l were debrided in length and girth without iatrogenic bleeding. 2. Patient to continue soft, supportive shoe gear. 3. Patient to report any pedal injuries to medical professional immediately. 4. Follow up 3 months.  5. Patient/POA to call should there be a concern in the interim.

## 2019-01-31 DIAGNOSIS — E119 Type 2 diabetes mellitus without complications: Secondary | ICD-10-CM | POA: Diagnosis not present

## 2019-01-31 DIAGNOSIS — E1169 Type 2 diabetes mellitus with other specified complication: Secondary | ICD-10-CM | POA: Diagnosis not present

## 2019-01-31 DIAGNOSIS — I10 Essential (primary) hypertension: Secondary | ICD-10-CM | POA: Diagnosis not present

## 2019-01-31 DIAGNOSIS — E782 Mixed hyperlipidemia: Secondary | ICD-10-CM | POA: Diagnosis not present

## 2019-02-03 DIAGNOSIS — E119 Type 2 diabetes mellitus without complications: Secondary | ICD-10-CM | POA: Diagnosis not present

## 2019-02-07 DIAGNOSIS — R945 Abnormal results of liver function studies: Secondary | ICD-10-CM | POA: Diagnosis not present

## 2019-02-07 DIAGNOSIS — E782 Mixed hyperlipidemia: Secondary | ICD-10-CM | POA: Diagnosis not present

## 2019-02-07 DIAGNOSIS — E1169 Type 2 diabetes mellitus with other specified complication: Secondary | ICD-10-CM | POA: Diagnosis not present

## 2019-02-07 DIAGNOSIS — I10 Essential (primary) hypertension: Secondary | ICD-10-CM | POA: Diagnosis not present

## 2019-02-10 DIAGNOSIS — M25561 Pain in right knee: Secondary | ICD-10-CM | POA: Diagnosis not present

## 2019-04-04 ENCOUNTER — Ambulatory Visit: Payer: BC Managed Care – PPO | Admitting: Podiatry

## 2019-04-04 ENCOUNTER — Other Ambulatory Visit: Payer: Self-pay

## 2019-04-04 ENCOUNTER — Encounter: Payer: Self-pay | Admitting: Podiatry

## 2019-04-04 DIAGNOSIS — M79674 Pain in right toe(s): Secondary | ICD-10-CM | POA: Diagnosis not present

## 2019-04-04 DIAGNOSIS — E119 Type 2 diabetes mellitus without complications: Secondary | ICD-10-CM

## 2019-04-04 DIAGNOSIS — M79675 Pain in left toe(s): Secondary | ICD-10-CM | POA: Diagnosis not present

## 2019-04-04 DIAGNOSIS — B351 Tinea unguium: Secondary | ICD-10-CM | POA: Diagnosis not present

## 2019-04-04 NOTE — Patient Instructions (Signed)

## 2019-04-06 NOTE — Progress Notes (Signed)
Subjective: Holly Stephens presents today for follow up of preventative diabetic foot care and painful mycotic nails b/l that are difficult to trim. Pain interferes with ambulation. Aggravating factors include wearing enclosed shoe gear. Pain is relieved with periodic professional debridement.   Allergies  Allergen Reactions  . Augmentin [Amoxicillin-Pot Clavulanate] Hives and Other (See Comments)    Has patient had a PCN reaction causing immediate rash, facial/tongue/throat swelling, SOB or lightheadedness with hypotension: No Has patient had a PCN reaction causing severe rash involving mucus membranes or skin necrosis: No Has patient had a PCN reaction that required hospitalization No Has patient had a PCN reaction occurring within the last 10 years: No If all of the above answers are "NO", then may proceed with Cephalosporin use.  . Bleomycin Cough  . Cefpodoxime Proxetil Itching and Rash     Objective: There were no vitals filed for this visit.  Vascular Examination:  Capillary fill time to digits <3s b/l, palpable DP pulses b/l, palpable PT pulses b/l, pedal hair sparse b/l and skin temperature gradient within normal limits b/l  Dermatological Examination: Pedal skin with normal turgor, texture and tone bilaterally, no open wounds bilaterally, no interdigital macerations bilaterally and toenails 1-5 b/l elongated, dystrophic, thickened, crumbly with subungual debris  Musculoskeletal: Normal muscle strength 5/5 to all lower extremity muscle groups bilaterally, no gross bony deformities bilaterally and no pain crepitus or joint limitation noted with ROM b/l  Neurological: Protective sensation intact 5/5 intact bilaterally with 10g monofilament b/l and vibratory sensation intact b/l  Assessment: 1. Pain due to onychomycosis of toenails of both feet   2. Controlled type 2 diabetes mellitus without complication, without long-term current use of insulin ([place])    Plan: -Continue  diabetic foot care principles. Literature dispensed on today.  -Toenails 1-5 b/l were debrided in length and girth without iatrogenic bleeding. -Patient to continue soft, supportive shoe gear daily. -Patient to report any pedal injuries to medical professional immediately. -Patient/POA to call should there be question/concern in the interim.  Return in about 3 months (around 07/02/2019) for nail trim.

## 2019-05-08 DIAGNOSIS — Z23 Encounter for immunization: Secondary | ICD-10-CM | POA: Diagnosis not present

## 2019-06-03 ENCOUNTER — Ambulatory Visit (HOSPITAL_COMMUNITY)
Admission: RE | Admit: 2019-06-03 | Discharge: 2019-06-03 | Disposition: A | Discharge status: Home / Self Care | Payer: BC Managed Care – PPO | Source: Ambulatory Visit | Attending: Hematology | Admitting: Hematology

## 2019-06-03 ENCOUNTER — Other Ambulatory Visit: Payer: Self-pay

## 2019-06-03 ENCOUNTER — Inpatient Hospital Stay (HOSPITAL_COMMUNITY): Payer: BC Managed Care – PPO | Attending: Hematology

## 2019-06-03 DIAGNOSIS — C8114 Nodular sclerosis classical Hodgkin lymphoma, lymph nodes of axilla and upper limb: Secondary | ICD-10-CM | POA: Diagnosis not present

## 2019-06-03 DIAGNOSIS — K760 Fatty (change of) liver, not elsewhere classified: Secondary | ICD-10-CM | POA: Diagnosis not present

## 2019-06-03 DIAGNOSIS — C8190 Hodgkin lymphoma, unspecified, unspecified site: Secondary | ICD-10-CM | POA: Diagnosis not present

## 2019-06-03 DIAGNOSIS — D3502 Benign neoplasm of left adrenal gland: Secondary | ICD-10-CM | POA: Diagnosis not present

## 2019-06-03 LAB — CBC WITH DIFFERENTIAL/PLATELET
Abs Immature Granulocytes: 0.04 10*3/uL (ref 0.00–0.07)
Basophils Absolute: 0.1 10*3/uL (ref 0.0–0.1)
Basophils Relative: 1 %
Eosinophils Absolute: 0.1 10*3/uL (ref 0.0–0.5)
Eosinophils Relative: 2 %
HCT: 50.9 % — ABNORMAL HIGH (ref 36.0–46.0)
Hemoglobin: 16.1 g/dL — ABNORMAL HIGH (ref 12.0–15.0)
Immature Granulocytes: 1 %
Lymphocytes Relative: 35 %
Lymphs Abs: 2.5 10*3/uL (ref 0.7–4.0)
MCH: 30.1 pg (ref 26.0–34.0)
MCHC: 31.6 g/dL (ref 30.0–36.0)
MCV: 95.1 fL (ref 80.0–100.0)
Monocytes Absolute: 0.6 10*3/uL (ref 0.1–1.0)
Monocytes Relative: 8 %
Neutro Abs: 3.9 10*3/uL (ref 1.7–7.7)
Neutrophils Relative %: 53 %
Platelets: 251 10*3/uL (ref 150–400)
RBC: 5.35 MIL/uL — ABNORMAL HIGH (ref 3.87–5.11)
RDW: 13.3 % (ref 11.5–15.5)
WBC: 7.1 10*3/uL (ref 4.0–10.5)
nRBC: 0 % (ref 0.0–0.2)

## 2019-06-03 LAB — COMPREHENSIVE METABOLIC PANEL
ALT: 34 U/L (ref 0–44)
AST: 26 U/L (ref 15–41)
Albumin: 4.2 g/dL (ref 3.5–5.0)
Alkaline Phosphatase: 40 U/L (ref 38–126)
Anion gap: 11 (ref 5–15)
BUN: 20 mg/dL (ref 8–23)
CO2: 28 mmol/L (ref 22–32)
Calcium: 9.8 mg/dL (ref 8.9–10.3)
Chloride: 100 mmol/L (ref 98–111)
Creatinine, Ser: 0.82 mg/dL (ref 0.44–1.00)
GFR calc Af Amer: >60 mL/min (ref >60)
GFR calc non Af Amer: >60 mL/min (ref >60)
Glucose, Bld: 208 mg/dL — ABNORMAL HIGH (ref 70–99)
Potassium: 4.5 mmol/L (ref 3.5–5.1)
Sodium: 139 mmol/L (ref 135–145)
Total Bilirubin: 0.6 mg/dL (ref 0.3–1.2)
Total Protein: 7.3 g/dL (ref 6.5–8.1)

## 2019-06-03 LAB — SEDIMENTATION RATE: Sed Rate: 5 mm/hr (ref 0–22)

## 2019-06-03 LAB — LACTATE DEHYDROGENASE: LDH: 115 U/L (ref 98–192)

## 2019-06-03 MED ORDER — IOHEXOL 300 MG/ML  SOLN
100.0000 mL | Freq: Once | INTRAMUSCULAR | Status: AC | PRN
  Administered 2019-06-03: 100 mL via INTRAVENOUS

## 2019-06-06 DIAGNOSIS — Z23 Encounter for immunization: Secondary | ICD-10-CM | POA: Diagnosis not present

## 2019-06-10 ENCOUNTER — Inpatient Hospital Stay (HOSPITAL_BASED_OUTPATIENT_CLINIC_OR_DEPARTMENT_OTHER): Payer: BC Managed Care – PPO | Admitting: Nurse Practitioner

## 2019-06-10 ENCOUNTER — Other Ambulatory Visit (HOSPITAL_COMMUNITY): Payer: Self-pay | Admitting: Internal Medicine

## 2019-06-10 ENCOUNTER — Other Ambulatory Visit: Payer: Self-pay

## 2019-06-10 DIAGNOSIS — C8118 Nodular sclerosis classical Hodgkin lymphoma, lymph nodes of multiple sites: Secondary | ICD-10-CM

## 2019-06-10 DIAGNOSIS — Z1231 Encounter for screening mammogram for malignant neoplasm of breast: Secondary | ICD-10-CM

## 2019-06-10 NOTE — Assessment & Plan Note (Signed)
1. Hodgkin's Lymphoma, nodular-sclerosing type involving multiple lymph nodes: °-Left axilla pathology : CD30, CD15, and PAX-5 positive. °-ABVD chemotherapy beginning on 09/13/2015, with discontinuation of Bleomycin on 10/11/2015. °-Mid-course PET scan revealed Deauville 3 lymphadenopathy, so she received 4 additional cycles of AVD. °-Treatment course was complicated by diastolic heart failure. °-Chemotherapy completed on 02/23/2016. °-Post-treatment PET revealed mild residual Left axillary adenopathy with a Deauville 2, so she was referred for axillary radiation therapy. °-Completed XRT to left axilla on 05/23/16. °-repeat PET on 07/2016 showed mild residual low level metabolic activity within left axillary lymph nodes a similar to prior deauville 2. °-She has continued with clinical surveillance with scans yearly now. °-Most recent CT CAP on 06/05/2019 showed no lymphadenopathy or evidence of diease.   °-She denies any fever, chills, night sweats, or weight loss. No recurrent infections noted.  °-We will see her back in 6 months with labs  ° °2.HIV: °-She continues to follow up with Infectious disease. ° °3. Chemo induced heart failure: °-She continues to follow up with cardiology.  °

## 2019-06-10 NOTE — Progress Notes (Signed)
[facility] Follow up:    Holly Squibb, MD [place] [address]   DIAGNOSIS:Hodgkins lymphoma   Cancer Staging Hodgkin's disease, nodular sclerosis, of lymph nodes of multiple sites [facility]) Staging form: Lymphoid Neoplasms, AJCC 6th Edition - Clinical stage from 08/31/2015: Stage III - Signed by [facility], PA-C on 09/01/2015   SUMMARY OF ONCOLOGIC HISTORY: Oncology History  Hodgkin's disease, nodular sclerosis, of lymph nodes of multiple sites ([place])  07/23/2015 Procedure   Lext axilla lymph node biopsy   07/27/2015 Pathology Results   Classical [name], nodular sclerosis type.  Positive CD30, CD15, andPAX-5.  Negative CD45 and CD20.   08/31/2015 PET scan   Hypermetabolic lymphadenopathy. Nodal stations include: L supraclavicular nodes, L axillary nodes, mediastinal lymph nodes, periaortic retroperitoneal and periportal lymph nodes as well as L iliac and inguinal lymph nodes. Two concerning pulm nodules...   09/01/2015 Imaging   MUGA- Normal LEFT ventricular ejection fraction of 73%.  Normal LEFT ventricular wall motion.   09/01/2015 Procedure   PFTs-  Spirometry shows mild ventilatory defect without airflow obstruction and no bronchodilator improvement. Lung volumes are normal. Airway resistance is normal. DLCO normal. Essentially normal lung function   09/08/2015 Procedure   Port placed by Dr. Rosana Hoes   09/13/2015 - 02/23/2016 Chemotherapy   ABVD   09/27/2015 Imaging   CT angio of chest- no gross PE seen. Improving axillary, mediastinal and upper abdominal adenopathy. Improved and possibly resolved RUL nodule and with resolved or obscured LLL nodule. Small L pleural effusion and mild L lower lobe atelectasis.   10/11/2015 Treatment Plan Change   Bleomycin discontinued    11/01/2015 PET scan   Clear interval response to therapy with decrease in size and hypermetabolism of larger lymph nodes seen previously and some of the smaller  hypermetabolic lymph node seen on the previous study have resolved completely in terms of abnormal enlargement and hypermetabolism. 2. Slight decrease in left lower lobe pulmonary nodule with interval resolution of the right upper lobe pulmonary nodule. 3. Stable appearance of mild heterogeneous FDG accumulation within the marrow space.   11/01/2015 Miscellaneous   Deauville 3- 4 more cycles of AVD recommended according to NCCN guidelines.   02/09/2016 Imaging   [place] left leg- Positive for acute DVT in left calf, popliteal and femoral veins.   03/22/2016 PET scan   1. Mild residual metabolic activity within LEFT axillary lymph nodes ( Deauville 2 ). 2. No additional hypermetabolic adenopathy identified. 3. Normal spleen and marrow activity.   04/25/2016 - 05/23/2016 Radiation Therapy   Residual disease treated with radiation Isidore Moos): Left axilla treated with 36 Gy in 20 fractions.   07/24/2016 PET scan   PET IMPRESSION: 1. Stable exam. The mild residual low level metabolic activity within left axillary lymph nodes a similar to prior (Deauville 2). 2. No new or progressive findings. 3. Abdominal aortic atherosclerosis.    11/23/2016 Imaging   CT C/A/P: 1. Essentially stable left axillary adenopathy. No new adenopathy identified. 2.  Aortic Atherosclerosis (ICD10-I70.0). 3. Small left adrenal adenoma. 4. Midline ventral hernia contains about 630 cc of omental adipose tissue.     CURRENT THERAPY: Clinical surveillence  INTERVAL HISTORY: Holly Stephens 64 y.o. female was called for a telephone visit for hodgkin's lymphoma. She reports she has been doing well since her last visit. She denies any recurrent infections. She denies any b symptoms including fevers, night sweats, chills, or unexplained weight loss. Denies any nausea,  vomiting, or diarrhea. Denies any new pains. Had not noticed any recent bleeding such as epistaxis, hematuria or hematochezia. Denies recent chest pain on  exertion, shortness of breath on minimal exertion, pre-syncopal episodes, or palpitations. Denies any numbness or tingling in hands or feet. Denies any recent fevers, infections, or recent hospitalizations. Patient reports appetite at 100% and energy level at 75%. She is eating well and maintaining her weight at this time.     Patient Active Problem List   Diagnosis Date Noted  . Pain in left knee 02/19/2018  . 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 Acute deep vein thrombosis (DVT) of popliteal vein of left lower extremity ([place]) 02/09/2016  . Unintentional weight loss 12/16/2015  . Congestive heart failure with cardiomyopathy ([place]) 10/27/2015  . Acute congestive heart failure ([place]) 10/27/2015  . Hodgkin's disease, nodular sclerosis, of lymph nodes of multiple sites ([place]) 08/23/2015  . Visual floaters 09/12/2011  . Episodic low back pain 11/15/2010  . ROTATOR CUFF INJURY, RIGHT SHOULDER 11/03/2008  . ADHESIVE CAPSULITIS OF SHOULDER 11/12/2007  . OBESITY NOS 05/17/2006  . SYNDROME, CARPAL TUNNEL 05/17/2006  . FOOT PAIN 05/17/2006  . Human immunodeficiency virus (HIV) disease ([place]) 02/23/2006  . Hyperlipidemia 02/23/2006  . DEPRESSION 02/23/2006  . Essential hypertension 02/23/2006    is allergic to augmentin [amoxicillin-pot clavulanate]; bleomycin; and cefpodoxime proxetil.  MEDICAL HISTORY: Past Medical History:  Diagnosis Date  . Acute deep vein thrombosis (DVT) of popliteal vein of left lower extremity ([place]) 02/09/2016  . Arthritis    osteoarthritis  . Diabetes mellitus without complication ([place])   . History of radiation therapy 04/25/2016 - 05/23/2016   Left Axilla treated to 36 Gy in 20 fractions  . HIV infection ([place])   . [name], nodular sclerosis ([place]) 08/23/2015  . Human immunodeficiency virus (HIV) disease ([place]) 02/23/2006   Qualifier: Diagnosis of  By: Megan Salon MD, John      SURGICAL HISTORY: Past Surgical History:   Procedure Laterality Date  . AXILLARY LYMPH NODE BIOPSY Left 07/23/2015   Procedure: EXCISIONAL BIOPSY LEFT AXILLARY LYMPH NODE;  Surgeon: [name], MD;  Location: [facility];  Service: General;  Laterality: Left;  . PORT-A-CATH REMOVAL N/A 01/09/2018   Procedure: MINOR REMOVAL PORT-A-CATH;  Surgeon: Aviva Signs, MD;  Location: [facility];  Service: General;  Laterality: N/A;  . PORTACATH PLACEMENT Right 09/08/2015   Procedure: INSERTION OF CENTRAL VENOUS CATHETER WITH PORT FOR CHEMOTHERAPY;  Surgeon: [name], MD;  Location: [facility];  Service: General;  Laterality: Right;  . SHOULDER SURGERY Right    removal of bone spur    SOCIAL HISTORY: Social History   Socioeconomic History  . Marital status: Widowed    Spouse name: Not on file  . Number of children: Not on file  . Years of education: Not on file  . Highest education level: Not on file  Occupational History  . Not on file  Tobacco Use  . Smoking status: Never Smoker  . Smokeless tobacco: Never Used  Substance and Sexual Activity  . Alcohol use: No  . Drug use: No  . Sexual activity: Never    Comment: declined condoms  Other Topics Concern  . Not on file  Social History Narrative  . Not on file   Social Determinants of Health   Financial Resource Strain:   . Difficulty of Paying Living Expenses:   Food Insecurity:   . Worried About Charity fundraiser in  the Last Year:   . [place] in the Last Year:   Transportation Needs:   . Film/video editor (Medical):   Marland Kitchen Lack of Transportation (Non-Medical):   Physical Activity:   . Days of Exercise per Week:   . Minutes of Exercise per Session:   Stress:   . Feeling of Stress :   Social Connections:   . Frequency of Communication with Friends and Family:   . Frequency of Social Gatherings with Friends and Family:   . Attends Religious Services:   . Active Member of Clubs or Organizations:   . Attends Archivist Meetings:   Marland Kitchen Marital  Status:   Intimate Partner Violence:   . Fear of Current or Ex-Partner:   . Emotionally Abused:   Marland Kitchen Physically Abused:   . Sexually Abused:     FAMILY HISTORY: Family History  Problem Relation Age of Onset  . Hypertension Mother   . Cancer Mother 38       breast   . Hypertension Father   . Heart attack Father   . Pulmonary embolism Sister   . Parkinson's disease Brother     Review of Systems  All other systems reviewed and are negative.   Vital Signs: -deferred due to tele visit  Physical Exam -deferred due to tele visit. -patient was alert and oriented over the phone and in no acute distress.   LABORATORY DATA:  CBC    Component Value Date/Time   WBC 7.1 06/03/2019 0854   RBC 5.35 (H) 06/03/2019 0854   HGB 16.1 (H) 06/03/2019 0854   HCT 50.9 (H) 06/03/2019 0854   PLT 251 06/03/2019 0854   MCV 95.1 06/03/2019 0854   MCH 30.1 06/03/2019 0854   MCHC 31.6 06/03/2019 0854   RDW 13.3 06/03/2019 0854   LYMPHSABS 2.5 06/03/2019 0854   MONOABS 0.6 06/03/2019 0854   EOSABS 0.1 06/03/2019 0854   BASOSABS 0.1 06/03/2019 0854    CMP     Component Value Date/Time   NA 139 06/03/2019 0854   K 4.5 06/03/2019 0854   CL 100 06/03/2019 0854   CO2 28 06/03/2019 0854   GLUCOSE 208 (H) 06/03/2019 0854   BUN 20 06/03/2019 0854   CREATININE 0.82 06/03/2019 0854   CREATININE 0.68 09/05/2018 0901   CALCIUM 9.8 06/03/2019 0854   PROT 7.3 06/03/2019 0854   ALBUMIN 4.2 06/03/2019 0854   AST 26 06/03/2019 0854   ALT 34 06/03/2019 0854   ALKPHOS 40 06/03/2019 0854   BILITOT 0.6 06/03/2019 0854   GFRNONAA >60 06/03/2019 0854   GFRNONAA >60 10/31/2010 0849   GFRAA >60 06/03/2019 0854   GFRAA >60 10/31/2010 0849    All questions were answered to patient's stated satisfaction. Encouraged patient to call with any new concerns or questions before his next visit to the cancer center and we can certain see him sooner, if needed.      ASSESSMENT and THERAPY PLAN:   Hodgkin's  disease, nodular sclerosis, of lymph nodes of multiple sites ([place]) 1. Hodgkin's Lymphoma, nodular-sclerosing type involving multiple lymph nodes: -Left axilla pathology : CD30, CD15, and PAX-5 positive. -ABVD chemotherapy beginning on 09/13/2015, with discontinuation of Bleomycin on 10/11/2015. -Mid-course PET scan revealed Deauville 3 lymphadenopathy, so she received 4 additional cycles of AVD. -Treatment course was complicated by diastolic heart failure. -Chemotherapy completed on 02/23/2016. -Post-treatment PET revealed mild residual Left axillary adenopathy with a Deauville 2, so she was referred for axillary radiation therapy. -Completed  XRT to left axilla on 05/23/16. -repeat PET on 07/2016 showed mild residual low level metabolic activity within left axillary lymph nodes a similar to prior deauville 2. -She has continued with clinical surveillance with scans yearly now. -Most recent CT CAP on 06/05/2019 showed no lymphadenopathy or evidence of diease.   -She denies any fever, chills, night sweats, or weight loss. No recurrent infections noted.  -We will see her back in 6 months with labs   2.HIV: -She continues to follow up with Infectious disease.  3. Chemo induced heart failure: -She continues to follow up with cardiology.    Orders Placed This Encounter  Procedures  . Lactate dehydrogenase    Standing Status:   Future    Standing Expiration Date:   06/09/2020  . CBC with Differential/Platelet    Standing Status:   Future    Standing Expiration Date:   06/09/2020  . Comprehensive metabolic panel    Standing Status:   Future    Standing Expiration Date:   06/09/2020  . VITAMIN D 25 Hydroxy (Vit-D Deficiency, Fractures)    Standing Status:   Future    Standing Expiration Date:   06/09/2020  . Vitamin B12    Standing Status:   Future    Standing Expiration Date:   06/09/2020  . Sedimentation rate    Standing Status:   Future    Standing Expiration Date:   06/09/2020    All  questions were answered. The patient knows to call the clinic with any problems, questions or concerns. We can certainly see the patient much sooner if necessary. This note was electronically signed.  I provided 30 minutes of non face-to-face telephone visit time during this encounter, and > 50% was spent counseling as documented under my assessment & plan.   Glennie [place], NP-C 06/10/2019

## 2019-06-20 DIAGNOSIS — D751 Secondary polycythemia: Secondary | ICD-10-CM | POA: Diagnosis not present

## 2019-06-20 DIAGNOSIS — E119 Type 2 diabetes mellitus without complications: Secondary | ICD-10-CM | POA: Diagnosis not present

## 2019-06-20 DIAGNOSIS — E782 Mixed hyperlipidemia: Secondary | ICD-10-CM | POA: Diagnosis not present

## 2019-06-20 DIAGNOSIS — C8190 Hodgkin lymphoma, unspecified, unspecified site: Secondary | ICD-10-CM | POA: Diagnosis not present

## 2019-06-20 DIAGNOSIS — E1169 Type 2 diabetes mellitus with other specified complication: Secondary | ICD-10-CM | POA: Diagnosis not present

## 2019-06-20 DIAGNOSIS — B20 Human immunodeficiency virus [HIV] disease: Secondary | ICD-10-CM | POA: Diagnosis not present

## 2019-06-27 DIAGNOSIS — R945 Abnormal results of liver function studies: Secondary | ICD-10-CM | POA: Diagnosis not present

## 2019-06-27 DIAGNOSIS — E782 Mixed hyperlipidemia: Secondary | ICD-10-CM | POA: Diagnosis not present

## 2019-06-27 DIAGNOSIS — I10 Essential (primary) hypertension: Secondary | ICD-10-CM | POA: Diagnosis not present

## 2019-06-27 DIAGNOSIS — E1169 Type 2 diabetes mellitus with other specified complication: Secondary | ICD-10-CM | POA: Diagnosis not present

## 2019-06-30 ENCOUNTER — Telehealth: Payer: Self-pay

## 2019-06-30 NOTE — Telephone Encounter (Signed)
Received call from pharmacy stating that patient updated her medication list and metformin has been added. Pharmacist called due to possible drug interaction between metformin and dolutegravir. Will forward to provider.  Adaora Mchaney Lorita Officer, RN

## 2019-07-01 ENCOUNTER — Telehealth: Payer: Self-pay | Admitting: Pharmacist

## 2019-07-01 NOTE — Telephone Encounter (Signed)
Agree with Ivy's note. Patient will contact her doctor and alert him of the drug interaction and the max dose of metformin she can safely take of 1000 mg per day.

## 2019-07-01 NOTE — Telephone Encounter (Signed)
Received follow up call on patient's medication and drug- drug interaction. Pharmacy would like to know if patient can continue both medication or if changes will be made.  P: [id number] Patient TN:[phone] Aundria Rud, [place]

## 2019-07-01 NOTE — Telephone Encounter (Signed)
We called the patient and advised her to only take 1000 mg of the metformin per day (the max dose with Tivicay). She will continue both and alert her doctor.

## 2019-07-01 NOTE — Telephone Encounter (Cosign Needed)
Patient's pharmacy contacted [facility] regarding a drug interaction with Metformin and Tivicay. Patient confirmed she is currently taking Metformin 1500 mg daily. Instructed patient to start taking 1000 mg daily, as this is the recommended max dose of Metformin while on Tivicay. Also instructed patient to inform her PCP, Dr. Nevada Crane of the medication changes. Patient verbalized understanding.   Lorel Monaco, PharmD PGY1 Ambulatory Care Resident

## 2019-07-04 ENCOUNTER — Encounter: Payer: Self-pay | Admitting: Podiatry

## 2019-07-04 ENCOUNTER — Ambulatory Visit: Payer: BC Managed Care – PPO | Admitting: Podiatry

## 2019-07-04 ENCOUNTER — Other Ambulatory Visit: Payer: Self-pay

## 2019-07-04 VITALS — Temp 97.9°F

## 2019-07-04 DIAGNOSIS — B351 Tinea unguium: Secondary | ICD-10-CM | POA: Diagnosis not present

## 2019-07-04 DIAGNOSIS — B353 Tinea pedis: Secondary | ICD-10-CM | POA: Diagnosis not present

## 2019-07-04 DIAGNOSIS — M2141 Flat foot [pes planus] (acquired), right foot: Secondary | ICD-10-CM

## 2019-07-04 DIAGNOSIS — E119 Type 2 diabetes mellitus without complications: Secondary | ICD-10-CM

## 2019-07-04 DIAGNOSIS — M2142 Flat foot [pes planus] (acquired), left foot: Secondary | ICD-10-CM

## 2019-07-04 DIAGNOSIS — M79675 Pain in left toe(s): Secondary | ICD-10-CM | POA: Diagnosis not present

## 2019-07-04 DIAGNOSIS — M79674 Pain in right toe(s): Secondary | ICD-10-CM | POA: Diagnosis not present

## 2019-07-04 MED ORDER — CLOTRIMAZOLE-BETAMETHASONE 1-0.05 % EX CREA: TOPICAL_CREAM | CUTANEOUS | 1 refills | Status: DC

## 2019-07-04 NOTE — Patient Instructions (Addendum)
Athlete's Foot  Athlete's foot (tinea pedis) is a fungal infection of the skin on your feet. It often occurs on the skin that is between or underneath the toes. It can also occur on the soles of your feet. The infection can spread from person to person (is contagious). It can also spread when a person's bare feet come in contact with the fungus on shower floors or on items such as shoes. What are the causes? This condition is caused by a fungus that grows in warm, moist places. You can get athlete's foot by sharing shoes, shower stalls, towels, and wet floors with someone who is infected. Not washing your feet or changing your socks often enough can also lead to athlete's foot. What increases the risk? This condition is more likely to develop in:  Men.  People who have a weak body defense system (immune system).  People who have diabetes.  People who use public showers, such as at a gym.  People who wear heavy-duty shoes, such as industrial or military shoes.  Seasons with warm, humid weather. What are the signs or symptoms? Symptoms of this condition include:  Itchy areas between your toes or on the soles of your feet.  White, flaky, or scaly areas between your toes or on the soles of your feet.  Very itchy small blisters between your toes or on the soles of your feet.  Small cuts in your skin. These cuts can become infected.  Thick or discolored toenails. How is this diagnosed? This condition may be diagnosed with a physical exam and a review of your medical history. Your health care provider may also take a skin or toenail sample to examine under a microscope. How is this treated? This condition is treated with antifungal medicines. These may be applied as powders, ointments, or creams. In severe cases, an oral antifungal medicine may be given. Follow these instructions at home: Medicines  Apply or take over-the-counter and prescription medicines only as told by your health  care provider.  Apply your antifungal medicine as told by your health care provider. Do not stop using the antifungal even if your condition improves. Foot care  Do not scratch your feet.  Keep your feet dry: ? Wear cotton or wool socks. Change your socks every day or if they become wet. ? Wear shoes that allow air to flow, such as sandals or canvas tennis shoes.  Wash and dry your feet, including the area between your toes. Also, wash and dry your feet: ? Every day or as told by your health care provider. ? After exercising. General instructions  Do not let others use towels, shoes, nail clippers, or other personal items that touch your feet.  Protect your feet by wearing sandals in wet areas, such as locker rooms and shared showers.  Keep all follow-up visits as told by your health care provider. This is important.  If you have diabetes, keep your blood sugar under control. Contact a health care provider if:  You have a fever.  You have swelling, soreness, warmth, or redness in your foot.  Your feet are not getting better with treatment.  Your symptoms get worse.  You have new symptoms. Summary  Athlete's foot (tinea pedis) is a fungal infection of the skin on your feet. It often occurs on skin that is between or underneath the toes.  This condition is caused by a fungus that grows in warm, moist places.  Symptoms include white, flaky, or scaly areas between   your toes or on the soles of your feet.  This condition is treated with antifungal medicines.  Keep your feet clean. Always dry them thoroughly. This information is not intended to replace advice given to you by your health care provider. Make sure you discuss any questions you have with your health care provider. Document Revised: 02/01/2017 Document Reviewed: 11/27/2016 Elsevier Patient Education  [place], Adult  Normally, a foot has a curve, called an arch, on its inner side. The  arch creates a gap between the foot and the ground. Flat feet is a common condition in which one or both feet do not have an arch. What are the causes? This condition may be caused by:  Failure of a normal arch to develop during childhood.  An injury to tendons and ligaments in the foot, such as to the tendon that supports the arch (posterior tibial tendon).  Loose tendons or ligaments in the foot.  A wearing down of the arch over time.  Injury to bones in the foot.  An abnormality in the bones of the foot, called tarsal coalition. This happens when two or more bones in the foot are joined together (fused) before birth. What increases the risk? This condition is more likely to develop in:  Females.  Adults age 4 or older.  People who: ? Have a family history of flat feet. ? Have a history of childhood flexible flatfoot. ? Are obese. ? Have diabetes. ? Have high blood pressure. ? Participate in high-impact sports. ? Have inflammatory arthritis. ? Have a history of broken (fractured) or dislocated bones in the foot. What are the signs or symptoms? Symptoms of this condition include:  Pain or tightness along the bottom of the foot.  Foot pain that gets worse with activity.  Swelling of the inner side of the foot.  Swelling of the ankle.  Pain on the outer side of the ankle.  Changes in the way that you walk (gait).  Pronation. This is when the foot and ankle lean inward when you are standing.  Bony bumps on the top or inner side of the foot. How is this diagnosed? This condition is diagnosed with a physical exam of your foot and ankle. Your health care provider may also:  Look at your shoes for patterns of wear on the soles.  Order imaging tests, such as X-rays, a CT scan, or an MRI.  Refer you to a health care provider who specializes in feet (podiatrist) or a physical therapist. How is this treated? This condition may be treated with:  Stretching exercises  or physical therapy. This helps to increase range of motion and relieve pain.  A shoe insert (orthotic). This helps to support the arch of your foot. Orthotics can be purchased from a store or can be custom-made by your health care provider.  Wearing shoes with appropriate arch support. This is especially important for athletes.  Medicines. These may be prescribed to relieve pain.  An ankle brace, boot, or cast. These may be used to relieve pressure on your foot. You may be given crutches if walking is painful.  Surgery. This may be done to improve the alignment of your foot. This is only needed if your posterior tibial tendon is torn or if you have tarsal coalition. Follow these instructions at home: Activity  Do any exercises as told by your health care provider.  If an activity causes pain, avoid it or try to find another  activity that does not cause pain. General instructions  Wear orthotics and appropriate shoes as told by your health care provider.  Take over-the-counter and prescription medicines only as told by your health care provider.  Wear an ankle brace, boot, or cast as told by your health care provider.  Use crutches as told by your health care provider.  Keep all follow-up visits as told by your health care provider. This is important. How is this prevented? To prevent the condition from getting worse:  Wear comfortable, supportive shoes that are appropriate for your activities.  Maintain a healthy weight.  Stay active in a way that your health care provider recommends. This will help to keep your feet flexible and strong.  Manage long-term (chronic) health conditions, such as diabetes, high blood pressure, and inflammatory arthritis.  Work with a health care provider if you have concerns about your feet or shoes. Contact a health care provider if:  You have pain in your foot or lower leg that gets worse or does not improve with medicine.  You have pain or  difficulty when walking.  You have problems with your orthotics. Summary  Flat feet is a common condition in which one or both feet do not have a curve, called an arch, on the inner side.  Your health care provider may recommend a shoe insert (orthotic) or shoes with the appropriate arch support.  Other treatments may include stretching exercises or physical therapy, medicines to relieve pain, and wearing an ankle brace, boot, or cast.  Surgery may be done if you have a tear in the tendon that supports your arch (posterior tibial tendon) or if two or more of your foot bones were joined together (fused)  before birth (tarsal coalition). This information is not intended to replace advice given to you by your health care provider. Make sure you discuss any questions you have with your health care provider. Document Revised: 05/30/2018 Document Reviewed: 04/19/2016 Elsevier Patient Education  [place].  Diabetes Mellitus and [place] care is an important part of your health, especially when you have diabetes. Diabetes may cause you to have problems because of poor blood flow (circulation) to your feet and legs, which can cause your skin to:  Become thinner and drier.  Break more easily.  Heal more slowly.  Peel and crack. You may also have nerve damage (neuropathy) in your legs and feet, causing decreased feeling in them. This means that you may not notice minor injuries to your feet that could lead to more serious problems. Noticing and addressing any potential problems early is the best way to prevent future foot problems. How to care for your feet Foot hygiene  Wash your feet daily with warm water and mild soap. Do not use hot water. Then, pat your feet and the areas between your toes until they are completely dry. Do not soak your feet as this can dry your skin.  Trim your toenails straight across. Do not dig under them or around the cuticle. File the edges of your  nails with an emery board or nail file.  Apply a moisturizing lotion or petroleum jelly to the skin on your feet and to dry, brittle toenails. Use lotion that does not contain alcohol and is unscented. Do not apply lotion between your toes. Shoes and socks  Wear clean socks or stockings every day. Make sure they are not too tight. Do not wear knee-high stockings since they may decrease blood flow to  your legs.  Wear shoes that fit properly and have enough cushioning. Always look in your shoes before you put them on to be sure there are no objects inside.  To break in new shoes, wear them for just a few hours a day. This prevents injuries on your feet. Wounds, scrapes, corns, and calluses  Check your feet daily for blisters, cuts, bruises, sores, and redness. If you cannot see the bottom of your feet, use a mirror or ask someone for help.  Do not cut corns or calluses or try to remove them with medicine.  If you find a minor scrape, cut, or break in the skin on your feet, keep it and the skin around it clean and dry. You may clean these areas with mild soap and water. Do not clean the area with peroxide, alcohol, or iodine.  If you have a wound, scrape, corn, or callus on your foot, look at it several times a day to make sure it is healing and not infected. Check for: ? Redness, swelling, or pain. ? Fluid or blood. ? Warmth. ? Pus or a bad smell. General instructions  Do not cross your legs. This may decrease blood flow to your feet.  Do not use heating pads or hot water bottles on your feet. They may burn your skin. If you have lost feeling in your feet or legs, you may not know this is happening until it is too late.  Protect your feet from hot and cold by wearing shoes, such as at the beach or on hot pavement.  Schedule a complete foot exam at least once a year (annually) or more often if you have foot problems. If you have foot problems, report any cuts, sores, or bruises to your  health care provider immediately. Contact a health care provider if:  You have a medical condition that increases your risk of infection and you have any cuts, sores, or bruises on your feet.  You have an injury that is not healing.  You have redness on your legs or feet.  You feel burning or tingling in your legs or feet.  You have pain or cramps in your legs and feet.  Your legs or feet are numb.  Your feet always feel cold.  You have pain around a toenail. Get help right away if:  You have a wound, scrape, corn, or callus on your foot and: ? You have pain, swelling, or redness that gets worse. ? You have fluid or blood coming from the wound, scrape, corn, or callus. ? Your wound, scrape, corn, or callus feels warm to the touch. ? You have pus or a bad smell coming from the wound, scrape, corn, or callus. ? You have a fever. ? You have a red line going up your leg. Summary  Check your feet every day for cuts, sores, red spots, swelling, and blisters.  Moisturize feet and legs daily.  Wear shoes that fit properly and have enough cushioning.  If you have foot problems, report any cuts, sores, or bruises to your health care provider immediately.  Schedule a complete foot exam at least once a year (annually) or more often if you have foot problems. This information is not intended to replace advice given to you by your health care provider. Make sure you discuss any questions you have with your health care provider. Document Revised: 10/30/2018 Document Reviewed: 03/10/2016 Elsevier Patient Education  [place].

## 2019-07-12 NOTE — Progress Notes (Signed)
Subjective: Holly Stephens presents today for painful mycotic nails b/l that are difficult to trim. Pain interferes with ambulation. Aggravating factors include wearing enclosed shoe gear. Pain is relieved with periodic professional debridement.    She voices no new pedal problems on today's visit.  Past Medical History:  Diagnosis Date  . Acute deep vein thrombosis (DVT) of popliteal vein of left lower extremity ([place]) 02/09/2016  . Arthritis    osteoarthritis  . Diabetes mellitus without complication ([place])   . History of radiation therapy 04/25/2016 - 05/23/2016   Left Axilla treated to 36 Gy in 20 fractions  . HIV infection ([place])   . 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 Human immunodeficiency virus (HIV) disease ([place]) 02/23/2006   Qualifier: Diagnosis of  By: Megan Salon MD, John      Patient Active Problem List   Diagnosis Date Noted  . Pain in left knee 02/19/2018  . 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 Acute deep vein thrombosis (DVT) of popliteal vein of left lower extremity ([place]) 02/09/2016  . Unintentional weight loss 12/16/2015  . Congestive heart failure with cardiomyopathy ([place]) 10/27/2015  . Acute congestive heart failure ([place]) 10/27/2015  . Hodgkin's disease, nodular sclerosis, of lymph nodes of multiple sites ([place]) 08/23/2015  . Visual floaters 09/12/2011  . Episodic low back pain 11/15/2010  . ROTATOR CUFF INJURY, RIGHT SHOULDER 11/03/2008  . ADHESIVE CAPSULITIS OF SHOULDER 11/12/2007  . OBESITY NOS 05/17/2006  . SYNDROME, CARPAL TUNNEL 05/17/2006  . FOOT PAIN 05/17/2006  . Human immunodeficiency virus (HIV) disease ([place]) 02/23/2006  . Hyperlipidemia 02/23/2006  . DEPRESSION 02/23/2006  . Essential hypertension 02/23/2006    Past Surgical History:  Procedure Laterality Date  . AXILLARY LYMPH NODE BIOPSY Left 07/23/2015   Procedure: EXCISIONAL BIOPSY LEFT AXILLARY LYMPH NODE;  Surgeon: [name],  MD;  Location: [facility];  Service: General;  Laterality: Left;  . PORT-A-CATH REMOVAL N/A 01/09/2018   Procedure: MINOR REMOVAL PORT-A-CATH;  Surgeon: Aviva Signs, MD;  Location: [facility];  Service: General;  Laterality: N/A;  . PORTACATH PLACEMENT Right 09/08/2015   Procedure: INSERTION OF CENTRAL VENOUS CATHETER WITH PORT FOR CHEMOTHERAPY;  Surgeon: [name], MD;  Location: [facility];  Service: General;  Laterality: Right;  . SHOULDER SURGERY Right    removal of bone spur    Current Outpatient Medications on File Prior to Visit  Medication Sig Dispense Refill  . acetaminophen (TYLENOL) 650 MG CR tablet Take 1,300 mg by mouth every 8 (eight) hours as needed for pain.    . cholecalciferol (VITAMIN D) 1000 units tablet Take 1,000 Units by mouth daily.     . dolutegravir (TIVICAY) 50 MG tablet Take 1 tablet (50 mg total) by mouth daily. Take with Odefsey. 90 tablet 3  . emtricitabine-rilpivir-tenofovir AF (ODEFSEY) 200-25-25 MG TABS tablet Take 1 tablet by mouth daily with breakfast. Take with Tivicay. 90 tablet 3  . fenofibrate 160 MG tablet Take 160 mg by mouth daily.     . fexofenadine (ALLEGRA) 180 MG tablet Take 180 mg by mouth daily as needed for allergies.     . furosemide (LASIX) 20 MG tablet Take 1 tablet (20 mg total) by mouth daily. 90 tablet 2  . GLUCOSAMINE-CHONDROITIN PO Take 2 tablets by mouth daily.     Marland Kitchen ibuprofen (ADVIL) 800 MG tablet TAKE 1 TABLET BY MOUTH THREE TIMES DAILY FOR 3 DAYS THEN TAKE AS NEEDED    .  JARDIANCE 25 MG TABS tablet Take 25 mg by mouth daily.     . metFORMIN (GLUCOPHAGE-XR) 500 MG 24 hr tablet Take 500 mg by mouth 2 (two) times daily with a meal.     . methocarbamol (ROBAXIN) 750 MG tablet Take 750 mg by mouth as needed.     . metoprolol tartrate (LOPRESSOR) 25 MG tablet Take 1 tablet (25 mg total) by mouth daily. 30 tablet 0  . Multiple Vitamin (MULTIVITAMIN WITH MINERALS) TABS tablet Take 1 tablet by mouth daily.    Marland Kitchen REPATHA 140 MG/ML SOSY INJECT 1  SYRINGE INTO THE SKIN Q 2 WEEKS    . spironolactone (ALDACTONE) 25 MG tablet TAKE 1 TABLET DAILY (Patient taking differently: Take 25 mg by mouth daily. ) 90 tablet 3  . vitamin E 400 UNIT capsule Take 400 Units by mouth daily.     No current facility-administered medications on file prior to visit.     Allergies  Allergen Reactions  . Augmentin [Amoxicillin-Pot Clavulanate] Hives and Other (See Comments)    Has patient had a PCN reaction causing immediate rash, facial/tongue/throat swelling, SOB or lightheadedness with hypotension: No Has patient had a PCN reaction causing severe rash involving mucus membranes or skin necrosis: No Has patient had a PCN reaction that required hospitalization No Has patient had a PCN reaction occurring within the last 10 years: No If all of the above answers are "NO", then may proceed with Cephalosporin use.  . Bleomycin Cough  . Cefpodoxime Proxetil Itching and Rash    Social History   Occupational History  . Not on file  Tobacco Use  . Smoking status: Never Smoker  . Smokeless tobacco: Never Used  Substance and Sexual Activity  . Alcohol use: No  . Drug use: No  . Sexual activity: Never    Comment: declined condoms    Family History  Problem Relation Age of Onset  . Hypertension Mother   . Cancer Mother 76       breast   . Hypertension Father   . Heart attack Father   . Pulmonary embolism Sister   . Parkinson's disease Brother     Immunization History  Administered Date(s) Administered  . H1N1 03/23/2008  . Hepatitis B 04/02/2001, 05/17/2001, 09/17/2001  . Influenza Split 11/15/2010, 12/05/2011  . Influenza Whole 11/15/2005, 11/16/2005, 11/20/2006, 11/12/2007, 12/03/2008, 11/29/2009  . Influenza,inj,Quad PF,6+ Mos 11/05/2012, 12/01/2015  . Influenza-Unspecified 11/19/2013, 11/09/2016  . Pneumococcal Polysaccharide-23 11/16/2005, 11/15/2010  . Tdap 11/05/2012     Objective: Vitals:   07/04/19 0907  Temp: 97.9 F (36.6 C)     Holly Stephens is a pleasant 64 y.o. female, morbidly obese,  in NAD. AAO X 3.  Vascular Examination: Capillary fill time to digits <3 seconds b/l. Palpable DP pulses b/l. Palpable PT pulses b/l. Pedal hair sparse b/l. Skin temperature gradient within normal limits b/l.  Dermatological Examination: Pedal skin with normal turgor, texture and tone bilaterally. No open wounds bilaterally. No interdigital macerations bilaterally. Toenails 1-5 b/l elongated, dystrophic, thickened, crumbly with subungual debris and tenderness to dorsal palpation. Diffuse scaling noted peripherally and plantarly b/l feet with mild foot odor.  No interdigital macerations.  No blisters, no weeping. No signs of secondary bacterial infection noted.  Musculoskeletal Examination: Normal muscle strength 5/5 to all lower extremity muscle groups bilaterally. No pain crepitus or joint limitation noted with ROM b/l. Pes planus deformity noted b/l.   Neurological Examination: Protective sensation intact 5/5 intact bilaterally with 10g monofilament b/l.  Vibratory sensation intact b/l.  Assessment: 1. Pain due to onychomycosis of toenails of both feet   2. Tinea pedis of both feet   3. Pes planus of both feet   4. Controlled type 2 diabetes mellitus without complication, without long-term current use of insulin ([place])   5. Encounter for diabetic foot exam ([place])   Plan: -Examined patient.  -Diabetic foot examination peformed. Foot deformity with diabetes -No new findings. No new orders. -Toenails 1-5 b/l were debrided in length and girth with sterile nail nippers and dremel without iatrogenic bleeding.  -For tinea pedis, Rx was written for Lotrisone Cream to be applied bid for 4 weeks. -Patient to continue soft, supportive shoe gear daily. -Patient to report any pedal injuries to medical professional immediately. -Patient/POA to call should there be question/concern in the interim.  Return in about 3 months (around  10/04/2019) for diabetic nail trim.

## 2019-07-25 ENCOUNTER — Other Ambulatory Visit: Payer: Self-pay

## 2019-07-25 ENCOUNTER — Ambulatory Visit (HOSPITAL_COMMUNITY)
Admission: RE | Admit: 2019-07-25 | Discharge: 2019-07-25 | Disposition: A | Discharge status: Home / Self Care | Payer: BC Managed Care – PPO | Source: Ambulatory Visit | Attending: Internal Medicine | Admitting: Internal Medicine

## 2019-07-25 DIAGNOSIS — Z6841 Body Mass Index (BMI) 40.0 and over, adult: Secondary | ICD-10-CM | POA: Diagnosis not present

## 2019-07-25 DIAGNOSIS — E1169 Type 2 diabetes mellitus with other specified complication: Secondary | ICD-10-CM | POA: Diagnosis not present

## 2019-07-25 DIAGNOSIS — Z1231 Encounter for screening mammogram for malignant neoplasm of breast: Secondary | ICD-10-CM

## 2019-07-30 ENCOUNTER — Other Ambulatory Visit (HOSPITAL_COMMUNITY): Payer: Self-pay | Admitting: Internal Medicine

## 2019-07-30 DIAGNOSIS — R928 Other abnormal and inconclusive findings on diagnostic imaging of breast: Secondary | ICD-10-CM

## 2019-08-06 DIAGNOSIS — M25561 Pain in right knee: Secondary | ICD-10-CM | POA: Diagnosis not present

## 2019-08-12 ENCOUNTER — Ambulatory Visit (HOSPITAL_COMMUNITY)
Admission: RE | Admit: 2019-08-12 | Discharge: 2019-08-12 | Disposition: A | Discharge status: Home / Self Care | Payer: BC Managed Care – PPO | Source: Ambulatory Visit | Attending: Internal Medicine | Admitting: Internal Medicine

## 2019-08-12 ENCOUNTER — Other Ambulatory Visit: Payer: Self-pay

## 2019-08-12 DIAGNOSIS — N6489 Other specified disorders of breast: Secondary | ICD-10-CM | POA: Diagnosis not present

## 2019-08-12 DIAGNOSIS — R928 Other abnormal and inconclusive findings on diagnostic imaging of breast: Secondary | ICD-10-CM

## 2019-08-28 ENCOUNTER — Other Ambulatory Visit: Payer: Self-pay | Admitting: Internal Medicine

## 2019-08-28 DIAGNOSIS — B20 Human immunodeficiency virus [HIV] disease: Secondary | ICD-10-CM

## 2019-08-29 ENCOUNTER — Other Ambulatory Visit: Payer: Self-pay | Admitting: Internal Medicine

## 2019-08-29 DIAGNOSIS — B20 Human immunodeficiency virus [HIV] disease: Secondary | ICD-10-CM

## 2019-08-29 MED ORDER — ODEFSEY 200-25-25 MG PO TABS: 1.0000 | ORAL_TABLET | Freq: Every day | ORAL | 3 refills | Status: DC

## 2019-09-04 ENCOUNTER — Other Ambulatory Visit: Payer: Self-pay

## 2019-09-04 ENCOUNTER — Other Ambulatory Visit: Payer: BC Managed Care – PPO

## 2019-09-04 DIAGNOSIS — B20 Human immunodeficiency virus [HIV] disease: Secondary | ICD-10-CM

## 2019-09-05 LAB — T-HELPER CELL (CD4) - (RCID CLINIC ONLY)
CD4 % Helper T Cell: 38 % (ref 33–65)
CD4 T Cell Abs: 984 /uL (ref 400–1790)

## 2019-09-06 LAB — COMPREHENSIVE METABOLIC PANEL
AG Ratio: 1.8 (calc) (ref 1.0–2.5)
ALT: 37 U/L — ABNORMAL HIGH (ref 6–29)
AST: 26 U/L (ref 10–35)
Albumin: 4.6 g/dL (ref 3.6–5.1)
Alkaline phosphatase (APISO): 33 U/L — ABNORMAL LOW (ref 37–153)
BUN: 15 mg/dL (ref 7–25)
CO2: 26 mmol/L (ref 20–32)
Calcium: 10.3 mg/dL (ref 8.6–10.4)
Chloride: 102 mmol/L (ref 98–110)
Creat: 0.69 mg/dL (ref 0.50–0.99)
Globulin: 2.5 g/dL (calc) (ref 1.9–3.7)
Glucose, Bld: 134 mg/dL — ABNORMAL HIGH (ref 65–99)
Potassium: 4.7 mmol/L (ref 3.5–5.3)
Sodium: 141 mmol/L (ref 135–146)
Total Bilirubin: 0.5 mg/dL (ref 0.2–1.2)
Total Protein: 7.1 g/dL (ref 6.1–8.1)

## 2019-09-06 LAB — CBC
HCT: 48.4 % — ABNORMAL HIGH (ref 35.0–45.0)
Hemoglobin: 16.5 g/dL — ABNORMAL HIGH (ref 11.7–15.5)
MCH: 30.8 pg (ref 27.0–33.0)
MCHC: 34.1 g/dL (ref 32.0–36.0)
MCV: 90.3 fL (ref 80.0–100.0)
MPV: 10.5 fL (ref 7.5–12.5)
Platelets: 240 10*3/uL (ref 140–400)
RBC: 5.36 10*6/uL — ABNORMAL HIGH (ref 3.80–5.10)
RDW: 12.7 % (ref 11.0–15.0)
WBC: 7.2 10*3/uL (ref 3.8–10.8)

## 2019-09-06 LAB — LIPID PANEL
Cholesterol: 177 mg/dL (ref <200)
HDL: 48 mg/dL — ABNORMAL LOW (ref >=50)
LDL Cholesterol (Calc): 98 mg/dL (calc)
Non-HDL Cholesterol (Calc): 129 mg/dL (calc) (ref <130)
Total CHOL/HDL Ratio: 3.7 (calc) (ref <5.0)
Triglycerides: 223 mg/dL — ABNORMAL HIGH (ref <150)

## 2019-09-06 LAB — HIV-1 RNA QUANT-NO REFLEX-BLD
HIV 1 RNA Quant: 55 copies/mL — ABNORMAL HIGH
HIV-1 RNA Quant, Log: 1.74 Log copies/mL — ABNORMAL HIGH

## 2019-09-06 LAB — RPR: RPR Ser Ql: NONREACTIVE

## 2019-09-17 ENCOUNTER — Ambulatory Visit: Payer: BC Managed Care – PPO | Admitting: Internal Medicine

## 2019-09-18 ENCOUNTER — Encounter: Payer: BC Managed Care – PPO | Admitting: Internal Medicine

## 2019-10-01 ENCOUNTER — Ambulatory Visit: Payer: BC Managed Care – PPO | Admitting: Internal Medicine

## 2019-10-01 ENCOUNTER — Encounter: Payer: Self-pay | Admitting: Internal Medicine

## 2019-10-01 ENCOUNTER — Other Ambulatory Visit: Payer: Self-pay

## 2019-10-01 DIAGNOSIS — B20 Human immunodeficiency virus [HIV] disease: Secondary | ICD-10-CM | POA: Diagnosis not present

## 2019-10-01 NOTE — Progress Notes (Signed)
Patient Active Problem List   Diagnosis Date Noted  . Human immunodeficiency virus (HIV) disease ([place]) 02/23/2006    Priority: High  . Pain in left knee 02/19/2018  . 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 Acute deep vein thrombosis (DVT) of popliteal vein of left lower extremity ([place]) 02/09/2016  . Unintentional weight loss 12/16/2015  . Congestive heart failure with cardiomyopathy ([place]) 10/27/2015  . Acute congestive heart failure ([place]) 10/27/2015  . Hodgkin's disease, nodular sclerosis, of lymph nodes of multiple sites ([place]) 08/23/2015  . Visual floaters 09/12/2011  . Episodic low back pain 11/15/2010  . ROTATOR CUFF INJURY, RIGHT SHOULDER 11/03/2008  . ADHESIVE CAPSULITIS OF SHOULDER 11/12/2007  . OBESITY NOS 05/17/2006  . SYNDROME, CARPAL TUNNEL 05/17/2006  . FOOT PAIN 05/17/2006  . Hyperlipidemia 02/23/2006  . DEPRESSION 02/23/2006  . Essential hypertension 02/23/2006    Patient's Medications  New Prescriptions   No medications on file  Previous Medications   ACETAMINOPHEN (TYLENOL) 650 MG CR TABLET    Take 1,300 mg by mouth every 8 (eight) hours as needed for pain.   CHOLECALCIFEROL (VITAMIN D) 1000 UNITS TABLET    Take 1,000 Units by mouth daily.    CLOTRIMAZOLE-BETAMETHASONE (LOTRISONE) CREAM    For athlete's feet, apply to both feet and between toes bid x 4 weeks.   EMTRICITABINE-RILPIVIR-TENOFOVIR AF (ODEFSEY) 200-25-25 MG TABS TABLET    Take 1 tablet by mouth daily with breakfast. Take with Tivicay.   FENOFIBRATE 160 MG TABLET    Take 160 mg by mouth daily.    FEXOFENADINE (ALLEGRA) 180 MG TABLET    Take 180 mg by mouth daily as needed for allergies.    FUROSEMIDE (LASIX) 20 MG TABLET    Take 1 tablet (20 mg total) by mouth daily.   GLUCOSAMINE-CHONDROITIN PO    Take 2 tablets by mouth daily.    IBUPROFEN (ADVIL) 800 MG TABLET    TAKE 1 TABLET BY MOUTH THREE TIMES DAILY FOR 3 DAYS THEN TAKE AS NEEDED   JARDIANCE  25 MG TABS TABLET    Take 25 mg by mouth daily.    METFORMIN (GLUCOPHAGE-XR) 500 MG 24 HR TABLET    Take 500 mg by mouth 2 (two) times daily with a meal.    METHOCARBAMOL (ROBAXIN) 750 MG TABLET    Take 750 mg by mouth as needed.    METOPROLOL TARTRATE (LOPRESSOR) 25 MG TABLET    Take 1 tablet (25 mg total) by mouth daily.   MULTIPLE VITAMIN (MULTIVITAMIN WITH MINERALS) TABS TABLET    Take 1 tablet by mouth daily.   REPATHA 140 MG/ML SOSY    INJECT 1 SYRINGE INTO THE SKIN Q 2 WEEKS   SPIRONOLACTONE (ALDACTONE) 25 MG TABLET    TAKE 1 TABLET DAILY   TIVICAY 50 MG TABLET    TAKE 1 TABLET (50 MG TOTAL) BY MOUTH DAILY. TAKE WITH ODEFSEY.   VITAMIN E 400 UNIT CAPSULE    Take 400 Units by mouth daily.  Modified Medications   No medications on file  Discontinued Medications   No medications on file    Subjective: Holly Stephens is in for her routine HIV follow-up visit.  She has not had any problems obtaining, taking or tolerating her Odefsey or Tivicay and as usual never misses a single dose.  She has received her maternal Covid vaccines without difficulty.  She has continued to work throughout most of  the Covid pandemic.  There is mandatory masking at work.  She is having problems with right knee and hip pain.  She has had some injections but has found that they do not work very well.  She is not sure what the next steps are.  Review of Systems: Review of Systems  Constitutional: Negative for fever and weight loss.  Musculoskeletal: Positive for joint pain.  Psychiatric/Behavioral: Negative for depression. The patient does not have insomnia.     Past Medical History:  Diagnosis Date  . Acute deep vein thrombosis (DVT) of popliteal vein of left lower extremity ([place]) 02/09/2016  . Arthritis    osteoarthritis  . Diabetes mellitus without complication ([place])   . History of radiation therapy 04/25/2016 - 05/23/2016   Left Axilla treated to 36 Gy in 20 fractions  . HIV infection ([place])   . Hodgkin  lymphoma, nodular sclerosis ([place]) 08/23/2015  . Human immunodeficiency virus (HIV) disease ([place]) 02/23/2006   Qualifier: Diagnosis of  By: Megan Salon MD, Lasalle Abee      Social History   Tobacco Use  . Smoking status: Never Smoker  . Smokeless tobacco: Never Used  Vaping Use  . Vaping Use: Never used  Substance Use Topics  . Alcohol use: No  . Drug use: No    Family History  Problem Relation Age of Onset  . Hypertension Mother   . Cancer Mother 79       breast   . Hypertension Father   . Heart attack Father   . Pulmonary embolism Sister   . Parkinson's disease Brother     Allergies  Allergen Reactions  . Augmentin [Amoxicillin-Pot Clavulanate] Hives and Other (See Comments)    Has patient had a PCN reaction causing immediate rash, facial/tongue/throat swelling, SOB or lightheadedness with hypotension: No Has patient had a PCN reaction causing severe rash involving mucus membranes or skin necrosis: No Has patient had a PCN reaction that required hospitalization No Has patient had a PCN reaction occurring within the last 10 years: No If all of the above answers are "NO", then may proceed with Cephalosporin use.  . Bleomycin Cough  . Cefpodoxime Proxetil Itching and Rash    Health Maintenance  Topic Date Due  . HEMOGLOBIN A1C  Never done  . OPHTHALMOLOGY EXAM  Never done  . URINE MICROALBUMIN  Never done  . COLONOSCOPY  Never done  . PAP SMEAR-Modifier  09/15/2017  . FOOT EXAM  07/03/2020  . MAMMOGRAM  07/24/2021  . TETANUS/TDAP  11/06/2022  . COVID-19 Vaccine  Completed  . Hepatitis C Screening  Completed  . HIV Screening  Completed    Objective:  Vitals:   10/01/19 0923  BP: 127/75  Pulse: 84  Temp: 98 F (36.7 C)  TempSrc: Oral  Weight: 255 lb (115.7 kg)   Body mass index is 48.18 kg/m (pended).  Physical Exam Constitutional:      Comments: She is in good spirits as usual.  Cardiovascular:     Rate and Rhythm: Normal rate and regular rhythm.     Heart  sounds: No murmur heard.   Pulmonary:     Effort: Pulmonary effort is normal.     Breath sounds: Normal breath sounds.  Psychiatric:        Mood and Affect: Mood normal.     Lab Results Lab Results  Component Value Date   WBC 7.2 09/04/2019   HGB 16.5 (H) 09/04/2019   HCT 48.4 (H) 09/04/2019   MCV 90.3 09/04/2019  PLT 240 09/04/2019    Lab Results  Component Value Date   CREATININE 0.69 09/04/2019   BUN 15 09/04/2019   NA 141 09/04/2019   K 4.7 09/04/2019   CL 102 09/04/2019   CO2 26 09/04/2019    Lab Results  Component Value Date   ALT 37 (H) 09/04/2019   AST 26 09/04/2019   ALKPHOS 40 06/03/2019   BILITOT 0.5 09/04/2019    Lab Results  Component Value Date   CHOL 177 09/04/2019   HDL 48 (L) 09/04/2019   LDLCALC 98 09/04/2019   TRIG 223 (H) 09/04/2019   CHOLHDL 3.7 09/04/2019   Lab Results  Component Value Date   LABRPR NON-REACTIVE 09/04/2019   HIV 1 RNA Quant (copies/mL)  Date Value  09/04/2019 55 (H)  09/05/2018 <20 NOT DETECTED  09/06/2017 <20 NOT DETECTED   CD4 T Cell Abs (/uL)  Date Value  09/04/2019 984  09/05/2018 778  09/06/2017 790     Problem List Items Addressed This Visit      High   Human immunodeficiency virus (HIV) disease ([place])    She has very low level viral activation that is not clinically significant.  Her CD4 count remains high normal and her adherence is perfect.  She will be switching to Medicare coverage next May.  She will follow-up here in April to make sure that there are no problems with the switch.           Michel Bickers, MD [facility] [phone] pager   [phone] cell 10/01/2019, 9:47 AM

## 2019-10-01 NOTE — Assessment & Plan Note (Signed)
She has very low level viral activation that is not clinically significant.  Her CD4 count remains high normal and her adherence is perfect.  She will be switching to Medicare coverage next May.  She will follow-up here in April to make sure that there are no problems with the switch.

## 2019-10-07 DIAGNOSIS — M1611 Unilateral primary osteoarthritis, right hip: Secondary | ICD-10-CM | POA: Diagnosis not present

## 2019-10-07 DIAGNOSIS — M25551 Pain in right hip: Secondary | ICD-10-CM | POA: Diagnosis not present

## 2019-10-10 ENCOUNTER — Encounter: Payer: Self-pay | Admitting: Podiatry

## 2019-10-10 ENCOUNTER — Ambulatory Visit: Payer: BC Managed Care – PPO | Admitting: Podiatry

## 2019-10-10 ENCOUNTER — Other Ambulatory Visit: Payer: Self-pay

## 2019-10-10 DIAGNOSIS — M79674 Pain in right toe(s): Secondary | ICD-10-CM

## 2019-10-10 DIAGNOSIS — M2141 Flat foot [pes planus] (acquired), right foot: Secondary | ICD-10-CM

## 2019-10-10 DIAGNOSIS — M79675 Pain in left toe(s): Secondary | ICD-10-CM

## 2019-10-10 DIAGNOSIS — E119 Type 2 diabetes mellitus without complications: Secondary | ICD-10-CM

## 2019-10-10 DIAGNOSIS — M2142 Flat foot [pes planus] (acquired), left foot: Secondary | ICD-10-CM

## 2019-10-10 DIAGNOSIS — B351 Tinea unguium: Secondary | ICD-10-CM | POA: Diagnosis not present

## 2019-10-11 NOTE — Progress Notes (Signed)
Subjective: Holly Stephens presents today for painful mycotic nails b/l that are difficult to trim. Pain interferes with ambulation. Aggravating factors include wearing enclosed shoe gear. Pain is relieved with periodic professional debridement.    She states she is having problems with her right hip and has an appointment with Orthopedic hip specialist next Monday.  She voices no new pedal problems on today's visit.  Past Medical History:  Diagnosis Date  . Acute deep vein thrombosis (DVT) of popliteal vein of left lower extremity ([place]) 02/09/2016  . Arthritis    osteoarthritis  . Diabetes mellitus without complication ([place])   . History of radiation therapy 04/25/2016 - 05/23/2016   Left Axilla treated to 36 Gy in 20 fractions  . HIV infection ([place])   . [name], nodular sclerosis ([place]) 08/23/2015  . Human immunodeficiency virus (HIV) disease ([place]) 02/23/2006   Qualifier: Diagnosis of  By: Megan Salon MD, John      Patient Active Problem List   Diagnosis Date Noted  . Pain in left knee 02/19/2018  . 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 Acute deep vein thrombosis (DVT) of popliteal vein of left lower extremity ([place]) 02/09/2016  . Unintentional weight loss 12/16/2015  . Congestive heart failure with cardiomyopathy ([place]) 10/27/2015  . Acute congestive heart failure ([place]) 10/27/2015  . 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 Visual floaters 09/12/2011  . Episodic low back pain 11/15/2010  . ROTATOR CUFF INJURY, RIGHT SHOULDER 11/03/2008  . ADHESIVE CAPSULITIS OF SHOULDER 11/12/2007  . OBESITY NOS 05/17/2006  . SYNDROME, CARPAL TUNNEL 05/17/2006  . FOOT PAIN 05/17/2006  . Human immunodeficiency virus (HIV) disease ([place]) 02/23/2006  . Hyperlipidemia 02/23/2006  . DEPRESSION 02/23/2006  . Essential hypertension 02/23/2006    Past Surgical History:  Procedure Laterality Date  .  AXILLARY LYMPH NODE BIOPSY Left 07/23/2015   Procedure: EXCISIONAL BIOPSY LEFT AXILLARY LYMPH NODE;  Surgeon: [name], MD;  Location: [facility];  Service: General;  Laterality: Left;  . PORT-A-CATH REMOVAL N/A 01/09/2018   Procedure: MINOR REMOVAL PORT-A-CATH;  Surgeon: Aviva Signs, MD;  Location: [facility];  Service: General;  Laterality: N/A;  . PORTACATH PLACEMENT Right 09/08/2015   Procedure: INSERTION OF CENTRAL VENOUS CATHETER WITH PORT FOR CHEMOTHERAPY;  Surgeon: [name], MD;  Location: [facility];  Service: General;  Laterality: Right;  . SHOULDER SURGERY Right    removal of bone spur    Current Outpatient Medications on File Prior to Visit  Medication Sig Dispense Refill  . acetaminophen (TYLENOL) 650 MG CR tablet Take 1,300 mg by mouth every 8 (eight) hours as needed for pain.    . cholecalciferol (VITAMIN D) 1000 units tablet Take 1,000 Units by mouth daily.     . clotrimazole-betamethasone (LOTRISONE) cream For athlete's feet, apply to both feet and between toes bid x 4 weeks. 45 g 1  . emtricitabine-rilpivir-tenofovir AF (ODEFSEY) 200-25-25 MG TABS tablet Take 1 tablet by mouth daily with breakfast. Take with Tivicay. 30 tablet 3  . fenofibrate 160 MG tablet Take 160 mg by mouth daily.     . fexofenadine (ALLEGRA) 180 MG tablet Take 180 mg by mouth daily as needed for allergies.     . furosemide (LASIX) 20 MG tablet Take 1 tablet (20 mg total) by mouth daily. 90 tablet 2  . GLUCOSAMINE-CHONDROITIN PO Take 2 tablets by mouth daily.     Marland Kitchen ibuprofen (ADVIL) 800  MG tablet TAKE 1 TABLET BY MOUTH THREE TIMES DAILY FOR 3 DAYS THEN TAKE AS NEEDED    . JARDIANCE 25 MG TABS tablet Take 25 mg by mouth daily.     . metFORMIN (GLUCOPHAGE-XR) 500 MG 24 hr tablet Take 500 mg by mouth 2 (two) times daily with a meal.     . methocarbamol (ROBAXIN) 750 MG tablet Take 750 mg by mouth as needed.     . metoprolol tartrate (LOPRESSOR) 25 MG tablet Take 1 tablet (25 mg total) by mouth daily. 30  tablet 0  . Multiple Vitamin (MULTIVITAMIN WITH MINERALS) TABS tablet Take 1 tablet by mouth daily.    Marland Kitchen REPATHA 140 MG/ML SOSY INJECT 1 SYRINGE INTO THE SKIN Q 2 WEEKS    . spironolactone (ALDACTONE) 25 MG tablet TAKE 1 TABLET DAILY (Patient taking differently: Take 25 mg by mouth daily. ) 90 tablet 3  . TIVICAY 50 MG tablet TAKE 1 TABLET (50 MG TOTAL) BY MOUTH DAILY. TAKE WITH ODEFSEY. 30 tablet 3  . vitamin E 400 UNIT capsule Take 400 Units by mouth daily.     No current facility-administered medications on file prior to visit.     Allergies  Allergen Reactions  . Augmentin [Amoxicillin-Pot Clavulanate] Hives and Other (See Comments)    Has patient had a PCN reaction causing immediate rash, facial/tongue/throat swelling, SOB or lightheadedness with hypotension: No Has patient had a PCN reaction causing severe rash involving mucus membranes or skin necrosis: No Has patient had a PCN reaction that required hospitalization No Has patient had a PCN reaction occurring within the last 10 years: No If all of the above answers are "NO", then may proceed with Cephalosporin use.  . Bleomycin Cough  . Cefpodoxime Proxetil Itching and Rash    Social History   Occupational History  . Not on file  Tobacco Use  . Smoking status: Never Smoker  . Smokeless tobacco: Never Used  Vaping Use  . Vaping Use: Never used  Substance and Sexual Activity  . Alcohol use: No  . Drug use: No  . Sexual activity: Never    Comment: declined condoms    Family History  Problem Relation Age of Onset  . Hypertension Mother   . Cancer Mother 44       breast   . Hypertension Father   . Heart attack Father   . Pulmonary embolism Sister   . Parkinson's disease Brother     Immunization History  Administered Date(s) Administered  . H1N1 03/23/2008  . Hepatitis B 04/02/2001, 05/17/2001, 09/17/2001  . Influenza Split 11/15/2010, 12/05/2011  . Influenza Whole 11/15/2005, 11/16/2005, 11/20/2006, 11/12/2007,  12/03/2008, 11/29/2009  . Influenza,inj,Quad PF,6+ Mos 11/05/2012, 12/01/2015  . Influenza-Unspecified 11/19/2013, 11/09/2016  . Moderna SARS-COVID-2 Vaccination 05/08/2019, 06/06/2019  . Pneumococcal Polysaccharide-23 11/16/2005, 11/15/2010  . Tdap 11/05/2012     Objective: There were no vitals filed for this visit.  JEWELLE WHITNER is a pleasant 64 y.o. female, morbidly obese,  in NAD. AAO X 3.  Vascular Examination: Capillary fill time to digits <3 seconds b/l. Palpable DP pulses b/l. Palpable PT pulses b/l. Pedal hair sparse b/l. Skin temperature gradient within normal limits b/l.  Dermatological Examination: Pedal skin with normal turgor, texture and tone bilaterally. No open wounds bilaterally. No interdigital macerations bilaterally. Toenails 1-5 b/l elongated, discolored, dystrophic, thickened, crumbly with subungual debris and tenderness to dorsal palpation. Resolved tinea pedis b/l.  Musculoskeletal Examination: Normal muscle strength 5/5 to all lower extremity muscle groups  bilaterally. No pain crepitus or joint limitation noted with ROM b/l. Pes planus deformity noted b/l.   Neurological Examination: Protective sensation intact 5/5 intact bilaterally with 10g monofilament b/l. Vibratory sensation intact b/l.  Assessment: 1. Pain due to onychomycosis of toenails of both feet   2. Pes planus of both feet   3. Controlled type 2 diabetes mellitus without complication, without long-term current use of insulin ([place])     Plan: -Examined patient.  -Diabetic foot examination peformed. Foot deformity with diabetes -No new findings. No new orders. -Toenails 1-5 b/l were debrided in length and girth with sterile nail nippers and dremel without iatrogenic bleeding.  -For tinea pedis, Rx was written for Lotrisone Cream to be applied bid for 4 weeks. -Patient to continue soft, supportive shoe gear daily. -Patient to report any pedal injuries to medical professional  immediately. -Patient/POA to call should there be question/concern in the interim.  Return in about 3 months (around 01/10/2020).

## 2019-10-13 DIAGNOSIS — Z6841 Body Mass Index (BMI) 40.0 and over, adult: Secondary | ICD-10-CM | POA: Diagnosis not present

## 2019-10-13 DIAGNOSIS — M1611 Unilateral primary osteoarthritis, right hip: Secondary | ICD-10-CM | POA: Diagnosis not present

## 2019-10-22 ENCOUNTER — Ambulatory Visit: Payer: BC Managed Care – PPO | Admitting: Infectious Diseases

## 2019-10-31 DIAGNOSIS — E119 Type 2 diabetes mellitus without complications: Secondary | ICD-10-CM | POA: Diagnosis not present

## 2019-10-31 DIAGNOSIS — R739 Hyperglycemia, unspecified: Secondary | ICD-10-CM | POA: Diagnosis not present

## 2019-10-31 DIAGNOSIS — I10 Essential (primary) hypertension: Secondary | ICD-10-CM | POA: Diagnosis not present

## 2019-10-31 DIAGNOSIS — E782 Mixed hyperlipidemia: Secondary | ICD-10-CM | POA: Diagnosis not present

## 2019-11-07 DIAGNOSIS — E782 Mixed hyperlipidemia: Secondary | ICD-10-CM | POA: Diagnosis not present

## 2019-11-07 DIAGNOSIS — E1169 Type 2 diabetes mellitus with other specified complication: Secondary | ICD-10-CM | POA: Diagnosis not present

## 2019-11-07 DIAGNOSIS — I10 Essential (primary) hypertension: Secondary | ICD-10-CM | POA: Diagnosis not present

## 2019-11-07 DIAGNOSIS — R945 Abnormal results of liver function studies: Secondary | ICD-10-CM | POA: Diagnosis not present

## 2019-11-10 ENCOUNTER — Telehealth: Payer: Self-pay

## 2019-11-10 NOTE — Telephone Encounter (Signed)
COVID-19 Pre-Screening Questions:11/10/19  Do you currently have a fever (>100 F), chills or unexplained body aches? NO  Are you currently experiencing new cough, shortness of breath, sore throat, runny nose?NO .  Have you been in contact with someone that is currently pending confirmation of Covid19 testing or has been confirmed to have the [place] virus?NO   **If the patient answers NO to ALL questions -  advise the patient to please call the clinic before coming to the office should any symptoms develop.

## 2019-11-11 ENCOUNTER — Other Ambulatory Visit (HOSPITAL_COMMUNITY)
Admission: RE | Admit: 2019-11-11 | Discharge: 2019-11-11 | Disposition: A | Discharge status: Home / Self Care | Payer: BC Managed Care – PPO | Source: Ambulatory Visit | Attending: Infectious Diseases | Admitting: Infectious Diseases

## 2019-11-11 ENCOUNTER — Other Ambulatory Visit: Payer: Self-pay

## 2019-11-11 ENCOUNTER — Encounter: Payer: Self-pay | Admitting: Infectious Diseases

## 2019-11-11 ENCOUNTER — Ambulatory Visit: Payer: BC Managed Care – PPO | Admitting: Infectious Diseases

## 2019-11-11 VITALS — BP 102/68 | HR 91 | Temp 97.8°F

## 2019-11-11 DIAGNOSIS — B20 Human immunodeficiency virus [HIV] disease: Secondary | ICD-10-CM

## 2019-11-11 DIAGNOSIS — Z113 Encounter for screening for infections with a predominantly sexual mode of transmission: Secondary | ICD-10-CM | POA: Diagnosis not present

## 2019-11-11 DIAGNOSIS — Z124 Encounter for screening for malignant neoplasm of cervix: Secondary | ICD-10-CM | POA: Diagnosis not present

## 2019-11-11 NOTE — Assessment & Plan Note (Signed)
Normal pelvic exam.  Cervical brushing collected for cytology and HPV.  Discussed recommended screening interval for women living with HIV disease meant to be lifelong and at an interval of Q1-3 years pending results. Acceptable to space out Q3y with 3 consecutively normal exams. Further recommendations for Holly Stephens to follow today's results.  Results will be communicated to the patient via mychart message

## 2019-11-11 NOTE — Assessment & Plan Note (Signed)
Under excellent control. Working with Dr. Megan Salon and will follow up as outlined.

## 2019-11-11 NOTE — Progress Notes (Signed)
      Subjective:    Holly Stephens is a 64 y.o. female here for an annual pelvic exam and pap smear.   Review of Systems: Current GYN complaints or concerns: none Patient denies any abdominal/pelvic pain, problems with bowel movements, urination, vaginal discharge or intercourse.    Past Medical History:  Diagnosis Date  . Acute deep vein thrombosis (DVT) of popliteal vein of left lower extremity ([place]) 02/09/2016  . Arthritis    osteoarthritis  . Diabetes mellitus without complication ([place])   . History of radiation therapy 04/25/2016 - 05/23/2016   Left Axilla treated to 36 Gy in 20 fractions  . HIV infection ([place])   . [name], nodular sclerosis ([place]) 08/23/2015  . Human immunodeficiency virus (HIV) disease ([place]) 02/23/2006   Qualifier: Diagnosis of  By: Megan Salon MD, John      Gynecologic History: No obstetric history on file.  No LMP recorded. Patient is postmenopausal. Contraception: abstinence Last Pap: 2018. Results were: normal Anal Intercourse:  Last Mammogram: April 2020. Results were: normal  Objective:  Physical Exam  Constitutional: Well developed, well nourished, no acute distress. She is alert and oriented x3.  Pelvic: External genitalia is normal in appearance. The vagina is normal in appearance. The cervix is bulbous and easily visualized. No CMT, normal expected cervical mucus present.  Psych: She has a normal mood and affect.    Assessment:   Problem List Items Addressed This Visit      Unprioritized   Screening for cervical cancer - Primary    Normal pelvic exam.  Cervical brushing collected for cytology and HPV.  Discussed recommended screening interval for women living with HIV disease meant to be lifelong and at an interval of Q1-3 years pending results. Acceptable to space out Q3y with 3 consecutively normal exams. Further recommendations for Holly Stephens to follow today's results.  Results will be communicated to the patient via  mychart message       Relevant Orders   Cytology - PAP( [facility])   Routine screening for STI (sexually transmitted infection)   Relevant Orders   Urine cytology ancillary only   Human immunodeficiency virus (HIV) disease ([place])    Under excellent control. Working with Dr. Megan Salon and will follow up as outlined.           Janene Madeira, MSN, NP-C [facility] [place] [facility] Office: [phone] Pager: [phone]   11/11/19 4:41 PM

## 2019-11-13 DIAGNOSIS — E119 Type 2 diabetes mellitus without complications: Secondary | ICD-10-CM | POA: Diagnosis not present

## 2019-11-13 LAB — URINE CYTOLOGY ANCILLARY ONLY
Chlamydia: NEGATIVE
Comment: NEGATIVE
Comment: NORMAL
Neisseria Gonorrhea: NEGATIVE

## 2019-11-14 LAB — CYTOLOGY - PAP
Chlamydia: NEGATIVE
Comment: NEGATIVE
Comment: NEGATIVE
Comment: NORMAL
Diagnosis: NEGATIVE
High risk HPV: NEGATIVE
Neisseria Gonorrhea: NEGATIVE

## 2019-12-04 ENCOUNTER — Other Ambulatory Visit (HOSPITAL_COMMUNITY): Payer: Self-pay

## 2019-12-04 DIAGNOSIS — C8118 Nodular sclerosis classical Hodgkin lymphoma, lymph nodes of multiple sites: Secondary | ICD-10-CM

## 2019-12-05 ENCOUNTER — Other Ambulatory Visit: Payer: Self-pay

## 2019-12-05 ENCOUNTER — Inpatient Hospital Stay (HOSPITAL_COMMUNITY): Payer: BC Managed Care – PPO | Attending: Hematology

## 2019-12-05 DIAGNOSIS — Z8249 Family history of ischemic heart disease and other diseases of the circulatory system: Secondary | ICD-10-CM | POA: Insufficient documentation

## 2019-12-05 DIAGNOSIS — C8118 Nodular sclerosis classical Hodgkin lymphoma, lymph nodes of multiple sites: Secondary | ICD-10-CM | POA: Diagnosis not present

## 2019-12-05 DIAGNOSIS — Z79899 Other long term (current) drug therapy: Secondary | ICD-10-CM | POA: Insufficient documentation

## 2019-12-05 DIAGNOSIS — R5383 Other fatigue: Secondary | ICD-10-CM | POA: Diagnosis not present

## 2019-12-05 DIAGNOSIS — Z818 Family history of other mental and behavioral disorders: Secondary | ICD-10-CM | POA: Diagnosis not present

## 2019-12-05 DIAGNOSIS — I700 Atherosclerosis of aorta: Secondary | ICD-10-CM | POA: Diagnosis not present

## 2019-12-05 DIAGNOSIS — Z836 Family history of other diseases of the respiratory system: Secondary | ICD-10-CM | POA: Insufficient documentation

## 2019-12-05 DIAGNOSIS — B20 Human immunodeficiency virus [HIV] disease: Secondary | ICD-10-CM | POA: Diagnosis not present

## 2019-12-05 DIAGNOSIS — Z803 Family history of malignant neoplasm of breast: Secondary | ICD-10-CM | POA: Diagnosis not present

## 2019-12-05 DIAGNOSIS — D3502 Benign neoplasm of left adrenal gland: Secondary | ICD-10-CM | POA: Diagnosis not present

## 2019-12-05 DIAGNOSIS — K439 Ventral hernia without obstruction or gangrene: Secondary | ICD-10-CM | POA: Diagnosis not present

## 2019-12-05 LAB — CBC WITH DIFFERENTIAL/PLATELET
Abs Immature Granulocytes: 0.02 10*3/uL (ref 0.00–0.07)
Basophils Absolute: 0 10*3/uL (ref 0.0–0.1)
Basophils Relative: 1 %
Eosinophils Absolute: 0.2 10*3/uL (ref 0.0–0.5)
Eosinophils Relative: 2 %
HCT: 51.4 % — ABNORMAL HIGH (ref 36.0–46.0)
Hemoglobin: 17 g/dL — ABNORMAL HIGH (ref 12.0–15.0)
Immature Granulocytes: 0 %
Lymphocytes Relative: 36 %
Lymphs Abs: 2.5 10*3/uL (ref 0.7–4.0)
MCH: 31.1 pg (ref 26.0–34.0)
MCHC: 33.1 g/dL (ref 30.0–36.0)
MCV: 94.1 fL (ref 80.0–100.0)
Monocytes Absolute: 0.5 10*3/uL (ref 0.1–1.0)
Monocytes Relative: 7 %
Neutro Abs: 3.8 10*3/uL (ref 1.7–7.7)
Neutrophils Relative %: 54 %
Platelets: 291 10*3/uL (ref 150–400)
RBC: 5.46 MIL/uL — ABNORMAL HIGH (ref 3.87–5.11)
RDW: 13.2 % (ref 11.5–15.5)
WBC: 7 10*3/uL (ref 4.0–10.5)
nRBC: 0 % (ref 0.0–0.2)

## 2019-12-05 LAB — COMPREHENSIVE METABOLIC PANEL
ALT: 33 U/L (ref 0–44)
AST: 28 U/L (ref 15–41)
Albumin: 4.6 g/dL (ref 3.5–5.0)
Alkaline Phosphatase: 35 U/L — ABNORMAL LOW (ref 38–126)
Anion gap: 7 (ref 5–15)
BUN: 17 mg/dL (ref 8–23)
CO2: 28 mmol/L (ref 22–32)
Calcium: 10.5 mg/dL — ABNORMAL HIGH (ref 8.9–10.3)
Chloride: 103 mmol/L (ref 98–111)
Creatinine, Ser: 0.61 mg/dL (ref 0.44–1.00)
GFR, Estimated: >60 mL/min (ref >60)
Glucose, Bld: 159 mg/dL — ABNORMAL HIGH (ref 70–99)
Potassium: 4.4 mmol/L (ref 3.5–5.1)
Sodium: 138 mmol/L (ref 135–145)
Total Bilirubin: 0.7 mg/dL (ref 0.3–1.2)
Total Protein: 8.1 g/dL (ref 6.5–8.1)

## 2019-12-05 LAB — VITAMIN D 25 HYDROXY (VIT D DEFICIENCY, FRACTURES): Vit D, 25-Hydroxy: 28.38 ng/mL — ABNORMAL LOW (ref 30–100)

## 2019-12-05 LAB — SEDIMENTATION RATE: Sed Rate: 2 mm/hr (ref 0–22)

## 2019-12-05 LAB — LACTATE DEHYDROGENASE: LDH: 120 U/L (ref 98–192)

## 2019-12-05 LAB — VITAMIN B12: Vitamin B-12: 332 pg/mL (ref 180–914)

## 2019-12-12 ENCOUNTER — Ambulatory Visit (HOSPITAL_COMMUNITY): Payer: BC Managed Care – PPO | Admitting: Nurse Practitioner

## 2019-12-14 NOTE — Progress Notes (Signed)
[facility] up:    Holly Squibb, MD [place] [address]   DIAGNOSIS:Hodgkins lymphoma   Cancer Staging Hodgkin's disease, nodular sclerosis, of lymph nodes of multiple sites [facility]) Staging form: Lymphoid Neoplasms, AJCC 6th Edition - Clinical stage from 08/31/2015: Stage III - Signed by Baird Cancer, PA-C on 09/01/2015   SUMMARY OF ONCOLOGIC HISTORY: Oncology History  Hodgkin's disease, nodular sclerosis, of lymph nodes of multiple sites ([place])  07/23/2015 Procedure   Lext axilla lymph node biopsy   07/27/2015 Pathology Results   Classical [name], nodular sclerosis type.  Positive CD30, CD15, andPAX-5.  Negative CD45 and CD20.   08/31/2015 PET scan   Hypermetabolic lymphadenopathy. Nodal stations include: L supraclavicular nodes, L axillary nodes, mediastinal lymph nodes, periaortic retroperitoneal and periportal lymph nodes as well as L iliac and inguinal lymph nodes. Two concerning pulm nodules...   09/01/2015 Imaging   MUGA- Normal LEFT ventricular ejection fraction of 73%.  Normal LEFT ventricular wall motion.   09/01/2015 Procedure   PFTs-  Spirometry shows mild ventilatory defect without airflow obstruction and no bronchodilator improvement. Lung volumes are normal. Airway resistance is normal. DLCO normal. Essentially normal lung function   09/08/2015 Procedure   Port placed by Dr. Rosana Hoes   09/13/2015 - 02/23/2016 Chemotherapy   ABVD   09/27/2015 Imaging   CT angio of chest- no gross PE seen. Improving axillary, mediastinal and upper abdominal adenopathy. Improved and possibly resolved RUL nodule and with resolved or obscured LLL nodule. Small L pleural effusion and mild L lower lobe atelectasis.   10/11/2015 Treatment Plan Change   Bleomycin discontinued    11/01/2015 PET scan   Clear interval response to therapy with decrease in size and hypermetabolism of larger lymph nodes seen previously and some of the smaller  hypermetabolic lymph node seen on the previous study have resolved completely in terms of abnormal enlargement and hypermetabolism. 2. Slight decrease in left lower lobe pulmonary nodule with interval resolution of the right upper lobe pulmonary nodule. 3. Stable appearance of mild heterogeneous FDG accumulation within the marrow space.   11/01/2015 Miscellaneous   Deauville 3- 4 more cycles of AVD recommended according to NCCN guidelines.   02/09/2016 Imaging   [place] left leg- Positive for acute DVT in left calf, popliteal and femoral veins.   03/22/2016 PET scan   1. Mild residual metabolic activity within LEFT axillary lymph nodes ( Deauville 2 ). 2. No additional hypermetabolic adenopathy identified. 3. Normal spleen and marrow activity.   04/25/2016 - 05/23/2016 Radiation Therapy   Residual disease treated with radiation Isidore Moos): Left axilla treated with 36 Gy in 20 fractions.   07/24/2016 PET scan   PET IMPRESSION: 1. Stable exam. The mild residual low level metabolic activity within left axillary lymph nodes a similar to prior (Deauville 2). 2. No new or progressive findings. 3. Abdominal aortic atherosclerosis.    11/23/2016 Imaging   CT C/A/P: 1. Essentially stable left axillary adenopathy. No new adenopathy identified. 2.  Aortic Atherosclerosis (ICD10-I70.0). 3. Small left adrenal adenoma. 4. Midline ventral hernia contains about 630 cc of omental adipose tissue.     CURRENT THERAPY: Clinical surveillence  INTERVAL HISTORY: Holly Stephens 64 y.o. female presented to clinic for clinical surveillance per NCCN guidelines.  She reports she has been doing well since her last visit. She denies any recurrent infections. She denies any b symptoms including fevers, night sweats, chills, or unexplained weight loss. Denies any  nausea, vomiting, or diarrhea. Denies any new pains. Had not noticed any recent bleeding such as epistaxis, hematuria or hematochezia. Denies recent chest  pain on exertion, shortness of breath on minimal exertion, pre-syncopal episodes, or palpitations. Denies any numbness or tingling in hands or feet. Denies any recent fevers, infections, or recent hospitalizations. Patient reports appetite at 100% and energy level at 50%. She is eating well and maintaining her weight at this time.     Patient Active Problem List   Diagnosis Date Noted  . Routine screening for STI (sexually transmitted infection) 11/11/2019  . Screening for cervical cancer 11/11/2019  . Pain in left knee 02/19/2018  . 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 Acute deep vein thrombosis (DVT) of popliteal vein of left lower extremity ([place]) 02/09/2016  . Congestive heart failure with cardiomyopathy ([place]) 10/27/2015  . Acute congestive heart failure ([place]) 10/27/2015  . Hodgkin's disease, nodular sclerosis, of lymph nodes of multiple sites ([place]) 08/23/2015  . Visual floaters 09/12/2011  . Episodic low back pain 11/15/2010  . ROTATOR CUFF INJURY, RIGHT SHOULDER 11/03/2008  . ADHESIVE CAPSULITIS OF SHOULDER 11/12/2007  . OBESITY NOS 05/17/2006  . SYNDROME, CARPAL TUNNEL 05/17/2006  . FOOT PAIN 05/17/2006  . Human immunodeficiency virus (HIV) disease ([place]) 02/23/2006  . Hyperlipidemia 02/23/2006  . DEPRESSION 02/23/2006  . Essential hypertension 02/23/2006    is allergic to augmentin [amoxicillin-pot clavulanate], bleomycin, and cefpodoxime proxetil.  MEDICAL HISTORY: Past Medical History:  Diagnosis Date  . Acute deep vein thrombosis (DVT) of popliteal vein of left lower extremity ([place]) 02/09/2016  . Arthritis    osteoarthritis  . Diabetes mellitus without complication ([place])   . History of radiation therapy 04/25/2016 - 05/23/2016   Left Axilla treated to 36 Gy in 20 fractions  . HIV infection ([place])   . [name], nodular sclerosis ([place]) 08/23/2015  . Human immunodeficiency virus (HIV) disease ([place]) 02/23/2006   Qualifier:  Diagnosis of  By: Megan Salon MD, John      SURGICAL HISTORY: Past Surgical History:  Procedure Laterality Date  . AXILLARY LYMPH NODE BIOPSY Left 07/23/2015   Procedure: EXCISIONAL BIOPSY LEFT AXILLARY LYMPH NODE;  Surgeon: [name], MD;  Location: [facility];  Service: General;  Laterality: Left;  . PORT-A-CATH REMOVAL N/A 01/09/2018   Procedure: MINOR REMOVAL PORT-A-CATH;  Surgeon: Aviva Signs, MD;  Location: [facility];  Service: General;  Laterality: N/A;  . PORTACATH PLACEMENT Right 09/08/2015   Procedure: INSERTION OF CENTRAL VENOUS CATHETER WITH PORT FOR CHEMOTHERAPY;  Surgeon: [name], MD;  Location: [facility];  Service: General;  Laterality: Right;  . SHOULDER SURGERY Right    removal of bone spur    SOCIAL HISTORY: Social History   Socioeconomic History  . Marital status: Widowed    Spouse name: Not on file  . Number of children: Not on file  . Years of education: Not on file  . Highest education level: Not on file  Occupational History  . Not on file  Tobacco Use  . Smoking status: Never Smoker  . Smokeless tobacco: Never Used  Vaping Use  . Vaping Use: Never used  Substance and Sexual Activity  . Alcohol use: No  . Drug use: No  . Sexual activity: Never    Comment: declined condoms  Other Topics Concern  . Not on file  Social History Narrative  . Not on file   Social Determinants of Health   Financial Resource Strain:   .  Difficulty of Paying Living Expenses: Not on file  Food Insecurity:   . Worried About Charity fundraiser in the Last Year: Not on file  . Ran Out of Food in the Last Year: Not on file  Transportation Needs:   . Lack of Transportation (Medical): Not on file  . Lack of Transportation (Non-Medical): Not on file  Physical Activity:   . Days of Exercise per Week: Not on file  . Minutes of Exercise per Session: Not on file  Stress:   . Feeling of Stress : Not on file  Social Connections:   . Frequency of Communication with Friends  and Family: Not on file  . Frequency of Social Gatherings with Friends and Family: Not on file  . Attends Religious Services: Not on file  . Active Member of Clubs or Organizations: Not on file  . Attends Archivist Meetings: Not on file  . Marital Status: Not on file  Intimate Partner Violence:   . Fear of Current or Ex-Partner: Not on file  . Emotionally Abused: Not on file  . Physically Abused: Not on file  . Sexually Abused: Not on file    FAMILY HISTORY: Family History  Problem Relation Age of Onset  . Hypertension Mother   . Cancer Mother 62       breast   . Hypertension Father   . Heart attack Father   . Pulmonary embolism Sister   . Parkinson's disease Brother     Review of Systems  Constitutional: Positive for fatigue. Negative for appetite change, fever and unexpected weight change.  HENT:   Negative for nosebleeds, sore throat and trouble swallowing.   Eyes: Negative.   Respiratory: Negative.  Negative for cough, shortness of breath and wheezing.   Cardiovascular: Negative.  Negative for chest pain and leg swelling.  Gastrointestinal: Negative for abdominal pain, blood in stool, constipation, diarrhea, nausea and vomiting.  Endocrine: Negative.   Genitourinary: Negative.  Negative for bladder incontinence, hematuria and nocturia.   Musculoskeletal: Negative.  Negative for back pain and flank pain.  Skin: Negative.   Neurological: Negative.  Negative for dizziness, headaches, light-headedness and numbness.  Hematological: Negative.   Psychiatric/Behavioral: Negative.  Negative for confusion. The patient is not nervous/anxious.   All other systems reviewed and are negative.   Vital Signs: -deferred due to tele visit  Physical Exam Constitutional:      Appearance: Normal appearance.  HENT:     Head: Normocephalic and atraumatic.  Eyes:     Pupils: Pupils are equal, round, and reactive to light.  Cardiovascular:     Rate and Rhythm: Normal rate and  regular rhythm.     Heart sounds: Normal heart sounds. No murmur heard.   Pulmonary:     Effort: Pulmonary effort is normal.     Breath sounds: Normal breath sounds. No wheezing.  Abdominal:     General: Bowel sounds are normal. There is no distension.     Palpations: Abdomen is soft.     Tenderness: There is no abdominal tenderness.  Musculoskeletal:        General: Normal range of motion.     Cervical back: Normal range of motion.  Skin:    General: Skin is warm and dry.     Findings: No rash.  Neurological:     Mental Status: She is alert and oriented to person, place, and time.  Psychiatric:        Judgment: Judgment normal.  LABORATORY DATA:  CBC    Component Value Date/Time   WBC 7.0 12/05/2019 1021   RBC 5.46 (H) 12/05/2019 1021   HGB 17.0 (H) 12/05/2019 1021   HCT 51.4 (H) 12/05/2019 1021   PLT 291 12/05/2019 1021   MCV 94.1 12/05/2019 1021   MCH 31.1 12/05/2019 1021   MCHC 33.1 12/05/2019 1021   RDW 13.2 12/05/2019 1021   LYMPHSABS 2.5 12/05/2019 1021   MONOABS 0.5 12/05/2019 1021   EOSABS 0.2 12/05/2019 1021   BASOSABS 0.0 12/05/2019 1021    CMP     Component Value Date/Time   NA 138 12/05/2019 1021   K 4.4 12/05/2019 1021   CL 103 12/05/2019 1021   CO2 28 12/05/2019 1021   GLUCOSE 159 (H) 12/05/2019 1021   BUN 17 12/05/2019 1021   CREATININE 0.61 12/05/2019 1021   CREATININE 0.69 09/04/2019 0930   CALCIUM 10.5 (H) 12/05/2019 1021   PROT 8.1 12/05/2019 1021   ALBUMIN 4.6 12/05/2019 1021   AST 28 12/05/2019 1021   ALT 33 12/05/2019 1021   ALKPHOS 35 (L) 12/05/2019 1021   BILITOT 0.7 12/05/2019 1021   GFRNONAA >60 12/05/2019 1021   GFRNONAA >60 10/31/2010 0849   GFRAA >60 06/03/2019 0854   GFRAA >60 10/31/2010 0849    All questions were answered to patient's stated satisfaction. Encouraged patient to call with any new concerns or questions before his next visit to the cancer center and we can certain see him sooner, if needed.       ASSESSMENT and THERAPY PLAN:  1. Hodgkin's Lymphoma, nodular-sclerosing type involving multiple lymph nodes: -Left axilla pathology : CD30, CD15, and PAX-5 positive. -ABVD chemotherapy beginning on 09/13/2015, with discontinuation of Bleomycin on 10/11/2015. -Mid-course PET scan revealed Deauville 3 lymphadenopathy, so she received 4 additional cycles of AVD. -Treatment course was complicated by diastolic heart failure. -Chemotherapy completed on 02/23/2016. -Post-treatment PET revealed mild residual Left axillary adenopathy with a Deauville 2, so she was referred for axillary radiation therapy. -Completed XRT to left axilla on 05/23/16. -repeat PET on 07/2016 showed mild residual low level metabolic activity within left axillary lymph nodes a similar to prior deauville 2. -She has continued with clinical surveillance with scans yearly now. -Most recent CT CAP on 06/05/2019 showed no lymphadenopathy or evidence of diease.   -She denies any fever, chills, night sweats, or weight loss. No recurrent infections noted.  -We will see her back in 6 months with labs and CT C/A/P.  This will complete 3 years of surveillance.  If this scan is stable she likely can be pushed out to annual visits.  Per NCCN guidelines, she is to have H&P every 6 to 12 months until year 3 then annually until 5 years.  Lab work should consist of CBC, ESR and CMP.  CT CAP every 6 months for the first 2 years or as clinically indicated.   2.HIV: -She continues to follow up with Infectious disease.  3. Chemo induced heart failure: -She continues to follow up with cardiology.    Orders Placed This Encounter  Procedures  . CT CHEST ABDOMEN PELVIS W CONTRAST    Standing Status:   Future    Standing Expiration Date:   12/14/2020    Order Specific Question:   If indicated for the ordered procedure, I authorize the administration of contrast media per Radiology protocol    Answer:   Yes    Order Specific Question:   Preferred  imaging location?  Answer:   [facility]    Order Specific Question:   Is Oral Contrast requested for this exam?    Answer:   Yes, Per Radiology protocol    Order Specific Question:   Reason for Exam (SYMPTOM  OR DIAGNOSIS REQUIRED)    Answer:   Lymophoma f/u- annual    Greater than 50% was spent in counseling and coordination of care with this patient including but not limited to discussion of the relevant topics above (See A&P) including, but not limited to diagnosis and management of acute and chronic medical conditions.     Jacquelin Hawking, NP 12/15/2019

## 2019-12-15 ENCOUNTER — Inpatient Hospital Stay (HOSPITAL_COMMUNITY): Payer: BC Managed Care – PPO | Admitting: Oncology

## 2019-12-15 ENCOUNTER — Encounter (HOSPITAL_COMMUNITY): Payer: Self-pay | Admitting: Oncology

## 2019-12-15 ENCOUNTER — Other Ambulatory Visit: Payer: Self-pay

## 2019-12-15 DIAGNOSIS — I700 Atherosclerosis of aorta: Secondary | ICD-10-CM | POA: Diagnosis not present

## 2019-12-15 DIAGNOSIS — Z818 Family history of other mental and behavioral disorders: Secondary | ICD-10-CM | POA: Diagnosis not present

## 2019-12-15 DIAGNOSIS — Z79899 Other long term (current) drug therapy: Secondary | ICD-10-CM | POA: Diagnosis not present

## 2019-12-15 DIAGNOSIS — C8118 Nodular sclerosis classical Hodgkin lymphoma, lymph nodes of multiple sites: Secondary | ICD-10-CM

## 2019-12-15 DIAGNOSIS — Z803 Family history of malignant neoplasm of breast: Secondary | ICD-10-CM | POA: Diagnosis not present

## 2019-12-15 DIAGNOSIS — R5383 Other fatigue: Secondary | ICD-10-CM | POA: Diagnosis not present

## 2019-12-15 DIAGNOSIS — Z836 Family history of other diseases of the respiratory system: Secondary | ICD-10-CM | POA: Diagnosis not present

## 2019-12-15 DIAGNOSIS — B20 Human immunodeficiency virus [HIV] disease: Secondary | ICD-10-CM | POA: Diagnosis not present

## 2019-12-15 DIAGNOSIS — Z8249 Family history of ischemic heart disease and other diseases of the circulatory system: Secondary | ICD-10-CM | POA: Diagnosis not present

## 2019-12-15 DIAGNOSIS — D3502 Benign neoplasm of left adrenal gland: Secondary | ICD-10-CM | POA: Diagnosis not present

## 2019-12-15 DIAGNOSIS — K439 Ventral hernia without obstruction or gangrene: Secondary | ICD-10-CM | POA: Diagnosis not present

## 2019-12-15 NOTE — Assessment & Plan Note (Signed)
1. Hodgkin's Lymphoma, nodular-sclerosing type involving multiple lymph nodes: -Left axilla pathology : CD30, CD15, and PAX-5 positive. -ABVD chemotherapy beginning on 09/13/2015, with discontinuation of Bleomycin on 10/11/2015. -Mid-course PET scan revealed Deauville 3 lymphadenopathy, so she received 4 additional cycles of AVD. -Treatment course was complicated by diastolic heart failure. -Chemotherapy completed on 02/23/2016. -Post-treatment PET revealed mild residual Left axillary adenopathy with a Deauville 2, so she was referred for axillary radiation therapy. -Completed XRT to left axilla on 05/23/16. -repeat PET on 07/2016 showed mild residual low level metabolic activity within left axillary lymph nodes a similar to prior deauville 2. -She has continued with clinical surveillance with scans yearly now. -Most recent CT CAP on 06/05/2019 showed no lymphadenopathy or evidence of diease.   -She denies any fever, chills, night sweats, or weight loss. No recurrent infections noted.  -We will see her back in 6 months with labs   2.HIV: -She continues to follow up with Infectious disease.  3. Chemo induced heart failure: -She continues to follow up with cardiology.

## 2019-12-15 NOTE — Patient Instructions (Signed)
[facility] at [facility] Discharge Instructions  You saw Jenny Burns, NP, today.   Thank you for choosing [facility] at [facility] to provide your oncology and hematology care.  To afford each patient quality time with our provider, please arrive at least 15 minutes before your scheduled appointment time.   If you have a lab appointment with the Cancer Center please come in thru the Main Entrance and check in at the main information desk.  You need to re-schedule your appointment should you arrive 10 or more minutes late.  We strive to give you quality time with our providers, and arriving late affects you and other patients whose appointments are after yours.  Also, if you no show three or more times for appointments you may be dismissed from the clinic at the providers discretion.     Again, thank you for choosing [facility].  Our hope is that these requests will decrease the amount of time that you wait before being seen by our physicians.       _____________________________________________________________  Should you have questions after your visit to  Cancer Center, please contact our office at [phone] and follow the prompts.  Our office hours are 8:00 a.m. and 4:30 p.m. Monday - Friday.  Please note that voicemails left after 4:00 p.m. may not be returned until the following business day.  We are closed weekends and major holidays.  You do have access to a nurse 24-7, just call the main number to the clinic [phone] and do not press any options, hold on the line and a nurse will answer the phone.    For prescription refill requests, have your pharmacy contact our office and allow 72 hours.    Due to Covid, you will need to wear a mask upon entering the hospital. If you do not have a mask, a mask will be given to you at the Main Entrance upon arrival. For doctor visits, patients may have 1 support person age 18  or older with them. For treatment visits, patients can not have anyone with them due to social distancing guidelines and our immunocompromised population.     

## 2019-12-26 ENCOUNTER — Other Ambulatory Visit: Payer: Self-pay | Admitting: Internal Medicine

## 2019-12-26 ENCOUNTER — Other Ambulatory Visit: Payer: Self-pay

## 2019-12-26 DIAGNOSIS — B20 Human immunodeficiency virus [HIV] disease: Secondary | ICD-10-CM

## 2019-12-26 MED ORDER — TIVICAY 50 MG PO TABS: 50.0000 mg | ORAL_TABLET | Freq: Every day | ORAL | 5 refills | Status: DC

## 2019-12-26 MED ORDER — ODEFSEY 200-25-25 MG PO TABS: 1.0000 | ORAL_TABLET | Freq: Every day | ORAL | 5 refills | Status: DC

## 2019-12-27 DIAGNOSIS — Z23 Encounter for immunization: Secondary | ICD-10-CM | POA: Diagnosis not present

## 2020-01-14 ENCOUNTER — Ambulatory Visit: Payer: BC Managed Care – PPO | Admitting: Podiatry

## 2020-03-01 DIAGNOSIS — E782 Mixed hyperlipidemia: Secondary | ICD-10-CM | POA: Diagnosis not present

## 2020-03-01 DIAGNOSIS — I5020 Unspecified systolic (congestive) heart failure: Secondary | ICD-10-CM | POA: Diagnosis not present

## 2020-03-01 DIAGNOSIS — R739 Hyperglycemia, unspecified: Secondary | ICD-10-CM | POA: Diagnosis not present

## 2020-03-01 DIAGNOSIS — I10 Essential (primary) hypertension: Secondary | ICD-10-CM | POA: Diagnosis not present

## 2020-03-01 DIAGNOSIS — E119 Type 2 diabetes mellitus without complications: Secondary | ICD-10-CM | POA: Diagnosis not present

## 2020-03-01 DIAGNOSIS — M25562 Pain in left knee: Secondary | ICD-10-CM | POA: Diagnosis not present

## 2020-03-01 DIAGNOSIS — C8190 Hodgkin lymphoma, unspecified, unspecified site: Secondary | ICD-10-CM | POA: Diagnosis not present

## 2020-03-09 ENCOUNTER — Ambulatory Visit: Payer: BC Managed Care – PPO | Admitting: Podiatry

## 2020-03-09 ENCOUNTER — Other Ambulatory Visit: Payer: Self-pay

## 2020-03-09 ENCOUNTER — Encounter: Payer: Self-pay | Admitting: Podiatry

## 2020-03-09 DIAGNOSIS — M2142 Flat foot [pes planus] (acquired), left foot: Secondary | ICD-10-CM

## 2020-03-09 DIAGNOSIS — B351 Tinea unguium: Secondary | ICD-10-CM

## 2020-03-09 DIAGNOSIS — M79675 Pain in left toe(s): Secondary | ICD-10-CM

## 2020-03-09 DIAGNOSIS — M79674 Pain in right toe(s): Secondary | ICD-10-CM

## 2020-03-09 DIAGNOSIS — E119 Type 2 diabetes mellitus without complications: Secondary | ICD-10-CM

## 2020-03-09 DIAGNOSIS — M2141 Flat foot [pes planus] (acquired), right foot: Secondary | ICD-10-CM

## 2020-03-09 NOTE — Progress Notes (Signed)
Subjective: Holly Stephens presents today for preventative diabetic foot care and painful thick toenails that are difficult to trim. Pain interferes with ambulation. Aggravating factors include wearing enclosed shoe gear. Pain is relieved with periodic professional debridement.    She states her toenails are very long. She missed her last visit due to rescheduling of her November visit by our office.  She voices no new pedal problems on today's visit.  PCP is Dr. Allyn Kenner.  Past Medical History:  Diagnosis Date  . Acute deep vein thrombosis (DVT) of popliteal vein of left lower extremity ([place]) 02/09/2016  . Arthritis    osteoarthritis  . Diabetes mellitus without complication ([place])   . History of radiation therapy 04/25/2016 - 05/23/2016   Left Axilla treated to 36 Gy in 20 fractions  . HIV infection ([place])   . 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 Human immunodeficiency virus (HIV) disease ([place]) 02/23/2006   Qualifier: Diagnosis of  By: Megan Salon MD, John      Patient Active Problem List   Diagnosis Date Noted  . Routine screening for STI (sexually transmitted infection) 11/11/2019  . Screening for cervical cancer 11/11/2019  . Pain in left knee 02/19/2018  . 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 Acute deep vein thrombosis (DVT) of popliteal vein of left lower extremity ([place]) 02/09/2016  . Congestive heart failure with cardiomyopathy ([place]) 10/27/2015  . Acute congestive heart failure ([place]) 10/27/2015  . Hodgkin's disease, nodular sclerosis, of lymph nodes of multiple sites ([place]) 08/23/2015  . Visual floaters 09/12/2011  . Episodic low back pain 11/15/2010  . ROTATOR CUFF INJURY, RIGHT SHOULDER 11/03/2008  . ADHESIVE CAPSULITIS OF SHOULDER 11/12/2007  . OBESITY NOS 05/17/2006  . SYNDROME, CARPAL TUNNEL 05/17/2006  . FOOT PAIN 05/17/2006  . Human immunodeficiency virus (HIV) disease ([place]) 02/23/2006  . Hyperlipidemia  02/23/2006  . DEPRESSION 02/23/2006  . Essential hypertension 02/23/2006    Past Surgical History:  Procedure Laterality Date  . AXILLARY LYMPH NODE BIOPSY Left 07/23/2015   Procedure: EXCISIONAL BIOPSY LEFT AXILLARY LYMPH NODE;  Surgeon: [name], MD;  Location: [facility];  Service: General;  Laterality: Left;  . PORT-A-CATH REMOVAL N/A 01/09/2018   Procedure: MINOR REMOVAL PORT-A-CATH;  Surgeon: Aviva Signs, MD;  Location: [facility];  Service: General;  Laterality: N/A;  . PORTACATH PLACEMENT Right 09/08/2015   Procedure: INSERTION OF CENTRAL VENOUS CATHETER WITH PORT FOR CHEMOTHERAPY;  Surgeon: [name], MD;  Location: [facility];  Service: General;  Laterality: Right;  . SHOULDER SURGERY Right    removal of bone spur    Current Outpatient Medications on File Prior to Visit  Medication Sig Dispense Refill  . acetaminophen (TYLENOL) 650 MG CR tablet Take 1,300 mg by mouth every 8 (eight) hours as needed for pain.    Marland Kitchen ALPRAZolam (XANAX) 0.5 MG tablet Take 0.5 mg by mouth daily as needed.    . cholecalciferol (VITAMIN D) 1000 units tablet Take 1,000 Units by mouth daily.     . clotrimazole-betamethasone (LOTRISONE) cream For athlete's feet, apply to both feet and between toes bid x 4 weeks. 45 g 1  . dolutegravir (TIVICAY) 50 MG tablet Take 1 tablet (50 mg total) by mouth daily. Take with Odefsey. 30 tablet 5  . emtricitabine-rilpivir-tenofovir AF (ODEFSEY) 200-25-25 MG TABS tablet Take 1 tablet by mouth daily with breakfast. Take with Tivicay. 30 tablet 5  . fenofibrate 160 MG tablet Take 160 mg by  mouth daily.     . fexofenadine (ALLEGRA) 180 MG tablet Take 180 mg by mouth daily as needed for allergies.     . furosemide (LASIX) 20 MG tablet Take 1 tablet (20 mg total) by mouth daily. 90 tablet 2  . GLUCOSAMINE-CHONDROITIN PO Take 2 tablets by mouth daily.     Marland Kitchen ibuprofen (ADVIL) 800 MG tablet TAKE 1 TABLET BY MOUTH THREE TIMES DAILY FOR 3 DAYS THEN TAKE AS NEEDED (Patient not  taking: Reported on 11/11/2019)    . JARDIANCE 25 MG TABS tablet Take 25 mg by mouth daily.     . metFORMIN (GLUCOPHAGE-XR) 500 MG 24 hr tablet Take 500 mg by mouth 2 (two) times daily with a meal.     . methocarbamol (ROBAXIN) 750 MG tablet Take 750 mg by mouth as needed.     . metoprolol tartrate (LOPRESSOR) 25 MG tablet Take 1 tablet (25 mg total) by mouth daily. 30 tablet 0  . Multiple Vitamin (MULTIVITAMIN WITH MINERALS) TABS tablet Take 1 tablet by mouth daily.    Marland Kitchen REPATHA 140 MG/ML SOSY INJECT 1 SYRINGE INTO THE SKIN Q 2 WEEKS    . Semaglutide,0.25 or 0.5MG /DOS, (OZEMPIC, 0.25 OR 0.5 MG/DOSE,) 2 MG/1.5ML SOPN once a week.     . spironolactone (ALDACTONE) 25 MG tablet TAKE 1 TABLET DAILY (Patient taking differently: Take 25 mg by mouth daily. ) 90 tablet 3  . vitamin E 400 UNIT capsule Take 400 Units by mouth daily.     No current facility-administered medications on file prior to visit.     Allergies  Allergen Reactions  . Augmentin [Amoxicillin-Pot Clavulanate] Hives and Other (See Comments)    Has patient had a PCN reaction causing immediate rash, facial/tongue/throat swelling, SOB or lightheadedness with hypotension: No Has patient had a PCN reaction causing severe rash involving mucus membranes or skin necrosis: No Has patient had a PCN reaction that required hospitalization No Has patient had a PCN reaction occurring within the last 10 years: No If all of the above answers are "NO", then may proceed with Cephalosporin use.  . Bleomycin Cough  . Cefpodoxime Proxetil Itching and Rash    Social History   Occupational History  . Not on file  Tobacco Use  . Smoking status: Never Smoker  . Smokeless tobacco: Never Used  Vaping Use  . Vaping Use: Never used  Substance and Sexual Activity  . Alcohol use: No  . Drug use: No  . Sexual activity: Never    Comment: declined condoms    Family History  Problem Relation Age of Onset  . Hypertension Mother   . Cancer Mother 46        breast   . Hypertension Father   . Heart attack Father   . Pulmonary embolism Sister   . Parkinson's disease Brother     Immunization History  Administered Date(s) Administered  . H1N1 03/23/2008  . Hepatitis B 04/02/2001, 05/17/2001, 09/17/2001  . Influenza Split 11/15/2010, 12/05/2011  . Influenza Whole 11/15/2005, 11/16/2005, 11/20/2006, 11/12/2007, 12/03/2008, 11/29/2009  . Influenza,inj,Quad PF,6+ Mos 11/05/2012, 12/01/2015  . Influenza-Unspecified 11/19/2013, 11/09/2016, 12/04/2017  . Moderna Sars-Covid-2 Vaccination 05/08/2019, 06/06/2019  . Pneumococcal Polysaccharide-23 11/16/2005, 11/15/2010  . Tdap 11/05/2012     Objective: There were no vitals filed for this visit.  Holly Stephens is a pleasant 65 y.o. female, morbidly obese,  in NAD. AAO X 3.  Vascular Examination: Capillary fill time to digits <3 seconds b/l. Palpable DP pulses b/l. Palpable  PT pulses b/l. Pedal hair sparse b/l. Skin temperature gradient within normal limits b/l.  Dermatological Examination: Pedal skin with normal turgor, texture and tone bilaterally. No open wounds bilaterally. No interdigital macerations bilaterally. Toenails 1-5 b/l elongated, discolored, dystrophic, thickened, crumbly with subungual debris and tenderness to dorsal palpation. Resolved tinea pedis b/l.  Musculoskeletal Examination: Normal muscle strength 5/5 to all lower extremity muscle groups bilaterally. No pain crepitus or joint limitation noted with ROM b/l. Pes planus deformity noted b/l.   Neurological Examination: Protective sensation intact 5/5 intact bilaterally with 10g monofilament b/l. Vibratory sensation intact b/l.  Assessment: 1. Pain due to onychomycosis of toenails of both feet   2. Pes planus of both feet   3. Controlled type 2 diabetes mellitus without complication, without long-term current use of insulin ([place])     Plan: -Examined patient.  -Continue diabetic foot care principles. -Toenails  1-5 b/l were debrided in length and girth with sterile nail nippers and dremel without iatrogenic bleeding.  -Patient to continue soft, supportive shoe gear daily. -Patient to report any pedal injuries to medical professional immediately. -Patient/POA to call should there be question/concern in the interim.  Return in about 3 months (around 06/07/2020).

## 2020-03-12 DIAGNOSIS — E1169 Type 2 diabetes mellitus with other specified complication: Secondary | ICD-10-CM | POA: Diagnosis not present

## 2020-03-12 DIAGNOSIS — I10 Essential (primary) hypertension: Secondary | ICD-10-CM | POA: Diagnosis not present

## 2020-03-12 DIAGNOSIS — E782 Mixed hyperlipidemia: Secondary | ICD-10-CM | POA: Diagnosis not present

## 2020-03-12 DIAGNOSIS — R945 Abnormal results of liver function studies: Secondary | ICD-10-CM | POA: Diagnosis not present

## 2020-05-26 ENCOUNTER — Ambulatory Visit: Payer: BC Managed Care – PPO | Admitting: Internal Medicine

## 2020-05-26 ENCOUNTER — Other Ambulatory Visit: Payer: Self-pay

## 2020-05-26 DIAGNOSIS — B20 Human immunodeficiency virus [HIV] disease: Secondary | ICD-10-CM | POA: Diagnosis not present

## 2020-05-26 NOTE — Progress Notes (Signed)
Patient Active Problem List   Diagnosis Date Noted  . Human immunodeficiency virus (HIV) disease ([place]) 02/23/2006    Priority: High  . Routine screening for STI (sexually transmitted infection) 11/11/2019  . Screening for cervical cancer 11/11/2019  . Pain in left knee 02/19/2018  . 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 Acute deep vein thrombosis (DVT) of popliteal vein of left lower extremity ([place]) 02/09/2016  . Congestive heart failure with cardiomyopathy ([place]) 10/27/2015  . Acute congestive heart failure ([place]) 10/27/2015  . Hodgkin's disease, nodular sclerosis, of lymph nodes of multiple sites ([place]) 08/23/2015  . Visual floaters 09/12/2011  . Episodic low back pain 11/15/2010  . ROTATOR CUFF INJURY, RIGHT SHOULDER 11/03/2008  . ADHESIVE CAPSULITIS OF SHOULDER 11/12/2007  . OBESITY NOS 05/17/2006  . SYNDROME, CARPAL TUNNEL 05/17/2006  . FOOT PAIN 05/17/2006  . Hyperlipidemia 02/23/2006  . DEPRESSION 02/23/2006  . Essential hypertension 02/23/2006    Patient's Medications  New Prescriptions   No medications on file  Previous Medications   ACETAMINOPHEN (TYLENOL) 650 MG CR TABLET    Take 1,300 mg by mouth every 8 (eight) hours as needed for pain.   ALPRAZOLAM (XANAX) 0.5 MG TABLET    Take 0.5 mg by mouth daily as needed.   CHOLECALCIFEROL (VITAMIN D) 1000 UNITS TABLET    Take 1,000 Units by mouth daily.    CLOTRIMAZOLE-BETAMETHASONE (LOTRISONE) CREAM    For athlete's feet, apply to both feet and between toes bid x 4 weeks.   DOLUTEGRAVIR (TIVICAY) 50 MG TABLET    Take 1 tablet (50 mg total) by mouth daily. Take with Odefsey.   EMTRICITABINE-RILPIVIR-TENOFOVIR AF (ODEFSEY) 200-25-25 MG TABS TABLET    Take 1 tablet by mouth daily with breakfast. Take with Tivicay.   FENOFIBRATE 160 MG TABLET    Take 160 mg by mouth daily.    FEXOFENADINE (ALLEGRA) 180 MG TABLET    Take 180 mg by mouth daily as needed for allergies.     FUROSEMIDE (LASIX) 20 MG TABLET    Take 1 tablet (20 mg total) by mouth daily.   GLUCOSAMINE-CHONDROITIN PO    Take 2 tablets by mouth daily.    IBUPROFEN (ADVIL) 800 MG TABLET    TAKE 1 TABLET BY MOUTH THREE TIMES DAILY FOR 3 DAYS THEN TAKE AS NEEDED   JARDIANCE 25 MG TABS TABLET    Take 25 mg by mouth daily.    METFORMIN (GLUCOPHAGE-XR) 500 MG 24 HR TABLET    Take 500 mg by mouth 2 (two) times daily with a meal.    METHOCARBAMOL (ROBAXIN) 750 MG TABLET    Take 750 mg by mouth as needed.    METOPROLOL TARTRATE (LOPRESSOR) 25 MG TABLET    Take 1 tablet (25 mg total) by mouth daily.   MULTIPLE VITAMIN (MULTIVITAMIN WITH MINERALS) TABS TABLET    Take 1 tablet by mouth daily.   REPATHA 140 MG/ML SOSY    INJECT 1 SYRINGE INTO THE SKIN Q 2 WEEKS   SEMAGLUTIDE,0.25 OR 0.5MG /DOS, (OZEMPIC, 0.25 OR 0.5 MG/DOSE,) 2 MG/1.5ML SOPN    once a week.    SPIRONOLACTONE (ALDACTONE) 25 MG TABLET    TAKE 1 TABLET DAILY   VITAMIN E 400 UNIT CAPSULE    Take 400 Units by mouth daily.  Modified Medications   No medications on file  Discontinued Medications   No medications on file    Subjective:  Holly Stephens is in for her routine HIV follow-up visit.  She has not had any problems obtaining, taking or tolerating her Odefsey or Tivicay and does not recall missing doses.  She has had her first [place] booster.  She tells me that she is planning on working through 2023 before rolling her insurance over to [facility] in January 2024.  Review of Systems: Review of Systems  Constitutional: Negative for fever and weight loss.    Past Medical History:  Diagnosis Date  . Acute deep vein thrombosis (DVT) of popliteal vein of left lower extremity ([place]) 02/09/2016  . Arthritis    osteoarthritis  . Diabetes mellitus without complication ([place])   . History of radiation therapy 04/25/2016 - 05/23/2016   Left Axilla treated to 36 Gy in 20 fractions  . HIV infection ([place])   . [name], nodular sclerosis ([place])  08/23/2015  . Human immunodeficiency virus (HIV) disease ([place]) 02/23/2006   Qualifier: Diagnosis of  By: Megan Salon MD, Alexica Schlossberg      Social History   Tobacco Use  . Smoking status: Never Smoker  . Smokeless tobacco: Never Used  Vaping Use  . Vaping Use: Never used  Substance Use Topics  . Alcohol use: No  . Drug use: No    Family History  Problem Relation Age of Onset  . Hypertension Mother   . Cancer Mother 39       breast   . Hypertension Father   . Heart attack Father   . Pulmonary embolism Sister   . Parkinson's disease Brother     Allergies  Allergen Reactions  . Augmentin [Amoxicillin-Pot Clavulanate] Hives and Other (See Comments)    Has patient had a PCN reaction causing immediate rash, facial/tongue/throat swelling, SOB or lightheadedness with hypotension: No Has patient had a PCN reaction causing severe rash involving mucus membranes or skin necrosis: No Has patient had a PCN reaction that required hospitalization No Has patient had a PCN reaction occurring within the last 10 years: No If all of the above answers are "NO", then may proceed with Cephalosporin use.  . Bleomycin Cough  . Cefpodoxime Proxetil Itching and Rash    Health Maintenance  Topic Date Due  . HEMOGLOBIN A1C  Never done  . OPHTHALMOLOGY EXAM  Never done  . URINE MICROALBUMIN  Never done  . COLONOSCOPY (Pts 45-7yrs Insurance coverage will need to be confirmed)  Never done  . COVID-19 Vaccine (3 - Moderna risk 4-dose series) 07/04/2019  . FOOT EXAM  07/03/2020  . MAMMOGRAM  07/24/2021  . TETANUS/TDAP  11/06/2022  . PAP SMEAR-Modifier  11/11/2022  . Hepatitis C Screening  Completed  . HIV Screening  Completed  . HPV VACCINES  Aged Out    Objective:  Vitals:   05/26/20 0909  BP: 128/76  Pulse: 74  Temp: 98 F (36.7 C)  Weight: 248 lb (112.5 kg)  Height: 5' (1.524 m)   Body mass index is 48.43 kg/m.  Physical Exam Constitutional:      Comments: She is very pleasant as usual.   Cardiovascular:     Rate and Rhythm: Normal rate.  Pulmonary:     Effort: Pulmonary effort is normal.  Psychiatric:        Mood and Affect: Mood normal.     Lab Results Lab Results  Component Value Date   WBC 7.0 12/05/2019   HGB 17.0 (H) 12/05/2019   HCT 51.4 (H) 12/05/2019   MCV 94.1 12/05/2019   PLT 291 12/05/2019  Lab Results  Component Value Date   CREATININE 0.61 12/05/2019   BUN 17 12/05/2019   NA 138 12/05/2019   K 4.4 12/05/2019   CL 103 12/05/2019   CO2 28 12/05/2019    Lab Results  Component Value Date   ALT 33 12/05/2019   AST 28 12/05/2019   ALKPHOS 35 (L) 12/05/2019   BILITOT 0.7 12/05/2019    Lab Results  Component Value Date   CHOL 177 09/04/2019   HDL 48 (L) 09/04/2019   LDLCALC 98 09/04/2019   TRIG 223 (H) 09/04/2019   CHOLHDL 3.7 09/04/2019   Lab Results  Component Value Date   LABRPR NON-REACTIVE 09/04/2019   HIV 1 RNA Quant (copies/mL)  Date Value  09/04/2019 55 (H)  09/05/2018 <20 NOT DETECTED  09/06/2017 <20 NOT DETECTED   CD4 T Cell Abs (/uL)  Date Value  09/04/2019 984  09/05/2018 778  09/06/2017 790     Problem List Items Addressed This Visit      High   Human immunodeficiency virus (HIV) disease ([place])    Her adherence is perfect and her infection has been under very good long-term control. She will continue her current antiretroviral regimen, get repeat blood work today and follow-up in 1 year.  I recommended that she go ahead and get her second COVID booster.  She will meet with our financial counselor to make sure that she is prepared to go her insurance over to [facility] in January 2024.      Relevant Orders   T-helper cell (CD4)- ([facility] clinic only)   HIV-1 RNA quant-no reflex-bld   CBC   Comprehensive metabolic panel   RPR   Lipid panel   CBC   T-helper cell (CD4)- ([facility] clinic only)   Comprehensive metabolic panel   Lipid panel   RPR   HIV-1 RNA quant-no reflex-bld        Michel Bickers, MD [facility] for [place] [phone] pager   [phone] cell 05/26/2020, 9:29 AM

## 2020-05-26 NOTE — Assessment & Plan Note (Signed)
Her adherence is perfect and her infection has been under very good long-term control. She will continue her current antiretroviral regimen, get repeat blood work today and follow-up in 1 year.  I recommended that she go ahead and get her second COVID booster.  She will meet with our financial counselor to make sure that she is prepared to go her insurance over to [facility] in January 2024.

## 2020-05-27 LAB — T-HELPER CELL (CD4) - (RCID CLINIC ONLY)
CD4 % Helper T Cell: 40 % (ref 33–65)
CD4 T Cell Abs: 850 /uL (ref 400–1790)

## 2020-05-30 LAB — COMPREHENSIVE METABOLIC PANEL
AG Ratio: 1.6 (calc) (ref 1.0–2.5)
ALT: 32 U/L — ABNORMAL HIGH (ref 6–29)
AST: 25 U/L (ref 10–35)
Albumin: 4.6 g/dL (ref 3.6–5.1)
Alkaline phosphatase (APISO): 54 U/L (ref 37–153)
BUN: 19 mg/dL (ref 7–25)
CO2: 27 mmol/L (ref 20–32)
Calcium: 10.7 mg/dL — ABNORMAL HIGH (ref 8.6–10.4)
Chloride: 102 mmol/L (ref 98–110)
Creat: 0.63 mg/dL (ref 0.50–0.99)
Globulin: 2.8 g/dL (calc) (ref 1.9–3.7)
Glucose, Bld: 125 mg/dL — ABNORMAL HIGH (ref 65–99)
Potassium: 4.5 mmol/L (ref 3.5–5.3)
Sodium: 141 mmol/L (ref 135–146)
Total Bilirubin: 0.4 mg/dL (ref 0.2–1.2)
Total Protein: 7.4 g/dL (ref 6.1–8.1)

## 2020-05-30 LAB — RPR: RPR Ser Ql: NONREACTIVE

## 2020-05-30 LAB — LIPID PANEL
Cholesterol: 166 mg/dL (ref <200)
HDL: 40 mg/dL — ABNORMAL LOW (ref >=50)
Non-HDL Cholesterol (Calc): 126 mg/dL (calc) (ref <130)
Total CHOL/HDL Ratio: 4.2 (calc) (ref <5.0)
Triglycerides: 480 mg/dL — ABNORMAL HIGH (ref <150)

## 2020-05-30 LAB — CBC
HCT: 47.4 % — ABNORMAL HIGH (ref 35.0–45.0)
Hemoglobin: 16.2 g/dL — ABNORMAL HIGH (ref 11.7–15.5)
MCH: 30.9 pg (ref 27.0–33.0)
MCHC: 34.2 g/dL (ref 32.0–36.0)
MCV: 90.5 fL (ref 80.0–100.0)
MPV: 9.8 fL (ref 7.5–12.5)
Platelets: 251 10*3/uL (ref 140–400)
RBC: 5.24 10*6/uL — ABNORMAL HIGH (ref 3.80–5.10)
RDW: 12.7 % (ref 11.0–15.0)
WBC: 6.9 10*3/uL (ref 3.8–10.8)

## 2020-05-30 LAB — HIV-1 RNA QUANT-NO REFLEX-BLD
HIV 1 RNA Quant: NOT DETECTED Copies/mL
HIV-1 RNA Quant, Log: NOT DETECTED Log cps/mL

## 2020-06-02 ENCOUNTER — Ambulatory Visit: Payer: BC Managed Care – PPO | Admitting: Internal Medicine

## 2020-06-11 ENCOUNTER — Other Ambulatory Visit (HOSPITAL_COMMUNITY): Payer: Self-pay | Admitting: *Deleted

## 2020-06-11 DIAGNOSIS — C8118 Nodular sclerosis classical Hodgkin lymphoma, lymph nodes of multiple sites: Secondary | ICD-10-CM

## 2020-06-14 ENCOUNTER — Inpatient Hospital Stay (HOSPITAL_COMMUNITY): Payer: BC Managed Care – PPO | Attending: Hematology

## 2020-06-14 ENCOUNTER — Ambulatory Visit (HOSPITAL_COMMUNITY)
Admission: RE | Admit: 2020-06-14 | Discharge: 2020-06-14 | Disposition: A | Discharge status: Home / Self Care | Payer: BC Managed Care – PPO | Source: Ambulatory Visit | Attending: Oncology | Admitting: Oncology

## 2020-06-14 ENCOUNTER — Other Ambulatory Visit: Payer: Self-pay

## 2020-06-14 DIAGNOSIS — Z8269 Family history of other diseases of the musculoskeletal system and connective tissue: Secondary | ICD-10-CM | POA: Diagnosis not present

## 2020-06-14 DIAGNOSIS — R5383 Other fatigue: Secondary | ICD-10-CM | POA: Diagnosis not present

## 2020-06-14 DIAGNOSIS — Z836 Family history of other diseases of the respiratory system: Secondary | ICD-10-CM | POA: Insufficient documentation

## 2020-06-14 DIAGNOSIS — M47814 Spondylosis without myelopathy or radiculopathy, thoracic region: Secondary | ICD-10-CM | POA: Diagnosis not present

## 2020-06-14 DIAGNOSIS — E669 Obesity, unspecified: Secondary | ICD-10-CM | POA: Diagnosis not present

## 2020-06-14 DIAGNOSIS — Z79899 Other long term (current) drug therapy: Secondary | ICD-10-CM | POA: Diagnosis not present

## 2020-06-14 DIAGNOSIS — R200 Anesthesia of skin: Secondary | ICD-10-CM | POA: Insufficient documentation

## 2020-06-14 DIAGNOSIS — Z923 Personal history of irradiation: Secondary | ICD-10-CM | POA: Diagnosis not present

## 2020-06-14 DIAGNOSIS — M2550 Pain in unspecified joint: Secondary | ICD-10-CM | POA: Insufficient documentation

## 2020-06-14 DIAGNOSIS — Z881 Allergy status to other antibiotic agents status: Secondary | ICD-10-CM | POA: Insufficient documentation

## 2020-06-14 DIAGNOSIS — K5900 Constipation, unspecified: Secondary | ICD-10-CM | POA: Diagnosis not present

## 2020-06-14 DIAGNOSIS — Z803 Family history of malignant neoplasm of breast: Secondary | ICD-10-CM | POA: Insufficient documentation

## 2020-06-14 DIAGNOSIS — B20 Human immunodeficiency virus [HIV] disease: Secondary | ICD-10-CM | POA: Diagnosis not present

## 2020-06-14 DIAGNOSIS — C8118 Nodular sclerosis classical Hodgkin lymphoma, lymph nodes of multiple sites: Secondary | ICD-10-CM | POA: Diagnosis not present

## 2020-06-14 DIAGNOSIS — K429 Umbilical hernia without obstruction or gangrene: Secondary | ICD-10-CM | POA: Insufficient documentation

## 2020-06-14 DIAGNOSIS — G479 Sleep disorder, unspecified: Secondary | ICD-10-CM | POA: Insufficient documentation

## 2020-06-14 DIAGNOSIS — D3502 Benign neoplasm of left adrenal gland: Secondary | ICD-10-CM | POA: Diagnosis not present

## 2020-06-14 DIAGNOSIS — E279 Disorder of adrenal gland, unspecified: Secondary | ICD-10-CM | POA: Diagnosis not present

## 2020-06-14 DIAGNOSIS — Z8249 Family history of ischemic heart disease and other diseases of the circulatory system: Secondary | ICD-10-CM | POA: Insufficient documentation

## 2020-06-14 DIAGNOSIS — D751 Secondary polycythemia: Secondary | ICD-10-CM | POA: Diagnosis not present

## 2020-06-14 DIAGNOSIS — Z880 Allergy status to penicillin: Secondary | ICD-10-CM | POA: Diagnosis not present

## 2020-06-14 DIAGNOSIS — I2510 Atherosclerotic heart disease of native coronary artery without angina pectoris: Secondary | ICD-10-CM | POA: Diagnosis not present

## 2020-06-14 DIAGNOSIS — Z8571 Personal history of Hodgkin lymphoma: Secondary | ICD-10-CM | POA: Diagnosis not present

## 2020-06-14 LAB — COMPREHENSIVE METABOLIC PANEL
ALT: 36 U/L (ref 0–44)
AST: 30 U/L (ref 15–41)
Albumin: 4.5 g/dL (ref 3.5–5.0)
Alkaline Phosphatase: 47 U/L (ref 38–126)
Anion gap: 11 (ref 5–15)
BUN: 20 mg/dL (ref 8–23)
CO2: 26 mmol/L (ref 22–32)
Calcium: 10.7 mg/dL — ABNORMAL HIGH (ref 8.9–10.3)
Chloride: 103 mmol/L (ref 98–111)
Creatinine, Ser: 0.76 mg/dL (ref 0.44–1.00)
GFR, Estimated: >60 mL/min (ref >60)
Glucose, Bld: 127 mg/dL — ABNORMAL HIGH (ref 70–99)
Potassium: 4.6 mmol/L (ref 3.5–5.1)
Sodium: 140 mmol/L (ref 135–145)
Total Bilirubin: 0.5 mg/dL (ref 0.3–1.2)
Total Protein: 7.7 g/dL (ref 6.5–8.1)

## 2020-06-14 LAB — CBC WITH DIFFERENTIAL/PLATELET
Abs Immature Granulocytes: 0.05 10*3/uL (ref 0.00–0.07)
Basophils Absolute: 0 10*3/uL (ref 0.0–0.1)
Basophils Relative: 1 %
Eosinophils Absolute: 0.1 10*3/uL (ref 0.0–0.5)
Eosinophils Relative: 2 %
HCT: 52.3 % — ABNORMAL HIGH (ref 36.0–46.0)
Hemoglobin: 17 g/dL — ABNORMAL HIGH (ref 12.0–15.0)
Immature Granulocytes: 1 %
Lymphocytes Relative: 32 %
Lymphs Abs: 2.5 10*3/uL (ref 0.7–4.0)
MCH: 31.1 pg (ref 26.0–34.0)
MCHC: 32.5 g/dL (ref 30.0–36.0)
MCV: 95.6 fL (ref 80.0–100.0)
Monocytes Absolute: 0.5 10*3/uL (ref 0.1–1.0)
Monocytes Relative: 7 %
Neutro Abs: 4.6 10*3/uL (ref 1.7–7.7)
Neutrophils Relative %: 57 %
Platelets: 262 10*3/uL (ref 150–400)
RBC: 5.47 MIL/uL — ABNORMAL HIGH (ref 3.87–5.11)
RDW: 13.2 % (ref 11.5–15.5)
WBC: 7.8 10*3/uL (ref 4.0–10.5)
nRBC: 0 % (ref 0.0–0.2)

## 2020-06-14 LAB — LACTATE DEHYDROGENASE: LDH: 122 U/L (ref 98–192)

## 2020-06-14 LAB — VITAMIN D 25 HYDROXY (VIT D DEFICIENCY, FRACTURES): Vit D, 25-Hydroxy: 27.05 ng/mL — ABNORMAL LOW (ref 30–100)

## 2020-06-14 LAB — VITAMIN B12: Vitamin B-12: 574 pg/mL (ref 180–914)

## 2020-06-14 MED ORDER — IOHEXOL 300 MG/ML  SOLN
100.0000 mL | Freq: Once | INTRAMUSCULAR | Status: AC | PRN
  Administered 2020-06-14: 100 mL via INTRAVENOUS

## 2020-06-17 ENCOUNTER — Inpatient Hospital Stay (HOSPITAL_COMMUNITY): Payer: BC Managed Care – PPO | Admitting: Hematology

## 2020-06-17 ENCOUNTER — Other Ambulatory Visit: Payer: Self-pay

## 2020-06-17 VITALS — BP 128/71 | HR 96 | Temp 96.9°F | Resp 20 | Wt 245.8 lb

## 2020-06-17 DIAGNOSIS — Z79899 Other long term (current) drug therapy: Secondary | ICD-10-CM | POA: Diagnosis not present

## 2020-06-17 DIAGNOSIS — K429 Umbilical hernia without obstruction or gangrene: Secondary | ICD-10-CM | POA: Diagnosis not present

## 2020-06-17 DIAGNOSIS — C8118 Nodular sclerosis classical Hodgkin lymphoma, lymph nodes of multiple sites: Secondary | ICD-10-CM

## 2020-06-17 DIAGNOSIS — D3502 Benign neoplasm of left adrenal gland: Secondary | ICD-10-CM | POA: Diagnosis not present

## 2020-06-17 DIAGNOSIS — D751 Secondary polycythemia: Secondary | ICD-10-CM | POA: Diagnosis not present

## 2020-06-17 DIAGNOSIS — I2510 Atherosclerotic heart disease of native coronary artery without angina pectoris: Secondary | ICD-10-CM | POA: Diagnosis not present

## 2020-06-17 DIAGNOSIS — B20 Human immunodeficiency virus [HIV] disease: Secondary | ICD-10-CM | POA: Diagnosis not present

## 2020-06-17 DIAGNOSIS — M2550 Pain in unspecified joint: Secondary | ICD-10-CM | POA: Diagnosis not present

## 2020-06-17 DIAGNOSIS — R5383 Other fatigue: Secondary | ICD-10-CM | POA: Diagnosis not present

## 2020-06-17 DIAGNOSIS — M47814 Spondylosis without myelopathy or radiculopathy, thoracic region: Secondary | ICD-10-CM | POA: Diagnosis not present

## 2020-06-17 DIAGNOSIS — K5900 Constipation, unspecified: Secondary | ICD-10-CM | POA: Diagnosis not present

## 2020-06-17 DIAGNOSIS — Z923 Personal history of irradiation: Secondary | ICD-10-CM | POA: Diagnosis not present

## 2020-06-17 DIAGNOSIS — G479 Sleep disorder, unspecified: Secondary | ICD-10-CM | POA: Diagnosis not present

## 2020-06-17 DIAGNOSIS — E669 Obesity, unspecified: Secondary | ICD-10-CM | POA: Diagnosis not present

## 2020-06-17 DIAGNOSIS — R200 Anesthesia of skin: Secondary | ICD-10-CM | POA: Diagnosis not present

## 2020-06-17 NOTE — Progress Notes (Signed)
Holly Stephens [address], [place] [address]   CLINIC:  Medical Oncology/Hematology  PCP:  Holly Squibb, MD [address] [place] [address]  [phone]  REASON FOR VISIT:  Follow-up for Hodgkin's lymphoma, nodular-sclerosing type  PRIOR THERAPY:  1.  ABVD from 09/13/2015 to 02/23/2016. 2. XRT to left axilla through 05/23/2016.  CURRENT THERAPY: Surveillance  INTERVAL HISTORY:  Holly Stephens, a 65 y.o. female, returns for routine follow-up for her Hodgkin's lymphoma, nodular-sclerosing type. Holly Stephens was last seen by Faythe Casa, NP, on 12/15/2019.  Today she reports feeling okay. She denies having any recent infections, F/C, night sweats, leg swelling or weight loss. Her primary lymphoma was present in her left axillary lymph nodes. Her only pain is arthritis in her joints; she takes Tylenol ES PRN. She takes vitamin D 1,000 units, Lasix and spironolactone daily. She takes Xanax for sleep PRN.  Her next appointment with Dr. Nevada Crane is in late May.  REVIEW OF SYSTEMS:  Review of Systems  Constitutional: Positive for fatigue (50%). Negative for appetite change, chills, diaphoresis, fever and unexpected weight change.  Gastrointestinal: Positive for constipation.  Musculoskeletal: Positive for arthralgias (5/10 hip & leg pain d/t arthritis).  Neurological: Positive for numbness (tingling in toes).  Psychiatric/Behavioral: Positive for sleep disturbance (on Xanax).  All other systems reviewed and are negative.   PAST MEDICAL/SURGICAL HISTORY:  Past Medical History:  Diagnosis Date  . Acute deep vein thrombosis (DVT) of popliteal vein of left lower extremity ([facility]) 02/09/2016  . Arthritis    osteoarthritis  . Diabetes mellitus without complication ([facility])   . History of radiation therapy 04/25/2016 - 05/23/2016   Left Axilla treated to 36 Gy in 20 fractions  . HIV infection ([facility])   . [name], nodular sclerosis ([facility]) 08/23/2015  .  Human immunodeficiency virus (HIV) disease ([facility]) 02/23/2006   Qualifier: Diagnosis of  By: Megan Salon MD, John     Past Surgical History:  Procedure Laterality Date  . AXILLARY LYMPH NODE BIOPSY Left 07/23/2015   Procedure: EXCISIONAL BIOPSY LEFT AXILLARY LYMPH NODE;  Surgeon: [name], MD;  Location: [facility];  Service: General;  Laterality: Left;  . PORT-A-CATH REMOVAL N/A 01/09/2018   Procedure: MINOR REMOVAL PORT-A-CATH;  Surgeon: Aviva Signs, MD;  Location: [facility];  Service: General;  Laterality: N/A;  . PORTACATH PLACEMENT Right 09/08/2015   Procedure: INSERTION OF CENTRAL VENOUS CATHETER WITH PORT FOR CHEMOTHERAPY;  Surgeon: [name], MD;  Location: [facility];  Service: General;  Laterality: Right;  . SHOULDER SURGERY Right    removal of bone spur    SOCIAL HISTORY:  Social History   Socioeconomic History  . Marital status: Widowed    Spouse name: Not on file  . Number of children: Not on file  . Years of education: Not on file  . Highest education level: Not on file  Occupational History  . Not on file  Tobacco Use  . Smoking status: Never Smoker  . Smokeless tobacco: Never Used  Vaping Use  . Vaping Use: Never used  Substance and Sexual Activity  . Alcohol use: No  . Drug use: No  . Sexual activity: Never    Comment: declined condoms  Other Topics Concern  . Not on file  Social History Narrative  . Not on file   Social Determinants of Health   Financial Resource Strain: Not on file  Food Insecurity: Not on file  Transportation Needs:  Not on file  Physical Activity: Not on file  Stress: Not on file  Social Connections: Not on file  Intimate Partner Violence: Not At Risk  . Fear of Current or Ex-Partner: No  . Emotionally Abused: No  . Physically Abused: No  . Sexually Abused: No    FAMILY HISTORY:  Family History  Problem Relation Age of Onset  . Hypertension Mother   . Cancer Mother 71       breast   . Hypertension Father   . Heart attack  Father   . Pulmonary embolism Sister   . Parkinson's disease Brother     CURRENT MEDICATIONS:  Current Outpatient Medications  Medication Sig Dispense Refill  . acetaminophen (TYLENOL) 650 MG CR tablet Take 1,300 mg by mouth every 8 (eight) hours as needed for pain.    Marland Kitchen ALPRAZolam (XANAX) 0.5 MG tablet Take 0.5 mg by mouth daily as needed.    . cholecalciferol (VITAMIN D) 1000 units tablet Take 1,000 Units by mouth daily.     . clotrimazole-betamethasone (LOTRISONE) cream For athlete's feet, apply to both feet and between toes bid x 4 weeks. 45 g 1  . dolutegravir (TIVICAY) 50 MG tablet Take 1 tablet (50 mg total) by mouth daily. Take with Odefsey. 30 tablet 5  . emtricitabine-rilpivir-tenofovir AF (ODEFSEY) 200-25-25 MG TABS tablet Take 1 tablet by mouth daily with breakfast. Take with Tivicay. 30 tablet 5  . fenofibrate 160 MG tablet Take 160 mg by mouth daily.     . fexofenadine (ALLEGRA) 180 MG tablet Take 180 mg by mouth daily as needed for allergies.     . furosemide (LASIX) 20 MG tablet Take 1 tablet (20 mg total) by mouth daily. 90 tablet 2  . GLUCOSAMINE-CHONDROITIN PO Take 2 tablets by mouth daily.     Marland Kitchen ibuprofen (ADVIL) 800 MG tablet TAKE 1 TABLET BY MOUTH THREE TIMES DAILY FOR 3 DAYS THEN TAKE AS NEEDED    . JARDIANCE 25 MG TABS tablet Take 25 mg by mouth daily.     . metFORMIN (GLUCOPHAGE-XR) 500 MG 24 hr tablet Take 500 mg by mouth 2 (two) times daily with a meal.     . methocarbamol (ROBAXIN) 750 MG tablet Take 750 mg by mouth as needed.     . metoprolol tartrate (LOPRESSOR) 25 MG tablet Take 1 tablet (25 mg total) by mouth daily. 30 tablet 0  . Multiple Vitamin (MULTIVITAMIN WITH MINERALS) TABS tablet Take 1 tablet by mouth daily.    Marland Kitchen REPATHA 140 MG/ML SOSY INJECT 1 SYRINGE INTO THE SKIN Q 2 WEEKS    . Semaglutide,0.25 or 0.5MG /DOS, (OZEMPIC, 0.25 OR 0.5 MG/DOSE,) 2 MG/1.5ML SOPN once a week.     . spironolactone (ALDACTONE) 25 MG tablet TAKE 1 TABLET DAILY (Patient  taking differently: Take 25 mg by mouth daily.) 90 tablet 3  . vitamin E 400 UNIT capsule Take 400 Units by mouth daily.     No current facility-administered medications for this visit.    ALLERGIES:  Allergies  Allergen Reactions  . Augmentin [Amoxicillin-Pot Clavulanate] Hives and Other (See Comments)    Has patient had a PCN reaction causing immediate rash, facial/tongue/throat swelling, SOB or lightheadedness with hypotension: No Has patient had a PCN reaction causing severe rash involving mucus membranes or skin necrosis: No Has patient had a PCN reaction that required hospitalization No Has patient had a PCN reaction occurring within the last 10 years: No If all of the above answers are "NO", then  may proceed with Cephalosporin use.  . Bleomycin Cough  . Cefpodoxime Proxetil Itching and Rash    PHYSICAL EXAM:  Performance status (ECOG): 1 - Symptomatic but completely ambulatory  Vitals:   06/17/20 1512  BP: 128/71  Pulse: 96  Resp: 20  Temp: (!) 96.9 F (36.1 C)  SpO2: 95%   Wt Readings from Last 3 Encounters:  06/17/20 245 lb 12.8 oz (111.5 kg)  05/26/20 248 lb (112.5 kg)  12/15/19 253 lb 9.6 oz (115 kg)   Physical Exam Vitals reviewed.  Constitutional:      Appearance: Normal appearance. She is obese.  Cardiovascular:     Rate and Rhythm: Normal rate and regular rhythm.     Pulses: Normal pulses.     Heart sounds: Normal heart sounds.  Pulmonary:     Effort: Pulmonary effort is normal.     Breath sounds: Normal breath sounds.  Chest:  Breasts:     Right: No axillary adenopathy or supraclavicular adenopathy.     Left: No axillary adenopathy or supraclavicular adenopathy.    Musculoskeletal:     Right lower leg: No edema.     Left lower leg: No edema.  Lymphadenopathy:     Cervical: No cervical adenopathy.     Upper Body:     Right upper body: No supraclavicular, axillary or pectoral adenopathy.     Left upper body: No supraclavicular, axillary or  pectoral adenopathy.  Neurological:     General: No focal deficit present.     Mental Status: She is alert and oriented to person, place, and time.  Psychiatric:        Mood and Affect: Mood normal.        Behavior: Behavior normal.     LABORATORY DATA:  I have reviewed the labs as listed.  CBC Latest Ref Rng & Units 06/14/2020 05/26/2020 12/05/2019  WBC 4.0 - 10.5 K/uL 7.8 6.9 7.0  Hemoglobin 12.0 - 15.0 g/dL 17.0(H) 16.2(H) 17.0(H)  Hematocrit 36.0 - 46.0 % 52.3(H) 47.4(H) 51.4(H)  Platelets 150 - 400 K/uL 262 251 291   CMP Latest Ref Rng & Units 06/14/2020 05/26/2020 12/05/2019  Glucose 70 - 99 mg/dL 127(H) 125(H) 159(H)  BUN 8 - 23 mg/dL 20 19 17   Creatinine 0.44 - 1.00 mg/dL 0.76 0.63 0.61  Sodium 135 - 145 mmol/L 140 141 138  Potassium 3.5 - 5.1 mmol/L 4.6 4.5 4.4  Chloride 98 - 111 mmol/L 103 102 103  CO2 22 - 32 mmol/L 26 27 28   Calcium 8.9 - 10.3 mg/dL 10.7(H) 10.7(H) 10.5(H)  Total Protein 6.5 - 8.1 g/dL 7.7 7.4 8.1  Total Bilirubin 0.3 - 1.2 mg/dL 0.5 0.4 0.7  Alkaline Phos 38 - 126 U/L 47 - 35(L)  AST 15 - 41 U/L 30 25 28   ALT 0 - 44 U/L 36 32(H) 33      Component Value Date/Time   RBC 5.47 (H) 06/14/2020 1000   MCV 95.6 06/14/2020 1000   MCH 31.1 06/14/2020 1000   MCHC 32.5 06/14/2020 1000   RDW 13.2 06/14/2020 1000   LYMPHSABS 2.5 06/14/2020 1000   MONOABS 0.5 06/14/2020 1000   EOSABS 0.1 06/14/2020 1000   BASOSABS 0.0 06/14/2020 1000   Lab Results  Component Value Date   LDH 122 06/14/2020   LDH 120 12/05/2019   LDH 115 06/03/2019   Lab Results  Component Value Date   VD25OH 27.05 (L) 06/14/2020   VD25OH 28.38 (L) 12/05/2019    DIAGNOSTIC IMAGING:  I have independently reviewed the scans and discussed with the patient. CT CHEST ABDOMEN PELVIS W CONTRAST  Result Date: 06/14/2020 CLINICAL DATA:  Restaging nodular sclerosing type Hodgkin's lymphoma. History of HIV. EXAM: CT CHEST, ABDOMEN, AND PELVIS WITH CONTRAST TECHNIQUE: Multidetector CT imaging  of the chest, abdomen and pelvis was performed following the standard protocol during bolus administration of intravenous contrast. CONTRAST:  170mL OMNIPAQUE IOHEXOL 300 MG/ML  SOLN COMPARISON:  06/03/2019 FINDINGS: CT CHEST FINDINGS Cardiovascular: Coronary, aortic arch, and branch vessel atherosclerotic vascular disease. Mediastinum/Nodes: Left axillary clips noted. Small axillary nodes are present, largest 0.7 cm in short axis on image 12 series 2 (stable from 06/03/2019). No pathologic adenopathy in the chest. Lungs/Pleura: 5 by 3 mm right lower lobe pulmonary nodule on image 78 series 3 common no change from 06/18/2018, appearance compatible with benign subpleural lymph node. Mild scarring in the lingula. Stable 3 mm subpleural nodule in the left lower lobe, no change from 06/18/2018, considered benign. Musculoskeletal: Thoracic spondylosis with multilevel bridging spurring. CT ABDOMEN PELVIS FINDINGS Hepatobiliary: Unremarkable Pancreas: Unremarkable Spleen: Unremarkable Adrenals/Urinary Tract: Stable 2.0 by 1.6 cm left adrenal mass, relative washout 46%, consistent with adenoma. The kidneys appear unremarkable. Stomach/Bowel: Umbilical hernia contains a margin of the transverse colon along with adipose tissue, no strangulation or complicating feature. Vascular/Lymphatic: Aortoiliac atherosclerotic vascular disease. Reproductive: Unremarkable Other: No supplemental non-categorized findings. Musculoskeletal: Broad umbilical hernia with herniated adipose tissue measuring about 11.8 by 11.8 by 9.4 cm (volume = 690 cm^3), with a small margin of the transverse colon extending into the hernia. The hernia neck measures 3.4 by 5.3 cm. Lax anterior abdominal wall. Degenerative hip arthropathy bilaterally. IMPRESSION: 1. No findings of active lymphoma. 2. Other imaging findings of potential clinical significance: Aortic Atherosclerosis (ICD10-I70.0). Coronary atherosclerosis. Thoracic spondylosis. Broad umbilical  hernia contains adipose tissue and a small margin of the transverse colon without complicating feature. Stable left adrenal adenoma. Electronically Signed   By: Van Clines M.D.   On: 06/14/2020 15:00     ASSESSMENT:  1.  Hodgkin's Lymphoma, nodular-sclerosing type involving multiple lymph nodes: -Left axilla pathology : CD30, CD15, and PAX-5 positive. -ABVD chemotherapy beginning on 09/13/2015, with discontinuation of Bleomycin on 10/11/2015. -Mid-course PET scan revealed Deauville 3 lymphadenopathy, so she received 4 additional cycles of AVD. -Treatment course was complicated by diastolic heart failure. -Chemotherapy completed on 02/23/2016. -Post-treatment PET revealed mild residual Left axillary adenopathy with a Deauville 2, so she was referred for axillary radiation therapy. -Completed XRT to left axilla on 05/23/16. -repeat PET on 07/2016 showed mild residual low level metabolic activity within left axillary lymph nodes a similar to prior deauville 2.  2.  HIV: -She continues to follow up with Infectious disease.  3.  Chemo induced heart failure: -She continues to follow up with cardiology.    PLAN:  1.  Hodgkin's Lymphoma, nodular-sclerosing type involving multiple lymph nodes: - She does not have any B symptoms.  No palpable adenopathy. - Reviewed CT from 06/14/2020 which did not show any evidence of active lymphoma. - Reviewed labs which showed normal LDH and LFTs. - RTC 6 months for follow-up.  We will likely do a last scan in 1 year.  2.  Mild hypercalcemia: - Her calcium is 10.7 for the last 2 times. - She does not take any calcium supplements although she takes vitamin D. - We will check PTH levels at next visit.  3.  Erythrocytosis: - She has a hemoglobin elevated at 17 for the past few  times.  Hematocrit is 52.3. - We will check JAK2 [id number] mutation.     Orders placed this encounter:  No orders of the defined types were placed in this  encounter.    Derek Jack, MD [facility] [phone]   I, Milinda Antis, am acting as a scribe for Dr. Sanda Linger.  I, Derek Jack MD, have reviewed the above documentation for accuracy and completeness, and I agree with the above.

## 2020-06-17 NOTE — Patient Instructions (Signed)
[facility] at [facility] Discharge Instructions  You were seen today by Dr. Delton Coombes. He went over your recent results and scans. You will get annual scans to track your lymphoma. Your next appointment will be with the physician assistant in 6 months for labs and follow up.   Thank you for choosing [facility] at [facility] to provide your oncology and hematology care.  To afford each patient quality time with our provider, please arrive at least 15 minutes before your scheduled appointment time.   If you have a lab appointment with the [facility] please come in thru the Main Entrance and check in at the main information desk  You need to re-schedule your appointment should you arrive 10 or more minutes late.  We strive to give you quality time with our providers, and arriving late affects you and other patients whose appointments are after yours.  Also, if you no show three or more times for appointments you may be dismissed from the clinic at the providers discretion.     Again, thank you for choosing [facility].  Our hope is that these requests will decrease the amount of time that you wait before being seen by our physicians.       _____________________________________________________________  Should you have questions after your visit to [facility], please contact our office at [phone] between the hours of 8:00 a.m. and 4:30 p.m.  Voicemails left after 4:00 p.m. will not be returned until the following business day.  For prescription refill requests, have your pharmacy contact our office and allow 72 hours.    Cancer Center Support Programs:   > Cancer Support Group  2nd Tuesday of the month 1pm-2pm, Journey Room

## 2020-06-18 ENCOUNTER — Other Ambulatory Visit: Payer: Self-pay

## 2020-06-18 DIAGNOSIS — B20 Human immunodeficiency virus [HIV] disease: Secondary | ICD-10-CM

## 2020-06-18 MED ORDER — TIVICAY 50 MG PO TABS: 50.0000 mg | ORAL_TABLET | Freq: Every day | ORAL | 5 refills | Status: DC

## 2020-06-18 MED ORDER — ODEFSEY 200-25-25 MG PO TABS: 1.0000 | ORAL_TABLET | Freq: Every day | ORAL | 5 refills | Status: DC

## 2020-06-25 ENCOUNTER — Ambulatory Visit: Payer: BC Managed Care – PPO | Admitting: Podiatry

## 2020-06-25 ENCOUNTER — Encounter: Payer: Self-pay | Admitting: Podiatry

## 2020-06-25 ENCOUNTER — Other Ambulatory Visit: Payer: Self-pay

## 2020-06-25 DIAGNOSIS — E119 Type 2 diabetes mellitus without complications: Secondary | ICD-10-CM

## 2020-06-25 DIAGNOSIS — M79674 Pain in right toe(s): Secondary | ICD-10-CM | POA: Diagnosis not present

## 2020-06-25 DIAGNOSIS — M79675 Pain in left toe(s): Secondary | ICD-10-CM

## 2020-06-25 DIAGNOSIS — M2142 Flat foot [pes planus] (acquired), left foot: Secondary | ICD-10-CM

## 2020-06-25 DIAGNOSIS — M2141 Flat foot [pes planus] (acquired), right foot: Secondary | ICD-10-CM

## 2020-06-25 DIAGNOSIS — B351 Tinea unguium: Secondary | ICD-10-CM | POA: Diagnosis not present

## 2020-07-02 ENCOUNTER — Other Ambulatory Visit: Payer: Self-pay

## 2020-07-02 ENCOUNTER — Ambulatory Visit: Payer: BC Managed Care – PPO

## 2020-07-03 NOTE — Progress Notes (Signed)
Subjective: Holly Stephens is a pleasant 65 y.o. female patient seen today for preventative diabetic foot care with h/o painful thick toenails that are difficult to trim. Pain interferes with ambulation. Aggravating factors include wearing enclosed shoe gear. Pain is relieved with periodic professional debridement.  She voices no new problems on today's visit. Blood glucose was 125 mg/dl on yesterday.   PCP is Dr. Allyn Kenner and last visit was about six months ago.  Allergies  Allergen Reactions  . Augmentin [Amoxicillin-Pot Clavulanate] Hives and Other (See Comments)    Has patient had a PCN reaction causing immediate rash, facial/tongue/throat swelling, SOB or lightheadedness with hypotension: No Has patient had a PCN reaction causing severe rash involving mucus membranes or skin necrosis: No Has patient had a PCN reaction that required hospitalization No Has patient had a PCN reaction occurring within the last 10 years: No If all of the above answers are "NO", then may proceed with Cephalosporin use.  . Bleomycin Cough  . Cefpodoxime Proxetil Itching and Rash    Objective: Physical Exam  General: Holly Stephens is a pleasant 65 y.o. Caucasian female, morbidly obese in NAD. AAO x 3.   Vascular:  Capillary refill time to digits immediate b/l. Palpable pedal pulses b/l LE. Pedal hair sparse b/l lower extremities. Lower extremity skin temperature gradient within normal limits. No pain with calf compression b/l. No edema noted b/l lower extremities.   Dermatological:  Pedal skin with normal turgor, texture and tone bilaterally. No open wounds bilaterally. No interdigital macerations bilaterally. Toenails 1-5 b/l elongated, discolored, dystrophic, thickened, crumbly with subungual debris and tenderness to dorsal palpation. No hyperkeratotic nor porokeratotic lesions present on today's visit.  Musculoskeletal:  Normal muscle strength 5/5 to all lower extremity muscle groups bilaterally.  No pain crepitus or joint limitation noted with ROM b/l. Pes planus deformity noted b/l.   Neurological:  Protective sensation intact 5/5 intact bilaterally with 10g monofilament b/l. Vibratory sensation intact b/l. Clonus negative b/l.  Assessment and Plan:  No diagnosis found.   -Examined patient. -Patient to continue soft, supportive shoe gear daily. -Toenails 1-5 b/l were debrided in length and girth with sterile nail nippers and dremel without iatrogenic bleeding.  -Patient to report any pedal injuries to medical professional immediately. -Patient/POA to call should there be question/concern in the interim.  Return in about 3 months (around 09/25/2020).  Marzetta Board, DPM

## 2020-07-09 DIAGNOSIS — I10 Essential (primary) hypertension: Secondary | ICD-10-CM | POA: Diagnosis not present

## 2020-07-09 DIAGNOSIS — C8190 Hodgkin lymphoma, unspecified, unspecified site: Secondary | ICD-10-CM | POA: Diagnosis not present

## 2020-07-09 DIAGNOSIS — M25562 Pain in left knee: Secondary | ICD-10-CM | POA: Diagnosis not present

## 2020-07-09 DIAGNOSIS — E119 Type 2 diabetes mellitus without complications: Secondary | ICD-10-CM | POA: Diagnosis not present

## 2020-07-09 DIAGNOSIS — E782 Mixed hyperlipidemia: Secondary | ICD-10-CM | POA: Diagnosis not present

## 2020-07-09 DIAGNOSIS — I5020 Unspecified systolic (congestive) heart failure: Secondary | ICD-10-CM | POA: Diagnosis not present

## 2020-07-09 DIAGNOSIS — R739 Hyperglycemia, unspecified: Secondary | ICD-10-CM | POA: Diagnosis not present

## 2020-07-15 DIAGNOSIS — C866 Primary cutaneous CD30-positive T-cell proliferations: Secondary | ICD-10-CM | POA: Insufficient documentation

## 2020-07-15 DIAGNOSIS — E119 Type 2 diabetes mellitus without complications: Secondary | ICD-10-CM | POA: Insufficient documentation

## 2020-07-15 DIAGNOSIS — D751 Secondary polycythemia: Secondary | ICD-10-CM | POA: Insufficient documentation

## 2020-07-15 DIAGNOSIS — M1990 Unspecified osteoarthritis, unspecified site: Secondary | ICD-10-CM | POA: Insufficient documentation

## 2020-07-15 DIAGNOSIS — G4700 Insomnia, unspecified: Secondary | ICD-10-CM | POA: Insufficient documentation

## 2020-07-16 ENCOUNTER — Other Ambulatory Visit (HOSPITAL_COMMUNITY): Payer: Self-pay | Admitting: Family Medicine

## 2020-07-16 DIAGNOSIS — M62830 Muscle spasm of back: Secondary | ICD-10-CM | POA: Insufficient documentation

## 2020-07-16 DIAGNOSIS — D751 Secondary polycythemia: Secondary | ICD-10-CM | POA: Diagnosis not present

## 2020-07-16 DIAGNOSIS — E119 Type 2 diabetes mellitus without complications: Secondary | ICD-10-CM | POA: Diagnosis not present

## 2020-07-16 DIAGNOSIS — Z13820 Encounter for screening for osteoporosis: Secondary | ICD-10-CM

## 2020-07-16 DIAGNOSIS — E785 Hyperlipidemia, unspecified: Secondary | ICD-10-CM | POA: Diagnosis not present

## 2020-07-16 DIAGNOSIS — I10 Essential (primary) hypertension: Secondary | ICD-10-CM | POA: Diagnosis not present

## 2020-07-23 ENCOUNTER — Ambulatory Visit (HOSPITAL_COMMUNITY)
Admission: RE | Admit: 2020-07-23 | Discharge: 2020-07-23 | Disposition: A | Discharge status: Home / Self Care | Payer: BC Managed Care – PPO | Source: Ambulatory Visit | Attending: Family Medicine | Admitting: Family Medicine

## 2020-07-23 ENCOUNTER — Other Ambulatory Visit: Payer: Self-pay

## 2020-07-23 DIAGNOSIS — M85852 Other specified disorders of bone density and structure, left thigh: Secondary | ICD-10-CM | POA: Diagnosis not present

## 2020-07-23 DIAGNOSIS — Z13820 Encounter for screening for osteoporosis: Secondary | ICD-10-CM | POA: Diagnosis not present

## 2020-07-23 DIAGNOSIS — Z780 Asymptomatic menopausal state: Secondary | ICD-10-CM | POA: Diagnosis not present

## 2020-07-23 DIAGNOSIS — M85832 Other specified disorders of bone density and structure, left forearm: Secondary | ICD-10-CM | POA: Diagnosis not present

## 2020-09-24 ENCOUNTER — Encounter: Payer: Self-pay | Admitting: Podiatry

## 2020-09-24 ENCOUNTER — Ambulatory Visit: Payer: BC Managed Care – PPO | Admitting: Podiatry

## 2020-09-24 ENCOUNTER — Other Ambulatory Visit: Payer: Self-pay

## 2020-09-24 DIAGNOSIS — M79675 Pain in left toe(s): Secondary | ICD-10-CM

## 2020-09-24 DIAGNOSIS — E119 Type 2 diabetes mellitus without complications: Secondary | ICD-10-CM | POA: Diagnosis not present

## 2020-09-24 DIAGNOSIS — M79674 Pain in right toe(s): Secondary | ICD-10-CM

## 2020-09-24 DIAGNOSIS — B351 Tinea unguium: Secondary | ICD-10-CM | POA: Diagnosis not present

## 2020-09-26 NOTE — Progress Notes (Signed)
Subjective: Holly Stephens is a pleasant 65 y.o. female patient seen today painful thick toenails that are difficult to trim. Pain interferes with ambulation. Aggravating factors include wearing enclosed shoe gear. Pain is relieved with periodic professional debridement.  She also has h/o Hodgkin's Lymphoma treated with chemotherapy. She has some neuropathy in her toes.  She does not check her blood glucose daily.  PCP is Celene Squibb, MD.  Allergies  Allergen Reactions   Augmentin [Amoxicillin-Pot Clavulanate] Hives and Other (See Comments)    Has patient had a PCN reaction causing immediate rash, facial/tongue/throat swelling, SOB or lightheadedness with hypotension: No Has patient had a PCN reaction causing severe rash involving mucus membranes or skin necrosis: No Has patient had a PCN reaction that required hospitalization No Has patient had a PCN reaction occurring within the last 10 years: No If all of the above answers are "NO", then may proceed with Cephalosporin use.   Bleomycin Cough   Cefpodoxime Proxetil Itching and Rash    Objective: Physical Exam  General: Holly Stephens is a pleasant 65 y.o. Caucasian female, obese in NAD. AAO x 3.   Vascular:  Capillary refill time to digits immediate b/l. Palpable pedal pulses b/l LE. Pedal hair sparse. Lower extremity skin temperature gradient within normal limits. No pain with calf compression b/l. No edema noted b/l lower extremities.  Dermatological:  Pedal skin with normal turgor, texture and tone b/l lower extremities. No open wounds b/l lower extremities. No interdigital macerations b/l lower extremities. Toenails 1-5 b/l elongated, discolored, dystrophic, thickened, crumbly with subungual debris and tenderness to dorsal palpation.  Musculoskeletal:  Normal muscle strength 5/5 to all lower extremity muscle groups bilaterally. No pain crepitus or joint limitation noted with ROM b/l lower extremities. Pes planus deformity  noted b/l lower extremities.  Neurological:  Pt has subjective symptoms of neuropathy. Protective sensation intact 5/5 intact bilaterally with 10g monofilament b/l.  Assessment and Plan:  1. Pain due to onychomycosis of toenails of both feet   2. Controlled type 2 diabetes mellitus without complication, without long-term current use of insulin (HCC)   -No new findings. No new orders. -Continue diabetic foot care principles. -Patient to continue soft, supportive shoe gear daily. -Toenails 1-5 b/l were debrided in length and girth with sterile nail nippers and dremel without iatrogenic bleeding.  -Patient to report any pedal injuries to medical professional immediately. -Patient/POA to call should there be question/concern in the interim.  Return in about 3 months (around 12/25/2020).  Marzetta Board, DPM

## 2020-11-15 DIAGNOSIS — E119 Type 2 diabetes mellitus without complications: Secondary | ICD-10-CM | POA: Diagnosis not present

## 2020-12-06 DIAGNOSIS — M1611 Unilateral primary osteoarthritis, right hip: Secondary | ICD-10-CM | POA: Diagnosis not present

## 2020-12-23 ENCOUNTER — Other Ambulatory Visit: Payer: Self-pay

## 2020-12-23 ENCOUNTER — Inpatient Hospital Stay (HOSPITAL_COMMUNITY): Payer: BC Managed Care – PPO | Attending: Hematology

## 2020-12-23 DIAGNOSIS — Z8249 Family history of ischemic heart disease and other diseases of the circulatory system: Secondary | ICD-10-CM | POA: Diagnosis not present

## 2020-12-23 DIAGNOSIS — Z803 Family history of malignant neoplasm of breast: Secondary | ICD-10-CM | POA: Diagnosis not present

## 2020-12-23 DIAGNOSIS — Z880 Allergy status to penicillin: Secondary | ICD-10-CM | POA: Diagnosis not present

## 2020-12-23 DIAGNOSIS — Z79899 Other long term (current) drug therapy: Secondary | ICD-10-CM | POA: Diagnosis not present

## 2020-12-23 DIAGNOSIS — I5089 Other heart failure: Secondary | ICD-10-CM | POA: Insufficient documentation

## 2020-12-23 DIAGNOSIS — Z923 Personal history of irradiation: Secondary | ICD-10-CM | POA: Insufficient documentation

## 2020-12-23 DIAGNOSIS — M2550 Pain in unspecified joint: Secondary | ICD-10-CM | POA: Diagnosis not present

## 2020-12-23 DIAGNOSIS — C8114 Nodular sclerosis classical Hodgkin lymphoma, lymph nodes of axilla and upper limb: Secondary | ICD-10-CM | POA: Diagnosis not present

## 2020-12-23 DIAGNOSIS — Z86718 Personal history of other venous thrombosis and embolism: Secondary | ICD-10-CM | POA: Diagnosis not present

## 2020-12-23 DIAGNOSIS — Z881 Allergy status to other antibiotic agents status: Secondary | ICD-10-CM | POA: Diagnosis not present

## 2020-12-23 DIAGNOSIS — D751 Secondary polycythemia: Secondary | ICD-10-CM | POA: Diagnosis not present

## 2020-12-23 DIAGNOSIS — M25551 Pain in right hip: Secondary | ICD-10-CM | POA: Insufficient documentation

## 2020-12-23 DIAGNOSIS — R5383 Other fatigue: Secondary | ICD-10-CM | POA: Insufficient documentation

## 2020-12-23 DIAGNOSIS — R200 Anesthesia of skin: Secondary | ICD-10-CM | POA: Diagnosis not present

## 2020-12-23 DIAGNOSIS — T451X5A Adverse effect of antineoplastic and immunosuppressive drugs, initial encounter: Secondary | ICD-10-CM | POA: Insufficient documentation

## 2020-12-23 DIAGNOSIS — Z818 Family history of other mental and behavioral disorders: Secondary | ICD-10-CM | POA: Insufficient documentation

## 2020-12-23 DIAGNOSIS — Z21 Asymptomatic human immunodeficiency virus [HIV] infection status: Secondary | ICD-10-CM | POA: Insufficient documentation

## 2020-12-23 DIAGNOSIS — M8580 Other specified disorders of bone density and structure, unspecified site: Secondary | ICD-10-CM | POA: Diagnosis not present

## 2020-12-23 DIAGNOSIS — C8118 Nodular sclerosis classical Hodgkin lymphoma, lymph nodes of multiple sites: Secondary | ICD-10-CM

## 2020-12-23 LAB — LACTATE DEHYDROGENASE: LDH: 135 U/L (ref 98–192)

## 2020-12-23 LAB — CBC WITH DIFFERENTIAL/PLATELET
Abs Immature Granulocytes: 0.03 10*3/uL (ref 0.00–0.07)
Basophils Absolute: 0.1 10*3/uL (ref 0.0–0.1)
Basophils Relative: 1 %
Eosinophils Absolute: 0.2 10*3/uL (ref 0.0–0.5)
Eosinophils Relative: 2 %
HCT: 48.4 % — ABNORMAL HIGH (ref 36.0–46.0)
Hemoglobin: 16.3 g/dL — ABNORMAL HIGH (ref 12.0–15.0)
Immature Granulocytes: 0 %
Lymphocytes Relative: 35 %
Lymphs Abs: 2.9 10*3/uL (ref 0.7–4.0)
MCH: 32 pg (ref 26.0–34.0)
MCHC: 33.7 g/dL (ref 30.0–36.0)
MCV: 94.9 fL (ref 80.0–100.0)
Monocytes Absolute: 0.7 10*3/uL (ref 0.1–1.0)
Monocytes Relative: 8 %
Neutro Abs: 4.3 10*3/uL (ref 1.7–7.7)
Neutrophils Relative %: 54 %
Platelets: 264 10*3/uL (ref 150–400)
RBC: 5.1 MIL/uL (ref 3.87–5.11)
RDW: 13 % (ref 11.5–15.5)
WBC: 8.2 10*3/uL (ref 4.0–10.5)
nRBC: 0 % (ref 0.0–0.2)

## 2020-12-23 LAB — COMPREHENSIVE METABOLIC PANEL
ALT: 38 U/L (ref 0–44)
AST: 33 U/L (ref 15–41)
Albumin: 4.4 g/dL (ref 3.5–5.0)
Alkaline Phosphatase: 41 U/L (ref 38–126)
Anion gap: 10 (ref 5–15)
BUN: 18 mg/dL (ref 8–23)
CO2: 25 mmol/L (ref 22–32)
Calcium: 10.1 mg/dL (ref 8.9–10.3)
Chloride: 103 mmol/L (ref 98–111)
Creatinine, Ser: 0.73 mg/dL (ref 0.44–1.00)
GFR, Estimated: >60 mL/min (ref >60)
Glucose, Bld: 145 mg/dL — ABNORMAL HIGH (ref 70–99)
Potassium: 3.8 mmol/L (ref 3.5–5.1)
Sodium: 138 mmol/L (ref 135–145)
Total Bilirubin: 0.4 mg/dL (ref 0.3–1.2)
Total Protein: 7.7 g/dL (ref 6.5–8.1)

## 2020-12-23 LAB — VITAMIN D 25 HYDROXY (VIT D DEFICIENCY, FRACTURES): Vit D, 25-Hydroxy: 32.93 ng/mL (ref 30–100)

## 2020-12-23 LAB — SEDIMENTATION RATE: Sed Rate: 11 mm/hr (ref 0–22)

## 2020-12-24 LAB — PTH, INTACT AND CALCIUM
Calcium, Total (PTH): 10.5 mg/dL — ABNORMAL HIGH (ref 8.7–10.3)
PTH: 13 pg/mL — ABNORMAL LOW (ref 15–65)

## 2020-12-30 ENCOUNTER — Inpatient Hospital Stay (HOSPITAL_COMMUNITY): Payer: BC Managed Care – PPO | Admitting: Physician Assistant

## 2020-12-30 ENCOUNTER — Other Ambulatory Visit: Payer: Self-pay

## 2020-12-30 ENCOUNTER — Encounter (HOSPITAL_COMMUNITY): Payer: Self-pay | Admitting: Physician Assistant

## 2020-12-30 VITALS — BP 142/79 | HR 96 | Temp 97.6°F | Resp 18 | Wt 233.7 lb

## 2020-12-30 DIAGNOSIS — M25551 Pain in right hip: Secondary | ICD-10-CM | POA: Diagnosis not present

## 2020-12-30 DIAGNOSIS — Z21 Asymptomatic human immunodeficiency virus [HIV] infection status: Secondary | ICD-10-CM | POA: Diagnosis not present

## 2020-12-30 DIAGNOSIS — Z923 Personal history of irradiation: Secondary | ICD-10-CM | POA: Diagnosis not present

## 2020-12-30 DIAGNOSIS — Z79899 Other long term (current) drug therapy: Secondary | ICD-10-CM | POA: Diagnosis not present

## 2020-12-30 DIAGNOSIS — C8118 Nodular sclerosis classical Hodgkin lymphoma, lymph nodes of multiple sites: Secondary | ICD-10-CM | POA: Diagnosis not present

## 2020-12-30 DIAGNOSIS — M8580 Other specified disorders of bone density and structure, unspecified site: Secondary | ICD-10-CM | POA: Diagnosis not present

## 2020-12-30 DIAGNOSIS — Z880 Allergy status to penicillin: Secondary | ICD-10-CM | POA: Diagnosis not present

## 2020-12-30 DIAGNOSIS — Z881 Allergy status to other antibiotic agents status: Secondary | ICD-10-CM | POA: Diagnosis not present

## 2020-12-30 DIAGNOSIS — R200 Anesthesia of skin: Secondary | ICD-10-CM | POA: Diagnosis not present

## 2020-12-30 DIAGNOSIS — M2550 Pain in unspecified joint: Secondary | ICD-10-CM | POA: Diagnosis not present

## 2020-12-30 DIAGNOSIS — D751 Secondary polycythemia: Secondary | ICD-10-CM | POA: Diagnosis not present

## 2020-12-30 DIAGNOSIS — Z86718 Personal history of other venous thrombosis and embolism: Secondary | ICD-10-CM | POA: Diagnosis not present

## 2020-12-30 DIAGNOSIS — R5383 Other fatigue: Secondary | ICD-10-CM | POA: Diagnosis not present

## 2020-12-30 DIAGNOSIS — I5089 Other heart failure: Secondary | ICD-10-CM | POA: Diagnosis not present

## 2020-12-30 DIAGNOSIS — T451X5A Adverse effect of antineoplastic and immunosuppressive drugs, initial encounter: Secondary | ICD-10-CM | POA: Diagnosis not present

## 2020-12-30 DIAGNOSIS — C8114 Nodular sclerosis classical Hodgkin lymphoma, lymph nodes of axilla and upper limb: Secondary | ICD-10-CM | POA: Diagnosis not present

## 2020-12-30 NOTE — Progress Notes (Signed)
[address]  [address] [place], [place] [address]   CLINIC:  Medical Oncology/Hematology  PCP:  Celene Squibb, MD [place] [address] [phone]   REASON FOR VISIT:  Follow-up for Hodgkin's lymphoma, nodular-sclerosing type   PRIOR THERAPY:  1.  ABVD from 09/13/2015 to 02/23/2016. 2. XRT to left axilla through 05/23/2016.   CURRENT THERAPY: Surveillance   INTERVAL HISTORY:  Ms. Holly Stephens, a 65 y.o. female, returns for routine follow-up for her Hodgkin's lymphoma, nodular-sclerosing type. Linn was last seen by Faythe Casa, NP, on 06/17/2020.  At today's visit, she reports feeling fair.  No recent hospitalizations, surgeries, or changes in baseline health status.  Her chief concern today is her osteoarthritic right hip pain, and she reports she is awaiting right hip replacement surgery.  She also notes that she has had some mild difficulty swallowing certain solids and has been coughing when she eats.  Otherwise, she is doing well and is at her baseline functional status.  Her primary lymphoma was present in her left axillary lymph nodes.  No fever, chills, night sweats.  No extremity swelling.  No new lumps or bumps.  No shortness of breath.  No abnormal pruritus.  She has lost about 20 pounds over the past year, but this has been intentional weight loss.  Regarding her mild hypercalcemia, she does not note any bone pain, abdominal pain, nausea, vomiting, confusion, lethargy, or abnormal fatigue.  Regarding her erythrocytosis, she does not note any aquagenic pruritus, erythromelalgia, or vasomotor symptoms.   She is a non-smoker.   No known history of sleep apnea.  She has 75% energy and 100% appetite. She endorses that she is maintaining a stable weight.    REVIEW OF SYSTEMS:  Review of Systems  Constitutional:  Positive for fatigue (mild). Negative for appetite change, chills, diaphoresis, fever and unexpected weight  change.  HENT:   Positive for trouble swallowing. Negative for lump/mass and nosebleeds.   Eyes:  Negative for eye problems.  Respiratory:  Positive for cough (with swallowing). Negative for hemoptysis and shortness of breath.   Cardiovascular:  Negative for chest pain, leg swelling and palpitations.  Gastrointestinal:  Negative for abdominal pain, blood in stool, constipation, diarrhea, nausea and vomiting.  Genitourinary:  Negative for hematuria.   Musculoskeletal:  Positive for arthralgias (right hip).  Skin: Negative.   Neurological:  Positive for numbness (bilateral). Negative for dizziness, headaches and light-headedness.  Hematological:  Does not bruise/bleed easily.     PAST MEDICAL/SURGICAL HISTORY:  Past Medical History:  Diagnosis Date   Acute deep vein thrombosis (DVT) of popliteal vein of left lower extremity ([place]) 02/09/2016   Arthritis    osteoarthritis   Diabetes mellitus without complication ([place])    History of radiation therapy 04/25/2016 - 05/23/2016   Left Axilla treated to 36 Gy in 20 fractions   HIV infection ([place])    [name], nodular sclerosis ([place]) 08/23/2015   Human immunodeficiency virus (HIV) disease ([place]) 02/23/2006   Qualifier: Diagnosis of  By: Megan Salon MD, John     Past Surgical History:  Procedure Laterality Date   AXILLARY LYMPH NODE BIOPSY Left 07/23/2015   Procedure: EXCISIONAL BIOPSY LEFT AXILLARY LYMPH NODE;  Surgeon: [name], MD;  Location: [facility];  Service: General;  Laterality: Left;   PORT-A-CATH REMOVAL N/A 01/09/2018   Procedure: MINOR REMOVAL PORT-A-CATH;  Surgeon: Aviva Signs, MD;  Location: [facility];  Service: General;  Laterality: N/A;  PORTACATH PLACEMENT Right 09/08/2015   Procedure: INSERTION OF CENTRAL VENOUS CATHETER WITH PORT FOR CHEMOTHERAPY;  Surgeon: [name], MD;  Location: [facility];  Service: General;  Laterality: Right;   SHOULDER SURGERY Right    removal of bone spur     SOCIAL HISTORY:  Social  History   Socioeconomic History   Marital status: Widowed    Spouse name: Not on file   Number of children: Not on file   Years of education: Not on file   Highest education level: Not on file  Occupational History   Not on file  Tobacco Use   Smoking status: Never   Smokeless tobacco: Never  Vaping Use   Vaping Use: Never used  Substance and Sexual Activity   Alcohol use: No   Drug use: No   Sexual activity: Never    Comment: declined condoms  Other Topics Concern   Not on file  Social History Narrative   Not on file   Social Determinants of Health   Financial Resource Strain: Not on file  Food Insecurity: Not on file  Transportation Needs: Not on file  Physical Activity: Not on file  Stress: Not on file  Social Connections: Not on file  Intimate Partner Violence: Not At Risk   Fear of Current or Ex-Partner: No   Emotionally Abused: No   Physically Abused: No   Sexually Abused: No    FAMILY HISTORY:  Family History  Problem Relation Age of Onset   Hypertension Mother    Cancer Mother 66       breast    Hypertension Father    Heart attack Father    Pulmonary embolism Sister    Parkinson's disease Brother     CURRENT MEDICATIONS:  Outpatient Encounter Medications as of 12/30/2020  Medication Sig   acetaminophen (TYLENOL) 650 MG CR tablet Take 1,300 mg by mouth every 8 (eight) hours as needed for pain.   ALPRAZolam (XANAX) 0.5 MG tablet Take 0.5 mg by mouth daily as needed.   cholecalciferol (VITAMIN D) 1000 units tablet Take 1,000 Units by mouth daily.    clotrimazole-betamethasone (LOTRISONE) cream For athlete's feet, apply to both feet and between toes bid x 4 weeks.   dolutegravir (TIVICAY) 50 MG tablet Take 1 tablet (50 mg total) by mouth daily. Take with Odefsey.   emtricitabine-rilpivir-tenofovir AF (ODEFSEY) 200-25-25 MG TABS tablet Take 1 tablet by mouth daily with breakfast. Take with Tivicay.   fenofibrate 160 MG tablet Take 160 mg by mouth daily.     fexofenadine (ALLEGRA) 180 MG tablet Take 180 mg by mouth daily as needed for allergies.    furosemide (LASIX) 20 MG tablet Take 1 tablet (20 mg total) by mouth daily.   GLUCOSAMINE-CHONDROITIN PO Take 2 tablets by mouth daily.    ibuprofen (ADVIL) 800 MG tablet TAKE 1 TABLET BY MOUTH THREE TIMES DAILY FOR 3 DAYS THEN TAKE AS NEEDED   JARDIANCE 25 MG TABS tablet Take 25 mg by mouth daily.    metFORMIN (GLUCOPHAGE-XR) 500 MG 24 hr tablet Take 500 mg by mouth 2 (two) times daily with a meal.    methocarbamol (ROBAXIN) 750 MG tablet Take 750 mg by mouth as needed.    metoprolol tartrate (LOPRESSOR) 25 MG tablet Take 1 tablet (25 mg total) by mouth daily.   Multiple Vitamin (MULTIVITAMIN WITH MINERALS) TABS tablet Take 1 tablet by mouth daily.   REPATHA 140 MG/ML SOSY INJECT 1 SYRINGE INTO THE SKIN Q 2 WEEKS  Semaglutide,0.25 or 0.5MG /DOS, (OZEMPIC, 0.25 OR 0.5 MG/DOSE,) 2 MG/1.5ML SOPN once a week.    spironolactone (ALDACTONE) 25 MG tablet TAKE 1 TABLET DAILY (Patient taking differently: Take 25 mg by mouth daily.)   vitamin E 400 UNIT capsule Take 400 Units by mouth daily.   No facility-administered encounter medications on file as of 12/30/2020.    ALLERGIES:  Allergies  Allergen Reactions   Augmentin [Amoxicillin-Pot Clavulanate] Hives and Other (See Comments)    Has patient had a PCN reaction causing immediate rash, facial/tongue/throat swelling, SOB or lightheadedness with hypotension: No Has patient had a PCN reaction causing severe rash involving mucus membranes or skin necrosis: No Has patient had a PCN reaction that required hospitalization No Has patient had a PCN reaction occurring within the last 10 years: No If all of the above answers are "NO", then may proceed with Cephalosporin use.   Bleomycin Cough   Cefpodoxime Proxetil Itching and Rash     PHYSICAL EXAM:  ECOG PERFORMANCE STATUS: 1 - Symptomatic but completely ambulatory  There were no vitals filed for this  visit. There were no vitals filed for this visit. Physical Exam Constitutional:      Appearance: Normal appearance. She is morbidly obese.  HENT:     Head: Normocephalic and atraumatic.     Mouth/Throat:     Mouth: Mucous membranes are moist.  Eyes:     Extraocular Movements: Extraocular movements intact.     Pupils: Pupils are equal, round, and reactive to light.  Cardiovascular:     Rate and Rhythm: Normal rate and regular rhythm.     Pulses: Normal pulses.     Heart sounds: Normal heart sounds.  Pulmonary:     Effort: Pulmonary effort is normal.     Breath sounds: Normal breath sounds.  Abdominal:     General: Bowel sounds are normal.     Palpations: Abdomen is soft.     Tenderness: There is no abdominal tenderness.     Comments: No hepatosplenomegaly noted on exam, but exam is somewhat limited due to central abdominal obesity  Musculoskeletal:        General: No swelling.     Right lower leg: No edema.     Left lower leg: No edema.  Lymphadenopathy:     Head:     Right side of head: No submental, submandibular, tonsillar, preauricular, posterior auricular or occipital adenopathy.     Left side of head: No submental, submandibular, tonsillar, preauricular, posterior auricular or occipital adenopathy.     Cervical: No cervical adenopathy.     Right cervical: No superficial, deep or posterior cervical adenopathy.    Left cervical: No superficial, deep or posterior cervical adenopathy.     Upper Body:     Right upper body: No supraclavicular, axillary, pectoral or epitrochlear adenopathy.     Left upper body: No supraclavicular, axillary, pectoral or epitrochlear adenopathy.  Skin:    General: Skin is warm and dry.  Neurological:     General: No focal deficit present.     Mental Status: She is alert and oriented to person, place, and time.  Psychiatric:        Mood and Affect: Mood normal.        Behavior: Behavior normal.     LABORATORY DATA:  I have reviewed the  labs as listed.  CBC    Component Value Date/Time   WBC 8.2 12/23/2020 1323   RBC 5.10 12/23/2020 1323   HGB 16.3 (H)  12/23/2020 1323   HCT 48.4 (H) 12/23/2020 1323   PLT 264 12/23/2020 1323   MCV 94.9 12/23/2020 1323   MCH 32.0 12/23/2020 1323   MCHC 33.7 12/23/2020 1323   RDW 13.0 12/23/2020 1323   LYMPHSABS 2.9 12/23/2020 1323   MONOABS 0.7 12/23/2020 1323   EOSABS 0.2 12/23/2020 1323   BASOSABS 0.1 12/23/2020 1323   CMP Latest Ref Rng & Units 12/23/2020 12/23/2020 06/14/2020  Glucose 70 - 99 mg/dL 145(H) - 127(H)  BUN 8 - 23 mg/dL 18 - 20  Creatinine 0.44 - 1.00 mg/dL 0.73 - 0.76  Sodium 135 - 145 mmol/L 138 - 140  Potassium 3.5 - 5.1 mmol/L 3.8 - 4.6  Chloride 98 - 111 mmol/L 103 - 103  CO2 22 - 32 mmol/L 25 - 26  Calcium 8.7 - 10.3 mg/dL 10.1 10.5(H) 10.7(H)  Total Protein 6.5 - 8.1 g/dL 7.7 - 7.7  Total Bilirubin 0.3 - 1.2 mg/dL 0.4 - 0.5  Alkaline Phos 38 - 126 U/L 41 - 47  AST 15 - 41 U/L 33 - 30  ALT 0 - 44 U/L 38 - 36    DIAGNOSTIC IMAGING:  I have independently reviewed the relevant imaging and discussed with the patient.  ASSESSMENT & PLAN: 1.  Hodgkin's Lymphoma, nodular-sclerosing type involving multiple lymph nodes: -Left axilla pathology : CD30, CD15, and PAX-5 positive. -ABVD chemotherapy beginning on 09/13/2015, with discontinuation of Bleomycin on 10/11/2015. -Mid-course PET scan revealed Deauville 3 lymphadenopathy, so she received 4 additional cycles of AVD. -Treatment course was complicated by diastolic heart failure. -Chemotherapy completed on 02/23/2016. -Post-treatment PET revealed mild residual Left axillary adenopathy with a Deauville 2, so she was referred for axillary radiation therapy. -Completed XRT to left axilla on 05/23/16. -repeat PET on 07/2016 showed mild residual low level metabolic activity within left axillary lymph nodes a similar to prior deauville 2. - Reviewed CT from 06/14/2020 which did not show any evidence of active lymphoma. - She  does not have any B symptoms.  No palpable adenopathy on exam   - Most recent labs (12/23/2020): Normal LFTs, normal LDH.  No anemia or leukocytosis. - PLAN: Repeat CT chest/abdomen/pelvis with contrast in 6 months. - Labs in 6 months. - MD visit with Dr. Delton Coombes after CT scan and labs.  2.  Mild hypercalcemia: - Mild hypercalcemia noted over the past year -She has been taking calcium for the past few months after being told she has osteopenia. - Most recent labs (12/23/2020) show mildly elevated calcium 10.5 with low PTH 13.  Vitamin D is normal at 32.93. -No symptoms of hypercalcemia - Suspect nonparathyroid related hypercalcemia, but since this is mild we will continue to watch and wait for the time being - PLAN: Repeat calcium studies/PTH at next visit in 6 months. - If persistent or worsening elevations, would consider checking urine calcium studies, SPEP, and PTH related peptide. - Patient advised to stop taking calcium but continue taking vitamin D.  3.  Erythrocytosis: - Mild erythrocytosis since 2019, with Hgb ranging from 15.0-17.0 - Most recent labs (12/23/2020): With Hgb 16.3/HCT 48.4 -  She is a non-smoker.  No known history of sleep apnea or COPD.   - She takes Lasix 20 mg and spironolactone 25 mg daily - JAK2 mutational analysis is pending -No erythromelalgia, aquagenic pruritus, or vasomotor symptoms. Suspect hemoconcentration in the setting of diuretic use, but we will follow along with JAK2 results - PLAN: We will follow JAK2 results, and if positive would consider  phlebotomy or cytoreductive therapy.  For the time being, we will continue to monitor CBC at follow-up visits.  4.  HIV: -She continues to follow up with Infectious disease.  5.  Chemo induced heart failure: -She continues to follow up with cardiology.    PLAN SUMMARY & DISPOSITION: -Labs in 6 months - CT chest/abdomen/pelvis in 6 months - Office visit with DR. KATRAGADDA after imaging/labs, in 6  months.  (Alternating visits between Dr. Raliegh Ip and [facility])  All questions were answered. The patient knows to call the clinic with any problems, questions or concerns.  Medical decision making: Moderate  Time spent on visit: I spent 20 minutes counseling the patient face to face. The total time spent in the appointment was 30 minutes and more than 50% was on counseling.   Harriett Rush, PA-C  12/30/2020 2:57 PM

## 2020-12-30 NOTE — Patient Instructions (Signed)
[place] at [facility] Discharge Instructions  You were seen today by Tarri Abernethy PA-C for your history of lymphoma.  You did not show any signs of recurrent cancer at this time.  We will check another CT scan and labs in 6 months, followed by an office visit with Dr. Delton Coombes.    Thank you for choosing [place] at [facility] to provide your oncology and hematology care.  To afford each patient quality time with our provider, please arrive at least 15 minutes before your scheduled appointment time.   If you have a lab appointment with the [facility] please come in thru the Main Entrance and check in at the main information desk.  You need to re-schedule your appointment should you arrive 10 or more minutes late.  We strive to give you quality time with our providers, and arriving late affects you and other patients whose appointments are after yours.  Also, if you no show three or more times for appointments you may be dismissed from the clinic at the providers discretion.     Again, thank you for choosing [facility].  Our hope is that these requests will decrease the amount of time that you wait before being seen by our physicians.       _____________________________________________________________  Should you have questions after your visit to [facility], please contact our office at [phone] and follow the prompts.  Our office hours are 8:00 a.m. and 4:30 p.m. Monday - Friday.  Please note that voicemails left after 4:00 p.m. may not be returned until the following business day.  We are closed weekends and major holidays.  You do have access to a nurse 24-7, just call the main number to the clinic [phone] and do not press any options, hold on the line and a nurse will answer the phone.    For prescription refill requests, have your pharmacy contact our office and allow 72 hours.    Due to  Covid, you will need to wear a mask upon entering the hospital. If you do not have a mask, a mask will be given to you at the Main Entrance upon arrival. For doctor visits, patients may have 1 support person age 38 or older with them. For treatment visits, patients can not have anyone with them due to social distancing guidelines and our immunocompromised population.

## 2020-12-31 ENCOUNTER — Other Ambulatory Visit: Payer: Self-pay | Admitting: Internal Medicine

## 2020-12-31 ENCOUNTER — Encounter: Payer: Self-pay | Admitting: Podiatry

## 2020-12-31 ENCOUNTER — Ambulatory Visit: Payer: BC Managed Care – PPO | Admitting: Podiatry

## 2020-12-31 DIAGNOSIS — E119 Type 2 diabetes mellitus without complications: Secondary | ICD-10-CM

## 2020-12-31 DIAGNOSIS — M2141 Flat foot [pes planus] (acquired), right foot: Secondary | ICD-10-CM

## 2020-12-31 DIAGNOSIS — L84 Corns and callosities: Secondary | ICD-10-CM

## 2020-12-31 DIAGNOSIS — M79675 Pain in left toe(s): Secondary | ICD-10-CM

## 2020-12-31 DIAGNOSIS — M79674 Pain in right toe(s): Secondary | ICD-10-CM | POA: Diagnosis not present

## 2020-12-31 DIAGNOSIS — B20 Human immunodeficiency virus [HIV] disease: Secondary | ICD-10-CM

## 2020-12-31 DIAGNOSIS — M2142 Flat foot [pes planus] (acquired), left foot: Secondary | ICD-10-CM

## 2020-12-31 DIAGNOSIS — B351 Tinea unguium: Secondary | ICD-10-CM

## 2020-12-31 LAB — JAK2 GENOTYPR

## 2020-12-31 NOTE — Patient Instructions (Signed)

## 2021-01-03 NOTE — Progress Notes (Signed)
ANNUAL DIABETIC FOOT EXAM  Subjective: Holly Stephens presents today for for annual diabetic foot examination and painful thick toenails that are difficult to trim. Pain interferes with ambulation. Aggravating factors include wearing enclosed shoe gear. Pain is relieved with periodic professional debridement..  Patient relates 10 year h/o diabetes.  Patient denies any h/o foot wounds.  Patient endorses  symptoms of foot numbness and tingling.  Patient's blood sugar was 132 mg/dl this morning.   Celene Squibb, MD is patient's PCP. Last visit was 6 months ago.  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   Human immunodeficiency virus (HIV) disease ([place]) 02/23/2006   Qualifier: Diagnosis of  By: Megan Salon MD, John     Patient Active Problem List   Diagnosis Date Noted   Routine screening for STI (sexually transmitted infection) 11/11/2019   Screening for cervical cancer 11/11/2019   Pain in left knee 02/19/2018   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   Acute deep vein thrombosis (DVT) of popliteal vein of left lower extremity ([place]) 02/09/2016   Congestive heart failure with cardiomyopathy ([place]) 10/27/2015   Acute congestive heart failure ([place]) 10/27/2015   Hodgkin's disease, nodular sclerosis, of lymph nodes of multiple sites ([place]) 08/23/2015   Visual floaters 09/12/2011   Episodic low back pain 11/15/2010   ROTATOR CUFF INJURY, RIGHT SHOULDER 11/03/2008   ADHESIVE CAPSULITIS OF SHOULDER 11/12/2007   OBESITY NOS 05/17/2006   SYNDROME, CARPAL TUNNEL 05/17/2006   FOOT PAIN 05/17/2006   Human  immunodeficiency virus (HIV) disease ([place]) 02/23/2006   Hyperlipidemia 02/23/2006   DEPRESSION 02/23/2006   Essential hypertension 02/23/2006   Past Surgical History:  Procedure Laterality Date   AXILLARY LYMPH NODE BIOPSY Left 07/23/2015   Procedure: EXCISIONAL BIOPSY LEFT AXILLARY LYMPH NODE;  Surgeon: [name], MD;  Location: [facility];  Service: General;  Laterality: Left;   PORT-A-CATH REMOVAL N/A 01/09/2018   Procedure: MINOR REMOVAL PORT-A-CATH;  Surgeon: Aviva Signs, MD;  Location: [facility];  Service: General;  Laterality: N/A;   PORTACATH PLACEMENT Right 09/08/2015   Procedure: INSERTION OF CENTRAL VENOUS CATHETER WITH PORT FOR CHEMOTHERAPY;  Surgeon: [name], MD;  Location: [facility];  Service: General;  Laterality: Right;   SHOULDER SURGERY Right    removal of bone spur   Current Outpatient Medications on File Prior to Visit  Medication Sig Dispense Refill   acetaminophen (TYLENOL) 650 MG CR tablet Take 1,300 mg by mouth every 8 (eight) hours as needed for pain.     cholecalciferol (VITAMIN D) 1000 units tablet Take 1,000 Units by mouth daily.      fenofibrate 160 MG tablet Take 160 mg by mouth daily.      fexofenadine (ALLEGRA) 180 MG tablet Take 180 mg by mouth daily as needed for allergies.      furosemide (LASIX) 20 MG tablet Take 1 tablet (20 mg total) by mouth daily. 90 tablet 2   GLUCOSAMINE-CHONDROITIN PO Take 2 tablets by mouth daily.      ibuprofen (ADVIL) 800 MG tablet TAKE 1 TABLET BY MOUTH THREE TIMES DAILY FOR 3  DAYS THEN TAKE AS NEEDED (Patient not taking: Reported on 12/30/2020)     JARDIANCE 25 MG TABS tablet Take 25 mg by mouth daily.      metFORMIN (GLUCOPHAGE-XR) 500 MG 24 hr tablet Take 500 mg by mouth 2 (two) times daily with a meal.      methocarbamol (ROBAXIN) 750 MG tablet Take 750 mg by mouth as needed.      metoprolol tartrate (LOPRESSOR) 25 MG tablet Take 1 tablet (25 mg total) by mouth daily. 30 tablet 0   Multiple Vitamin (MULTIVITAMIN  WITH MINERALS) TABS tablet Take 1 tablet by mouth daily.     REPATHA 140 MG/ML SOSY INJECT 1 SYRINGE INTO THE SKIN Q 2 WEEKS     Semaglutide,0.25 or 0.5MG /DOS, (OZEMPIC, 0.25 OR 0.5 MG/DOSE,) 2 MG/1.5ML SOPN once a week.      spironolactone (ALDACTONE) 25 MG tablet TAKE 1 TABLET DAILY (Patient taking differently: Take 25 mg by mouth daily.) 90 tablet 3   vitamin E 400 UNIT capsule Take 400 Units by mouth daily.     No current facility-administered medications on file prior to visit.    Allergies  Allergen Reactions   Augmentin [Amoxicillin-Pot Clavulanate] Hives and Other (See Comments)    Has patient had a PCN reaction causing immediate rash, facial/tongue/throat swelling, SOB or lightheadedness with hypotension: No Has patient had a PCN reaction causing severe rash involving mucus membranes or skin necrosis: No Has patient had a PCN reaction that required hospitalization No Has patient had a PCN reaction occurring within the last 10 years: No If all of the above answers are "NO", then may proceed with Cephalosporin use.   Bleomycin Cough   Cefpodoxime Proxetil Itching and Rash   Social History   Occupational History   Not on file  Tobacco Use   Smoking status: Never   Smokeless tobacco: Never  Vaping Use   Vaping Use: Never used  Substance and Sexual Activity   Alcohol use: No   Drug use: No   Sexual activity: Never    Comment: declined condoms   Family History  Problem Relation Age of Onset   Hypertension Mother    Cancer Mother 55       breast    Hypertension Father    Heart attack Father    Pulmonary embolism Sister    Parkinson's disease Brother    Immunization History  Administered Date(s) Administered   H1N1 03/23/2008   Hepatitis B 04/02/2001, 05/17/2001, 09/17/2001   Influenza Split 11/15/2010, 12/05/2011   Influenza Whole 11/15/2005, 11/16/2005, 11/20/2006, 11/12/2007, 12/03/2008, 11/29/2009   Influenza,inj,Quad PF,6+ Mos 11/05/2012, 12/01/2015    Influenza-Unspecified 11/19/2013, 11/09/2016, 12/04/2017   Moderna Sars-Covid-2 Vaccination 05/08/2019, 06/06/2019   Pneumococcal Polysaccharide-23 11/16/2005, 11/15/2010   Tdap 11/05/2012     Review of Systems: Negative except as noted in the HPI.   Objective: There were no vitals filed for this visit.  Holly Stephens is a pleasant 65 y.o. female in NAD. AAO X 3.  Vascular Examination: CFT immediate b/l LE. Palpable DP/PT pulses b/l LE. Digital hair sparse b/l. Skin temperature gradient WNL b/l. No pain with calf compression b/l. No edema noted b/l. No cyanosis or clubbing noted b/l LE.  Dermatological Examination: Pedal integument with normal turgor, texture and tone b/l LE. No open wounds b/l. No interdigital macerations b/l. Toenails 1-5 b/l elongated, thickened, discolored with subungual debris. +Tenderness with dorsal palpation of nailplates. Hyperkeratotic lesion(s) noted L hallux.  Musculoskeletal Examination: Normal muscle strength 5/5 to all  lower extremity muscle groups bilaterally. No pain, crepitus or joint limitation noted with ROM bilateral LE. Pes planus deformity noted bilateral LE.  Footwear Assessment: Does the patient wear appropriate shoes? Yes. Does the patient need inserts/orthotics?  Yes  Neurological Examination: Pt has subjective symptoms of neuropathy. Protective sensation intact 5/5 intact bilaterally with 10g monofilament b/l. Proprioception intact bilaterally.  Assessment: 1. Pain due to onychomycosis of toenails of both feet   2. Callus   3. Pes planus of both feet   4. Controlled type 2 diabetes mellitus without complication, without long-term current use of insulin ([place])   5. Encounter for diabetic foot exam ([place])     ADA Risk Categorization: Low Risk :  Patient has all of the following: Intact protective sensation No prior foot ulcer  No severe deformity Pedal pulses present  Plan: -Examined patient. -Diabetic foot examination  performed today. -Discussed diabetic foot care principles. Literature dispensed on today. -Continue diabetic foot care principles: inspect feet daily, monitor glucose as recommended by PCP and/or Endocrinologist, and follow prescribed diet per PCP, Endocrinologist and/or dietician. -Toenails 1-5 b/l were debrided in length and girth with sterile nail nippers and dremel without iatrogenic bleeding.  -Callus(es) L hallux pared utilizing sterile scalpel blade without complication or incident. Total number debrided =1. -Patient/POA to call should there be question/concern in the interim.  Return in about 3 months (around 04/02/2021).  Marzetta Board, DPM

## 2021-01-19 DIAGNOSIS — I10 Essential (primary) hypertension: Secondary | ICD-10-CM | POA: Diagnosis not present

## 2021-01-21 DIAGNOSIS — D72829 Elevated white blood cell count, unspecified: Secondary | ICD-10-CM | POA: Insufficient documentation

## 2021-01-21 DIAGNOSIS — R0981 Nasal congestion: Secondary | ICD-10-CM | POA: Diagnosis not present

## 2021-01-21 DIAGNOSIS — I10 Essential (primary) hypertension: Secondary | ICD-10-CM | POA: Diagnosis not present

## 2021-01-21 DIAGNOSIS — D751 Secondary polycythemia: Secondary | ICD-10-CM | POA: Diagnosis not present

## 2021-01-21 DIAGNOSIS — E119 Type 2 diabetes mellitus without complications: Secondary | ICD-10-CM | POA: Diagnosis not present

## 2021-01-21 DIAGNOSIS — E785 Hyperlipidemia, unspecified: Secondary | ICD-10-CM | POA: Diagnosis not present

## 2021-01-24 DIAGNOSIS — T7840XA Allergy, unspecified, initial encounter: Secondary | ICD-10-CM | POA: Diagnosis not present

## 2021-03-04 DIAGNOSIS — Z6841 Body Mass Index (BMI) 40.0 and over, adult: Secondary | ICD-10-CM | POA: Diagnosis not present

## 2021-03-04 DIAGNOSIS — R635 Abnormal weight gain: Secondary | ICD-10-CM | POA: Diagnosis not present

## 2021-03-23 DIAGNOSIS — E119 Type 2 diabetes mellitus without complications: Secondary | ICD-10-CM | POA: Diagnosis not present

## 2021-04-07 DIAGNOSIS — Z6841 Body Mass Index (BMI) 40.0 and over, adult: Secondary | ICD-10-CM | POA: Diagnosis not present

## 2021-04-07 DIAGNOSIS — E782 Mixed hyperlipidemia: Secondary | ICD-10-CM | POA: Diagnosis not present

## 2021-04-07 DIAGNOSIS — E119 Type 2 diabetes mellitus without complications: Secondary | ICD-10-CM | POA: Diagnosis not present

## 2021-04-15 ENCOUNTER — Encounter: Payer: Self-pay | Admitting: Podiatry

## 2021-04-15 ENCOUNTER — Ambulatory Visit: Payer: BC Managed Care – PPO | Admitting: Podiatry

## 2021-04-15 ENCOUNTER — Other Ambulatory Visit: Payer: Self-pay

## 2021-04-15 DIAGNOSIS — M79675 Pain in left toe(s): Secondary | ICD-10-CM | POA: Diagnosis not present

## 2021-04-15 DIAGNOSIS — M79674 Pain in right toe(s): Secondary | ICD-10-CM

## 2021-04-15 DIAGNOSIS — L84 Corns and callosities: Secondary | ICD-10-CM | POA: Diagnosis not present

## 2021-04-15 DIAGNOSIS — B351 Tinea unguium: Secondary | ICD-10-CM | POA: Diagnosis not present

## 2021-04-15 DIAGNOSIS — E119 Type 2 diabetes mellitus without complications: Secondary | ICD-10-CM

## 2021-04-18 DIAGNOSIS — E119 Type 2 diabetes mellitus without complications: Secondary | ICD-10-CM | POA: Diagnosis not present

## 2021-04-18 DIAGNOSIS — I110 Hypertensive heart disease with heart failure: Secondary | ICD-10-CM | POA: Diagnosis not present

## 2021-04-18 DIAGNOSIS — I5032 Chronic diastolic (congestive) heart failure: Secondary | ICD-10-CM | POA: Diagnosis not present

## 2021-04-18 DIAGNOSIS — M1611 Unilateral primary osteoarthritis, right hip: Secondary | ICD-10-CM | POA: Diagnosis not present

## 2021-04-19 NOTE — Progress Notes (Signed)
Subjective: Holly Stephens is a pleasant 66 y.o. female patient seen today preventative diabetic foot care and thick, elongated toenails both feet which are tender when wearing enclosed shoe gear.   She also has chemotherapy induced neuroapthy.  She does not check her blood glucose daily.  PCP is Celene Squibb, MD.Last visit was December, 2022.  Allergies  Allergen Reactions   Augmentin [Amoxicillin-Pot Clavulanate] Hives and Other (See Comments)    Has patient had a PCN reaction causing immediate rash, facial/tongue/throat swelling, SOB or lightheadedness with hypotension: No Has patient had a PCN reaction causing severe rash involving mucus membranes or skin necrosis: No Has patient had a PCN reaction that required hospitalization No Has patient had a PCN reaction occurring within the last 10 years: No If all of the above answers are "NO", then may proceed with Cephalosporin use.   Bleomycin Cough   Cefpodoxime Proxetil Itching and Rash   Doxycycline Rash    Objective: Physical Exam  General: Holly Stephens is a pleasant 66 y.o. Caucasian female, obese in NAD. AAO x 3.   Vascular:  Capillary refill time to digits immediate b/l. Palpable pedal pulses b/l LE. Pedal hair sparse. Lower extremity skin temperature gradient within normal limits. No pain with calf compression b/l. No edema noted b/l lower extremities.  Dermatological:  Pedal skin with normal turgor, texture and tone b/l lower extremities. No open wounds b/l lower extremities. No interdigital macerations b/l lower extremities. Toenails 1-5 b/l elongated, discolored, dystrophic, thickened, crumbly with subungual debris and tenderness to dorsal palpation. Hyperkeratotic lesion(s) L hallux.  No erythema, no edema, no drainage, no fluctuance.  Musculoskeletal:  Normal muscle strength 5/5 to all lower extremity muscle groups bilaterally. No pain crepitus or joint limitation noted with ROM b/l lower extremities. Pes planus  deformity noted b/l lower extremities.  Neurological:  Pt has subjective symptoms of neuropathy. Protective sensation intact 5/5 intact bilaterally with 10g monofilament b/l.  Assessment and Plan:  1. Pain due to onychomycosis of toenails of both feet   2. Callus   3. Controlled type 2 diabetes mellitus without complication, without long-term current use of insulin ([place])    -Examined patient. -Mycotic toenails 1-5 bilaterally were debrided in length and girth with sterile nail nippers and dremel without incident. -Callus(es) L hallux pared utilizing sterile scalpel blade without complication or incident. Total number debrided =1. -Patient to report any pedal injuries to medical professional immediately. -Patient/POA to call should there be question/concern in the interim.  Return in about 3 months (around 07/13/2021).  Marzetta Board, DPM

## 2021-04-20 DIAGNOSIS — E119 Type 2 diabetes mellitus without complications: Secondary | ICD-10-CM | POA: Diagnosis not present

## 2021-04-22 ENCOUNTER — Other Ambulatory Visit: Payer: Self-pay

## 2021-04-22 ENCOUNTER — Encounter: Payer: Self-pay | Admitting: Internal Medicine

## 2021-04-22 DIAGNOSIS — B20 Human immunodeficiency virus [HIV] disease: Secondary | ICD-10-CM

## 2021-04-22 MED ORDER — ODEFSEY 200-25-25 MG PO TABS: ORAL_TABLET | ORAL | 5 refills | Status: DC

## 2021-04-22 MED ORDER — TIVICAY 50 MG PO TABS: ORAL_TABLET | ORAL | 5 refills | Status: DC

## 2021-05-03 DIAGNOSIS — E119 Type 2 diabetes mellitus without complications: Secondary | ICD-10-CM | POA: Diagnosis not present

## 2021-05-03 DIAGNOSIS — I110 Hypertensive heart disease with heart failure: Secondary | ICD-10-CM | POA: Diagnosis not present

## 2021-05-03 DIAGNOSIS — I5032 Chronic diastolic (congestive) heart failure: Secondary | ICD-10-CM | POA: Diagnosis not present

## 2021-05-03 DIAGNOSIS — M1611 Unilateral primary osteoarthritis, right hip: Secondary | ICD-10-CM | POA: Diagnosis not present

## 2021-05-20 DIAGNOSIS — Z713 Dietary counseling and surveillance: Secondary | ICD-10-CM | POA: Diagnosis not present

## 2021-05-20 DIAGNOSIS — Z6841 Body Mass Index (BMI) 40.0 and over, adult: Secondary | ICD-10-CM | POA: Diagnosis not present

## 2021-05-21 DIAGNOSIS — E119 Type 2 diabetes mellitus without complications: Secondary | ICD-10-CM | POA: Diagnosis not present

## 2021-05-24 DIAGNOSIS — E119 Type 2 diabetes mellitus without complications: Secondary | ICD-10-CM | POA: Diagnosis not present

## 2021-05-24 DIAGNOSIS — E669 Obesity, unspecified: Secondary | ICD-10-CM | POA: Diagnosis not present

## 2021-05-24 DIAGNOSIS — Z713 Dietary counseling and surveillance: Secondary | ICD-10-CM | POA: Diagnosis not present

## 2021-05-24 DIAGNOSIS — I10 Essential (primary) hypertension: Secondary | ICD-10-CM | POA: Diagnosis not present

## 2021-05-24 DIAGNOSIS — M1611 Unilateral primary osteoarthritis, right hip: Secondary | ICD-10-CM | POA: Insufficient documentation

## 2021-05-26 ENCOUNTER — Other Ambulatory Visit: Payer: Self-pay

## 2021-05-26 ENCOUNTER — Other Ambulatory Visit: Payer: BC Managed Care – PPO

## 2021-05-26 DIAGNOSIS — B20 Human immunodeficiency virus [HIV] disease: Secondary | ICD-10-CM

## 2021-05-26 DIAGNOSIS — Z113 Encounter for screening for infections with a predominantly sexual mode of transmission: Secondary | ICD-10-CM | POA: Diagnosis not present

## 2021-05-27 LAB — T-HELPER CELL (CD4) - (RCID CLINIC ONLY)
CD4 % Helper T Cell: 38 % (ref 33–65)
CD4 T Cell Abs: 662 /uL (ref 400–1790)

## 2021-05-28 LAB — CBC
HCT: 50.2 % — ABNORMAL HIGH (ref 35.0–45.0)
Hemoglobin: 16.9 g/dL — ABNORMAL HIGH (ref 11.7–15.5)
MCH: 30.7 pg (ref 27.0–33.0)
MCHC: 33.7 g/dL (ref 32.0–36.0)
MCV: 91.1 fL (ref 80.0–100.0)
MPV: 10.2 fL (ref 7.5–12.5)
Platelets: 268 10*3/uL (ref 140–400)
RBC: 5.51 10*6/uL — ABNORMAL HIGH (ref 3.80–5.10)
RDW: 12.6 % (ref 11.0–15.0)
WBC: 7.6 10*3/uL (ref 3.8–10.8)

## 2021-05-28 LAB — COMPREHENSIVE METABOLIC PANEL
AG Ratio: 1.6 (calc) (ref 1.0–2.5)
ALT: 19 U/L (ref 6–29)
AST: 22 U/L (ref 10–35)
Albumin: 4.7 g/dL (ref 3.6–5.1)
Alkaline phosphatase (APISO): 45 U/L (ref 37–153)
BUN: 17 mg/dL (ref 7–25)
CO2: 27 mmol/L (ref 20–32)
Calcium: 10.4 mg/dL (ref 8.6–10.4)
Chloride: 101 mmol/L (ref 98–110)
Creat: 0.62 mg/dL (ref 0.50–1.05)
Globulin: 3 g/dL (calc) (ref 1.9–3.7)
Glucose, Bld: 92 mg/dL (ref 65–99)
Potassium: 4.7 mmol/L (ref 3.5–5.3)
Sodium: 140 mmol/L (ref 135–146)
Total Bilirubin: 0.4 mg/dL (ref 0.2–1.2)
Total Protein: 7.7 g/dL (ref 6.1–8.1)

## 2021-05-28 LAB — HIV-1 RNA QUANT-NO REFLEX-BLD
HIV 1 RNA Quant: NOT DETECTED copies/mL
HIV-1 RNA Quant, Log: NOT DETECTED Log copies/mL

## 2021-05-28 LAB — LIPID PANEL
Cholesterol: 115 mg/dL (ref <200)
HDL: 53 mg/dL (ref >=50)
LDL Cholesterol (Calc): 38 mg/dL (calc)
Non-HDL Cholesterol (Calc): 62 mg/dL (calc) (ref <130)
Total CHOL/HDL Ratio: 2.2 (calc) (ref <5.0)
Triglycerides: 160 mg/dL — ABNORMAL HIGH (ref <150)

## 2021-05-28 LAB — RPR: RPR Ser Ql: NONREACTIVE

## 2021-05-31 DIAGNOSIS — D751 Secondary polycythemia: Secondary | ICD-10-CM | POA: Diagnosis not present

## 2021-05-31 DIAGNOSIS — I10 Essential (primary) hypertension: Secondary | ICD-10-CM | POA: Diagnosis not present

## 2021-05-31 DIAGNOSIS — E119 Type 2 diabetes mellitus without complications: Secondary | ICD-10-CM | POA: Diagnosis not present

## 2021-05-31 DIAGNOSIS — E785 Hyperlipidemia, unspecified: Secondary | ICD-10-CM | POA: Diagnosis not present

## 2021-06-06 ENCOUNTER — Other Ambulatory Visit: Payer: Self-pay | Admitting: Internal Medicine

## 2021-06-06 DIAGNOSIS — B20 Human immunodeficiency virus [HIV] disease: Secondary | ICD-10-CM

## 2021-06-07 DIAGNOSIS — I10 Essential (primary) hypertension: Secondary | ICD-10-CM | POA: Diagnosis not present

## 2021-06-07 DIAGNOSIS — E669 Obesity, unspecified: Secondary | ICD-10-CM | POA: Diagnosis not present

## 2021-06-07 DIAGNOSIS — M1611 Unilateral primary osteoarthritis, right hip: Secondary | ICD-10-CM | POA: Diagnosis not present

## 2021-06-07 DIAGNOSIS — E119 Type 2 diabetes mellitus without complications: Secondary | ICD-10-CM | POA: Diagnosis not present

## 2021-06-09 ENCOUNTER — Encounter: Payer: BC Managed Care – PPO | Admitting: Internal Medicine

## 2021-06-10 ENCOUNTER — Ambulatory Visit: Payer: Self-pay | Admitting: Student

## 2021-06-10 DIAGNOSIS — E1165 Type 2 diabetes mellitus with hyperglycemia: Secondary | ICD-10-CM

## 2021-06-15 ENCOUNTER — Ambulatory Visit: Payer: BC Managed Care – PPO | Admitting: Internal Medicine

## 2021-06-15 ENCOUNTER — Other Ambulatory Visit: Payer: Self-pay

## 2021-06-15 ENCOUNTER — Encounter: Payer: Self-pay | Admitting: Internal Medicine

## 2021-06-15 DIAGNOSIS — B20 Human immunodeficiency virus [HIV] disease: Secondary | ICD-10-CM

## 2021-06-15 MED ORDER — TIVICAY 50 MG PO TABS: ORAL_TABLET | ORAL | 11 refills | Status: DC

## 2021-06-15 MED ORDER — ODEFSEY 200-25-25 MG PO TABS: ORAL_TABLET | ORAL | 11 refills | Status: DC

## 2021-06-15 NOTE — Progress Notes (Signed)
? ?   ? ? ? ? ?Patient Active Problem List  ? Diagnosis Date Noted  ? Human immunodeficiency virus (HIV) disease ([place]) 02/23/2006  ?  Priority: High  ? Routine screening for STI (sexually transmitted infection) 11/11/2019  ? Screening for cervical cancer 11/11/2019  ? Pain in left knee 02/19/2018  ? 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 Acute deep vein thrombosis (DVT) of popliteal vein of left lower extremity ([place]) 02/09/2016  ? Congestive heart failure with cardiomyopathy ([place]) 10/27/2015  ? Acute congestive heart failure ([place]) 10/27/2015  ? Hodgkin's disease, nodular sclerosis, of lymph nodes of multiple sites ([place]) 08/23/2015  ? Visual floaters 09/12/2011  ? Episodic low back pain 11/15/2010  ? ROTATOR CUFF INJURY, RIGHT SHOULDER 11/03/2008  ? ADHESIVE CAPSULITIS OF SHOULDER 11/12/2007  ? OBESITY NOS 05/17/2006  ? SYNDROME, CARPAL TUNNEL 05/17/2006  ? FOOT PAIN 05/17/2006  ? Hyperlipidemia 02/23/2006  ? DEPRESSION 02/23/2006  ? Essential hypertension 02/23/2006  ? ? ?Patient's Medications  ?New Prescriptions  ? No medications on file  ?Previous Medications  ? ACETAMINOPHEN (TYLENOL) 650 MG CR TABLET    Take 1,300 mg by mouth every 8 (eight) hours as needed for pain.  ? BUPROPION (WELLBUTRIN XL) 150 MG 24 HR TABLET    Take by mouth.  ? CHOLECALCIFEROL (VITAMIN D) 1000 UNITS TABLET    Take 1,000 Units by mouth daily.   ? CLINDAMYCIN (CLEOCIN) 150 MG CAPSULE    Take 150 mg by mouth 3 (three) times daily.  ? DOXYCYCLINE (VIBRA-TABS) 100 MG TABLET    Take 100 mg by mouth 2 (two) times daily.  ? FAMOTIDINE (PEPCID) 20 MG TABLET    Take 20 mg by mouth 2 (two) times daily.  ? FENOFIBRATE 160 MG TABLET    Take 160 mg by mouth daily.   ? FEXOFENADINE (ALLEGRA) 180 MG TABLET    Take 180 mg by mouth daily as needed for allergies.   ? FUROSEMIDE (LASIX) 20 MG TABLET    Take 1 tablet (20 mg total) by mouth daily.  ? GLUCOSAMINE-CHONDROITIN PO    Take 2 tablets by mouth daily.   ?  HYDROXYZINE (ATARAX) 25 MG TABLET    Take 25 mg by mouth 3 (three) times daily as needed.  ? IBUPROFEN (ADVIL) 800 MG TABLET    TAKE 1 TABLET BY MOUTH THREE TIMES DAILY FOR 3 DAYS THEN TAKE AS NEEDED  ? JARDIANCE 25 MG TABS TABLET    Take 25 mg by mouth daily.   ? METFORMIN (GLUCOPHAGE) 500 MG TABLET    metformin 500 mg tablet  ? METFORMIN (GLUCOPHAGE-XR) 500 MG 24 HR TABLET    Take 500 mg by mouth 2 (two) times daily with a meal.   ? METHOCARBAMOL (ROBAXIN) 750 MG TABLET    Take 750 mg by mouth as needed.   ? METOPROLOL TARTRATE (LOPRESSOR) 25 MG TABLET    Take 1 tablet (25 mg total) by mouth daily.  ? MULTIPLE VITAMIN (MULTIVITAMIN WITH MINERALS) TABS TABLET    Take 1 tablet by mouth daily.  ? PREDNISONE (STERAPRED UNI-PAK 21 TAB) 5 MG (21) TBPK TABLET    See admin instructions. follow package directions  ? REPATHA 140 MG/ML SOSY    INJECT 1 SYRINGE INTO THE SKIN Q 2 WEEKS  ? SEMAGLUTIDE,0.25 OR 0.'5MG'$ /DOS, (OZEMPIC, 0.25 OR 0.5 MG/DOSE,) 2 MG/1.5ML SOPN    once a week.   ? SPIRONOLACTONE (ALDACTONE) 25 MG TABLET  TAKE 1 TABLET DAILY  ? VITAMIN E 400 UNIT CAPSULE    Take 400 Units by mouth daily.  ?Modified Medications  ? Modified Medication Previous Medication  ? DOLUTEGRAVIR (TIVICAY) 50 MG TABLET dolutegravir (TIVICAY) 50 MG tablet  ?    TAKE 1 TABLET DAILY. TAKE WITH ODEFSEY.    TAKE 1 TABLET DAILY. TAKE WITH ODEFSEY.  ? EMTRICITABINE-RILPIVIR-TENOFOVIR AF (ODEFSEY) 200-25-25 MG TABS TABLET emtricitabine-rilpivir-tenofovir AF (ODEFSEY) 200-25-25 MG TABS tablet  ?    TAKE 1 TABLET DAILY WITH BREAKFAST. TAKE WITH TIVICAY.    TAKE 1 TABLET DAILY WITH BREAKFAST. TAKE WITH TIVICAY.  ?Discontinued Medications  ? No medications on file  ? ? ?Subjective: ?Holly Stephens is in for her routine HIV follow-up visit.  She denies any problems obtaining, taking or tolerating her Odefsey or Tivicay.  As usual, she never misses a single dose.  She has been having increasing problems with right hip pain and is scheduled for right  total hip arthroplasty on May 18.  She is still planning to retire at the end of this year.  She had her last COVID booster vaccine last November. ? ?Review of Systems: ?Review of Systems  ?Constitutional:  Negative for fever and weight loss.  ?Musculoskeletal:  Positive for joint pain.  ? ?Past Medical History:  ?Diagnosis Date  ? Acute deep vein thrombosis (DVT) of popliteal vein of left lower extremity ([place]) 02/09/2016  ? Arthritis   ? osteoarthritis  ? Diabetes mellitus without complication ([place])   ? History of radiation therapy 04/25/2016 - 05/23/2016  ? Left Axilla treated to 36 Gy in 20 fractions  ? HIV infection ([place])   ? 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 Human immunodeficiency virus (HIV) disease ([place]) 02/23/2006  ? Qualifier: Diagnosis of  By: Megan Salon MD, Jolita Haefner    ? ? ?Social History  ? ?Tobacco Use  ? Smoking status: Never  ? Smokeless tobacco: Never  ?Vaping Use  ? Vaping Use: Never used  ?Substance Use Topics  ? Alcohol use: No  ? Drug use: No  ? ? ?Family History  ?Problem Relation Age of Onset  ? Hypertension Mother   ? Cancer Mother 66  ?     breast   ? Hypertension Father   ? Heart attack Father   ? Pulmonary embolism Sister   ? Parkinson's disease Brother   ? ? ?Allergies  ?Allergen Reactions  ? Augmentin [Amoxicillin-Pot Clavulanate] Hives and Other (See Comments)  ?  Has patient had a PCN reaction causing immediate rash, facial/tongue/throat swelling, SOB or lightheadedness with hypotension: No ?Has patient had a PCN reaction causing severe rash involving mucus membranes or skin necrosis: No ?Has patient had a PCN reaction that required hospitalization No ?Has patient had a PCN reaction occurring within the last 10 years: No ?If all of the above answers are "NO", then may proceed with Cephalosporin use.  ? Bleomycin Cough  ? Cefpodoxime Proxetil Itching and Rash  ? Doxycycline Rash  ? ? ?Health Maintenance  ?Topic Date Due  ? HEMOGLOBIN A1C  Never done  ? OPHTHALMOLOGY EXAM   Never done  ? URINE MICROALBUMIN  Never done  ? Zoster Vaccines- Shingrix (1 of 2) Never done  ? COLONOSCOPY (Pts 45-49yr Insurance coverage will need to be confirmed)  Never done  ? Pneumonia Vaccine 66 Years old (3 - PCV) 11/15/2011  ? COVID-19 Vaccine (3 - Moderna risk series) 07/04/2019  ? MAMMOGRAM  07/24/2021  ? FOOT EXAM  01/03/2022  ?  TETANUS/TDAP  11/06/2022  ? PAP SMEAR-Modifier  11/11/2022  ? DEXA SCAN  Completed  ? Hepatitis C Screening  Completed  ? HIV Screening  Completed  ? HPV VACCINES  Aged Out  ? ? ?Objective: ? ?Vitals:  ? 06/15/21 1040  ?BP: 114/70  ?Pulse: 66  ?Temp: 97.7 ?F (36.5 ?C)  ?TempSrc: Oral  ?Weight: 203 lb (92.1 kg)  ? ?Body mass index is 39.65 kg/m?. ? ?Physical Exam ?Constitutional:   ?   Comments: She is in good spirits.  ?Cardiovascular:  ?   Rate and Rhythm: Normal rate and regular rhythm.  ?   Heart sounds: No murmur heard. ?Pulmonary:  ?   Effort: Pulmonary effort is normal.  ?   Breath sounds: Normal breath sounds.  ?Neurological:  ?   Gait: Gait abnormal.  ?   Comments: She walks with the aid of a cane.  ?Psychiatric:     ?   Mood and Affect: Mood normal.  ? ? ?Lab Results ?Lab Results  ?Component Value Date  ? WBC 7.6 05/26/2021  ? HGB 16.9 (H) 05/26/2021  ? HCT 50.2 (H) 05/26/2021  ? MCV 91.1 05/26/2021  ? PLT 268 05/26/2021  ?  ?Lab Results  ?Component Value Date  ? CREATININE 0.62 05/26/2021  ? BUN 17 05/26/2021  ? NA 140 05/26/2021  ? K 4.7 05/26/2021  ? CL 101 05/26/2021  ? CO2 27 05/26/2021  ?  ?Lab Results  ?Component Value Date  ? ALT 19 05/26/2021  ? AST 22 05/26/2021  ? ALKPHOS 41 12/23/2020  ? BILITOT 0.4 05/26/2021  ?  ?Lab Results  ?Component Value Date  ? CHOL 115 05/26/2021  ? HDL 53 05/26/2021  ? LDLCALC 38 05/26/2021  ? TRIG 160 (H) 05/26/2021  ? CHOLHDL 2.2 05/26/2021  ? ?Lab Results  ?Component Value Date  ? LABRPR NON-REACTIVE 05/26/2021  ? ?HIV 1 RNA Quant  ?Date Value  ?05/26/2021 NOT DETECTED copies/mL  ?05/26/2020 Not Detected Copies/mL   ?09/04/2019 55 copies/mL (H)  ? ?CD4 T Cell Abs (/uL)  ?Date Value  ?05/26/2021 662  ?05/26/2020 850  ?09/04/2019 984  ? ?  ?Problem List Items Addressed This Visit   ? ?  ? High  ? Human immunodeficiency virus (HIV) di

## 2021-06-15 NOTE — Assessment & Plan Note (Signed)
Her infection remains under excellent, long-term control.  She will continue her current antiretroviral regimen and follow-up in November before her transition to Medicare. ?

## 2021-06-20 DIAGNOSIS — E119 Type 2 diabetes mellitus without complications: Secondary | ICD-10-CM | POA: Diagnosis not present

## 2021-06-24 ENCOUNTER — Encounter (HOSPITAL_COMMUNITY): Payer: Self-pay

## 2021-06-24 ENCOUNTER — Ambulatory Visit (HOSPITAL_COMMUNITY)
Admission: RE | Admit: 2021-06-24 | Discharge: 2021-06-24 | Disposition: A | Discharge status: Home / Self Care | Payer: BC Managed Care – PPO | Source: Ambulatory Visit | Attending: Physician Assistant | Admitting: Physician Assistant

## 2021-06-24 ENCOUNTER — Inpatient Hospital Stay (HOSPITAL_COMMUNITY): Payer: BC Managed Care – PPO | Attending: Hematology

## 2021-06-24 DIAGNOSIS — G479 Sleep disorder, unspecified: Secondary | ICD-10-CM | POA: Insufficient documentation

## 2021-06-24 DIAGNOSIS — R634 Abnormal weight loss: Secondary | ICD-10-CM | POA: Insufficient documentation

## 2021-06-24 DIAGNOSIS — Z1211 Encounter for screening for malignant neoplasm of colon: Secondary | ICD-10-CM | POA: Diagnosis not present

## 2021-06-24 DIAGNOSIS — M47816 Spondylosis without myelopathy or radiculopathy, lumbar region: Secondary | ICD-10-CM | POA: Diagnosis not present

## 2021-06-24 DIAGNOSIS — Z86718 Personal history of other venous thrombosis and embolism: Secondary | ICD-10-CM | POA: Insufficient documentation

## 2021-06-24 DIAGNOSIS — C8118 Nodular sclerosis classical Hodgkin lymphoma, lymph nodes of multiple sites: Secondary | ICD-10-CM | POA: Diagnosis not present

## 2021-06-24 DIAGNOSIS — Z880 Allergy status to penicillin: Secondary | ICD-10-CM | POA: Insufficient documentation

## 2021-06-24 DIAGNOSIS — Z713 Dietary counseling and surveillance: Secondary | ICD-10-CM | POA: Diagnosis not present

## 2021-06-24 DIAGNOSIS — K439 Ventral hernia without obstruction or gangrene: Secondary | ICD-10-CM | POA: Diagnosis not present

## 2021-06-24 DIAGNOSIS — Z803 Family history of malignant neoplasm of breast: Secondary | ICD-10-CM | POA: Insufficient documentation

## 2021-06-24 DIAGNOSIS — M25551 Pain in right hip: Secondary | ICD-10-CM | POA: Insufficient documentation

## 2021-06-24 DIAGNOSIS — Z8249 Family history of ischemic heart disease and other diseases of the circulatory system: Secondary | ICD-10-CM | POA: Insufficient documentation

## 2021-06-24 DIAGNOSIS — Z79899 Other long term (current) drug therapy: Secondary | ICD-10-CM | POA: Insufficient documentation

## 2021-06-24 DIAGNOSIS — C8590 Non-Hodgkin lymphoma, unspecified, unspecified site: Secondary | ICD-10-CM | POA: Diagnosis not present

## 2021-06-24 DIAGNOSIS — J984 Other disorders of lung: Secondary | ICD-10-CM | POA: Diagnosis not present

## 2021-06-24 DIAGNOSIS — C8114 Nodular sclerosis classical Hodgkin lymphoma, lymph nodes of axilla and upper limb: Secondary | ICD-10-CM | POA: Insufficient documentation

## 2021-06-24 DIAGNOSIS — I2510 Atherosclerotic heart disease of native coronary artery without angina pectoris: Secondary | ICD-10-CM | POA: Diagnosis not present

## 2021-06-24 DIAGNOSIS — K5900 Constipation, unspecified: Secondary | ICD-10-CM | POA: Insufficient documentation

## 2021-06-24 DIAGNOSIS — Z6838 Body mass index (BMI) 38.0-38.9, adult: Secondary | ICD-10-CM | POA: Diagnosis not present

## 2021-06-24 DIAGNOSIS — K429 Umbilical hernia without obstruction or gangrene: Secondary | ICD-10-CM | POA: Diagnosis not present

## 2021-06-24 DIAGNOSIS — Z881 Allergy status to other antibiotic agents status: Secondary | ICD-10-CM | POA: Insufficient documentation

## 2021-06-24 DIAGNOSIS — E669 Obesity, unspecified: Secondary | ICD-10-CM | POA: Diagnosis not present

## 2021-06-24 DIAGNOSIS — Z818 Family history of other mental and behavioral disorders: Secondary | ICD-10-CM | POA: Insufficient documentation

## 2021-06-24 DIAGNOSIS — R5383 Other fatigue: Secondary | ICD-10-CM | POA: Insufficient documentation

## 2021-06-24 LAB — VITAMIN D 25 HYDROXY (VIT D DEFICIENCY, FRACTURES): Vit D, 25-Hydroxy: 47.47 ng/mL (ref 30–100)

## 2021-06-24 LAB — COMPREHENSIVE METABOLIC PANEL
ALT: 21 U/L (ref 0–44)
AST: 19 U/L (ref 15–41)
Albumin: 4.5 g/dL (ref 3.5–5.0)
Alkaline Phosphatase: 44 U/L (ref 38–126)
Anion gap: 9 (ref 5–15)
BUN: 20 mg/dL (ref 8–23)
CO2: 28 mmol/L (ref 22–32)
Calcium: 10.2 mg/dL (ref 8.9–10.3)
Chloride: 104 mmol/L (ref 98–111)
Creatinine, Ser: 0.53 mg/dL (ref 0.44–1.00)
GFR, Estimated: >60 mL/min (ref >60)
Glucose, Bld: 108 mg/dL — ABNORMAL HIGH (ref 70–99)
Potassium: 4.4 mmol/L (ref 3.5–5.1)
Sodium: 141 mmol/L (ref 135–145)
Total Bilirubin: 0.5 mg/dL (ref 0.3–1.2)
Total Protein: 7.9 g/dL (ref 6.5–8.1)

## 2021-06-24 LAB — CBC WITH DIFFERENTIAL/PLATELET
Abs Immature Granulocytes: 0.02 10*3/uL (ref 0.00–0.07)
Basophils Absolute: 0.1 10*3/uL (ref 0.0–0.1)
Basophils Relative: 1 %
Eosinophils Absolute: 0.1 10*3/uL (ref 0.0–0.5)
Eosinophils Relative: 2 %
HCT: 51.4 % — ABNORMAL HIGH (ref 36.0–46.0)
Hemoglobin: 16.6 g/dL — ABNORMAL HIGH (ref 12.0–15.0)
Immature Granulocytes: 0 %
Lymphocytes Relative: 28 %
Lymphs Abs: 2.2 10*3/uL (ref 0.7–4.0)
MCH: 30.7 pg (ref 26.0–34.0)
MCHC: 32.3 g/dL (ref 30.0–36.0)
MCV: 95 fL (ref 80.0–100.0)
Monocytes Absolute: 0.6 10*3/uL (ref 0.1–1.0)
Monocytes Relative: 7 %
Neutro Abs: 4.8 10*3/uL (ref 1.7–7.7)
Neutrophils Relative %: 62 %
Platelets: 245 10*3/uL (ref 150–400)
RBC: 5.41 MIL/uL — ABNORMAL HIGH (ref 3.87–5.11)
RDW: 13.5 % (ref 11.5–15.5)
WBC: 7.8 10*3/uL (ref 4.0–10.5)
nRBC: 0 % (ref 0.0–0.2)

## 2021-06-24 LAB — LACTATE DEHYDROGENASE: LDH: 112 U/L (ref 98–192)

## 2021-06-24 MED ORDER — IOHEXOL 300 MG/ML  SOLN
100.0000 mL | Freq: Once | INTRAMUSCULAR | Status: AC | PRN
  Administered 2021-06-24: 100 mL via INTRAVENOUS

## 2021-06-25 LAB — PTH, INTACT AND CALCIUM
Calcium, Total (PTH): 10.5 mg/dL — ABNORMAL HIGH (ref 8.7–10.3)
PTH: 13 pg/mL — ABNORMAL LOW (ref 15–65)

## 2021-06-27 ENCOUNTER — Ambulatory Visit: Payer: Self-pay | Admitting: Student

## 2021-06-27 NOTE — H&P (View-Only) (Signed)
TOTAL HIP ADMISSION H&P  Patient is admitted for right total hip arthroplasty.  Subjective:  Chief Complaint: right hip pain  HPI: Holly Stephens, 66 y.o. female, has a history of pain and functional disability in the right hip(s) due to arthritis and patient has failed non-surgical conservative treatments for greater than 12 weeks to include NSAID's and/or analgesics, flexibility and strengthening excercises, use of assistive devices, weight reduction as appropriate, and activity modification.  Onset of symptoms was gradual starting 8 years ago with rapidlly worsening course since that time.The patient noted no past surgery on the right hip(s).  Patient currently rates pain in the right hip at 8 out of 10 with activity. Patient has night pain, worsening of pain with activity and weight bearing, trendelenberg gait, pain that interfers with activities of daily living, and pain with passive range of motion. Patient has evidence of subchondral cysts, subchondral sclerosis, periarticular osteophytes, and joint space narrowing by imaging studies. This condition presents safety issues increasing the risk of falls. There is no current active infection.  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   Acute deep vein thrombosis (DVT) of popliteal vein of left lower extremity ([place]) 02/09/2016   Congestive heart failure with cardiomyopathy ([place]) 10/27/2015   Acute congestive heart failure ([place]) 10/27/2015   Hodgkin's disease, nodular sclerosis, of lymph nodes of multiple sites ([place]) 08/23/2015   Visual floaters 09/12/2011   Episodic low back pain 11/15/2010   ROTATOR CUFF INJURY, RIGHT SHOULDER 11/03/2008   ADHESIVE CAPSULITIS OF SHOULDER 11/12/2007   OBESITY NOS 05/17/2006    SYNDROME, CARPAL TUNNEL 05/17/2006   FOOT PAIN 05/17/2006   Human immunodeficiency virus (HIV) disease ([place]) 02/23/2006   Hyperlipidemia 02/23/2006   DEPRESSION 02/23/2006   Essential hypertension 02/23/2006   Past Medical History:  Diagnosis Date   Acute deep vein thrombosis (DVT) of popliteal vein of left lower extremity ([place]) 02/09/2016   Arthritis    osteoarthritis   Diabetes mellitus without complication ([place])    History of radiation therapy 04/25/2016 - 05/23/2016   Left Axilla treated to 36 Gy in 20 fractions   HIV infection ([place])    [name], nodular sclerosis ([place]) 08/23/2015   Human immunodeficiency virus (HIV) disease ([place]) 02/23/2006   Qualifier: Diagnosis of  By: Megan Salon MD, John      Past Surgical History:  Procedure Laterality Date   AXILLARY LYMPH NODE BIOPSY Left 07/23/2015   Procedure: EXCISIONAL BIOPSY LEFT AXILLARY LYMPH NODE;  Surgeon: [name], MD;  Location: [facility];  Service: General;  Laterality: Left;   PORT-A-CATH REMOVAL N/A 01/09/2018   Procedure: MINOR REMOVAL PORT-A-CATH;  Surgeon: Aviva Signs, MD;  Location: [facility];  Service: General;  Laterality: N/A;   PORTACATH PLACEMENT Right 09/08/2015   Procedure: INSERTION OF CENTRAL VENOUS CATHETER WITH PORT FOR CHEMOTHERAPY;  Surgeon: [name], MD;  Location: [facility];  Service: General;  Laterality: Right;   SHOULDER SURGERY Right    removal of bone spur    Current Outpatient Medications  Medication Sig Dispense Refill Last Dose   acetaminophen (TYLENOL) 650 MG CR tablet Take 1,300 mg by mouth every 8 (eight) hours as needed for pain.      buPROPion (WELLBUTRIN XL) 150 MG 24 hr tablet Take by mouth.  cholecalciferol (VITAMIN D) 1000 units tablet Take 1,000 Units by mouth daily.       clindamycin (CLEOCIN) 150 MG capsule Take 150 mg by mouth 3 (three) times daily.      dolutegravir (TIVICAY) 50 MG tablet TAKE 1 TABLET DAILY. TAKE WITH ODEFSEY. 30 tablet 11    doxycycline  (VIBRA-TABS) 100 MG tablet Take 100 mg by mouth 2 (two) times daily.      emtricitabine-rilpivir-tenofovir AF (ODEFSEY) 200-25-25 MG TABS tablet TAKE 1 TABLET DAILY WITH BREAKFAST. TAKE WITH TIVICAY. 30 tablet 11    famotidine (PEPCID) 20 MG tablet Take 20 mg by mouth 2 (two) times daily.      fenofibrate 160 MG tablet Take 160 mg by mouth daily.       fexofenadine (ALLEGRA) 180 MG tablet Take 180 mg by mouth daily as needed for allergies.       furosemide (LASIX) 20 MG tablet Take 1 tablet (20 mg total) by mouth daily. 90 tablet 2    GLUCOSAMINE-CHONDROITIN PO Take 2 tablets by mouth daily.       hydrOXYzine (ATARAX) 25 MG tablet Take 25 mg by mouth 3 (three) times daily as needed.      ibuprofen (ADVIL) 800 MG tablet TAKE 1 TABLET BY MOUTH THREE TIMES DAILY FOR 3 DAYS THEN TAKE AS NEEDED (Patient not taking: Reported on 12/30/2020)      JARDIANCE 25 MG TABS tablet Take 25 mg by mouth daily.       metFORMIN (GLUCOPHAGE) 500 MG tablet metformin 500 mg tablet      metFORMIN (GLUCOPHAGE-XR) 500 MG 24 hr tablet Take 500 mg by mouth 2 (two) times daily with a meal.       methocarbamol (ROBAXIN) 750 MG tablet Take 750 mg by mouth as needed.       metoprolol tartrate (LOPRESSOR) 25 MG tablet Take 1 tablet (25 mg total) by mouth daily. 30 tablet 0    Multiple Vitamin (MULTIVITAMIN WITH MINERALS) TABS tablet Take 1 tablet by mouth daily.      predniSONE (STERAPRED UNI-PAK 21 TAB) 5 MG (21) TBPK tablet See admin instructions. follow package directions      REPATHA 140 MG/ML SOSY INJECT 1 SYRINGE INTO THE SKIN Q 2 WEEKS      Semaglutide,0.25 or 0.'5MG'$ /DOS, (OZEMPIC, 0.25 OR 0.5 MG/DOSE,) 2 MG/1.5ML SOPN once a week.       spironolactone (ALDACTONE) 25 MG tablet TAKE 1 TABLET DAILY (Patient taking differently: Take 25 mg by mouth daily.) 90 tablet 3    vitamin E 400 UNIT capsule Take 400 Units by mouth daily.      No current facility-administered medications for this visit.   Allergies  Allergen  Reactions   Augmentin [Amoxicillin-Pot Clavulanate] Hives and Other (See Comments)    Has patient had a PCN reaction causing immediate rash, facial/tongue/throat swelling, SOB or lightheadedness with hypotension: No Has patient had a PCN reaction causing severe rash involving mucus membranes or skin necrosis: No Has patient had a PCN reaction that required hospitalization No Has patient had a PCN reaction occurring within the last 10 years: No If all of the above answers are "NO", then may proceed with Cephalosporin use.   Bleomycin Cough   Cefpodoxime Proxetil Itching and Rash   Doxycycline Rash    Social History   Tobacco Use   Smoking status: Never   Smokeless tobacco: Never  Substance Use Topics   Alcohol use: No    Family History  Problem Relation Age  of Onset   Hypertension Mother    Cancer Mother 5       breast    Hypertension Father    Heart attack Father    Pulmonary embolism Sister    Parkinson's disease Brother      Review of Systems  Musculoskeletal:  Positive for arthralgias, gait problem and myalgias.  All other systems reviewed and are negative.  Objective:  Physical Exam Constitutional:      Appearance: Normal appearance. She is obese.  HENT:     Head: Normocephalic and atraumatic.     Mouth/Throat:     Mouth: Mucous membranes are moist.     Pharynx: Oropharynx is clear.  Eyes:     Extraocular Movements: Extraocular movements intact.  Cardiovascular:     Rate and Rhythm: Normal rate and regular rhythm.     Pulses: Normal pulses.     Heart sounds: Normal heart sounds.  Pulmonary:     Effort: Pulmonary effort is normal.     Breath sounds: Normal breath sounds.  Abdominal:     Palpations: Abdomen is soft.  Genitourinary:    Comments: deferred Musculoskeletal:     Cervical back: Normal range of motion and neck supple.     Right hip: Tenderness and bony tenderness present. Decreased range of motion. Decreased strength.  Skin:    General: Skin  is warm and dry.     Capillary Refill: Capillary refill takes less than 2 seconds.  Neurological:     General: No focal deficit present.     Mental Status: She is alert and oriented to person, place, and time.  Psychiatric:        Mood and Affect: Mood normal.        Behavior: Behavior normal.        Thought Content: Thought content normal.        Judgment: Judgment normal.    Vital signs in last 24 hours: '@VSRANGES'$ @  Labs:   Estimated body mass index is 39.65 kg/m as calculated from the following:   Height as of 05/26/20: 5' (1.524 m).   Weight as of 06/15/21: 92.1 kg.   Imaging Review Plain radiographs demonstrate severe degenerative joint disease of the right hip(s). The bone quality appears to be adequate for age and reported activity level.      Assessment/Plan:  End stage arthritis, right hip(s)  The patient history, physical examination, clinical judgement of the provider and imaging studies are consistent with end stage degenerative joint disease of the right hip(s) and total hip arthroplasty is deemed medically necessary. The treatment options including medical management, injection therapy, arthroscopy and arthroplasty were discussed at length. The risks and benefits of total hip arthroplasty were presented and reviewed. The risks due to aseptic loosening, infection, stiffness, dislocation/subluxation,  thromboembolic complications and other imponderables were discussed.  The patient acknowledged the explanation, agreed to proceed with the plan and consent was signed. Patient is being admitted for inpatient treatment for surgery, pain control, PT, OT, prophylactic antibiotics, VTE prophylaxis, progressive ambulation and ADL's and discharge planning.The patient is planning to be discharged home with HEP.   Disposition: Home with brother or sister in-law Planned DVT Prophylaxis: Eliquis DME needed: rolling walker PCP: Eloy End FNP-C, cleared.  Cardiology: Hasn't seen  since 2018 from reaction to chemotherapy. Does not require regular follow-up.  TXA: Yes Allergies:  - Augmentin - Hives - Bleomycin - respiratory distress, cough. - Doxycycline - rash - Cefpodoxime - rash, itching.  Anesthesia Concerns: None  BMI: 39.9?? Last HgbA1c: 5.6 Other: - CHF - [place], [place]- HIV - HTN - History of blood clot - Varicose Veins - Diabetes Instructed patient on which medications to discontinue 5 days prior to surgery. Including vitamins, supplements, and ibuprofen.    Patient's anticipated LOS is less than 2 midnights, meeting these requirements: - Younger than 70 - Lives within 1 hour of care - Has a competent adult at home to recover with post-op recover - NO history of  - Chronic pain requiring opiods  - Diabetes  - Coronary Artery Disease  - Heart failure  - Heart attack  - Stroke  - DVT/VTE  - Cardiac arrhythmia  - Respiratory Failure/COPD  - Renal failure  - Anemia  - Advanced Liver disease

## 2021-06-27 NOTE — H&P (Signed)
TOTAL HIP ADMISSION H&P ? ?Patient is admitted for right total hip arthroplasty. ? ?Subjective: ? ?Chief Complaint: right hip pain ? ?HPI: Holly Stephens, 66 y.o. female, has a history of pain and functional disability in the right hip(s) due to arthritis and patient has failed non-surgical conservative treatments for greater than 12 weeks to include NSAID's and/or analgesics, flexibility and strengthening excercises, use of assistive devices, weight reduction as appropriate, and activity modification.  Onset of symptoms was gradual starting 8 years ago with rapidlly worsening course since that time.The patient noted no past surgery on the right hip(s).  Patient currently rates pain in the right hip at 8 out of 10 with activity. Patient has night pain, worsening of pain with activity and weight bearing, trendelenberg gait, pain that interfers with activities of daily living, and pain with passive range of motion. Patient has evidence of subchondral cysts, subchondral sclerosis, periarticular osteophytes, and joint space narrowing by imaging studies. This condition presents safety issues increasing the risk of falls. There is no current active infection. ? ?Patient Active Problem List  ? Diagnosis Date Noted  ? Routine screening for STI (sexually transmitted infection) 11/11/2019  ? Screening for cervical cancer 11/11/2019  ? Pain in left knee 02/19/2018  ? 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 Acute deep vein thrombosis (DVT) of popliteal vein of left lower extremity ([name]) 02/09/2016  ? Congestive heart failure with cardiomyopathy ([place]) 10/27/2015  ? Acute congestive heart failure ([place]) 10/27/2015  ? Hodgkin's disease, nodular sclerosis, of lymph nodes of multiple sites ([place]) 08/23/2015  ? Visual floaters 09/12/2011  ? Episodic low back pain 11/15/2010  ? ROTATOR CUFF INJURY, RIGHT SHOULDER 11/03/2008  ? ADHESIVE CAPSULITIS OF SHOULDER 11/12/2007  ? OBESITY NOS 05/17/2006   ? SYNDROME, CARPAL TUNNEL 05/17/2006  ? FOOT PAIN 05/17/2006  ? Human immunodeficiency virus (HIV) disease ([place]) 02/23/2006  ? Hyperlipidemia 02/23/2006  ? DEPRESSION 02/23/2006  ? Essential hypertension 02/23/2006  ? ?Past Medical History:  ?Diagnosis Date  ? Acute deep vein thrombosis (DVT) of popliteal vein of left lower extremity ([place]) 02/09/2016  ? Arthritis   ? osteoarthritis  ? Diabetes mellitus without complication ([place])   ? History of radiation therapy 04/25/2016 - 05/23/2016  ? Left Axilla treated to 36 Gy in 20 fractions  ? HIV infection ([place])   ? [name], nodular sclerosis ([place]) 08/23/2015  ? Human immunodeficiency virus (HIV) disease ([place]) 02/23/2006  ? Qualifier: Diagnosis of  By: Megan Salon MD, John    ?  ?Past Surgical History:  ?Procedure Laterality Date  ? AXILLARY LYMPH NODE BIOPSY Left 07/23/2015  ? Procedure: EXCISIONAL BIOPSY LEFT AXILLARY LYMPH NODE;  Surgeon: [name], MD;  Location: [facility];  Service: General;  Laterality: Left;  ? PORT-A-CATH REMOVAL N/A 01/09/2018  ? Procedure: MINOR REMOVAL PORT-A-CATH;  Surgeon: Aviva Signs, MD;  Location: [facility];  Service: General;  Laterality: N/A;  ? PORTACATH PLACEMENT Right 09/08/2015  ? Procedure: INSERTION OF CENTRAL VENOUS CATHETER WITH PORT FOR CHEMOTHERAPY;  Surgeon: [name], MD;  Location: [facility];  Service: General;  Laterality: Right;  ? SHOULDER SURGERY Right   ? removal of bone spur  ?  ?Current Outpatient Medications  ?Medication Sig Dispense Refill Last Dose  ? acetaminophen (TYLENOL) 650 MG CR tablet Take 1,300 mg by mouth every 8 (eight) hours as needed for pain.     ? buPROPion (WELLBUTRIN XL) 150 MG 24 hr tablet Take by mouth.     ?  cholecalciferol (VITAMIN D) 1000 units tablet Take 1,000 Units by mouth daily.      ? clindamycin (CLEOCIN) 150 MG capsule Take 150 mg by mouth 3 (three) times daily.     ? dolutegravir (TIVICAY) 50 MG tablet TAKE 1 TABLET DAILY. TAKE WITH ODEFSEY. 30 tablet 11   ? doxycycline  (VIBRA-TABS) 100 MG tablet Take 100 mg by mouth 2 (two) times daily.     ? emtricitabine-rilpivir-tenofovir AF (ODEFSEY) 200-25-25 MG TABS tablet TAKE 1 TABLET DAILY WITH BREAKFAST. TAKE WITH TIVICAY. 30 tablet 11   ? famotidine (PEPCID) 20 MG tablet Take 20 mg by mouth 2 (two) times daily.     ? fenofibrate 160 MG tablet Take 160 mg by mouth daily.      ? fexofenadine (ALLEGRA) 180 MG tablet Take 180 mg by mouth daily as needed for allergies.      ? furosemide (LASIX) 20 MG tablet Take 1 tablet (20 mg total) by mouth daily. 90 tablet 2   ? GLUCOSAMINE-CHONDROITIN PO Take 2 tablets by mouth daily.      ? hydrOXYzine (ATARAX) 25 MG tablet Take 25 mg by mouth 3 (three) times daily as needed.     ? ibuprofen (ADVIL) 800 MG tablet TAKE 1 TABLET BY MOUTH THREE TIMES DAILY FOR 3 DAYS THEN TAKE AS NEEDED (Patient not taking: Reported on 12/30/2020)     ? JARDIANCE 25 MG TABS tablet Take 25 mg by mouth daily.      ? metFORMIN (GLUCOPHAGE) 500 MG tablet metformin 500 mg tablet     ? metFORMIN (GLUCOPHAGE-XR) 500 MG 24 hr tablet Take 500 mg by mouth 2 (two) times daily with a meal.      ? methocarbamol (ROBAXIN) 750 MG tablet Take 750 mg by mouth as needed.      ? metoprolol tartrate (LOPRESSOR) 25 MG tablet Take 1 tablet (25 mg total) by mouth daily. 30 tablet 0   ? Multiple Vitamin (MULTIVITAMIN WITH MINERALS) TABS tablet Take 1 tablet by mouth daily.     ? predniSONE (STERAPRED UNI-PAK 21 TAB) 5 MG (21) TBPK tablet See admin instructions. follow package directions     ? REPATHA 140 MG/ML SOSY INJECT 1 SYRINGE INTO THE SKIN Q 2 WEEKS     ? Semaglutide,0.25 or 0.'5MG'$ /DOS, (OZEMPIC, 0.25 OR 0.5 MG/DOSE,) 2 MG/1.5ML SOPN once a week.      ? spironolactone (ALDACTONE) 25 MG tablet TAKE 1 TABLET DAILY (Patient taking differently: Take 25 mg by mouth daily.) 90 tablet 3   ? vitamin E 400 UNIT capsule Take 400 Units by mouth daily.     ? ?No current facility-administered medications for this visit.  ? ?Allergies  ?Allergen  Reactions  ? Augmentin [Amoxicillin-Pot Clavulanate] Hives and Other (See Comments)  ?  Has patient had a PCN reaction causing immediate rash, facial/tongue/throat swelling, SOB or lightheadedness with hypotension: No ?Has patient had a PCN reaction causing severe rash involving mucus membranes or skin necrosis: No ?Has patient had a PCN reaction that required hospitalization No ?Has patient had a PCN reaction occurring within the last 10 years: No ?If all of the above answers are "NO", then may proceed with Cephalosporin use.  ? Bleomycin Cough  ? Cefpodoxime Proxetil Itching and Rash  ? Doxycycline Rash  ?  ?Social History  ? ?Tobacco Use  ? Smoking status: Never  ? Smokeless tobacco: Never  ?Substance Use Topics  ? Alcohol use: No  ?  ?Family History  ?Problem Relation Age  of Onset  ? Hypertension Mother   ? Cancer Mother 35  ?     breast   ? Hypertension Father   ? Heart attack Father   ? Pulmonary embolism Sister   ? Parkinson's disease Brother   ?  ? ?Review of Systems  ?Musculoskeletal:  Positive for arthralgias, gait problem and myalgias.  ?All other systems reviewed and are negative. ? ?Objective: ? ?Physical Exam ?Constitutional:   ?   Appearance: Normal appearance. She is obese.  ?HENT:  ?   Head: Normocephalic and atraumatic.  ?   Mouth/Throat:  ?   Mouth: Mucous membranes are moist.  ?   Pharynx: Oropharynx is clear.  ?Eyes:  ?   Extraocular Movements: Extraocular movements intact.  ?Cardiovascular:  ?   Rate and Rhythm: Normal rate and regular rhythm.  ?   Pulses: Normal pulses.  ?   Heart sounds: Normal heart sounds.  ?Pulmonary:  ?   Effort: Pulmonary effort is normal.  ?   Breath sounds: Normal breath sounds.  ?Abdominal:  ?   Palpations: Abdomen is soft.  ?Genitourinary: ?   Comments: deferred ?Musculoskeletal:  ?   Cervical back: Normal range of motion and neck supple.  ?   Right hip: Tenderness and bony tenderness present. Decreased range of motion. Decreased strength.  ?Skin: ?   General: Skin  is warm and dry.  ?   Capillary Refill: Capillary refill takes less than 2 seconds.  ?Neurological:  ?   General: No focal deficit present.  ?   Mental Status: She is alert and oriented to person, place, and time.  ?Psy

## 2021-06-29 NOTE — Progress Notes (Signed)
? ?[facility] ?[address] ?[place], [place] [address] ? ? ?CLINIC:  ?Medical Oncology/Hematology ? ?PCP:  ?Celene Squibb, MD ?[address] ?[place] [address] ?[phone] ? ? ?REASON FOR VISIT:  ?Follow-up for Hodgkin's lymphoma, nodular-sclerosing type ?  ?PRIOR THERAPY:  ?1.  ABVD from 09/13/2015 to 02/23/2016. ?2. XRT to left axilla through 05/23/2016. ?  ?CURRENT THERAPY: Surveillance ? ?INTERVAL HISTORY:  ?Holly Stephens 66 y.o. female returns for routine follow-up of her Hodgkin's lymphoma, nodular sclerosing type.  She was last seen by Tarri Abernethy PA-C on 12/30/2020. ? ?At today's visit, she reports feeling fairly well apart from right hip pain.   ?She is scheduled for right hip replacement next week.  ? ?She continues to lose weight intentionally.  She denies any fevers, chills, or night sweats. ?She has not noticed any new lumps or bumps.  She has not had any new onset extremity swelling. ?She denies any changes in her breathing, bowel, or bladder habits. ? ?Regarding her hypercalcemia, she denies any kidney stones, bone pain, abdominal pain, confusion, lethargy, or abnormal fatigue.  She is no longer taking her calcium supplement. ? ?Regarding her erythrocytosis, she continues to take Lasix and spironolactone.  She denies any erythromelalgia, aquagenic pruritus, or vasomotor symptoms. ? ?She has 75% energy and 100% appetite. She endorses that she is maintaining a stable weight. ? ? ?REVIEW OF SYSTEMS:  ?Review of Systems  ?Constitutional:  Positive for fatigue. Negative for appetite change, chills, diaphoresis, fever and unexpected weight change.  ?HENT:   Negative for lump/mass and nosebleeds.   ?Eyes:  Negative for eye problems.  ?Respiratory:  Negative for cough, hemoptysis and shortness of breath.   ?Cardiovascular:  Negative for chest pain, leg swelling and palpitations.  ?Gastrointestinal:  Positive for constipation. Negative for abdominal pain, blood in stool, diarrhea,  nausea and vomiting.  ?Genitourinary:  Negative for hematuria.   ?Musculoskeletal:  Positive for arthralgias.  ?Skin: Negative.   ?Neurological:  Negative for dizziness, headaches and light-headedness.  ?Hematological:  Does not bruise/bleed easily.  ?Psychiatric/Behavioral:  Positive for sleep disturbance.    ? ? ?PAST MEDICAL/SURGICAL HISTORY:  ?Past Medical History:  ?Diagnosis Date  ? Acute deep vein thrombosis (DVT) of popliteal vein of left lower extremity ([place]) 02/09/2016  ? Arthritis   ? osteoarthritis  ? Diabetes mellitus without complication ([place])   ? History of radiation therapy 04/25/2016 - 05/23/2016  ? Left Axilla treated to 36 Gy in 20 fractions  ? HIV infection ([place])   ? [name], nodular sclerosis ([place]) 08/23/2015  ? Human immunodeficiency virus (HIV) disease ([place]) 02/23/2006  ? Qualifier: Diagnosis of  By: Megan Salon MD, John    ? ?Past Surgical History:  ?Procedure Laterality Date  ? AXILLARY LYMPH NODE BIOPSY Left 07/23/2015  ? Procedure: EXCISIONAL BIOPSY LEFT AXILLARY LYMPH NODE;  Surgeon: [name], MD;  Location: [facility];  Service: General;  Laterality: Left;  ? PORT-A-CATH REMOVAL N/A 01/09/2018  ? Procedure: MINOR REMOVAL PORT-A-CATH;  Surgeon: Aviva Signs, MD;  Location: [facility];  Service: General;  Laterality: N/A;  ? PORTACATH PLACEMENT Right 09/08/2015  ? Procedure: INSERTION OF CENTRAL VENOUS CATHETER WITH PORT FOR CHEMOTHERAPY;  Surgeon: [name], MD;  Location: [facility];  Service: General;  Laterality: Right;  ? SHOULDER SURGERY Right   ? removal of bone spur  ? ? ? ?SOCIAL HISTORY:  ?Social History  ? ?Socioeconomic History  ? Marital status: Widowed  ?  Spouse  name: Not on file  ? Number of children: Not on file  ? Years of education: Not on file  ? Highest education level: Not on file  ?Occupational History  ? Not on file  ?Tobacco Use  ? Smoking status: Never  ? Smokeless tobacco: Never  ?Vaping Use  ? Vaping Use: Never used  ?Substance and Sexual Activity  ? Alcohol  use: No  ? Drug use: No  ? Sexual activity: Never  ?  Comment: declined condoms  ?Other Topics Concern  ? Not on file  ?Social History Narrative  ? Not on file  ? ?Social Determinants of Health  ? ?Financial Resource Strain: Not on file  ?Food Insecurity: Not on file  ?Transportation Needs: Not on file  ?Physical Activity: Not on file  ?Stress: Not on file  ?Social Connections: Not on file  ?Intimate Partner Violence: Not on file  ? ? ?FAMILY HISTORY:  ?Family History  ?Problem Relation Age of Onset  ? Hypertension Mother   ? Cancer Mother 91  ?     breast   ? Hypertension Father   ? Heart attack Father   ? Pulmonary embolism Sister   ? Parkinson's disease Brother   ? ? ?CURRENT MEDICATIONS:  ?Outpatient Encounter Medications as of 06/30/2021  ?Medication Sig  ? acetaminophen (TYLENOL) 650 MG CR tablet Take 1,300 mg by mouth every 8 (eight) hours as needed for pain.  ? buPROPion (WELLBUTRIN XL) 150 MG 24 hr tablet Take 300 mg by mouth daily.  ? cholecalciferol (VITAMIN D) 1000 units tablet Take 1,000 Units by mouth daily.   ? dolutegravir (TIVICAY) 50 MG tablet TAKE 1 TABLET DAILY. TAKE WITH ODEFSEY.  ? emtricitabine-rilpivir-tenofovir AF (ODEFSEY) 200-25-25 MG TABS tablet TAKE 1 TABLET DAILY WITH BREAKFAST. TAKE WITH TIVICAY.  ? fenofibrate 160 MG tablet Take 160 mg by mouth daily.   ? furosemide (LASIX) 20 MG tablet Take 1 tablet (20 mg total) by mouth daily.  ? JARDIANCE 25 MG TABS tablet Take 25 mg by mouth daily.   ? Metamucil Fiber CHEW Chew 3 each by mouth daily as needed (constipation).  ? metFORMIN (GLUCOPHAGE) 500 MG tablet Take 500 mg by mouth 2 (two) times daily with a meal.  ? metoprolol tartrate (LOPRESSOR) 25 MG tablet Take 1 tablet (25 mg total) by mouth daily.  ? Multiple Vitamin (MULTIVITAMIN WITH MINERALS) TABS tablet Take 1 tablet by mouth daily.  ? OZEMPIC, 1 MG/DOSE, 4 MG/3ML SOPN Inject 1 mg into the skin every Friday.  ? REPATHA 140 MG/ML SOSY Inject 140 mg into the muscle every 14 (fourteen)  days.  ? spironolactone (ALDACTONE) 25 MG tablet TAKE 1 TABLET DAILY (Patient taking differently: Take 25 mg by mouth daily.)  ? vitamin E 400 UNIT capsule Take 400 Units by mouth daily.  ? ?No facility-administered encounter medications on file as of 06/30/2021.  ? ? ?ALLERGIES:  ?Allergies  ?Allergen Reactions  ? Augmentin [Amoxicillin-Pot Clavulanate] Hives and Other (See Comments)  ?  Has patient had a PCN reaction causing immediate rash, facial/tongue/throat swelling, SOB or lightheadedness with hypotension: No ?Has patient had a PCN reaction causing severe rash involving mucus membranes or skin necrosis: No ?Has patient had a PCN reaction that required hospitalization No ?Has patient had a PCN reaction occurring within the last 10 years: No ?If all of the above answers are "NO", then may proceed with Cephalosporin use.  ? Bleomycin Cough  ? Cefpodoxime Proxetil Itching and Rash  ?  Pt unsure  of allergy   ? Doxycycline Rash  ? ? ? ?PHYSICAL EXAM:  ?ECOG PERFORMANCE STATUS: 1 - Symptomatic but completely ambulatory ? ?There were no vitals filed for this visit. ?There were no vitals filed for this visit. ?Physical Exam ?Constitutional:   ?   Appearance: Normal appearance. She is morbidly obese.  ?HENT:  ?   Head: Normocephalic and atraumatic.  ?   Mouth/Throat:  ?   Mouth: Mucous membranes are moist.  ?Eyes:  ?   Extraocular Movements: Extraocular movements intact.  ?   Pupils: Pupils are equal, round, and reactive to light.  ?Cardiovascular:  ?   Rate and Rhythm: Normal rate and regular rhythm.  ?   Pulses: Normal pulses.  ?   Heart sounds: Normal heart sounds.  ?Pulmonary:  ?   Effort: Pulmonary effort is normal.  ?   Breath sounds: Normal breath sounds.  ?Abdominal:  ?   General: Bowel sounds are normal.  ?   Palpations: Abdomen is soft.  ?   Tenderness: There is no abdominal tenderness.  ?   Comments: No hepatosplenomegaly noted on exam, but exam is somewhat limited due to central abdominal obesity   ?Musculoskeletal:     ?   General: No swelling.  ?   Right lower leg: No edema.  ?   Left lower leg: No edema.  ?Lymphadenopathy:  ?   Head:  ?   Right side of head: No submental, submandibular, tonsillar, preauri

## 2021-06-30 ENCOUNTER — Ambulatory Visit (HOSPITAL_COMMUNITY): Payer: BC Managed Care – PPO | Admitting: Hematology

## 2021-06-30 ENCOUNTER — Inpatient Hospital Stay (HOSPITAL_COMMUNITY): Payer: BC Managed Care – PPO | Admitting: Physician Assistant

## 2021-06-30 VITALS — BP 106/58 | HR 74 | Temp 97.5°F | Resp 16 | Wt 203.0 lb

## 2021-06-30 DIAGNOSIS — R634 Abnormal weight loss: Secondary | ICD-10-CM | POA: Insufficient documentation

## 2021-06-30 DIAGNOSIS — M25551 Pain in right hip: Secondary | ICD-10-CM | POA: Insufficient documentation

## 2021-06-30 DIAGNOSIS — C8114 Nodular sclerosis classical Hodgkin lymphoma, lymph nodes of axilla and upper limb: Secondary | ICD-10-CM | POA: Insufficient documentation

## 2021-06-30 DIAGNOSIS — K5900 Constipation, unspecified: Secondary | ICD-10-CM | POA: Diagnosis not present

## 2021-06-30 DIAGNOSIS — Z803 Family history of malignant neoplasm of breast: Secondary | ICD-10-CM | POA: Diagnosis not present

## 2021-06-30 DIAGNOSIS — Z8249 Family history of ischemic heart disease and other diseases of the circulatory system: Secondary | ICD-10-CM | POA: Diagnosis not present

## 2021-06-30 DIAGNOSIS — C8118 Nodular sclerosis classical Hodgkin lymphoma, lymph nodes of multiple sites: Secondary | ICD-10-CM

## 2021-06-30 DIAGNOSIS — Z881 Allergy status to other antibiotic agents status: Secondary | ICD-10-CM | POA: Diagnosis not present

## 2021-06-30 DIAGNOSIS — G479 Sleep disorder, unspecified: Secondary | ICD-10-CM | POA: Diagnosis not present

## 2021-06-30 DIAGNOSIS — Z818 Family history of other mental and behavioral disorders: Secondary | ICD-10-CM | POA: Insufficient documentation

## 2021-06-30 DIAGNOSIS — R5383 Other fatigue: Secondary | ICD-10-CM | POA: Insufficient documentation

## 2021-06-30 DIAGNOSIS — Z880 Allergy status to penicillin: Secondary | ICD-10-CM | POA: Diagnosis not present

## 2021-06-30 DIAGNOSIS — Z79899 Other long term (current) drug therapy: Secondary | ICD-10-CM | POA: Insufficient documentation

## 2021-06-30 DIAGNOSIS — Z86718 Personal history of other venous thrombosis and embolism: Secondary | ICD-10-CM | POA: Insufficient documentation

## 2021-06-30 NOTE — Patient Instructions (Addendum)
DUE TO COVID-19 ONLY TWO VISITORS  (aged 66 and older)  ARE ALLOWED TO COME WITH YOU AND STAY IN THE WAITING ROOM ONLY DURING PRE OP AND PROCEDURE.   ?**NO VISITORS ARE ALLOWED IN THE SHORT STAY AREA OR RECOVERY ROOM!!** ? ?IF YOU WILL BE ADMITTED INTO THE HOSPITAL YOU ARE ALLOWED ONLY FOUR SUPPORT PEOPLE DURING VISITATION HOURS ONLY (7 AM -8PM)   ?The support person(s) must pass our screening, gel in and out, and wear a mask at all times, including in the patient?s room. ?Patients must also wear a mask when staff or their support person are in the room. ?Visitors GUEST BADGE MUST BE WORN VISIBLY  ?One adult visitor may remain with you overnight and MUST be in the room by 8 P.M. ?  ? ? Your procedure is scheduled on: 07/07/21 ? ? Report to [facility] Main Entrance ? ?  Report to admitting at 5:15 AM ? ? Call this number if you have problems the morning of surgery [phone] ? ? Do not eat food :After Midnight. ? ? After Midnight you may have the following liquids until 4:30 AM DAY OF SURGERY ? ?Water ?Black Coffee (sugar ok, NO MILK/CREAM OR CREAMERS)  ?Tea (sugar ok, NO MILK/CREAM OR CREAMERS) regular and decaf                             ?Plain Jell-O (NO RED)                                           ?Fruit ices (not with fruit pulp, NO RED)                                     ?Popsicles (NO RED)                                                                  ?Juice: apple, WHITE grape, WHITE cranberry ?Sports drinks like Gatorade (NO RED) ?Clear broth(vegetable,chicken,beef) ? ?              ?The day of surgery:  ?Drink ONE (1) Pre-Surgery G2 at 4:30 AM the morning of surgery. Drink in one sitting. Do not sip.  ?This drink was given to you during your hospital  ?pre-op appointment visit. ?Nothing else to drink after completing the  ?Pre-Surgery G2. ?  ?       If you have questions, please contact your surgeon?s office. ? ? ?FOLLOW BOWEL PREP AND ANY ADDITIONAL PRE OP INSTRUCTIONS YOU RECEIVED FROM  YOUR SURGEON'S OFFICE!!! ?  ?  ?Oral Hygiene is also important to reduce your risk of infection.                                    ?Remember - BRUSH YOUR TEETH THE MORNING OF SURGERY WITH YOUR REGULAR TOOTHPASTE ? ? Take these medicines the morning of surgery with A SIP OF WATER: Tylenol, Bupropion, Tivicay, Odeffsey,  Fenofibrate, Metoprolol.  ? ?DO NOT TAKE ANY ORAL DIABETIC MEDICATIONS DAY OF YOUR SURGERY ? ?How to Manage Your Diabetes ?Before and After Surgery ? ?Why is it important to control my blood sugar before and after surgery? ?Improving blood sugar levels before and after surgery helps healing and can limit problems. ?A way of improving blood sugar control is eating a healthy diet by: ? Eating less sugar and carbohydrates ? Increasing activity/exercise ? Talking with your doctor about reaching your blood sugar goals ?High blood sugars (greater than 180 mg/dL) can raise your risk of infections and slow your recovery, so you will need to focus on controlling your diabetes during the weeks before surgery. ?Make sure that the doctor who takes care of your diabetes knows about your planned surgery including the date and location. ? ?How do I manage my blood sugar before surgery? ?Check your blood sugar at least 4 times a day, starting 2 days before surgery, to make sure that the level is not too high or low. ?Check your blood sugar the morning of your surgery when you wake up and every 2 hours until you get to the Short Stay unit. ?If your blood sugar is less than 70 mg/dL, you will need to treat for low blood sugar: ?Do not take insulin. ?Treat a low blood sugar (less than 70 mg/dL) with ? cup of clear juice (cranberry or apple), 4 glucose tablets, OR glucose gel. ?Recheck blood sugar in 15 minutes after treatment (to make sure it is greater than 70 mg/dL). If your blood sugar is not greater than 70 mg/dL on recheck, call [phone] for further instructions. ?Report your blood sugar to the short stay nurse  when you get to Short Stay. ? ?If you are admitted to the hospital after surgery: ?Your blood sugar will be checked by the staff and you will probably be given insulin after surgery (instead of oral diabetes medicines) to make sure you have good blood sugar levels. ?The goal for blood sugar control after surgery is 80-180 mg/dL. ? ? ?WHAT DO I DO ABOUT MY DIABETES MEDICATION? ? ?Do not take oral diabetes medicines (pills) the morning of surgery. ? ?THE DAY BEFORE SURGERY, take  Metformin as prescribed. Do not take Jardiance.      ? ? ?THE MORNING OF SURGERY, do not take Metformin or Jardiance. ? ?The day of surgery, do not take other diabetes injectables, including Byetta (exenatide), Bydureon (exenatide ER), Victoza (liraglutide), or Trulicity (dulaglutide). ? ?Reviewed and Endorsed by [facility] Patient Education Committee, August 2015  ?                  ?           You may not have any metal on your body including hair pins, jewelry, and body piercing ? ?           Do not wear make-up, lotions, powders, perfumes, or deodorant ? ?Do not wear nail polish including gel and S&S, artificial/acrylic nails, or any other type of covering on natural nails including finger and toenails. If you have artificial nails, gel coating, etc. that needs to be removed by a nail salon please have this removed prior to surgery or surgery may need to be canceled/ delayed if the surgeon/ anesthesia feels like they are unable to be safely monitored.  ? ?Do not shave  48 hours prior to surgery.  ? ? Do not bring valuables to the hospital. [facility] NOT ?  RESPONSIBLE   FOR VALUABLES. ? ? Bring small overnight bag day of surgery. ?  ? Special Instructions: Bring a copy of your healthcare power of attorney and living will documents         the day of surgery if you haven't scanned them before. ? ?            Please read over the following fact sheets you were given: IF [place] [phone]- Apolonio Schneiders  ? ?   [facility] - Preparing for Surgery ?Before surgery, you can play an important role.  Because skin is not sterile, your skin needs to be as free of germs as possible.  You can reduce the number of germs on your skin by washing with CHG (chlorahexidine gluconate) soap before surgery.  CHG is an antiseptic cleaner which kills germs and bonds with the skin to continue killing germs even after washing. ?Please DO NOT use if you have an allergy to CHG or antibacterial soaps.  If your skin becomes reddened/irritated stop using the CHG and inform your nurse when you arrive at Short Stay. ?Do not shave (including legs and underarms) for at least 48 hours prior to the first CHG shower.  You may shave your face/neck. ? ?Please follow these instructions carefully: ? 1.  Shower with CHG Soap the night before surgery and the  morning of surgery. ? 2.  If you choose to wash your hair, wash your hair first as usual with your normal  shampoo. ? 3.  After you shampoo, rinse your hair and body thoroughly to remove the shampoo.                            ? 4.  Use CHG as you would any other liquid soap.  You can apply chg directly to the skin and wash.  Gently with a scrungie or clean washcloth. ? 5.  Apply the CHG Soap to your body ONLY FROM THE NECK DOWN.   Do   not use on face/ open      ?                     Wound or open sores. Avoid contact with eyes, ears mouth and   genitals (private parts).  ?                     Production manager,  Genitals (private parts) with your normal soap. ?            6.  Wash thoroughly, paying special attention to the area where your    surgery  will be performed. ? 7.  Thoroughly rinse your body with warm water from the neck down. ? 8.  DO NOT shower/wash with your normal soap after using and rinsing off the CHG Soap. ?               9.  Pat yourself dry with a clean towel. ?           10.  Wear clean pajamas. ?           11.  Place clean sheets on your bed the night  of your first shower and do not  sleep with pets. ?Day of Surgery : ?Do not apply any lotions/deodorants the morning of surgery.  Please wear clean clothes to the hospital/surgery center. ? ?FAILURE TO  FOL

## 2021-06-30 NOTE — Progress Notes (Signed)
?  ? ?Cardiology Office Note ? ? ?Date:  07/01/2021  ? ?ID:  Holly Stephens, DOB 09/05/1955, MRN [id number] ? ?PCP:  Celene Squibb, MD  ?Cardiologist:   None ?Referring:  Celene Squibb, MD ? ? ?Chief Complaint  ?Patient presents with  ? Pre-op Exam  ? ? ?  ?History of Present Illness: ?Holly Stephens is a 66 y.o. female who is referred by Larene Pickett PA-C for preop evaluation.  She is going to have right hip replacement.  he did see Dr. Haroldine Laws in 2018.  She had adriamycin and was followed up with an echo that demonstrated 55- 60%.   I reviewed these old records for this visit.  She did have some heart failure symptoms. ? ?However, since 2018 she has had no new cardiovascular problems.  She denies any chest pressure, neck or arm discomfort.  She denies any shortness of breath, PND or orthopnea.  She gets around with a cane because of her joint problems.  However, she is not limited from a cardiovascular standpoint.  She does not feel palpitations, presyncope or syncope.  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 ? ? ?Past Medical History:  ?Diagnosis Date  ? Acute deep vein thrombosis (DVT) of popliteal vein of left lower extremity ([place]) 02/09/2016  ? Arthritis   ? osteoarthritis  ? CHF (congestive heart failure) ([place])   ? EF NL  ? Diabetes mellitus without complication ([place])   ? History of radiation therapy 04/25/2016 - 05/23/2016  ? Left Axilla treated to 36 Gy in 20 fractions  ? [name], nodular sclerosis ([place]) 08/23/2015  ? Human immunodeficiency virus (HIV) disease ([place]) 02/23/2006  ? Qualifier: Diagnosis of  By: Megan Salon MD, John    ? Hypertension   ? ? ?Past Surgical History:  ?Procedure Laterality Date  ? AXILLARY LYMPH NODE BIOPSY Left 07/23/2015  ? Procedure: EXCISIONAL BIOPSY LEFT AXILLARY LYMPH NODE;  Surgeon: [name], MD;  Location: [facility];  Service: General;  Laterality: Left;  ? PORT-A-CATH REMOVAL N/A 01/09/2018  ? Procedure:  MINOR REMOVAL PORT-A-CATH;  Surgeon: Aviva Signs, MD;  Location: [facility];  Service: General;  Laterality: N/A;  ? PORTACATH PLACEMENT Right 09/08/2015  ? Procedure: INSERTION OF CENTRAL VENOUS CATHETER WITH PORT FOR CHEMOTHERAPY;  Surgeon: [name], MD;  Location: [facility];  Service: General;  Laterality: Right;  ? SHOULDER SURGERY Right   ? removal of bone spur  ? WISDOM TOOTH EXTRACTION    ? ? ? ?Current Outpatient Medications  ?Medication Sig Dispense Refill  ? acetaminophen (TYLENOL) 650 MG CR tablet Take 1,300 mg by mouth every 8 (eight) hours as needed for pain.    ? buPROPion (WELLBUTRIN XL) 150 MG 24 hr tablet Take 300 mg by mouth daily.    ? cholecalciferol (VITAMIN D) 1000 units tablet Take 1,000 Units by mouth daily.     ? dolutegravir (TIVICAY) 50 MG tablet TAKE 1 TABLET DAILY. TAKE WITH ODEFSEY. 30 tablet 11  ? emtricitabine-rilpivir-tenofovir AF (ODEFSEY) 200-25-25 MG TABS tablet TAKE 1 TABLET DAILY WITH BREAKFAST. TAKE WITH TIVICAY. 30 tablet 11  ? fenofibrate 160 MG tablet Take 160 mg by mouth daily.     ? furosemide (LASIX) 20 MG tablet Take 1 tablet (20 mg total) by mouth daily. 90 tablet 2  ? JARDIANCE 25 MG TABS tablet Take 25 mg by mouth daily.     ?  Metamucil Fiber CHEW Chew 3 each by mouth daily as needed (constipation).    ? metFORMIN (GLUCOPHAGE) 500 MG tablet Take 500 mg by mouth 2 (two) times daily with a meal.    ? metoprolol tartrate (LOPRESSOR) 25 MG tablet Take 1 tablet (25 mg total) by mouth daily. 30 tablet 0  ? Multiple Vitamin (MULTIVITAMIN WITH MINERALS) TABS tablet Take 1 tablet by mouth daily.    ? OZEMPIC, 1 MG/DOSE, 4 MG/3ML SOPN Inject 1 mg into the skin every Friday.    ? REPATHA 140 MG/ML SOSY Inject 140 mg into the muscle every 14 (fourteen) days.    ? spironolactone (ALDACTONE) 25 MG tablet TAKE 1 TABLET DAILY (Patient taking differently: Take 25 mg by mouth daily.) 90 tablet 3  ? vitamin E 400 UNIT capsule Take 400 Units by mouth daily.    ? ?No current  facility-administered medications for this visit.  ? ? ?Allergies:   Augmentin [amoxicillin-pot clavulanate], Bleomycin, Cefpodoxime proxetil, and Doxycycline  ? ? ?Social History:  The patient  reports that she has never smoked. She has never used smokeless tobacco. She reports that she does not drink alcohol and does not use drugs.  ? ?Family History:  The patient's family history includes Cancer (age of onset: 2) in her mother; Heart attack (age of onset: 66) in her father; Hypertension in her father and mother; Parkinson's disease in her brother; Pulmonary embolism in her sister.  ? ? ?ROS:  Please see the history of present illness.   Otherwise, review of systems are positive for none.   All other systems are reviewed and negative.  ? ? ?PHYSICAL EXAM: ?VS:  BP (!) 98/54 (BP Location: Right Arm, Patient Position: Sitting, Cuff Size: Normal)   Pulse 75   Ht '5\' 1"'$  (1.549 m)   Wt 202 lb (91.6 kg)   BMI 38.17 kg/m?  , BMI Body mass index is 38.17 kg/m?. ?GENERAL:  Well appearing ?HEENT:  Pupils equal round and reactive, fundi not visualized, oral mucosa unremarkable ?NECK:  No jugular venous distention, waveform within normal limits, carotid upstroke brisk and symmetric, no bruits, no thyromegaly ?LYMPHATICS:  No cervical, inguinal adenopathy ?LUNGS:  Clear to auscultation bilaterally ?BACK:  No CVA tenderness ?CHEST:  Unremarkable ?HEART:  PMI not displaced or sustained,S1 and S2 within normal limits, no S3, no S4, no clicks, no rubs, no murmurs ?ABD:  Flat, positive bowel sounds normal in frequency in pitch, no bruits, no rebound, no guarding, no midline pulsatile mass, no hepatomegaly, no splenomegaly ?EXT:  2 plus pulses throughout, no edema, no cyanosis no clubbing ?SKIN:  No rashes no nodules ?NEURO:  Cranial nerves II through XII grossly intact, motor grossly intact throughout ?PSYCH:  Cognitively intact, oriented to person place and time ? ? ? ?EKG:  EKG is ordered today. ?The ekg ordered today  demonstrates sinus rhythm, right bundle branch block, left anterior fascicular block rate 75, axis within normal limits.  No acute ST-T wave changes. ? ? ?Recent Labs: ?06/24/2021: ALT 21; BUN 20; Creatinine, Ser 0.53; Hemoglobin 16.6; Platelets 245; Potassium 4.4; Sodium 141  ? ? ?Lipid Panel ?   ?Component Value Date/Time  ? CHOL 115 05/26/2021 0848  ? TRIG 160 (H) 05/26/2021 0848  ? HDL 53 05/26/2021 0848  ? CHOLHDL 2.2 05/26/2021 0848  ? VLDL 32 (H) 06/01/2015 0900  ? [place] 38 05/26/2021 0848  ? ?  ? ?Wt Readings from Last 3 Encounters:  ?07/01/21 202 lb (91.6 kg)  ?06/30/21 203  lb 0.7 oz (92.1 kg)  ?06/15/21 203 lb (92.1 kg)  ?  ? ? ?Other studies Reviewed: ?Additional studies/ records that were reviewed today include: Previous echocardiogram. ?Review of the above records demonstrates:  Please see elsewhere in the note.   ? ? ?ASSESSMENT AND PLAN: ? ?PREOP: The patient has no high risk cardiovascular findings.  She has no symptoms and despite her hip problem she is has a high functional level.  She is not going for high risk procedure from a cardiovascular standpoint.  Based on this and according to ACC/AHA guidelines the patient is at acceptable risk for the planned procedure.  No change in therapy. ? 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  We talked about this at length and she would let us know if she ever has any presyncope or syncope or other symptoms. ? ?CHRONIC DIASTOLIC HF: She has not had any problems with this.  She has had this history at the time she was getting chemotherapy.  I do think she needs careful volume management during this procedure but no further imaging is indicated.  No change in therapy. ? ? ?Current medicines are reviewed at length with the patient today.  The patient does not have concerns regarding medicines. ? ?The following changes have been made:  no change ? ?Labs/  tests ordered today include: None ? ?Orders Placed This Encounter  ?Procedures  ? EKG 12-Lead  ? ? ? ?Disposition:   FU with me as needed.    ? ? ?Signed, ?Minus Breeding, MD  ?07/01/2021 12:46 PM    ?[place]

## 2021-06-30 NOTE — Progress Notes (Addendum)
COVID Vaccine Completed: yes x2 ? ?Date of COVID positive in last 90 days: no ? ?PCP - Allyn Kenner, MD ?Cardiologist - Sterling Big, MD ? ?Clearance by Eloy End 06/01/21 on chart ? ?Chest x-ray - CT 06/24/21 Epic ?EKG - 07/01/21 Epic/chart ?Stress Test - n/a ?ECHO - 08/29/16 Epic ?Cardiac Cath - n/a ?Pacemaker/ICD device last checked: n/a ?Spinal Cord Stimulator: n/a ? ?Bowel Prep - no ? ?Sleep Study - n/a ?CPAP -  ? ?Fasting Blood Sugar - 114-190 ?Checks Blood Sugar  every other day ? ?Blood Thinner Instructions: n/a ?Aspirin Instructions: ?Last Dose: ? ?Activity level: Can go up a flight of stairs and perform activities of daily living without stopping and without symptoms of chest pain or shortness of breath. ?   ?Anesthesia review: HTN, CHF, DVT, HIV, DM, hodgkin lymphoma ? ?Patient denies shortness of breath, fever, cough and chest pain at PAT appointment ? ? ?Patient verbalized understanding of instructions that were given to them at the PAT appointment. Patient was also instructed that they will need to review over the PAT instructions again at home before surgery.  ?

## 2021-06-30 NOTE — Patient Instructions (Signed)
[place] at [facility] ?Discharge Instructions ? ?You were seen today by Tarri Abernethy PA-C for your history of lymphoma.  You did not show any signs of recurrent cancer on your recent CT scan or on physical exam today.  We will check labs again in 6 months, followed by an office visit with Dr. Delton Coombes. ? ?Your calcium levels are still slightly elevated, so we will check some additional labs before your next visit to try to find out why your calcium levels are elevated. ? ?Due to your elevated blood counts (erythrocytosis), I recommend you start taking aspirin 81 mg daily to help reduce the risk of heart attack and stroke. ? ? ? ?Thank you for choosing [place] at [facility] to provide your oncology and hematology care.  To afford each patient quality time with our provider, please arrive at least 15 minutes before your scheduled appointment time.  ? ?If you have a lab appointment with the [facility] please come in thru the Main Entrance and check in at the main information desk. ? ?You need to re-schedule your appointment should you arrive 10 or more minutes late.  We strive to give you quality time with our providers, and arriving late affects you and other patients whose appointments are after yours.  Also, if you no show three or more times for appointments you may be dismissed from the clinic at the providers discretion.     ?Again, thank you for choosing [facility].  Our hope is that these requests will decrease the amount of time that you wait before being seen by our physicians.       ?_____________________________________________________________ ? ?Should you have questions after your visit to [facility] please contact our office at [phone] and follow the prompts.  Our office hours are 8:00 a.m. and 4:30 p.m. Monday - Friday.  Please note that voicemails left after 4:00 p.m. may not be returned until the  following business day.  We are closed weekends and major holidays.  You do have access to a nurse 24-7, just call the main number to the clinic [phone] and do not press any options, hold on the line and a nurse will answer the phone.   ? ?For prescription refill requests, have your pharmacy contact our office and allow 72 hours.   ? ?Due to Covid, you will need to wear a mask upon entering the hospital. If you do not have a mask, a mask will be given to you at the Main Entrance upon arrival. For doctor visits, patients may have 1 support person age 10 or older with them. For treatment visits, patients can not have anyone with them due to social distancing guidelines and our immunocompromised population.  ? ? ? ?

## 2021-07-01 ENCOUNTER — Ambulatory Visit: Payer: BC Managed Care – PPO | Admitting: Cardiology

## 2021-07-01 ENCOUNTER — Encounter: Payer: Self-pay | Admitting: Cardiology

## 2021-07-01 ENCOUNTER — Encounter (HOSPITAL_COMMUNITY)
Admission: RE | Admit: 2021-07-01 | Discharge: 2021-07-01 | Disposition: A | Discharge status: Home / Self Care | Payer: BC Managed Care – PPO | Source: Ambulatory Visit | Attending: Orthopedic Surgery | Admitting: Orthopedic Surgery

## 2021-07-01 ENCOUNTER — Encounter (HOSPITAL_COMMUNITY): Payer: Self-pay

## 2021-07-01 VITALS — BP 113/68 | HR 69 | Temp 98.3°F | Resp 16 | Ht 61.0 in

## 2021-07-01 VITALS — BP 98/54 | HR 75 | Ht 61.0 in | Wt 202.0 lb

## 2021-07-01 DIAGNOSIS — Z01818 Encounter for other preprocedural examination: Secondary | ICD-10-CM | POA: Insufficient documentation

## 2021-07-01 DIAGNOSIS — M1611 Unilateral primary osteoarthritis, right hip: Secondary | ICD-10-CM | POA: Diagnosis not present

## 2021-07-01 DIAGNOSIS — Z8571 Personal history of Hodgkin lymphoma: Secondary | ICD-10-CM | POA: Diagnosis not present

## 2021-07-01 DIAGNOSIS — B20 Human immunodeficiency virus [HIV] disease: Secondary | ICD-10-CM | POA: Insufficient documentation

## 2021-07-01 DIAGNOSIS — Z86718 Personal history of other venous thrombosis and embolism: Secondary | ICD-10-CM | POA: Diagnosis not present

## 2021-07-01 DIAGNOSIS — E119 Type 2 diabetes mellitus without complications: Secondary | ICD-10-CM | POA: Diagnosis not present

## 2021-07-01 DIAGNOSIS — I110 Hypertensive heart disease with heart failure: Secondary | ICD-10-CM | POA: Diagnosis not present

## 2021-07-01 DIAGNOSIS — I509 Heart failure, unspecified: Secondary | ICD-10-CM | POA: Diagnosis not present

## 2021-07-01 DIAGNOSIS — I10 Essential (primary) hypertension: Secondary | ICD-10-CM

## 2021-07-01 HISTORY — DX: Essential (primary) hypertension: I10

## 2021-07-01 HISTORY — DX: Heart failure, unspecified: I50.9

## 2021-07-01 LAB — HEMOGLOBIN A1C
Hgb A1c MFr Bld: 5.4 % (ref 4.8–5.6)
Mean Plasma Glucose: 108.28 mg/dL

## 2021-07-01 LAB — GLUCOSE, CAPILLARY: Glucose-Capillary: 115 mg/dL — ABNORMAL HIGH (ref 70–99)

## 2021-07-01 LAB — SURGICAL PCR SCREEN
MRSA, PCR: NEGATIVE
Staphylococcus aureus: NEGATIVE

## 2021-07-01 NOTE — Patient Instructions (Signed)
?  Follow-Up: ?At [facility], you and your health needs are our priority.  As part of our continuing mission to provide you with exceptional heart care, we have created designated Provider Care Teams.  These Care Teams include your primary Cardiologist (physician) and Advanced Practice Providers (APPs -  Physician Assistants and Nurse Practitioners) who all work together to provide you with the care you need, when you need it. ? ?We recommend signing up for the patient portal called "MyChart".  Sign up information is provided on this After Visit Summary.  MyChart is used to connect with patients for Virtual Visits (Telemedicine).  Patients are able to view lab/test results, encounter notes, upcoming appointments, etc.  Non-urgent messages can be sent to your provider as well.   ?To learn more about what you can do with MyChart, go to [email or web address].   ? ?Your next appointment:  AS NEEDED1}  ? ? ? ? ?Important Information About Sugar ? ? ? ? ?  ?

## 2021-07-04 DIAGNOSIS — Z6838 Body mass index (BMI) 38.0-38.9, adult: Secondary | ICD-10-CM | POA: Diagnosis not present

## 2021-07-04 DIAGNOSIS — I10 Essential (primary) hypertension: Secondary | ICD-10-CM | POA: Diagnosis not present

## 2021-07-04 DIAGNOSIS — E119 Type 2 diabetes mellitus without complications: Secondary | ICD-10-CM | POA: Diagnosis not present

## 2021-07-04 DIAGNOSIS — E669 Obesity, unspecified: Secondary | ICD-10-CM | POA: Diagnosis not present

## 2021-07-04 NOTE — Progress Notes (Signed)
Anesthesia Chart Review ? ? Case: [id number] Date/Time: 07/07/21 0715  ? Procedure: TOTAL HIP ARTHROPLASTY ANTERIOR APPROACH (Right: Hip) - 150  ? Anesthesia type: Spinal  ? Pre-op diagnosis: Right hip osteoarthritis  ? Location: [facility] ROOM 08 / [facility]  ? Surgeons: Rod Can, MD  ? ?  ? ? ?DISCUSSION:66 y.o. never smoker with h/o HTN, DM II, HIV, [name], DVT, CHF, right hip OA scheduled for above procedure 07/07/2021 with Dr. Rod Can.  ? ?Pt last seen by cardiology 07/01/2021 for preoperative evaluation.  Per OV note, "PREOP: The patient has no high risk cardiovascular findings.  She has no symptoms and despite her hip problem she is has a high functional level.  She is not going for high risk procedure from a cardiovascular standpoint.  Based on this and according to ACC/AHA guidelines the patient is at acceptable risk for the planned procedure.  No change in therapy." ? ?Anticipate pt can proceed with planned procedure barring acute status change.   ?VS: BP 113/68   Pulse 69   Temp 36.8 ?C (Oral)   Resp 16   Ht '5\' 1"'$  (1.549 m)   SpO2 97%   BMI 38.36 kg/m?  ? ?PROVIDERS: ?Celene Squibb, MD is PCP  ? ?Minus Breeding, MD is Cardiologist  ? ?Michel Bickers, MD with Infectious Disease, last seen 06/15/2021, HIV under excellent control ? ?Derek Jack, MD is Oncologist, last seen 06/30/21, stable at this visit.  ?LABS: Labs reviewed: Acceptable for surgery. ?(all labs ordered are listed, but only abnormal results are displayed) ? ?Labs Reviewed  ?GLUCOSE, CAPILLARY - Abnormal; Notable for the following components:  ?    Result Value  ? Glucose-Capillary 115 (*)   ? All other components within normal limits  ?SURGICAL PCR SCREEN  ?HEMOGLOBIN A1C  ?TYPE AND SCREEN  ? ? ? ?IMAGES: ? ? ?EKG: ?07/01/2021 ?Rate 75 bpm  ?NSR ?RBBB ?LAFB ? ?CV: ?Echo 08/29/2016 ?Study Conclusions  ? ?- Left ventricle: The cavity size was normal. Wall thickness was  ?  normal. Systolic function was normal. The estimated  ejection  ?  fraction was in the range of 55% to 60%. GLPSS is normal at -18%.  ?  Wall motion was normal; there were no regional wall motion  ?  abnormalities. Doppler parameters are consistent with abnormal  ?  left ventricular relaxation (grade 1 diastolic dysfunction). The  ?  E/e&' ratio is between 8-15, suggesting indeterminate LV filling  ?  pressure.  ?- Left atrium: The atrium was normal in size.  ?- Tricuspid valve: There was trivial regurgitation.  ?- Pulmonary arteries: PA peak pressure: 18 mm Hg (S).  ?- Inferior vena cava: The vessel was normal in size. The  ?  respirophasic diameter changes were in the normal range (>= 50%),  ?  consistent with normal central venous pressure.  ?Past Medical History:  ?Diagnosis Date  ? Acute deep vein thrombosis (DVT) of popliteal vein of left lower extremity ([place]) 02/09/2016  ? Arthritis   ? osteoarthritis  ? CHF (congestive heart failure) ([place])   ? EF NL  ? Diabetes mellitus without complication ([place])   ? History of radiation therapy 04/25/2016 - 05/23/2016  ? Left Axilla treated to 36 Gy in 20 fractions  ? 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 Human immunodeficiency virus (HIV) disease ([place]) 02/23/2006  ? Qualifier: Diagnosis of  By: Megan Salon MD, John    ? Hypertension   ? ? ?Past Surgical History:  ?  Procedure Laterality Date  ? AXILLARY LYMPH NODE BIOPSY Left 07/23/2015  ? Procedure: EXCISIONAL BIOPSY LEFT AXILLARY LYMPH NODE;  Surgeon: [name], MD;  Location: [facility];  Service: General;  Laterality: Left;  ? PORT-A-CATH REMOVAL N/A 01/09/2018  ? Procedure: MINOR REMOVAL PORT-A-CATH;  Surgeon: Aviva Signs, MD;  Location: [facility];  Service: General;  Laterality: N/A;  ? PORTACATH PLACEMENT Right 09/08/2015  ? Procedure: INSERTION OF CENTRAL VENOUS CATHETER WITH PORT FOR CHEMOTHERAPY;  Surgeon: [name], MD;  Location: [facility];  Service: General;  Laterality: Right;  ? SHOULDER SURGERY Right   ? removal of bone spur  ? WISDOM TOOTH  EXTRACTION    ? ? ?MEDICATIONS: ? acetaminophen (TYLENOL) 650 MG CR tablet  ? buPROPion (WELLBUTRIN XL) 150 MG 24 hr tablet  ? cholecalciferol (VITAMIN D) 1000 units tablet  ? dolutegravir (TIVICAY) 50 MG tablet  ? emtricitabine-rilpivir-tenofovir AF (ODEFSEY) 200-25-25 MG TABS tablet  ? fenofibrate 160 MG tablet  ? furosemide (LASIX) 20 MG tablet  ? JARDIANCE 25 MG TABS tablet  ? Metamucil Fiber CHEW  ? metFORMIN (GLUCOPHAGE) 500 MG tablet  ? metoprolol tartrate (LOPRESSOR) 25 MG tablet  ? Multiple Vitamin (MULTIVITAMIN WITH MINERALS) TABS tablet  ? OZEMPIC, 1 MG/DOSE, 4 MG/3ML SOPN  ? REPATHA 140 MG/ML SOSY  ? spironolactone (ALDACTONE) 25 MG tablet  ? vitamin E 400 UNIT capsule  ? ?No current facility-administered medications for this encounter.  ? ? ?Holly Felix Ward, PA-C ?[facility] ?[phone] ? ? ? ? ? ? ?

## 2021-07-06 LAB — EXTERNAL GENERIC LAB PROCEDURE: COLOGUARD: NEGATIVE

## 2021-07-06 LAB — COLOGUARD: COLOGUARD: NEGATIVE

## 2021-07-06 NOTE — Anesthesia Preprocedure Evaluation (Addendum)
Anesthesia Evaluation  Patient identified by MRN, date of birth, ID band Patient awake    Reviewed: Allergy & Precautions, NPO status , Patient's Chart, lab work & pertinent test results  Airway Mallampati: II  TM Distance: >3 FB Neck ROM: Full    Dental no notable dental hx.    Pulmonary neg pulmonary ROS,    Pulmonary exam normal breath sounds clear to auscultation       Cardiovascular hypertension, Pt. on medications and Pt. on home beta blockers negative cardio ROS Normal cardiovascular exam Rhythm:Regular Rate:Normal     Neuro/Psych PSYCHIATRIC DISORDERS Depression negative neurological ROS     GI/Hepatic negative GI ROS, Neg liver ROS,   Endo/Other  negative endocrine ROSdiabetes, Type 2, Oral Hypoglycemic Agents  Renal/GU negative Renal ROS  negative genitourinary   Musculoskeletal  (+) Arthritis , Osteoarthritis,    Abdominal   Peds negative pediatric ROS (+)  Hematology negative hematology ROS (+)   Anesthesia Other Findings HIV  Reproductive/Obstetrics negative OB ROS                            Anesthesia Physical Anesthesia Plan  ASA: 3  Anesthesia Plan: Spinal and MAC   Post-op Pain Management: Tylenol PO (pre-op)*   Induction: Intravenous  PONV Risk Score and Plan: 2 and Treatment may vary due to age or medical condition, Propofol infusion, Ondansetron and TIVA  Airway Management Planned: Natural Airway and Simple Face Mask  Additional Equipment:   Intra-op Plan:   Post-operative Plan:   Informed Consent: I have reviewed the patients History and Physical, chart, labs and discussed the procedure including the risks, benefits and alternatives for the proposed anesthesia with the patient or authorized representative who has indicated his/her understanding and acceptance.       Plan Discussed with: CRNA, Anesthesiologist and Surgeon  Anesthesia Plan Comments:         Anesthesia Quick Evaluation

## 2021-07-07 ENCOUNTER — Ambulatory Visit (HOSPITAL_COMMUNITY): Payer: BC Managed Care – PPO | Admitting: Physician Assistant

## 2021-07-07 ENCOUNTER — Ambulatory Visit (HOSPITAL_COMMUNITY): Payer: BC Managed Care – PPO

## 2021-07-07 ENCOUNTER — Ambulatory Visit (HOSPITAL_COMMUNITY): Payer: BC Managed Care – PPO | Admitting: Certified Registered Nurse Anesthetist

## 2021-07-07 ENCOUNTER — Encounter (HOSPITAL_COMMUNITY): Payer: Self-pay | Admitting: Orthopedic Surgery

## 2021-07-07 ENCOUNTER — Encounter (HOSPITAL_COMMUNITY)
Admission: RE | Disposition: A | Discharge status: Home / Self Care | Payer: Self-pay | Source: Ambulatory Visit | Attending: Orthopedic Surgery

## 2021-07-07 ENCOUNTER — Ambulatory Visit (HOSPITAL_COMMUNITY)
Admission: RE | Admit: 2021-07-07 | Discharge: 2021-07-07 | Disposition: A | Discharge status: Home / Self Care | Payer: BC Managed Care – PPO | Source: Ambulatory Visit | Attending: Orthopedic Surgery | Admitting: Orthopedic Surgery

## 2021-07-07 ENCOUNTER — Other Ambulatory Visit: Payer: Self-pay

## 2021-07-07 DIAGNOSIS — E669 Obesity, unspecified: Secondary | ICD-10-CM | POA: Insufficient documentation

## 2021-07-07 DIAGNOSIS — Z471 Aftercare following joint replacement surgery: Secondary | ICD-10-CM | POA: Diagnosis not present

## 2021-07-07 DIAGNOSIS — E1165 Type 2 diabetes mellitus with hyperglycemia: Secondary | ICD-10-CM

## 2021-07-07 DIAGNOSIS — Z6839 Body mass index (BMI) 39.0-39.9, adult: Secondary | ICD-10-CM | POA: Insufficient documentation

## 2021-07-07 DIAGNOSIS — Z7984 Long term (current) use of oral hypoglycemic drugs: Secondary | ICD-10-CM | POA: Insufficient documentation

## 2021-07-07 DIAGNOSIS — I509 Heart failure, unspecified: Secondary | ICD-10-CM | POA: Insufficient documentation

## 2021-07-07 DIAGNOSIS — M1611 Unilateral primary osteoarthritis, right hip: Secondary | ICD-10-CM | POA: Diagnosis not present

## 2021-07-07 DIAGNOSIS — Z96641 Presence of right artificial hip joint: Secondary | ICD-10-CM | POA: Diagnosis not present

## 2021-07-07 DIAGNOSIS — Z21 Asymptomatic human immunodeficiency virus [HIV] infection status: Secondary | ICD-10-CM | POA: Diagnosis not present

## 2021-07-07 DIAGNOSIS — I110 Hypertensive heart disease with heart failure: Secondary | ICD-10-CM | POA: Diagnosis not present

## 2021-07-07 DIAGNOSIS — E119 Type 2 diabetes mellitus without complications: Secondary | ICD-10-CM | POA: Insufficient documentation

## 2021-07-07 DIAGNOSIS — I10 Essential (primary) hypertension: Secondary | ICD-10-CM | POA: Diagnosis not present

## 2021-07-07 HISTORY — PX: TOTAL HIP ARTHROPLASTY: SHX124

## 2021-07-07 LAB — TYPE AND SCREEN
ABO/RH(D): A POS
Antibody Screen: NEGATIVE

## 2021-07-07 LAB — ABO/RH: ABO/RH(D): A POS

## 2021-07-07 LAB — GLUCOSE, CAPILLARY
Glucose-Capillary: 124 mg/dL — ABNORMAL HIGH (ref 70–99)
Glucose-Capillary: 125 mg/dL — ABNORMAL HIGH (ref 70–99)
Glucose-Capillary: 148 mg/dL — ABNORMAL HIGH (ref 70–99)

## 2021-07-07 LAB — HEMOGLOBIN A1C
Hgb A1c MFr Bld: 5.5 % (ref 4.8–5.6)
Mean Plasma Glucose: 111.15 mg/dL

## 2021-07-07 SURGERY — ARTHROPLASTY, HIP, TOTAL, ANTERIOR APPROACH
Anesthesia: Monitor Anesthesia Care | Site: Hip | Laterality: Right

## 2021-07-07 MED ORDER — PROPOFOL 500 MG/50ML IV EMUL
INTRAVENOUS | Status: DC | PRN
Start: 2021-07-07 — End: 2021-07-07
  Administered 2021-07-07: 75 ug/kg/min via INTRAVENOUS

## 2021-07-07 MED ORDER — BUPIVACAINE IN DEXTROSE 0.75-8.25 % IT SOLN
INTRATHECAL | Status: DC | PRN
  Administered 2021-07-07: 2 mL via INTRATHECAL

## 2021-07-07 MED ORDER — 0.9 % SODIUM CHLORIDE (POUR BTL) OPTIME
TOPICAL | Status: DC | PRN
  Administered 2021-07-07: 1000 mL

## 2021-07-07 MED ORDER — BUPIVACAINE-EPINEPHRINE (PF) 0.25% -1:200000 IJ SOLN
INTRAMUSCULAR | Status: AC
  Filled 2021-07-07: qty 30

## 2021-07-07 MED ORDER — PROPOFOL 1000 MG/100ML IV EMUL
INTRAVENOUS | Status: AC
  Filled 2021-07-07: qty 100

## 2021-07-07 MED ORDER — LACTATED RINGERS IV BOLUS
250.0000 mL | Freq: Once | INTRAVENOUS | Status: AC
  Administered 2021-07-07: 250 mL via INTRAVENOUS

## 2021-07-07 MED ORDER — KETOROLAC TROMETHAMINE 15 MG/ML IJ SOLN
INTRAMUSCULAR | Status: AC
  Administered 2021-07-07: 7.5 mg via INTRAVENOUS
  Filled 2021-07-07: qty 1

## 2021-07-07 MED ORDER — APIXABAN 2.5 MG PO TABS: 2.5000 mg | ORAL_TABLET | Freq: Two times a day (BID) | ORAL | 0 refills | Status: DC

## 2021-07-07 MED ORDER — PHENYLEPHRINE 80 MCG/ML (10ML) SYRINGE FOR IV PUSH (FOR BLOOD PRESSURE SUPPORT)
PREFILLED_SYRINGE | INTRAVENOUS | Status: DC | PRN
  Administered 2021-07-07 (×4): 80 ug via INTRAVENOUS

## 2021-07-07 MED ORDER — KETOROLAC TROMETHAMINE 15 MG/ML IJ SOLN: 7.5000 mg | Freq: Four times a day (QID) | INTRAMUSCULAR | Status: DC

## 2021-07-07 MED ORDER — ACETAMINOPHEN 500 MG PO TABS
1000.0000 mg | ORAL_TABLET | Freq: Once | ORAL | Status: AC
  Administered 2021-07-07: 1000 mg via ORAL
  Filled 2021-07-07: qty 2

## 2021-07-07 MED ORDER — ACETAMINOPHEN 325 MG PO TABS: 325.0000 mg | ORAL_TABLET | Freq: Four times a day (QID) | ORAL | Status: DC | PRN

## 2021-07-07 MED ORDER — ONDANSETRON HCL 4 MG/2ML IJ SOLN: 4.0000 mg | Freq: Four times a day (QID) | INTRAMUSCULAR | Status: DC | PRN

## 2021-07-07 MED ORDER — BUPIVACAINE-EPINEPHRINE 0.25% -1:200000 IJ SOLN
INTRAMUSCULAR | Status: DC | PRN
  Administered 2021-07-07: 30 mL

## 2021-07-07 MED ORDER — METOCLOPRAMIDE HCL 5 MG PO TABS: 5.0000 mg | ORAL_TABLET | Freq: Three times a day (TID) | ORAL | Status: DC | PRN

## 2021-07-07 MED ORDER — CHLORHEXIDINE GLUCONATE 0.12 % MT SOLN
15.0000 mL | Freq: Once | OROMUCOSAL | Status: AC
  Administered 2021-07-07: 15 mL via OROMUCOSAL

## 2021-07-07 MED ORDER — PHENYLEPHRINE HCL-NACL 20-0.9 MG/250ML-% IV SOLN
INTRAVENOUS | Status: AC
  Filled 2021-07-07: qty 500

## 2021-07-07 MED ORDER — DOCUSATE SODIUM 100 MG PO CAPS: 100.0000 mg | ORAL_CAPSULE | Freq: Two times a day (BID) | ORAL | 1 refills | Status: AC

## 2021-07-07 MED ORDER — METHOCARBAMOL 500 MG PO TABS: 500.0000 mg | ORAL_TABLET | Freq: Four times a day (QID) | ORAL | Status: DC | PRN

## 2021-07-07 MED ORDER — HYDROCODONE-ACETAMINOPHEN 7.5-325 MG PO TABS: 1.0000 | ORAL_TABLET | ORAL | Status: DC | PRN

## 2021-07-07 MED ORDER — FENTANYL CITRATE PF 50 MCG/ML IJ SOSY: 25.0000 ug | PREFILLED_SYRINGE | INTRAMUSCULAR | Status: DC | PRN

## 2021-07-07 MED ORDER — ORAL CARE MOUTH RINSE: 15.0000 mL | Freq: Once | OROMUCOSAL | Status: AC

## 2021-07-07 MED ORDER — PHENYLEPHRINE HCL-NACL 20-0.9 MG/250ML-% IV SOLN
INTRAVENOUS | Status: DC | PRN
  Administered 2021-07-07: 25 ug/min via INTRAVENOUS

## 2021-07-07 MED ORDER — POVIDONE-IODINE 10 % EX SWAB: 2.0000 "application " | Freq: Once | CUTANEOUS | Status: DC

## 2021-07-07 MED ORDER — SODIUM CHLORIDE (PF) 0.9 % IJ SOLN
INTRAMUSCULAR | Status: DC | PRN
  Administered 2021-07-07: 30 mL

## 2021-07-07 MED ORDER — SENNA 8.6 MG PO TABS: 2.0000 | ORAL_TABLET | Freq: Every day | ORAL | 1 refills | Status: AC

## 2021-07-07 MED ORDER — AMISULPRIDE (ANTIEMETIC) 5 MG/2ML IV SOLN: 10.0000 mg | Freq: Once | INTRAVENOUS | Status: DC | PRN

## 2021-07-07 MED ORDER — WATER FOR IRRIGATION, STERILE IR SOLN
Status: DC | PRN
  Administered 2021-07-07: 2000 mL

## 2021-07-07 MED ORDER — KETOROLAC TROMETHAMINE 30 MG/ML IJ SOLN
INTRAMUSCULAR | Status: AC
  Filled 2021-07-07: qty 1

## 2021-07-07 MED ORDER — PROPOFOL 500 MG/50ML IV EMUL
INTRAVENOUS | Status: AC
  Filled 2021-07-07: qty 50

## 2021-07-07 MED ORDER — CEFAZOLIN SODIUM-DEXTROSE 2-4 GM/100ML-% IV SOLN
2.0000 g | INTRAVENOUS | Status: AC
  Administered 2021-07-07: 2 g via INTRAVENOUS
  Filled 2021-07-07: qty 100

## 2021-07-07 MED ORDER — METHOCARBAMOL 500 MG IVPB - SIMPLE MED: 500.0000 mg | Freq: Four times a day (QID) | INTRAVENOUS | Status: DC | PRN

## 2021-07-07 MED ORDER — MIDAZOLAM HCL 5 MG/5ML IJ SOLN
INTRAMUSCULAR | Status: DC | PRN
  Administered 2021-07-07: 2 mg via INTRAVENOUS

## 2021-07-07 MED ORDER — SODIUM CHLORIDE 0.9 % IR SOLN
Status: DC | PRN
  Administered 2021-07-07 (×2): 1000 mL

## 2021-07-07 MED ORDER — OXYCODONE HCL 5 MG PO TABS: 5.0000 mg | ORAL_TABLET | Freq: Once | ORAL | Status: DC | PRN

## 2021-07-07 MED ORDER — LACTATED RINGERS IV SOLN: INTRAVENOUS | Status: DC

## 2021-07-07 MED ORDER — POLYETHYLENE GLYCOL 3350 17 G PO PACK: 17.0000 g | PACK | Freq: Every day | ORAL | 0 refills | Status: DC | PRN

## 2021-07-07 MED ORDER — FENTANYL CITRATE (PF) 100 MCG/2ML IJ SOLN
INTRAMUSCULAR | Status: AC
  Filled 2021-07-07: qty 2

## 2021-07-07 MED ORDER — CEFAZOLIN SODIUM-DEXTROSE 2-4 GM/100ML-% IV SOLN: 2.0000 g | Freq: Four times a day (QID) | INTRAVENOUS | Status: DC

## 2021-07-07 MED ORDER — ONDANSETRON HCL 4 MG PO TABS: 4.0000 mg | ORAL_TABLET | Freq: Four times a day (QID) | ORAL | Status: DC | PRN

## 2021-07-07 MED ORDER — INSULIN ASPART 100 UNIT/ML IJ SOLN: 0.0000 [IU] | Freq: Every day | INTRAMUSCULAR | Status: DC

## 2021-07-07 MED ORDER — ISOPROPYL ALCOHOL 70 % SOLN
Status: DC | PRN
  Administered 2021-07-07: 1 via TOPICAL

## 2021-07-07 MED ORDER — ONDANSETRON HCL 4 MG/2ML IJ SOLN: 4.0000 mg | Freq: Once | INTRAMUSCULAR | Status: DC | PRN

## 2021-07-07 MED ORDER — MORPHINE SULFATE (PF) 2 MG/ML IV SOLN: 0.5000 mg | INTRAVENOUS | Status: DC | PRN

## 2021-07-07 MED ORDER — INSULIN ASPART 100 UNIT/ML IJ SOLN: 0.0000 [IU] | Freq: Three times a day (TID) | INTRAMUSCULAR | Status: DC

## 2021-07-07 MED ORDER — MIDAZOLAM HCL 2 MG/2ML IJ SOLN
INTRAMUSCULAR | Status: AC
  Filled 2021-07-07: qty 2

## 2021-07-07 MED ORDER — SODIUM CHLORIDE (PF) 0.9 % IJ SOLN
INTRAMUSCULAR | Status: AC
  Filled 2021-07-07: qty 30

## 2021-07-07 MED ORDER — POVIDONE-IODINE 10 % EX SWAB
2.0000 | Freq: Once | CUTANEOUS | Status: AC
Start: 2021-07-07 — End: 2021-07-07
  Administered 2021-07-07: 2 via TOPICAL

## 2021-07-07 MED ORDER — ONDANSETRON HCL 4 MG/2ML IJ SOLN
INTRAMUSCULAR | Status: AC
  Filled 2021-07-07: qty 2

## 2021-07-07 MED ORDER — HYDROCODONE-ACETAMINOPHEN 10-325 MG PO TABS: 0.5000 | ORAL_TABLET | ORAL | 0 refills | Status: AC | PRN

## 2021-07-07 MED ORDER — HYDROCODONE-ACETAMINOPHEN 5-325 MG PO TABS: 1.0000 | ORAL_TABLET | ORAL | Status: DC | PRN

## 2021-07-07 MED ORDER — KETOROLAC TROMETHAMINE 30 MG/ML IJ SOLN
INTRAMUSCULAR | Status: DC | PRN
  Administered 2021-07-07: 30 mg

## 2021-07-07 MED ORDER — DEXAMETHASONE SODIUM PHOSPHATE 10 MG/ML IJ SOLN
INTRAMUSCULAR | Status: AC
  Filled 2021-07-07: qty 1

## 2021-07-07 MED ORDER — METOCLOPRAMIDE HCL 5 MG/ML IJ SOLN: 5.0000 mg | Freq: Three times a day (TID) | INTRAMUSCULAR | Status: DC | PRN

## 2021-07-07 MED ORDER — OXYCODONE HCL 5 MG/5ML PO SOLN: 5.0000 mg | Freq: Once | ORAL | Status: DC | PRN

## 2021-07-07 MED ORDER — TRANEXAMIC ACID-NACL 1000-0.7 MG/100ML-% IV SOLN
1000.0000 mg | INTRAVENOUS | Status: AC
  Administered 2021-07-07: 1000 mg via INTRAVENOUS
  Filled 2021-07-07: qty 100

## 2021-07-07 MED ORDER — ONDANSETRON HCL 4 MG/2ML IJ SOLN
INTRAMUSCULAR | Status: DC | PRN
  Administered 2021-07-07: 4 mg via INTRAVENOUS

## 2021-07-07 MED ORDER — LACTATED RINGERS IV BOLUS
500.0000 mL | Freq: Once | INTRAVENOUS | Status: AC
  Administered 2021-07-07: 500 mL via INTRAVENOUS

## 2021-07-07 MED ORDER — DEXAMETHASONE SODIUM PHOSPHATE 10 MG/ML IJ SOLN
INTRAMUSCULAR | Status: DC | PRN
  Administered 2021-07-07: 5 mg via INTRAVENOUS

## 2021-07-07 MED ORDER — ONDANSETRON HCL 4 MG PO TABS: 4.0000 mg | ORAL_TABLET | Freq: Three times a day (TID) | ORAL | 0 refills | Status: DC | PRN

## 2021-07-07 SURGICAL SUPPLY — 61 items
ACE SHELL 3H 52 E HIP (Shell) ×2 IMPLANT
BAG COUNTER SPONGE SURGICOUNT (BAG) ×1 IMPLANT
BAG DECANTER FOR FLEXI CONT (MISCELLANEOUS) IMPLANT
BAG ZIPLOCK 12X15 (MISCELLANEOUS) IMPLANT
CHLORAPREP W/TINT 26 (MISCELLANEOUS) ×2 IMPLANT
COVER PERINEAL POST (MISCELLANEOUS) ×2 IMPLANT
COVER SURGICAL LIGHT HANDLE (MISCELLANEOUS) ×2 IMPLANT
DERMABOND ADVANCED (GAUZE/BANDAGES/DRESSINGS) ×1
DERMABOND ADVANCED .7 DNX12 (GAUZE/BANDAGES/DRESSINGS) ×2 IMPLANT
DRAPE IMP U-DRAPE 54X76 (DRAPES) ×2 IMPLANT
DRAPE SHEET LG 3/4 BI-LAMINATE (DRAPES) ×6 IMPLANT
DRAPE STERI IOBAN 125X83 (DRAPES) ×2 IMPLANT
DRAPE U-SHAPE 47X51 STRL (DRAPES) ×4 IMPLANT
DRSG AQUACEL AG ADV 3.5X10 (GAUZE/BANDAGES/DRESSINGS) ×2 IMPLANT
ELECT REM PT RETURN 15FT ADLT (MISCELLANEOUS) ×2 IMPLANT
GAUZE SPONGE 4X4 12PLY STRL (GAUZE/BANDAGES/DRESSINGS) ×2 IMPLANT
GLOVE BIO SURGEON STRL SZ8.5 (GLOVE) ×4 IMPLANT
GLOVE BIOGEL M 7.0 STRL (GLOVE) ×2 IMPLANT
GLOVE BIOGEL PI IND STRL 7.5 (GLOVE) ×1 IMPLANT
GLOVE BIOGEL PI IND STRL 8 (GLOVE) ×1 IMPLANT
GLOVE BIOGEL PI IND STRL 8.5 (GLOVE) ×1 IMPLANT
GLOVE BIOGEL PI INDICATOR 7.5 (GLOVE) ×1
GLOVE BIOGEL PI INDICATOR 8 (GLOVE) ×1
GLOVE BIOGEL PI INDICATOR 8.5 (GLOVE) ×1
GLOVE SURG LX 7.5 STRW (GLOVE) ×2
GLOVE SURG LX STRL 7.5 STRW (GLOVE) ×2 IMPLANT
GOWN SPEC L3 XXLG W/TWL (GOWN DISPOSABLE) ×4 IMPLANT
HANDPIECE INTERPULSE COAX TIP (DISPOSABLE) ×2
HEAD CERAMIC BIOLOX 36 T1 STD (Head) ×1 IMPLANT
HOLDER FOLEY CATH W/STRAP (MISCELLANEOUS) ×2 IMPLANT
HOOD PEEL AWAY FLYTE STAYCOOL (MISCELLANEOUS) ×6 IMPLANT
KIT TURNOVER KIT A (KITS) ×1 IMPLANT
LINER ACE G7 HIGH 36 SZ E (Liner) ×1 IMPLANT
MANIFOLD NEPTUNE II (INSTRUMENTS) ×2 IMPLANT
MARKER SKIN DUAL TIP RULER LAB (MISCELLANEOUS) ×2 IMPLANT
NDL SAFETY ECLIPSE 18X1.5 (NEEDLE) ×1 IMPLANT
NDL SPNL 18GX3.5 QUINCKE PK (NEEDLE) ×1 IMPLANT
NEEDLE HYPO 18GX1.5 SHARP (NEEDLE) ×2
NEEDLE SPNL 18GX3.5 QUINCKE PK (NEEDLE) ×2 IMPLANT
PACK ANTERIOR HIP CUSTOM (KITS) ×2 IMPLANT
PENCIL SMOKE EVACUATOR (MISCELLANEOUS) IMPLANT
SAW OSC TIP CART 19.5X105X1.3 (SAW) ×2 IMPLANT
SEALER BIPOLAR AQUA 6.0 (INSTRUMENTS) ×2 IMPLANT
SET HNDPC FAN SPRY TIP SCT (DISPOSABLE) ×1 IMPLANT
SHELL ACETAB 3H 52 E HIP (Shell) IMPLANT
SOLUTION PRONTOSAN WOUND 350ML (IRRIGATION / IRRIGATOR) ×2 IMPLANT
SPIKE FLUID TRANSFER (MISCELLANEOUS) ×2 IMPLANT
STAPLER INSORB 30 2030 C-SECTI (MISCELLANEOUS) IMPLANT
STAPLER VISISTAT 35W (STAPLE) ×1 IMPLANT
STEM FEM CEMTL 133D 14X113X196 (Stem) ×1 IMPLANT
SUT MNCRL AB 3-0 PS2 18 (SUTURE) ×2 IMPLANT
SUT MON AB 2-0 CT1 36 (SUTURE) ×3 IMPLANT
SUT STRATAFIX PDO 1 14 VIOLET (SUTURE) ×2
SUT STRATFX PDO 1 14 VIOLET (SUTURE) ×1
SUT VIC AB 2-0 CT1 27 (SUTURE)
SUT VIC AB 2-0 CT1 TAPERPNT 27 (SUTURE) IMPLANT
SUTURE STRATFX PDO 1 14 VIOLET (SUTURE) ×1 IMPLANT
SYR 3ML LL SCALE MARK (SYRINGE) ×2 IMPLANT
TRAY FOLEY MTR SLVR 16FR STAT (SET/KITS/TRAYS/PACK) IMPLANT
TUBE SUCTION HIGH CAP CLEAR NV (SUCTIONS) ×2 IMPLANT
WATER STERILE IRR 1000ML POUR (IV SOLUTION) ×2 IMPLANT

## 2021-07-07 NOTE — Anesthesia Postprocedure Evaluation (Signed)
Anesthesia Post Note  Patient: Holly Stephens  Procedure(s) Performed: TOTAL HIP ARTHROPLASTY ANTERIOR APPROACH (Right: Hip)     Patient location during evaluation: PACU Anesthesia Type: MAC Level of consciousness: oriented and awake and alert Pain management: pain level controlled Vital Signs Assessment: post-procedure vital signs reviewed and stable Respiratory status: spontaneous breathing and respiratory function stable Cardiovascular status: blood pressure returned to baseline and stable Postop Assessment: no headache, no backache, no apparent nausea or vomiting and patient able to bend at knees Anesthetic complications: no   No notable events documented.  Last Vitals:  Vitals:   07/07/21 1115 07/07/21 1130  BP: 117/62 127/69  Pulse: (!) 58 (!) 57  Resp: 19 12  Temp:    SpO2: 100% 100%    Last Pain:  Vitals:   07/07/21 1130  TempSrc:   PainSc: 0-No pain    LLE Motor Response: Purposeful movement (07/07/21 1130) LLE Sensation: Increased (07/07/21 1130) RLE Motor Response: Purposeful movement (07/07/21 1130) RLE Sensation: Increased (07/07/21 1130) L Sensory Level: S1-Sole of foot, small toes (07/07/21 1130) R Sensory Level: S1-Sole of foot, small toes (07/07/21 1130)  Merlinda Frederick

## 2021-07-07 NOTE — Anesthesia Procedure Notes (Signed)
Spinal  Patient location during procedure: OR Start time: 07/07/2021 7:40 AM End time: 07/07/2021 7:45 AM Reason for block: surgical anesthesia Staffing Performed: anesthesiologist  Anesthesiologist: Merlinda Frederick, MD Preanesthetic Checklist Completed: patient identified, IV checked, risks and benefits discussed, surgical consent, monitors and equipment checked, pre-op evaluation and timeout performed Spinal Block Patient position: sitting Prep: DuraPrep Patient monitoring: cardiac monitor, continuous pulse ox and blood pressure Approach: midline Location: L3-4 Injection technique: single-shot Needle Needle type: Pencan  Needle gauge: 24 G Needle length: 9 cm Assessment Events: CSF return and second provider Additional Notes Functioning IV was confirmed and monitors were applied. Sterile prep and drape, including hand hygiene and sterile gloves were used. The patient was positioned and the spine was prepped. The skin was anesthetized with lidocaine.  Free flow of clear CSF was obtained prior to injecting local anesthetic into the CSF.  The spinal needle aspirated freely following injection.  The needle was carefully withdrawn.  The patient tolerated the procedure well.

## 2021-07-07 NOTE — Transfer of Care (Signed)
Immediate Anesthesia Transfer of Care Note  Patient: Holly Stephens  Procedure(s) Performed: TOTAL HIP ARTHROPLASTY ANTERIOR APPROACH (Right: Hip)  Patient Location: PACU  Anesthesia Type:MAC and Spinal  Level of Consciousness: drowsy  Airway & Oxygen Therapy: Patient Spontanous Breathing and Patient connected to face mask oxygen  Post-op Assessment: Report given to RN and Post -op Vital signs reviewed and stable  Post vital signs: Reviewed and stable  Last Vitals:  Vitals Value Taken Time  BP 105/63 07/07/21 1005  Temp    Pulse 69 07/07/21 1009  Resp 17 07/07/21 1009  SpO2 100 % 07/07/21 1009  Vitals shown include unvalidated device data.  Last Pain:  Vitals:   07/07/21 0615  TempSrc:   PainSc: 2       Patients Stated Pain Goal: 2 (28/97/91 5041)  Complications: No notable events documented.

## 2021-07-07 NOTE — Evaluation (Signed)
Physical Therapy Evaluation Patient Details Name: Holly Stephens MRN: [id number] DOB: 08-31-1955 Today's Date: 07/07/2021  History of Present Illness  S/P  R DATHA on 07/07/21  Clinical Impression  The patient ambulating safely using RW   . Gait is steady. Patient practiced steps with rails. Patient plans DC today. Has met goa;ls for safe DC home.      Recommendations for follow up therapy are one component of a multi-disciplinary discharge planning process, led by the attending physician.  Recommendations may be updated based on patient status, additional functional criteria and insurance authorization.  Follow Up Recommendations Follow physician's recommendations for discharge plan and follow up therapies    Assistance Recommended at Discharge Set up Supervision/Assistance  Patient can return home with the following  Help with stairs or ramp for entrance;Assistance with cooking/housework;Assist for transportation    Equipment Recommendations Rolling walker (2 wheels)  Recommendations for Other Services       Functional Status Assessment Patient has had a recent decline in their functional status and demonstrates the ability to make significant improvements in function in a reasonable and predictable amount of time.     Precautions / Restrictions Precautions Precautions: Fall      Mobility  Bed Mobility Overal bed mobility: Modified Independent                  Transfers Overall transfer level: Needs assistance   Transfers: Sit to/from Stand Sit to Stand: Supervision                Ambulation/Gait Ambulation/Gait assistance: Supervision Gait Distance (Feet): 200 Feet Assistive device: Rolling walker (2 wheels) Gait Pattern/deviations: Step-through pattern       General Gait Details: gait steady and smoothe  Stairs            Wheelchair Mobility    Modified Rankin (Stroke Patients Only)       Balance Overall balance assessment: No  apparent balance deficits (not formally assessed)                                           Pertinent Vitals/Pain Pain Assessment Pain Assessment: 0-10 Pain Score: 4  Pain Location: right hip Pain Descriptors / Indicators: Sore Pain Intervention(s): Monitored during session    Home Living Family/patient expects to be discharged to:: Private residence Living Arrangements: Alone Available Help at Discharge: Family;Available 24 hours/day Type of Home: House Home Access: Stairs to enter Entrance Stairs-Rails: Can reach both Entrance Stairs-Number of Steps: 2   Home Layout: One level Home Equipment: Grab bars - toilet;Cane - quad      Prior Function Prior Level of Function : Independent/Modified Independent                     Hand Dominance        Extremity/Trunk Assessment   Upper Extremity Assessment Upper Extremity Assessment: Overall WFL for tasks assessed    Lower Extremity Assessment Lower Extremity Assessment: Overall WFL for tasks assessed    Cervical / Trunk Assessment Cervical / Trunk Assessment: Normal  Communication   Communication: No difficulties  Cognition Arousal/Alertness: Awake/alert Behavior During Therapy: WFL for tasks assessed/performed Overall Cognitive Status: Within Functional Limits for tasks assessed  General Comments      Exercises Total Joint Exercises Ankle Circles/Pumps: AROM Heel Slides: AROM, Right, 5 reps Hip ABduction/ADduction: AROM Long Arc Quad: AROM   Assessment/Plan    PT Assessment Patient does not need any further PT services  PT Problem List Decreased strength;Decreased activity tolerance       PT Treatment Interventions      PT Goals (Current goals can be found in the Care Plan section)  Acute Rehab PT Goals Patient Stated Goal: go home PT Goal Formulation: All assessment and education complete, DC therapy    Frequency        Co-evaluation               AM-PAC PT "6 Clicks" Mobility  Outcome Measure Help needed turning from your back to your side while in a flat bed without using bedrails?: None Help needed moving from lying on your back to sitting on the side of a flat bed without using bedrails?: None Help needed moving to and from a bed to a chair (including a wheelchair)?: A Little Help needed standing up from a chair using your arms (e.g., wheelchair or bedside chair)?: A Little Help needed to walk in hospital room?: A Little Help needed climbing 3-5 steps with a railing? : A Little 6 Click Score: 20    End of Session Equipment Utilized During Treatment: Gait belt Activity Tolerance: Patient tolerated treatment well Patient left: in chair;with call bell/phone within reach Nurse Communication: Mobility status PT Visit Diagnosis: Muscle weakness (generalized) (M62.81)    Time: 1345-1410 PT Time Calculation (min) (ACUTE ONLY): 25 min   Charges:   PT Evaluation $PT Eval Low Complexity: 1 Low PT Treatments $Gait Training: 8-22 mins        Tresa Endo PT Acute Rehabilitation Services Pager [phone] Office [phone]   Claretha Cooper 07/07/2021, 2:20 PM

## 2021-07-07 NOTE — Discharge Instructions (Signed)
? ?Dr. Nadea Kirkland ?Joint Replacement Specialist ?[facility] ?[address] ?[place], [place] [address] ?[phone] ? ? ?TOTAL HIP REPLACEMENT POSTOPERATIVE DIRECTIONS ? ? ? ?Hip Rehabilitation, Guidelines Following Surgery  ? ?WEIGHT BEARING ?Weight bearing as tolerated with assist device (walker, cane, etc) as directed, use it as long as suggested by your surgeon or therapist, typically at least 4-6 weeks. ? ?The results of a hip operation are greatly improved after range of motion and muscle strengthening exercises. Follow all safety measures which are given to protect your hip. If any of these exercises cause increased pain or swelling in your joint, decrease the amount until you are comfortable again. Then slowly increase the exercises. Call your caregiver if you have problems or questions.  ? ?HOME CARE INSTRUCTIONS  ?Most of the following instructions are designed to prevent the dislocation of your new hip.  ?Remove items at home which could result in a fall. This includes throw rugs or furniture in walking pathways.  ?Continue medications as instructed at time of discharge. ?You may have some home medications which will be placed on hold until you complete the course of blood thinner medication. ?You may start showering once you are discharged home. Do not remove your dressing. ?Do not put on socks or shoes without following the instructions of your caregivers.   ?Sit on chairs with arms. Use the chair arms to help push yourself up when arising.  ?Arrange for the use of a toilet seat elevator so you are not sitting low.  ?Walk with walker as instructed.  ?You may resume a sexual relationship in one month or when given the OK by your caregiver.  ?Use walker as long as suggested by your caregivers.  ?You may put full weight on your legs and walk as much as is comfortable. ?Avoid periods of inactivity such as sitting longer than an hour when not asleep. This helps prevent blood  clots.  ?You may return to work once you are cleared by your surgeon.  ?Do not drive a car for 6 weeks or until released by your surgeon.  ?Do not drive while taking narcotics.  ?Wear elastic stockings for two weeks following surgery during the day but you may remove then at night.  ?Make sure you keep all of your appointments after your operation with all of your doctors and caregivers. You should call the office at the above phone number and make an appointment for approximately two weeks after the date of your surgery. ?Please pick up a stool softener and laxative for home use as long as you are requiring pain medications. ?ICE to the affected hip every three hours for 30 minutes at a time and then as needed for pain and swelling. Continue to use ice on the hip for pain and swelling from surgery. You may notice swelling that will progress down to the foot and ankle.  This is normal after surgery.  Elevate the leg when you are not up walking on it.   ?It is important for you to complete the blood thinner medication as prescribed by your doctor. ?Continue to use the breathing machine which will help keep your temperature down.  It is common for your temperature to cycle up and down following surgery, especially at night when you are not up moving around and exerting yourself.  The breathing machine keeps your lungs expanded and your temperature down. ? ?RANGE OF MOTION AND STRENGTHENING EXERCISES  ?These exercises are designed to help you   keep full movement of your hip joint. Follow your caregiver's or physical therapist's instructions. Perform all exercises about fifteen times, three times per day or as directed. Exercise both hips, even if you have had only one joint replacement. These exercises can be done on a training (exercise) mat, on the floor, on a table or on a bed. Use whatever works the best and is most comfortable for you. Use music or television while you are exercising so that the exercises are a  pleasant break in your day. This will make your life better with the exercises acting as a break in routine you can look forward to.  ?Lying on your back, slowly slide your foot toward your buttocks, raising your knee up off the floor. Then slowly slide your foot back down until your leg is straight again.  ?Lying on your back spread your legs as far apart as you can without causing discomfort.  ?Lying on your side, raise your upper leg and foot straight up from the floor as far as is comfortable. Slowly lower the leg and repeat.  ?Lying on your back, tighten up the muscle in the front of your thigh (quadriceps muscles). You can do this by keeping your leg straight and trying to raise your heel off the floor. This helps strengthen the largest muscle supporting your knee.  ?Lying on your back, tighten up the muscles of your buttocks both with the legs straight and with the knee bent at a comfortable angle while keeping your heel on the floor.  ? ?SKILLED REHAB INSTRUCTIONS: ?If the patient is transferred to a skilled rehab facility following release from the hospital, a list of the current medications will be sent to the facility for the patient to continue.  When discharged from the skilled rehab facility, please have the facility set up the patient's Home Health Physical Therapy prior to being released. Also, the skilled facility will be responsible for providing the patient with their medications at time of release from the facility to include their pain medication and their blood thinner medication. If the patient is still at the rehab facility at time of the two week follow up appointment, the skilled rehab facility will also need to assist the patient in arranging follow up appointment in our office and any transportation needs. ? ?POST-OPERATIVE OPIOID TAPER INSTRUCTIONS: ?It is important to wean off of your opioid medication as soon as possible. If you do not need pain medication after your surgery it is ok  to stop day one. ?Opioids include: ?Codeine, Hydrocodone(Norco, Vicodin), Oxycodone(Percocet, oxycontin) and hydromorphone amongst others.  ?Long term and even short term use of opiods can cause: ?Increased pain response ?Dependence ?Constipation ?Depression ?Respiratory depression ?And more.  ?Withdrawal symptoms can include ?Flu like symptoms ?Nausea, vomiting ?And more ?Techniques to manage these symptoms ?Hydrate well ?Eat regular healthy meals ?Stay active ?Use relaxation techniques(deep breathing, meditating, yoga) ?Do Not substitute Alcohol to help with tapering ?If you have been on opioids for less than two weeks and do not have pain than it is ok to stop all together.  ?Plan to wean off of opioids ?This plan should start within one week post op of your joint replacement. ?Maintain the same interval or time between taking each dose and first decrease the dose.  ?Cut the total daily intake of opioids by one tablet each day ?Next start to increase the time between doses. ?The last dose that should be eliminated is the evening dose.  ? ? ?MAKE   SURE YOU:  ?Understand these instructions.  ?Will watch your condition.  ?Will get help right away if you are not doing well or get worse. ? ?Pick up stool softner and laxative for home use following surgery while on pain medications. ?Do not remove your dressing. ?The dressing is waterproof--it is OK to take showers. ?Continue to use ice for pain and swelling after surgery. ?Do not use any lotions or creams on the incision until instructed by your surgeon. ?Total Hip Protocol. ? ?

## 2021-07-07 NOTE — Op Note (Signed)
OPERATIVE REPORT  SURGEON: Rod Can, MD   ASSISTANT: Larene Pickett, PA-C.  PREOPERATIVE DIAGNOSIS: Right hip arthritis.   POSTOPERATIVE DIAGNOSIS: Right hip arthritis.   PROCEDURE: Right total hip arthroplasty, anterior approach.   IMPLANTS: Biomet Taperloc Complete Microplasty stem, size 14 x 113 mm, high offset. Biomet G7 OsseoTi Cup, size 52 mm. Biomet Vivacit-E liner, size 36 mm, E, neutral. Biomet Biolox ceramic head ball, size 36 + 0 mm.  ANESTHESIA:  MAC and Spinal  ESTIMATED BLOOD LOSS:-200 mL    ANTIBIOTICS: 2 g Ancef.  DRAINS: None.  COMPLICATIONS: None.   CONDITION: PACU - hemodynamically stable.   BRIEF CLINICAL NOTE: Holly Stephens is a 66 y.o. female with a long-standing history of Right hip arthritis. After failing conservative management, the patient was indicated for total hip arthroplasty. The risks, benefits, and alternatives to the procedure were explained, and the patient elected to proceed.  PROCEDURE IN DETAIL: Surgical site was marked by myself in the pre-op holding area. Once inside the operating room, spinal anesthesia was obtained, and a foley catheter was inserted. The patient was then positioned on the Hana table.  All bony prominences were well padded.  The hip was prepped and draped in the normal sterile surgical fashion.  A time-out was called verifying side and site of surgery. The patient received IV antibiotics within 60 minutes of beginning the procedure.   Bikini incision was made, and superficial dissection was performed lateral to the ASIS. The direct anterior approach to the hip was performed through the Hueter interval.  Lateral femoral circumflex vessels were treated with the Auqumantys. The anterior capsule was exposed and an inverted T capsulotomy was made. The femoral neck cut was made to the level of the templated cut.  A corkscrew was placed into the head and the head was removed.  The femoral head was found to have  eburnated bone. The head was passed to the back table and was measured. Pubofemoral ligament was released off of the calcar, taking care to stay on bone. Superior capsule was released from the greater trochanter, taking care to stay lateral to the posterior border of the femoral neck in order to preserve the short external rotators.   Acetabular exposure was achieved, and the pulvinar and labrum were excised. Sequential reaming of the acetabulum was then performed up to a size 51 mm reamer. A 52 mm cup was then opened and impacted into place at approximately 40 degrees of abduction and 20 degrees of anteversion. The final polyethylene liner was impacted into place and acetabular osteophytes were removed.    I then gained femoral exposure taking care to protect the abductors and greater trochanter.  This was performed using standard external rotation, extension, and adduction.  A cookie cutter was used to enter the femoral canal, and then the femoral canal finder was placed.  Sequential broaching was performed up to a size 14.  Calcar planer was used on the femoral neck remnant.  I placed a high offset neck and a trial head ball.  The hip was reduced.  Leg lengths and offset were checked fluoroscopically.  The hip was dislocated and trial components were removed.  The final implants were placed, and the hip was reduced.  Fluoroscopy was used to confirm component position and leg lengths.  At 90 degrees of external rotation and full extension, the hip was stable to an anterior directed force.   The wound was copiously irrigated with Prontosan solution and normal saline using pule lavage.  Marcaine solution was injected into the periarticular soft tissue.  The wound was closed in layers using #1 Vicryl and V-Loc for the fascia, 2-0 Vicryl for the subcutaneous fat, 2-0 Monocryl for the deep dermal layer, and staples +  Dermabond for the skin.  Once the glue was fully dried, an Aquacell Ag dressing was applied.   The patient was transported to the recovery room in stable condition.  Sponge, needle, and instrument counts were correct at the end of the case x2.  The patient tolerated the procedure well and there were no known complications.  Please note that a surgical assistant was a medical necessity for this procedure to perform it in a safe and expeditious manner. Assistant was necessary to provide appropriate retraction of vital neurovascular structures, to prevent femoral fracture, and to allow for anatomic placement of the prosthesis.

## 2021-07-07 NOTE — Interval H&P Note (Signed)
History and Physical Interval Note:  07/07/2021 7:23 AM  Holly Stephens  has presented today for surgery, with the diagnosis of Right hip osteoarthritis.  The various methods of treatment have been discussed with the patient and family. After consideration of risks, benefits and other options for treatment, the patient has consented to  Procedure(s) with comments: Rodney (Right) - 150 as a surgical intervention.  The patient's history has been reviewed, patient examined, no change in status, stable for surgery.  I have reviewed the patient's chart and labs.  Questions were answered to the patient's satisfaction.     Hilton Cork [place]

## 2021-07-07 NOTE — Anesthesia Procedure Notes (Signed)
Procedure Name: MAC Date/Time: 07/07/2021 7:32 AM Performed by: West Pugh, CRNA Pre-anesthesia Checklist: Patient identified, Emergency Drugs available, Suction available, Patient being monitored and Timeout performed Patient Re-evaluated:Patient Re-evaluated prior to induction Oxygen Delivery Method: Simple face mask Preoxygenation: Pre-oxygenation with 100% oxygen Induction Type: IV induction Placement Confirmation: positive ETCO2 Dental Injury: Teeth and Oropharynx as per pre-operative assessment

## 2021-07-08 ENCOUNTER — Encounter (HOSPITAL_COMMUNITY): Payer: Self-pay | Admitting: Orthopedic Surgery

## 2021-07-21 DIAGNOSIS — E119 Type 2 diabetes mellitus without complications: Secondary | ICD-10-CM | POA: Diagnosis not present

## 2021-07-22 ENCOUNTER — Ambulatory Visit: Payer: BC Managed Care – PPO | Admitting: Podiatry

## 2021-07-22 DIAGNOSIS — E669 Obesity, unspecified: Secondary | ICD-10-CM | POA: Diagnosis not present

## 2021-07-22 DIAGNOSIS — Z6835 Body mass index (BMI) 35.0-35.9, adult: Secondary | ICD-10-CM | POA: Diagnosis not present

## 2021-07-22 DIAGNOSIS — Z713 Dietary counseling and surveillance: Secondary | ICD-10-CM | POA: Diagnosis not present

## 2021-07-22 DIAGNOSIS — Z4789 Encounter for other orthopedic aftercare: Secondary | ICD-10-CM | POA: Diagnosis not present

## 2021-08-19 DIAGNOSIS — Z4789 Encounter for other orthopedic aftercare: Secondary | ICD-10-CM | POA: Diagnosis not present

## 2021-08-24 DIAGNOSIS — E669 Obesity, unspecified: Secondary | ICD-10-CM | POA: Diagnosis not present

## 2021-08-24 DIAGNOSIS — Z6836 Body mass index (BMI) 36.0-36.9, adult: Secondary | ICD-10-CM | POA: Diagnosis not present

## 2021-08-24 DIAGNOSIS — I10 Essential (primary) hypertension: Secondary | ICD-10-CM | POA: Diagnosis not present

## 2021-08-24 DIAGNOSIS — E119 Type 2 diabetes mellitus without complications: Secondary | ICD-10-CM | POA: Diagnosis not present

## 2021-09-01 DIAGNOSIS — Z713 Dietary counseling and surveillance: Secondary | ICD-10-CM | POA: Diagnosis not present

## 2021-09-01 DIAGNOSIS — Z6836 Body mass index (BMI) 36.0-36.9, adult: Secondary | ICD-10-CM | POA: Diagnosis not present

## 2021-09-01 DIAGNOSIS — E669 Obesity, unspecified: Secondary | ICD-10-CM | POA: Diagnosis not present

## 2021-09-19 ENCOUNTER — Ambulatory Visit (INDEPENDENT_AMBULATORY_CARE_PROVIDER_SITE_OTHER): Payer: BC Managed Care – PPO | Admitting: Podiatry

## 2021-09-19 ENCOUNTER — Encounter: Payer: Self-pay | Admitting: Podiatry

## 2021-09-19 DIAGNOSIS — M79675 Pain in left toe(s): Secondary | ICD-10-CM

## 2021-09-19 DIAGNOSIS — M79674 Pain in right toe(s): Secondary | ICD-10-CM

## 2021-09-19 DIAGNOSIS — B351 Tinea unguium: Secondary | ICD-10-CM

## 2021-09-19 DIAGNOSIS — E119 Type 2 diabetes mellitus without complications: Secondary | ICD-10-CM

## 2021-09-24 NOTE — Progress Notes (Signed)
  Subjective:  Patient ID: Holly Stephens, female    DOB: 27-Jan-1956,  MRN: [id number]  Holly Stephens presents to clinic today for preventative diabetic foot care and painful thick toenails that are difficult to trim. Pain interferes with ambulation. Aggravating factors include wearing enclosed shoe gear. Pain is relieved with periodic professional debridement.  New problem(s): None.   She is recovering from hip surgery.  PCP is Celene Squibb, MD.  Allergies  Allergen Reactions   Augmentin [Amoxicillin-Pot Clavulanate] Hives and Other (See Comments)    Has patient had a PCN reaction causing immediate rash, facial/tongue/throat swelling, SOB or lightheadedness with hypotension: No Has patient had a PCN reaction causing severe rash involving mucus membranes or skin necrosis: No Has patient had a PCN reaction that required hospitalization No Has patient had a PCN reaction occurring within the last 10 years: No If all of the above answers are "NO", then may proceed with Cephalosporin use.   Bleomycin Cough   Cefpodoxime Proxetil Itching and Rash    Pt unsure of allergy    Doxycycline Rash    Review of Systems: Negative except as noted in the HPI.  Objective: No changes noted in today's physical examination. General: Holly Stephens is a pleasant 66 y.o. Caucasian female, obese in NAD. AAO x 3.   Vascular:  Capillary refill time to digits immediate b/l. Palpable pedal pulses b/l LE. Pedal hair sparse. Lower extremity skin temperature gradient within normal limits. No pain with calf compression b/l. No edema noted b/l lower extremities.  Dermatological:  Pedal skin with normal turgor, texture and tone b/l lower extremities. No open wounds b/l lower extremities. No interdigital macerations b/l lower extremities. Toenails 1-5 b/l elongated, discolored, dystrophic, thickened, crumbly with subungual debris and tenderness to dorsal palpation. Hyperkeratotic lesion(s) L hallux.  No erythema,  no edema, no drainage, no fluctuance.  Musculoskeletal:  Normal muscle strength 5/5 to all lower extremity muscle groups bilaterally. No pain crepitus or joint limitation noted with ROM b/l lower extremities. 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  Neurological:  Pt has subjective symptoms of neuropathy. Protective sensation intact 5/5 intact bilaterally with 10g monofilament b/l.     Latest Ref Rng & Units 07/07/2021   10:45 AM 07/01/2021    8:30 AM  Hemoglobin A1C  Hemoglobin-A1c 4.8 - 5.6 % 5.5  5.4    Assessment/Plan: 1. Pain due to onychomycosis of toenails of both feet   2. Controlled type 2 diabetes mellitus without complication, without long-term current use of insulin ([place])      -Patient was evaluated and treated. All patient's and/or POA's questions/concerns answered on today's visit. -Patient to continue soft, supportive shoe gear daily. -Toenails 1-5 b/l were debrided in length and girth with sterile nail nippers and dremel without iatrogenic bleeding.  -Patient/POA to call should there be question/concern in the interim.   Return in about 3 months (around 12/20/2021).  Marzetta Board, DPM

## 2021-10-18 DIAGNOSIS — Z713 Dietary counseling and surveillance: Secondary | ICD-10-CM | POA: Diagnosis not present

## 2021-10-18 DIAGNOSIS — E669 Obesity, unspecified: Secondary | ICD-10-CM | POA: Diagnosis not present

## 2021-10-18 DIAGNOSIS — Z6838 Body mass index (BMI) 38.0-38.9, adult: Secondary | ICD-10-CM | POA: Diagnosis not present

## 2021-11-25 DIAGNOSIS — I10 Essential (primary) hypertension: Secondary | ICD-10-CM | POA: Diagnosis not present

## 2021-11-25 DIAGNOSIS — E119 Type 2 diabetes mellitus without complications: Secondary | ICD-10-CM | POA: Diagnosis not present

## 2021-12-02 DIAGNOSIS — E785 Hyperlipidemia, unspecified: Secondary | ICD-10-CM | POA: Diagnosis not present

## 2021-12-02 DIAGNOSIS — I10 Essential (primary) hypertension: Secondary | ICD-10-CM | POA: Diagnosis not present

## 2021-12-02 DIAGNOSIS — E1165 Type 2 diabetes mellitus with hyperglycemia: Secondary | ICD-10-CM | POA: Diagnosis not present

## 2021-12-02 DIAGNOSIS — D751 Secondary polycythemia: Secondary | ICD-10-CM | POA: Diagnosis not present

## 2021-12-05 ENCOUNTER — Other Ambulatory Visit (HOSPITAL_COMMUNITY): Payer: Self-pay | Admitting: Family Medicine

## 2021-12-05 DIAGNOSIS — Z1231 Encounter for screening mammogram for malignant neoplasm of breast: Secondary | ICD-10-CM

## 2021-12-14 ENCOUNTER — Ambulatory Visit (HOSPITAL_COMMUNITY)
Admission: RE | Admit: 2021-12-14 | Discharge: 2021-12-14 | Disposition: A | Discharge status: Home / Self Care | Payer: BC Managed Care – PPO | Source: Ambulatory Visit | Attending: Family Medicine | Admitting: Family Medicine

## 2021-12-14 DIAGNOSIS — Z1231 Encounter for screening mammogram for malignant neoplasm of breast: Secondary | ICD-10-CM | POA: Diagnosis not present

## 2021-12-15 DIAGNOSIS — I10 Essential (primary) hypertension: Secondary | ICD-10-CM | POA: Diagnosis not present

## 2021-12-15 DIAGNOSIS — Z96641 Presence of right artificial hip joint: Secondary | ICD-10-CM | POA: Diagnosis not present

## 2021-12-15 DIAGNOSIS — R635 Abnormal weight gain: Secondary | ICD-10-CM | POA: Diagnosis not present

## 2021-12-15 DIAGNOSIS — Z713 Dietary counseling and surveillance: Secondary | ICD-10-CM | POA: Diagnosis not present

## 2021-12-22 ENCOUNTER — Encounter: Payer: Self-pay | Admitting: Internal Medicine

## 2021-12-22 ENCOUNTER — Other Ambulatory Visit: Payer: Self-pay

## 2021-12-22 ENCOUNTER — Other Ambulatory Visit (HOSPITAL_COMMUNITY): Payer: Self-pay

## 2021-12-22 ENCOUNTER — Ambulatory Visit (INDEPENDENT_AMBULATORY_CARE_PROVIDER_SITE_OTHER): Payer: BC Managed Care – PPO | Admitting: Internal Medicine

## 2021-12-22 VITALS — BP 117/74 | HR 72 | Temp 97.5°F | Ht 61.0 in | Wt 215.0 lb

## 2021-12-22 DIAGNOSIS — B20 Human immunodeficiency virus [HIV] disease: Secondary | ICD-10-CM

## 2021-12-22 NOTE — Progress Notes (Signed)
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   Acute deep vein thrombosis (DVT) of popliteal vein of left lower extremity ([place]) 02/09/2016   Congestive heart failure with cardiomyopathy ([place]) 10/27/2015   Acute congestive heart failure ([place]) 10/27/2015   Hodgkin's disease, nodular sclerosis, of lymph nodes of multiple sites ([place]) 08/23/2015   Visual floaters 09/12/2011   Episodic low back pain 11/15/2010   ROTATOR CUFF INJURY, RIGHT SHOULDER 11/03/2008   ADHESIVE CAPSULITIS OF SHOULDER 11/12/2007   OBESITY NOS 05/17/2006   SYNDROME, CARPAL TUNNEL 05/17/2006   FOOT PAIN 05/17/2006   Hyperlipidemia 02/23/2006   DEPRESSION 02/23/2006   Essential hypertension 02/23/2006    Patient's Medications  New Prescriptions   No medications on file  Previous Medications   APIXABAN (ELIQUIS) 2.5 MG TABS TABLET    Take 1 tablet (2.5 mg total) by mouth 2 (two) times daily.   BUPROPION (WELLBUTRIN XL) 150 MG 24 HR TABLET    Take 300 mg by mouth daily.   CHOLECALCIFEROL (VITAMIN D) 1000 UNITS TABLET    Take 1,000 Units by mouth daily.    DOLUTEGRAVIR (TIVICAY) 50 MG TABLET    TAKE 1 TABLET DAILY. TAKE WITH ODEFSEY.   EMTRICITABINE-RILPIVIR-TENOFOVIR AF (ODEFSEY) 200-25-25 MG TABS TABLET    TAKE 1 TABLET DAILY WITH BREAKFAST. TAKE WITH TIVICAY.   FAMOTIDINE (PEPCID) 20 MG TABLET    Take 1 tablet by mouth 2 (two) times daily.   FENOFIBRATE 160 MG TABLET    Take 1 tablet by mouth daily.   FUROSEMIDE (LASIX) 20 MG TABLET    Take 1 tablet (20 mg total) by mouth daily.   GLUCOSAMINE-CHONDROITIN 500-400 MG TABLET       HYDROCODONE-ACETAMINOPHEN (NORCO) 10-325 MG TABLET    TAKE 1/2  (ONE-HALF) TABLET BY MOUTH EVERY 4 HOURS AS NEEDED FOR UP TO 7 DAYS   JARDIANCE 25 MG TABS TABLET    Take 25 mg by mouth daily.    METAMUCIL FIBER CHEW    Chew 3 each by mouth daily as needed (constipation).   METFORMIN (GLUCOPHAGE-XR) 500 MG 24 HR TABLET    Take 500 mg by mouth 2 (two) times daily.   METOPROLOL TARTRATE (LOPRESSOR) 25 MG TABLET    Take 1 tablet (25 mg total) by mouth daily.   MULTIPLE VITAMIN (MULTIVITAMIN WITH MINERALS) TABS TABLET    Take 1 tablet by mouth daily.   ONDANSETRON (ZOFRAN) 4 MG TABLET    Take 1 tablet (4 mg total) by mouth every 8 (eight) hours as needed for nausea or vomiting.   OZEMPIC, 1 MG/DOSE, 4 MG/3ML SOPN    Inject 1 mg into the skin every Friday.   POLYETHYLENE GLYCOL (MIRALAX) 17 G PACKET    Take 17 g by mouth daily as needed for moderate constipation or severe constipation.   REPATHA 140 MG/ML SOSY    Inject 140 mg into the muscle every 14 (fourteen) days.   SPIRONOLACTONE (ALDACTONE) 25 MG TABLET    TAKE 1 TABLET DAILY   VITAMIN E 400 UNIT CAPSULE    Take 400 Units by mouth daily.  Modified Medications   No medications on file  Discontinued Medications   No medications on file    Subjective: Holly Stephens is in for her routine HIV follow-up visit.  She underwent right total hip arthroplasty in May of this year.  Shortly after her surgery she was told that her entire department at work was immediately terminated.  She did get a severance package but has had numerous hassles sorting out her health insurance.  She just recently got her Medicare coverage approved but she is not sure how much it will cost to get all of her medications.  Fortunately she has not had any difficulty getting Odefsey or Tivicay so far and she has not missed any doses.  She has recovered uneventfully from her hip surgery.  Review of Systems: Review of Systems  Constitutional:  Positive for weight loss. Negative for fever.  Musculoskeletal:  Positive for joint pain.   Psychiatric/Behavioral:  Negative for depression.     Past Medical History:  Diagnosis Date   Acute deep vein thrombosis (DVT) of popliteal vein of left lower extremity ([place]) 02/09/2016   Arthritis    osteoarthritis   CHF (congestive heart failure) ([facility])    EF NL   Diabetes mellitus without complication ([place])    History of radiation therapy 04/25/2016 - 05/23/2016   Left Axilla treated to 36 Gy in 20 fractions   [name], nodular sclerosis ([place]) 08/23/2015   Human immunodeficiency virus (HIV) disease ([place]) 02/23/2006   Qualifier: Diagnosis of  By: Megan Salon MD, Lauris Keepers     Hypertension     Social History   Tobacco Use   Smoking status: Never   Smokeless tobacco: Never  Vaping Use   Vaping Use: Never used  Substance Use Topics   Alcohol use: No   Drug use: No    Family History  Problem Relation Age of Onset   Hypertension Mother    Cancer Mother 60       breast    Hypertension Father    Heart attack Father 79   Pulmonary embolism Sister    Parkinson's disease Brother     Allergies  Allergen Reactions   Augmentin [Amoxicillin-Pot Clavulanate] Hives and Other (See Comments)    Has patient had a PCN reaction causing immediate rash, facial/tongue/throat swelling, SOB or lightheadedness with hypotension: No Has patient had a PCN reaction causing severe rash involving mucus membranes or skin necrosis: No Has patient had a PCN reaction that required hospitalization No Has patient had a PCN reaction occurring within the last 10 years: No If all of the above answers are "NO", then may proceed with Cephalosporin use.   Bleomycin Cough   Cefpodoxime Proxetil Itching and Rash    Pt unsure of allergy    Doxycycline Rash    Health Maintenance  Topic Date Due   OPHTHALMOLOGY EXAM  Never done   Diabetic kidney evaluation - Urine ACR  Never done   Zoster Vaccines- Shingrix (1 of 2) Never done   COLONOSCOPY (Pts 45-54yr Insurance coverage will need to be confirmed)   Never done   Pneumonia Vaccine 66 Years old (3 - PCV) 11/15/2011   COVID-19 Vaccine (3 - Moderna risk series) 07/04/2019   FOOT EXAM  01/03/2022   HEMOGLOBIN A1C  01/07/2022   Diabetic kidney evaluation - GFR measurement  06/25/2022   TETANUS/TDAP  11/06/2022   MAMMOGRAM  12/15/2023   DEXA SCAN  Completed   Hepatitis C Screening  Completed   HPV VACCINES  Aged Out    Objective:  Vitals:  12/22/21 1028  BP: 117/74  Pulse: 72  Temp: (!) 97.5 F (36.4 C)  TempSrc: Oral  Weight: 215 lb (97.5 kg)  Height: '5\' 1"'$  (1.549 m)   Body mass index is 40.62 kg/m.  Physical Exam Constitutional:      Comments: She is in good spirits.  Cardiovascular:     Rate and Rhythm: Normal rate.  Pulmonary:     Effort: Pulmonary effort is normal.  Psychiatric:        Mood and Affect: Mood normal.     Lab Results Lab Results  Component Value Date   WBC 7.8 06/24/2021   HGB 16.6 (H) 06/24/2021   HCT 51.4 (H) 06/24/2021   MCV 95.0 06/24/2021   PLT 245 06/24/2021    Lab Results  Component Value Date   CREATININE 0.53 06/24/2021   BUN 20 06/24/2021   NA 141 06/24/2021   K 4.4 06/24/2021   CL 104 06/24/2021   CO2 28 06/24/2021    Lab Results  Component Value Date   ALT 21 06/24/2021   AST 19 06/24/2021   ALKPHOS 44 06/24/2021   BILITOT 0.5 06/24/2021    Lab Results  Component Value Date   CHOL 115 05/26/2021   HDL 53 05/26/2021   LDLCALC 38 05/26/2021   TRIG 160 (H) 05/26/2021   CHOLHDL 2.2 05/26/2021   Lab Results  Component Value Date   LABRPR NON-REACTIVE 05/26/2021   HIV 1 RNA Quant  Date Value  05/26/2021 NOT DETECTED copies/mL  05/26/2020 Not Detected Copies/mL  09/04/2019 55 copies/mL (H)   CD4 T Cell Abs (/uL)  Date Value  05/26/2021 662  05/26/2020 850  09/04/2019 984     Problem List Items Addressed This Visit       High   Human immunodeficiency virus (HIV) disease ([place]) - Primary    Her infection has been under excellent, long-term control on  her current salvage regimen.  She is likely to have more difficulty with co-pays for her multiple medications now that she is on Medicare.  I will work with our pharmacist to review past resistance assays and see if there is any way we can safely consolidate to a single pill regimen.  She will follow-up with me in 3 months for repeat blood work.  She recently had an annual influenza vaccine, updated COVID-vaccine and the new RSV vaccine.         Michel Bickers, MD [facility] [phone] pager   [phone] cell 12/22/2021, 12:10 PM

## 2021-12-22 NOTE — Assessment & Plan Note (Signed)
Her infection has been under excellent, long-term control on her current salvage regimen.  She is likely to have more difficulty with co-pays for her multiple medications now that she is on Medicare.  I will work with our pharmacist to review past resistance assays and see if there is any way we can safely consolidate to a single pill regimen.  She will follow-up with me in 3 months for repeat blood work.  She recently had an annual influenza vaccine, updated COVID-vaccine and the new RSV vaccine.

## 2021-12-26 ENCOUNTER — Other Ambulatory Visit: Payer: Self-pay

## 2021-12-26 ENCOUNTER — Telehealth: Payer: Self-pay | Admitting: Pharmacist

## 2021-12-26 DIAGNOSIS — C8118 Nodular sclerosis classical Hodgkin lymphoma, lymph nodes of multiple sites: Secondary | ICD-10-CM

## 2021-12-26 NOTE — Telephone Encounter (Addendum)
Cumulative HIV Genotype Data  RT Mutations  K70R, K219Q, T69NS  PI Mutations  none  Integrase Mutations  none   Interpretation of Genotype Data per [employer] HIV Drug Resistance Database:  Nucleoside RTIs  Abacavir - potential low level resistance Zidovudine - intermediate resistance Emtricitabine - susceptible Lamivudine - susceptible Tenofovir - potential low level resistance   Non-Nucleoside RTIs  Doravirine - susceptible Efavirenz - susceptible Etravirine - susceptible Nevirapine - susceptible Rilpivirine - susceptible   Protease Inhibitors  Atazanavir - susceptible Darunavir - susceptible Lopinavir - susceptible   Integrase Inhibitors  Bictegravir - susceptible Cabotegravir - susceptible Dolutegravir - susceptible Elvitegravir - susceptible Raltegravir - susceptible   Treatment history: 1994 - zidovudine 1996 - zidovudine + lamivudine  1997 - lamivudine + stavudine + ritonavir + saquinavir  Sept 2002 - lamivudine + stavudine + efavirenz (stopped ritonavir and saquinavir)  Dec 2002 - stopped efavirenz and started Kaletra + lamivudine + stavudine  Stopped everything [facility] 2003  November 2003 - lamivudine + stavudine + ritonavir + fosamprenavir  2011 (could not find exactly when she transitioned) - Combivir + prezista + norvir  2014 - prezista + norvir + lamivudine + dolutegravir  2015 - Prezcobix + dolutegravir + lamivudine  2016 - [name] + [name]  2017 to 2023 - Odefsey + Tivicay (to avoid drug interactions with chemo)   Mandisa Persinger L. Anoop Hemmer, PharmD, BCIDP, [place], CPP Clinical Pharmacist Practitioner Infectious [facility] [place] [facility] 01/09/2022, 2:02 PM

## 2021-12-27 ENCOUNTER — Inpatient Hospital Stay: Payer: BC Managed Care – PPO | Attending: Physician Assistant

## 2021-12-27 DIAGNOSIS — Z880 Allergy status to penicillin: Secondary | ICD-10-CM | POA: Insufficient documentation

## 2021-12-27 DIAGNOSIS — Z8249 Family history of ischemic heart disease and other diseases of the circulatory system: Secondary | ICD-10-CM | POA: Diagnosis not present

## 2021-12-27 DIAGNOSIS — Z803 Family history of malignant neoplasm of breast: Secondary | ICD-10-CM | POA: Insufficient documentation

## 2021-12-27 DIAGNOSIS — Z881 Allergy status to other antibiotic agents status: Secondary | ICD-10-CM | POA: Diagnosis not present

## 2021-12-27 DIAGNOSIS — I110 Hypertensive heart disease with heart failure: Secondary | ICD-10-CM | POA: Insufficient documentation

## 2021-12-27 DIAGNOSIS — Z820 Family history of epilepsy and other diseases of the nervous system: Secondary | ICD-10-CM | POA: Insufficient documentation

## 2021-12-27 DIAGNOSIS — C8110 Nodular sclerosis classical Hodgkin lymphoma, unspecified site: Secondary | ICD-10-CM | POA: Diagnosis not present

## 2021-12-27 DIAGNOSIS — Z86718 Personal history of other venous thrombosis and embolism: Secondary | ICD-10-CM | POA: Diagnosis not present

## 2021-12-27 DIAGNOSIS — C8118 Nodular sclerosis classical Hodgkin lymphoma, lymph nodes of multiple sites: Secondary | ICD-10-CM

## 2021-12-27 DIAGNOSIS — Z79899 Other long term (current) drug therapy: Secondary | ICD-10-CM | POA: Diagnosis not present

## 2021-12-27 LAB — COMPREHENSIVE METABOLIC PANEL
ALT: 29 U/L (ref 0–44)
AST: 28 U/L (ref 15–41)
Albumin: 4.1 g/dL (ref 3.5–5.0)
Alkaline Phosphatase: 40 U/L (ref 38–126)
Anion gap: 10 (ref 5–15)
BUN: 22 mg/dL (ref 8–23)
CO2: 28 mmol/L (ref 22–32)
Calcium: 9.9 mg/dL (ref 8.9–10.3)
Chloride: 102 mmol/L (ref 98–111)
Creatinine, Ser: 0.54 mg/dL (ref 0.44–1.00)
GFR, Estimated: >60 mL/min (ref >60)
Glucose, Bld: 110 mg/dL — ABNORMAL HIGH (ref 70–99)
Potassium: 4.1 mmol/L (ref 3.5–5.1)
Sodium: 140 mmol/L (ref 135–145)
Total Bilirubin: 0.8 mg/dL (ref 0.3–1.2)
Total Protein: 7.2 g/dL (ref 6.5–8.1)

## 2021-12-27 LAB — LACTATE DEHYDROGENASE: LDH: 117 U/L (ref 98–192)

## 2021-12-27 LAB — CBC WITH DIFFERENTIAL/PLATELET
Abs Immature Granulocytes: 0.03 10*3/uL (ref 0.00–0.07)
Basophils Absolute: 0.1 10*3/uL (ref 0.0–0.1)
Basophils Relative: 1 %
Eosinophils Absolute: 0.2 10*3/uL (ref 0.0–0.5)
Eosinophils Relative: 2 %
HCT: 49 % — ABNORMAL HIGH (ref 36.0–46.0)
Hemoglobin: 16.2 g/dL — ABNORMAL HIGH (ref 12.0–15.0)
Immature Granulocytes: 0 %
Lymphocytes Relative: 39 %
Lymphs Abs: 2.9 10*3/uL (ref 0.7–4.0)
MCH: 30.7 pg (ref 26.0–34.0)
MCHC: 33.1 g/dL (ref 30.0–36.0)
MCV: 92.8 fL (ref 80.0–100.0)
Monocytes Absolute: 0.6 10*3/uL (ref 0.1–1.0)
Monocytes Relative: 8 %
Neutro Abs: 3.7 10*3/uL (ref 1.7–7.7)
Neutrophils Relative %: 50 %
Platelets: 269 10*3/uL (ref 150–400)
RBC: 5.28 MIL/uL — ABNORMAL HIGH (ref 3.87–5.11)
RDW: 13.9 % (ref 11.5–15.5)
WBC: 7.5 10*3/uL (ref 4.0–10.5)
nRBC: 0 % (ref 0.0–0.2)

## 2021-12-27 LAB — VITAMIN D 25 HYDROXY (VIT D DEFICIENCY, FRACTURES): Vit D, 25-Hydroxy: 31.03 ng/mL (ref 30–100)

## 2021-12-28 ENCOUNTER — Ambulatory Visit: Payer: BC Managed Care – PPO | Admitting: Internal Medicine

## 2021-12-28 LAB — PTH, INTACT AND CALCIUM
Calcium, Total (PTH): 10.3 mg/dL (ref 8.7–10.3)
PTH: 15 pg/mL (ref 15–65)

## 2021-12-28 LAB — KAPPA/LAMBDA LIGHT CHAINS
Kappa free light chain: 18.4 mg/L (ref 3.3–19.4)
Kappa, lambda light chain ratio: 1.36 (ref 0.26–1.65)
Lambda free light chains: 13.5 mg/L (ref 5.7–26.3)

## 2021-12-29 ENCOUNTER — Other Ambulatory Visit: Payer: Self-pay

## 2021-12-29 DIAGNOSIS — B20 Human immunodeficiency virus [HIV] disease: Secondary | ICD-10-CM

## 2021-12-29 LAB — PROTEIN ELECTROPHORESIS, SERUM
A/G Ratio: 1.4 (ref 0.7–1.7)
Albumin ELP: 3.8 g/dL (ref 2.9–4.4)
Alpha-1-Globulin: 0.2 g/dL (ref 0.0–0.4)
Alpha-2-Globulin: 0.8 g/dL (ref 0.4–1.0)
Beta Globulin: 1.1 g/dL (ref 0.7–1.3)
Gamma Globulin: 0.6 g/dL (ref 0.4–1.8)
Globulin, Total: 2.7 g/dL (ref 2.2–3.9)
Total Protein ELP: 6.5 g/dL (ref 6.0–8.5)

## 2021-12-29 LAB — IMMUNOFIXATION ELECTROPHORESIS
IgA: 285 mg/dL (ref 87–352)
IgG (Immunoglobin G), Serum: 730 mg/dL (ref 586–1602)
IgM (Immunoglobulin M), Srm: 94 mg/dL (ref 26–217)
Total Protein ELP: 7 g/dL (ref 6.0–8.5)

## 2021-12-29 MED ORDER — TIVICAY 50 MG PO TABS: ORAL_TABLET | ORAL | 11 refills | Status: DC

## 2021-12-29 MED ORDER — ODEFSEY 200-25-25 MG PO TABS: ORAL_TABLET | ORAL | 11 refills | Status: DC

## 2021-12-30 ENCOUNTER — Ambulatory Visit (INDEPENDENT_AMBULATORY_CARE_PROVIDER_SITE_OTHER): Payer: BC Managed Care – PPO | Admitting: Podiatry

## 2021-12-30 ENCOUNTER — Encounter: Payer: Self-pay | Admitting: Podiatry

## 2021-12-30 DIAGNOSIS — E119 Type 2 diabetes mellitus without complications: Secondary | ICD-10-CM

## 2021-12-30 DIAGNOSIS — B351 Tinea unguium: Secondary | ICD-10-CM | POA: Diagnosis not present

## 2021-12-30 DIAGNOSIS — M79674 Pain in right toe(s): Secondary | ICD-10-CM

## 2021-12-30 DIAGNOSIS — M79675 Pain in left toe(s): Secondary | ICD-10-CM | POA: Diagnosis not present

## 2021-12-30 DIAGNOSIS — L84 Corns and callosities: Secondary | ICD-10-CM

## 2021-12-30 NOTE — Progress Notes (Signed)
  Subjective:  Patient ID: Holly Stephens, female    DOB: [birth year],  MRN: [id number]  Holly Stephens presents to clinic today for preventative diabetic foot care and callus(es) left lower extremity and painful thick toenails that are difficult to trim. Painful toenails interfere with ambulation. Aggravating factors include wearing enclosed shoe gear. Pain is relieved with periodic professional debridement. Painful calluses are aggravated when weightbearing with and without shoegear. Pain is relieved with periodic professional debridement.   Chief Complaint  Patient presents with   Nail Problem    Thick painful toenails, 3 month follow up   New problem(s): None.   PCP is Celene Squibb, MD.  Allergies  Allergen Reactions   Augmentin [Amoxicillin-Pot Clavulanate] Hives and Other (See Comments)    Has patient had a PCN reaction causing immediate rash, facial/tongue/throat swelling, SOB or lightheadedness with hypotension: No Has patient had a PCN reaction causing severe rash involving mucus membranes or skin necrosis: No Has patient had a PCN reaction that required hospitalization No Has patient had a PCN reaction occurring within the last 10 years: No If all of the above answers are "NO", then may proceed with Cephalosporin use.   Bleomycin Cough   Cefpodoxime Proxetil Itching and Rash    Pt unsure of allergy    Doxycycline Rash    Review of Systems: Negative except as noted in the HPI.  Objective: No changes noted in today's physical examination.  Holly Stephens is a pleasant 66 y.o. female obese in NAD. AAO x 3.  Vascular:  Capillary refill time to digits immediate b/l. Palpable pedal pulses b/l LE. Pedal hair sparse. Lower extremity skin temperature gradient within normal limits. No pain with calf compression b/l. No edema noted b/l lower extremities.  Dermatological:  Pedal skin with normal turgor, texture and tone b/l lower extremities. No open wounds b/l lower  extremities. No interdigital macerations b/l lower extremities. Toenails 1-5 b/l elongated, discolored, dystrophic, thickened, crumbly with subungual debris and tenderness to dorsal palpation. Hyperkeratotic lesion(s) L hallux.  No erythema, no edema, no drainage, no fluctuance.  Musculoskeletal:  Normal muscle strength 5/5 to all lower extremity muscle groups bilaterally. No pain crepitus or joint limitation noted with ROM b/l lower extremities. 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  Neurological:  Pt has subjective symptoms of neuropathy. Protective sensation intact 5/5 intact bilaterally with 10g monofilament b/l.  Assessment/Plan: 1. Pain due to onychomycosis of toenails of both feet   2. Callus   3. Controlled type 2 diabetes mellitus without complication, without long-term current use of insulin ([place])     No orders of the defined types were placed in this encounter.   -Consent given for treatment as described below: -Examined patient. -Continue supportive shoe gear daily. -Mycotic toenails 1-5 bilaterally were debrided in length and girth with sterile nail nippers and dremel without incident. -Callus(es) medial IPJ of left great toe pared utilizing sterile scalpel blade without complication or incident. Total number debrided =1. -Patient/POA to call should there be question/concern in the interim.   Return in about 3 months (around 04/01/2022).  Marzetta Board, DPM

## 2021-12-31 LAB — PTH-RELATED PEPTIDE: PTH-related peptide: <2 pmol/L

## 2022-01-04 ENCOUNTER — Other Ambulatory Visit (HOSPITAL_COMMUNITY): Payer: Self-pay

## 2022-01-04 ENCOUNTER — Inpatient Hospital Stay: Payer: BC Managed Care – PPO | Admitting: Hematology

## 2022-01-04 VITALS — BP 130/73 | HR 79 | Temp 97.0°F | Resp 20 | Ht 62.01 in | Wt 216.4 lb

## 2022-01-04 DIAGNOSIS — Z880 Allergy status to penicillin: Secondary | ICD-10-CM | POA: Diagnosis not present

## 2022-01-04 DIAGNOSIS — Z881 Allergy status to other antibiotic agents status: Secondary | ICD-10-CM | POA: Diagnosis not present

## 2022-01-04 DIAGNOSIS — Z803 Family history of malignant neoplasm of breast: Secondary | ICD-10-CM | POA: Diagnosis not present

## 2022-01-04 DIAGNOSIS — Z8249 Family history of ischemic heart disease and other diseases of the circulatory system: Secondary | ICD-10-CM | POA: Diagnosis not present

## 2022-01-04 DIAGNOSIS — C8118 Nodular sclerosis classical Hodgkin lymphoma, lymph nodes of multiple sites: Secondary | ICD-10-CM | POA: Diagnosis not present

## 2022-01-04 DIAGNOSIS — I110 Hypertensive heart disease with heart failure: Secondary | ICD-10-CM | POA: Diagnosis not present

## 2022-01-04 DIAGNOSIS — C8110 Nodular sclerosis classical Hodgkin lymphoma, unspecified site: Secondary | ICD-10-CM | POA: Diagnosis not present

## 2022-01-04 DIAGNOSIS — Z79899 Other long term (current) drug therapy: Secondary | ICD-10-CM | POA: Diagnosis not present

## 2022-01-04 DIAGNOSIS — Z820 Family history of epilepsy and other diseases of the nervous system: Secondary | ICD-10-CM | POA: Diagnosis not present

## 2022-01-04 DIAGNOSIS — Z86718 Personal history of other venous thrombosis and embolism: Secondary | ICD-10-CM | POA: Diagnosis not present

## 2022-01-04 NOTE — Progress Notes (Signed)
Center [address] [place] [address]   CLINIC:  Medical Oncology/Hematology  PCP:  Celene Squibb, MD [address] [place] [address]  [phone]  REASON FOR VISIT:  Follow-up for Hodgkin's lymphoma, nodular-sclerosing type  PRIOR THERAPY:  1.  ABVD from 09/13/2015 to 02/23/2016. 2. XRT to left axilla through 05/23/2016.  CURRENT THERAPY: Surveillance  INTERVAL HISTORY:  [place], a 66 y.o. female, seen for follow-up of Hodgkin's lymphoma.  Denies any fevers, night sweats or weight loss.  She reportedly had right hip replacement on Jul 07, 2021 and is able to walk better.  Denies any fevers or infections.  REVIEW OF SYSTEMS:  Review of Systems  Gastrointestinal:  Positive for constipation.  All other systems reviewed and are negative.   PAST MEDICAL/SURGICAL HISTORY:  Past Medical History:  Diagnosis Date   Acute deep vein thrombosis (DVT) of popliteal vein of left lower extremity (HCC) 02/09/2016   Arthritis    osteoarthritis   CHF (congestive heart failure) ([place])    EF NL   Diabetes mellitus without complication ([place])    History of radiation therapy 04/25/2016 - 05/23/2016   Left Axilla treated to 36 Gy in 20 fractions   [name], nodular sclerosis ([place]) 08/23/2015   Human immunodeficiency virus (HIV) disease ([place]) 02/23/2006   Qualifier: Diagnosis of  By: Megan Salon MD, John     Hypertension    Past Surgical History:  Procedure Laterality Date   AXILLARY LYMPH NODE BIOPSY Left 07/23/2015   Procedure: EXCISIONAL BIOPSY LEFT AXILLARY LYMPH NODE;  Surgeon: [name], MD;  Location: [facility];  Service: General;  Laterality: Left;   PORT-A-CATH REMOVAL N/A 01/09/2018   Procedure: MINOR REMOVAL PORT-A-CATH;  Surgeon: Aviva Signs, MD;  Location: [facility];  Service: General;  Laterality: N/A;   PORTACATH PLACEMENT Right 09/08/2015   Procedure: INSERTION OF CENTRAL VENOUS CATHETER WITH PORT FOR CHEMOTHERAPY;   Surgeon: [name], MD;  Location: [facility];  Service: General;  Laterality: Right;   SHOULDER SURGERY Right    removal of bone spur   TOTAL HIP ARTHROPLASTY Right 07/07/2021   Procedure: TOTAL HIP ARTHROPLASTY ANTERIOR APPROACH;  Surgeon: Rod Can, MD;  Location: [facility];  Service: Orthopedics;  Laterality: Right;  150   WISDOM TOOTH EXTRACTION      SOCIAL HISTORY:  Social History   Socioeconomic History   Marital status: Widowed    Spouse name: Not on file   Number of children: Not on file   Years of education: Not on file   Highest education level: Not on file  Occupational History   Not on file  Tobacco Use   Smoking status: Never   Smokeless tobacco: Never  Vaping Use   Vaping Use: Never used  Substance and Sexual Activity   Alcohol use: No   Drug use: No   Sexual activity: Never    Comment: declined condoms  Other Topics Concern   Not on file  Social History Narrative   Not on file   Social Determinants of Health   Financial Resource Strain: Not on file  Food Insecurity: Not on file  Transportation Needs: Not on file  Physical Activity: Not on file  Stress: Not on file  Social Connections: Not on file  Intimate Partner Violence: Not At Risk (06/17/2020)   Humiliation, Afraid, Rape, and Kick questionnaire    Fear of Current or Ex-Partner: No    Emotionally Abused:  No    Physically Abused: No    Sexually Abused: No    FAMILY HISTORY:  Family History  Problem Relation Age of Onset   Hypertension Mother    Cancer Mother 25       breast    Hypertension Father    Heart attack Father 44   Pulmonary embolism Sister    Parkinson's disease Brother     CURRENT MEDICATIONS:  Current Outpatient Medications  Medication Sig Dispense Refill   doxycycline (VIBRAMYCIN) 100 MG capsule Take 1 capsule(s) 2 TIMES A DAY by oral route for 14 days.     cholecalciferol (VITAMIN D) 1000 units tablet Take 1,000 Units by mouth daily.      dolutegravir (TIVICAY)  50 MG tablet TAKE 1 TABLET DAILY. TAKE WITH ODEFSEY. 30 tablet 11   emtricitabine-rilpivir-tenofovir AF (ODEFSEY) 200-25-25 MG TABS tablet TAKE 1 TABLET DAILY WITH BREAKFAST. TAKE WITH TIVICAY. 30 tablet 11   fenofibrate 160 MG tablet Take 1 tablet by mouth daily.     furosemide (LASIX) 20 MG tablet Take 1 tablet (20 mg total) by mouth daily. 90 tablet 2   glucosamine-chondroitin 500-400 MG tablet      ibuprofen (ADVIL) 800 MG tablet Take 1 tablet by mouth 3 (three) times daily as needed.     JARDIANCE 25 MG TABS tablet Take 25 mg by mouth daily.      metFORMIN (GLUCOPHAGE-XR) 500 MG 24 hr tablet Take 500 mg by mouth 2 (two) times daily.     metoprolol tartrate (LOPRESSOR) 25 MG tablet Take 1 tablet (25 mg total) by mouth daily. 30 tablet 0   Multiple Vitamin (MULTIVITAMIN WITH MINERALS) TABS tablet Take 1 tablet by mouth daily.     OZEMPIC, 1 MG/DOSE, 4 MG/3ML SOPN Inject 1 mg into the skin every Friday.     REPATHA 140 MG/ML SOSY Inject 140 mg into the muscle every 14 (fourteen) days.     spironolactone (ALDACTONE) 25 MG tablet TAKE 1 TABLET DAILY (Patient taking differently: Take 25 mg by mouth daily.) 90 tablet 3   vitamin E 400 UNIT capsule Take 400 Units by mouth daily.     No current facility-administered medications for this visit.    ALLERGIES:  Allergies  Allergen Reactions   Augmentin [Amoxicillin-Pot Clavulanate] Hives and Other (See Comments)    Has patient had a PCN reaction causing immediate rash, facial/tongue/throat swelling, SOB or lightheadedness with hypotension: No Has patient had a PCN reaction causing severe rash involving mucus membranes or skin necrosis: No Has patient had a PCN reaction that required hospitalization No Has patient had a PCN reaction occurring within the last 10 years: No If all of the above answers are "NO", then may proceed with Cephalosporin use.   Bleomycin Cough   Cefpodoxime Proxetil Itching and Rash    Pt unsure of allergy    Doxycycline  Rash    PHYSICAL EXAM:  Performance status (ECOG): 1 - Symptomatic but completely ambulatory  Vitals:   01/04/22 1415  BP: 130/73  Pulse: 79  Resp: 20  Temp: (!) 97 F (36.1 C)  SpO2: 98%   Wt Readings from Last 3 Encounters:  01/04/22 216 lb 6.4 oz (98.2 kg)  12/22/21 215 lb (97.5 kg)  07/01/21 202 lb (91.6 kg)   Physical Exam Vitals reviewed.  Constitutional:      Appearance: Normal appearance. She is obese.  Cardiovascular:     Rate and Rhythm: Normal rate and regular rhythm.     Pulses:  Normal pulses.     Heart sounds: Normal heart sounds.  Pulmonary:     Effort: Pulmonary effort is normal.     Breath sounds: Normal breath sounds.  Musculoskeletal:     Right lower leg: No edema.     Left lower leg: No edema.  Lymphadenopathy:     Cervical: No cervical adenopathy.     Upper Body:     Right upper body: No supraclavicular, axillary or pectoral adenopathy.     Left upper body: No supraclavicular, axillary or pectoral adenopathy.  Neurological:     General: No focal deficit present.     Mental Status: She is alert and oriented to person, place, and time.  Psychiatric:        Mood and Affect: Mood normal.        Behavior: Behavior normal.    LABORATORY DATA:  I have reviewed the labs as listed.     Latest Ref Rng & Units 12/27/2021   11:04 AM 06/24/2021    8:41 AM 05/26/2021    8:48 AM  CBC  WBC 4.0 - 10.5 K/uL 7.5  7.8  7.6   Hemoglobin 12.0 - 15.0 g/dL 16.2  16.6  16.9   Hematocrit 36.0 - 46.0 % 49.0  51.4  50.2   Platelets 150 - 400 K/uL 269  245  268       Latest Ref Rng & Units 12/27/2021   11:04 AM 06/24/2021    8:41 AM 05/26/2021    8:48 AM  CMP  Glucose 70 - 99 mg/dL 110  108  92   BUN 8 - 23 mg/dL '22  20  17   '$ Creatinine 0.44 - 1.00 mg/dL 0.54  0.53  0.62   Sodium 135 - 145 mmol/L 140  141  140   Potassium 3.5 - 5.1 mmol/L 4.1  4.4  4.7   Chloride 98 - 111 mmol/L 102  104  101   CO2 22 - 32 mmol/L '28  28  27   '$ Calcium 8.9 - 10.3 mg/dL 8.7 - 10.3  mg/dL 9.9    10.3  10.2    10.5  10.4   Total Protein 6.5 - 8.1 g/dL 7.2  7.9  7.7   Total Bilirubin 0.3 - 1.2 mg/dL 0.8  0.5  0.4   Alkaline Phos 38 - 126 U/L 40  44    AST 15 - 41 U/L '28  19  22   '$ ALT 0 - 44 U/L '29  21  19       '$ Component Value Date/Time   RBC 5.28 (H) 12/27/2021 1104   MCV 92.8 12/27/2021 1104   MCH 30.7 12/27/2021 1104   MCHC 33.1 12/27/2021 1104   RDW 13.9 12/27/2021 1104   LYMPHSABS 2.9 12/27/2021 1104   MONOABS 0.6 12/27/2021 1104   EOSABS 0.2 12/27/2021 1104   BASOSABS 0.1 12/27/2021 1104   Lab Results  Component Value Date   LDH 117 12/27/2021   LDH 112 06/24/2021   LDH 135 12/23/2020   Lab Results  Component Value Date   VD25OH 31.03 12/27/2021   VD25OH 47.47 06/24/2021   VD25OH 32.93 12/23/2020    DIAGNOSTIC IMAGING:  I have independently reviewed the scans and discussed with the patient. MM 3D SCREEN BREAST BILATERAL  Result Date: 12/15/2021 CLINICAL DATA:  Screening. EXAM: DIGITAL SCREENING BILATERAL MAMMOGRAM WITH TOMOSYNTHESIS AND CAD TECHNIQUE: Bilateral screening digital craniocaudal and mediolateral oblique mammograms were obtained. Bilateral screening digital breast tomosynthesis was performed. The  images were evaluated with computer-aided detection. COMPARISON:  Previous exam(s). ACR Breast Density Category b: There are scattered areas of fibroglandular density. FINDINGS: There are no findings suspicious for malignancy. IMPRESSION: No mammographic evidence of malignancy. A result letter of this screening mammogram will be mailed directly to the patient. RECOMMENDATION: Screening mammogram in one year. (Code:SM-B-01Y) BI-RADS CATEGORY  1: Negative. Electronically Signed   By: Lajean Manes M.D.   On: 12/15/2021 14:06      ASSESSMENT:  1.  Hodgkin's Lymphoma, nodular-sclerosing type involving multiple lymph nodes: -Left axilla pathology : CD30, CD15, and PAX-5 positive. -ABVD chemotherapy beginning on 09/13/2015, with discontinuation of  Bleomycin on 10/11/2015. -Mid-course PET scan revealed Deauville 3 lymphadenopathy, so she received 4 additional cycles of AVD. -Treatment course was complicated by diastolic heart failure. -Chemotherapy completed on 02/23/2016. -Post-treatment PET revealed mild residual Left axillary adenopathy with a Deauville 2, so she was referred for axillary radiation therapy. -Completed XRT to left axilla on 05/23/16. -repeat PET on 07/2016 showed mild residual low level metabolic activity within left axillary lymph nodes a similar to prior deauville 2.   2.  HIV: -She continues to follow up with Infectious disease.   3.  Chemo induced heart failure: -She continues to follow up with cardiology.    PLAN:  1.  Hodgkin's Lymphoma, nodular-sclerosing type involving multiple lymph nodes: - She does not have any B symptoms or infections. - No lymphadenopathy on physical exam. - Reviewed labs which showed normal LDH and LFTs.  CBC was grossly normal. - Recommend follow-up in 1 year with repeat labs.  2.  Mild hypercalcemia: - She had slightly high calcium in the past. - We checked her vitamin D level which was normal.  Myeloma panel was negative.  Intact PTH was 15.  Calcium now has normalized.  No further work-up needed.  3.  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 - We have discussed JAK2 V617F testing which was negative.  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  She is also on diuretics.  No further work-up needed at this time.     Orders placed this encounter:  No orders of the defined types were placed in this encounter.    Derek Jack, MD [facility] [phone]

## 2022-01-04 NOTE — Patient Instructions (Signed)
[facility]  Discharge Instructions  You were seen and examined today by Dr. Delton Coombes.  Dr. Delton Coombes discussed your most recent lab work which revealed that everything looks good except your hemoglobin is high.   Continue taking your over the counter Vitamin D.  Follow-up as scheduled in 1 year with labs.     Thank you for choosing [facility] to provide your oncology and hematology care.   To afford each patient quality time with our provider, please arrive at least 15 minutes before your scheduled appointment time. You may need to reschedule your appointment if you arrive late (10 or more minutes). Arriving late affects you and other patients whose appointments are after yours.  Also, if you miss three or more appointments without notifying the office, you may be dismissed from the clinic at the provider's discretion.    Again, thank you for choosing [facility].  Our hope is that these requests will decrease the amount of time that you wait before being seen by our physicians.   If you have a lab appointment with the [facility] please come in thru the Main Entrance and check in at the main information desk.           _____________________________________________________________  Should you have questions after your visit to [facility], please contact our office at [phone] and follow the prompts.  Our office hours are 8:00 a.m. to 4:30 p.m. Monday - Thursday and 8:00 a.m. to 2:30 p.m. Friday.  Please note that voicemails left after 4:00 p.m. may not be returned until the following business day.  We are closed weekends and all major holidays.  You do have access to a nurse 24-7, just call the main number to the clinic [phone] and do not press any options, hold on the line and a nurse will answer the phone.    For prescription refill requests, have your pharmacy contact our office and allow  72 hours.    Masks are optional in the cancer centers. If you would like for your care team to wear a mask while they are taking care of you, please let them know. You may have one support person who is at least 66 years old accompany you for your appointments.

## 2022-01-05 LAB — VITAMIN D 1,25 DIHYDROXY
Vitamin D 1, 25 (OH)2 Total: 56 pg/mL
Vitamin D2 1, 25 (OH)2: <10 pg/mL
Vitamin D3 1, 25 (OH)2: 54 pg/mL

## 2022-01-09 ENCOUNTER — Other Ambulatory Visit: Payer: Self-pay | Admitting: Pharmacist

## 2022-01-09 ENCOUNTER — Telehealth: Payer: Self-pay | Admitting: Pharmacist

## 2022-01-09 DIAGNOSIS — B20 Human immunodeficiency virus [HIV] disease: Secondary | ICD-10-CM

## 2022-01-09 MED ORDER — BIKTARVY 50-200-25 MG PO TABS: 1.0000 | ORAL_TABLET | Freq: Every day | ORAL | 2 refills | Status: DC

## 2022-01-09 NOTE — Telephone Encounter (Signed)
Called patient on behalf of Michel Bickers, MD regarding consolidation of ART regimen from [place] to [place]. This consolidation was intended to reduce pill burden and cost with patient's anticipated switch to Medicare. Careful consideration was made in regards to the patient's cumulative HIV genotype. Counseled the patient to separate out the medication from her multivitamin with minerals and the potential for mild, generalized side effects of headache, fatigue and stomach upset in the first 1-2 weeks after starting the medication. She was very concerned about the cost of the [place] as her current regimen is completely covered. I advised her to contact the [facility] clinic if there are issues with coverage or issues obtaining the medication. A follow-up appointment with Dr. Megan Salon was scheduled for 02/09/22 to assess viral load after change in therapy.   Adria Dill, PharmD PGY-2 Infectious Diseases Resident  01/09/2022 2:31 PM

## 2022-01-17 DIAGNOSIS — Z96641 Presence of right artificial hip joint: Secondary | ICD-10-CM | POA: Diagnosis not present

## 2022-02-09 ENCOUNTER — Other Ambulatory Visit: Payer: Self-pay

## 2022-02-09 ENCOUNTER — Encounter: Payer: Self-pay | Admitting: Internal Medicine

## 2022-02-09 ENCOUNTER — Ambulatory Visit (INDEPENDENT_AMBULATORY_CARE_PROVIDER_SITE_OTHER): Payer: Medicare Other | Admitting: Internal Medicine

## 2022-02-09 VITALS — BP 135/76 | HR 84 | Temp 98.2°F | Resp 16 | Ht 61.0 in | Wt 226.0 lb

## 2022-02-09 DIAGNOSIS — B20 Human immunodeficiency virus [HIV] disease: Secondary | ICD-10-CM | POA: Diagnosis present

## 2022-02-09 NOTE — Progress Notes (Signed)
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[place]) 10/27/2015   Acute congestive heart failure ([place]) 10/27/2015   Hodgkin's disease, nodular sclerosis, of lymph nodes of multiple sites ([place]) 08/23/2015   Visual floaters 09/12/2011   Episodic low back pain 11/15/2010   ROTATOR CUFF INJURY, RIGHT SHOULDER 11/03/2008   ADHESIVE CAPSULITIS OF SHOULDER 11/12/2007   OBESITY NOS 05/17/2006   SYNDROME, CARPAL TUNNEL 05/17/2006   FOOT PAIN 05/17/2006   Hyperlipidemia 02/23/2006   DEPRESSION 02/23/2006   Essential hypertension 02/23/2006    Patient's Medications  New Prescriptions   No medications on file  Previous Medications   BICTEGRAVIR-EMTRICITABINE-TENOFOVIR AF (BIKTARVY) 50-200-25 MG TABS TABLET    Take 1 tablet by mouth daily.   CHOLECALCIFEROL (VITAMIN D) 1000 UNITS TABLET    Take 1,000 Units by mouth daily.    DOXYCYCLINE (VIBRAMYCIN) 100 MG CAPSULE    Take 1 capsule(s) 2 TIMES A DAY by oral route for 14 days.   FENOFIBRATE 160 MG TABLET    Take 1 tablet by mouth daily.   FUROSEMIDE (LASIX) 20 MG TABLET    Take 1 tablet (20 mg total) by mouth daily.   GLUCOSAMINE-CHONDROITIN 500-400 MG TABLET       IBUPROFEN (ADVIL) 800 MG TABLET    Take 1 tablet by mouth 3 (three) times daily as needed.   JARDIANCE 25 MG TABS TABLET    Take 25 mg by mouth daily.    METFORMIN (GLUCOPHAGE-XR) 500 MG 24 HR TABLET    Take 500 mg by mouth 2 (two) times daily.   METOPROLOL TARTRATE (LOPRESSOR) 25 MG TABLET    Take 1 tablet (25 mg total) by  mouth daily.   MULTIPLE VITAMIN (MULTIVITAMIN WITH MINERALS) TABS TABLET    Take 1 tablet by mouth daily.   OZEMPIC, 1 MG/DOSE, 4 MG/3ML SOPN    Inject 1 mg into the skin every Friday.   REPATHA 140 MG/ML SOSY    Inject 140 mg into the muscle every 14 (fourteen) days.   SPIRONOLACTONE (ALDACTONE) 25 MG TABLET    TAKE 1 TABLET DAILY   VITAMIN E 400 UNIT CAPSULE    Take 400 Units by mouth daily.  Modified Medications   No medications on file  Discontinued Medications   No medications on file    Subjective: [place] is in for her routine HIV follow-up visit.  She recently enrolled in Medicare and has health team advantage Medicare advantage plan.  She has a co-pay card and was able to consolidate her antiretroviral therapy from [facility] and [place] to [place].  She had no co-pay.  She started it 1 week ago and has had no problems tolerating it.  She still waiting to get her first Social Security check so she does not know what her total monthly income will be.  She is not sure if she will be able to cover the co-pay for all of her other medications.  Review of Systems: Review of Systems  Constitutional:  Positive for weight loss. Negative for  fever.    Past Medical History:  Diagnosis Date   Acute deep vein thrombosis (DVT) of popliteal vein of left lower extremity ([place]) 02/09/2016   Arthritis    osteoarthritis   CHF (congestive heart failure) (HCC)    EF NL   Diabetes mellitus without complication ([place])    History of radiation therapy 04/25/2016 - 05/23/2016   Left Axilla treated to 36 Gy in 20 fractions   [name], nodular sclerosis ([place]) 08/23/2015   Human immunodeficiency virus (HIV) disease ([place]) 02/23/2006   Qualifier: Diagnosis of  By: Megan Salon MD, Carletta Feasel     Hypertension     Social History   Tobacco Use   Smoking status: Never   Smokeless tobacco: Never  Vaping Use   Vaping Use: Never used  Substance Use Topics   Alcohol use: No   Drug use: No    Family  History  Problem Relation Age of Onset   Hypertension Mother    Cancer Mother 44       breast    Hypertension Father    Heart attack Father 38   Pulmonary embolism Sister    Parkinson's disease Brother     Allergies  Allergen Reactions   Augmentin [Amoxicillin-Pot Clavulanate] Hives and Other (See Comments)    Has patient had a PCN reaction causing immediate rash, facial/tongue/throat swelling, SOB or lightheadedness with hypotension: No Has patient had a PCN reaction causing severe rash involving mucus membranes or skin necrosis: No Has patient had a PCN reaction that required hospitalization No Has patient had a PCN reaction occurring within the last 10 years: No If all of the above answers are "NO", then may proceed with Cephalosporin use.   Bleomycin Cough   Cefpodoxime Proxetil Itching and Rash    Pt unsure of allergy    Doxycycline Rash    Health Maintenance  Topic Date Due   Medicare Annual Wellness (AWV)  Never done   OPHTHALMOLOGY EXAM  Never done   Diabetic kidney evaluation - Urine ACR  Never done   Zoster Vaccines- Shingrix (1 of 2) Never done   COLONOSCOPY (Pts 45-63yr Insurance coverage will need to be confirmed)  Never done   Pneumonia Vaccine 66 Years old (3 - PCV) 11/15/2011   COVID-19 Vaccine (3 - Moderna risk series) 07/04/2019   FOOT EXAM  01/03/2022   HEMOGLOBIN A1C  01/07/2022   DTaP/Tdap/Td (2 - Td or Tdap) 11/06/2022   Diabetic kidney evaluation - eGFR measurement  12/28/2022   MAMMOGRAM  12/15/2023   DEXA SCAN  Completed   Hepatitis C Screening  Completed   HPV VACCINES  Aged Out    Objective:  Vitals:   02/09/22 0944  BP: 135/76  Pulse: 84  Resp: 16  Temp: 98.2 F (36.8 C)  TempSrc: Oral  SpO2: 100%  Weight: 226 lb (102.5 kg)  Height: _0  (1.549 m)   Body mass index is 42.7 kg/m.  Physical Exam Constitutional:      Comments: She is in very good spirits.  Cardiovascular:     Rate and Rhythm: Normal rate.  Pulmonary:      Effort: Pulmonary effort is normal.  Psychiatric:        Mood and Affect: Mood normal.     Lab Results Lab Results  Component Value Date   WBC 7.5 12/27/2021   HGB 16.2 (H) 12/27/2021   HCT 49.0 (H) 12/27/2021   MCV 92.8 12/27/2021   PLT 269 12/27/2021    Lab Results  Component Value Date   CREATININE 0.54 12/27/2021   BUN 22 12/27/2021   NA 140 12/27/2021   K 4.1 12/27/2021   CL 102 12/27/2021   CO2 28 12/27/2021    Lab Results  Component Value Date   ALT 29 12/27/2021   AST 28 12/27/2021   ALKPHOS 40 12/27/2021   BILITOT 0.8 12/27/2021    Lab Results  Component Value Date   CHOL 115 05/26/2021   HDL 53 05/26/2021   LDLCALC 38 05/26/2021   TRIG 160 (H) 05/26/2021   CHOLHDL 2.2 05/26/2021   Lab Results  Component Value Date   LABRPR NON-REACTIVE 05/26/2021   HIV 1 RNA Quant  Date Value  05/26/2021 NOT DETECTED copies/mL  05/26/2020 Not Detected Copies/mL  09/04/2019 55 copies/mL (H)   CD4 T Cell Abs (/uL)  Date Value  05/26/2021 662  05/26/2020 850  09/04/2019 984     Problem List Items Addressed This Visit       High   Human immunodeficiency virus (HIV) disease ([place]) - Primary    She is off to a good start with Biktarvy.  I am confident that it will be able to keep her infection under excellent long-term control.  She will follow-up in early April for repeat blood work.         Michel Bickers, MD [facility] [phone] pager   [phone] cell 02/09/2022, 10:06 AM

## 2022-02-09 NOTE — Assessment & Plan Note (Signed)
She is off to a good start with Biktarvy.  I am confident that it will be able to keep her infection under excellent long-term control.  She will follow-up in early April for repeat blood work.

## 2022-03-22 ENCOUNTER — Other Ambulatory Visit: Payer: Self-pay | Admitting: Internal Medicine

## 2022-03-22 DIAGNOSIS — B20 Human immunodeficiency virus [HIV] disease: Secondary | ICD-10-CM

## 2022-04-21 ENCOUNTER — Encounter: Payer: Self-pay | Admitting: Podiatry

## 2022-04-21 ENCOUNTER — Ambulatory Visit (INDEPENDENT_AMBULATORY_CARE_PROVIDER_SITE_OTHER): Payer: Medicare Other | Admitting: Podiatry

## 2022-04-21 DIAGNOSIS — M79675 Pain in left toe(s): Secondary | ICD-10-CM

## 2022-04-21 DIAGNOSIS — M2141 Flat foot [pes planus] (acquired), right foot: Secondary | ICD-10-CM

## 2022-04-21 DIAGNOSIS — B351 Tinea unguium: Secondary | ICD-10-CM | POA: Diagnosis not present

## 2022-04-21 DIAGNOSIS — M2142 Flat foot [pes planus] (acquired), left foot: Secondary | ICD-10-CM | POA: Diagnosis not present

## 2022-04-21 DIAGNOSIS — M79674 Pain in right toe(s): Secondary | ICD-10-CM

## 2022-04-21 DIAGNOSIS — E119 Type 2 diabetes mellitus without complications: Secondary | ICD-10-CM | POA: Diagnosis not present

## 2022-04-21 NOTE — Progress Notes (Signed)
ANNUAL DIABETIC FOOT EXAM  Subjective: Holly Stephens presents today for annual diabetic foot examination.  Chief Complaint  Patient presents with   Nail Problem    DFC BS-190 A1C-6.9 PCP-[facility] PCP VST-02/2022   Patient confirms h/o diabetes.  Patient relates diagnosis was in the early 1990's.  Patient denies any h/o foot wounds.   Patient endorses symptoms of foot tingling and numbness  Patient endorses symptoms of burning in feet.  Risk factors: diabetes, h/o DVT, HTN, CHF, hyperlipidemia, HIV.  Celene Squibb, MD is patient's PCP.   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   Human immunodeficiency virus (HIV) disease ([place]) 02/23/2006   Qualifier: Diagnosis of  By: Megan Salon MD, John     Hypertension    Patient Active Problem List   Diagnosis Date Noted   Osteoarthritis of right hip 05/24/2021   Leukocytosis 01/21/2021   Nasal congestion 01/21/2021   Spasm of back muscles 07/16/2020   Insomnia 07/15/2020   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   Osteoarthritis 07/15/2020   Secondary polycythemia 07/15/2020   Type 2 diabetes mellitus ([place]) 07/15/2020   Routine screening for STI (sexually transmitted infection) 11/11/2019   Screening for cervical cancer 11/11/2019   Pain in left knee 02/19/2018   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[place]) 04/05/2016   Acute deep vein thrombosis (DVT) of popliteal vein of left lower extremity ([place]) 02/09/2016   Congestive heart failure with cardiomyopathy ([place]) 10/27/2015   Acute congestive heart failure ([place])  10/27/2015   Hodgkin's disease, nodular sclerosis, of lymph nodes of multiple sites ([place]) 08/23/2015   Visual floaters 09/12/2011   Episodic low back pain 11/15/2010   ROTATOR CUFF INJURY, RIGHT SHOULDER 11/03/2008   ADHESIVE CAPSULITIS OF SHOULDER 11/12/2007   OBESITY NOS 05/17/2006   SYNDROME, CARPAL TUNNEL 05/17/2006   FOOT PAIN 05/17/2006   Human immunodeficiency virus (HIV) disease ([place]) 02/23/2006   Hyperlipidemia 02/23/2006   DEPRESSION 02/23/2006   Essential hypertension 02/23/2006   Past Surgical History:  Procedure Laterality Date   AXILLARY LYMPH NODE BIOPSY Left 07/23/2015   Procedure: EXCISIONAL BIOPSY LEFT AXILLARY LYMPH NODE;  Surgeon: [name], MD;  Location: [facility];  Service: General;  Laterality: Left;   PORT-A-CATH REMOVAL N/A 01/09/2018   Procedure: MINOR REMOVAL PORT-A-CATH;  Surgeon: Aviva Signs, MD;  Location: [facility];  Service: General;  Laterality: N/A;   PORTACATH PLACEMENT Right 09/08/2015   Procedure: INSERTION OF CENTRAL VENOUS CATHETER WITH PORT FOR CHEMOTHERAPY;  Surgeon: [name], MD;  Location: [facility];  Service: General;  Laterality: Right;   SHOULDER SURGERY Right    removal of bone spur   TOTAL HIP ARTHROPLASTY Right 07/07/2021   Procedure: TOTAL HIP ARTHROPLASTY ANTERIOR APPROACH;  Surgeon: Rod Can, MD;  Location: [facility];  Service: Orthopedics;  Laterality: Right;  150   WISDOM TOOTH EXTRACTION     Current Outpatient Medications on File Prior to Visit  Medication Sig Dispense Refill   BIKTARVY 50-200-25 MG TABS tablet TAKE 1 TABLET BY MOUTH 1 TIME A DAY. 30 tablet 2  cholecalciferol (VITAMIN D) 1000 units tablet Take 1,000 Units by mouth daily.      doxycycline (VIBRAMYCIN) 100 MG capsule Take 1 capsule(s) 2 TIMES A DAY by oral route for 14 days. (Patient not taking: Reported on 02/09/2022)     fenofibrate 160 MG tablet Take 1 tablet by mouth daily.     furosemide (LASIX) 20 MG tablet Take 1 tablet (20 mg total) by mouth  daily. 90 tablet 2   glucosamine-chondroitin 500-400 MG tablet      ibuprofen (ADVIL) 800 MG tablet Take 1 tablet by mouth 3 (three) times daily as needed.     JARDIANCE 25 MG TABS tablet Take 25 mg by mouth daily.      metFORMIN (GLUCOPHAGE-XR) 500 MG 24 hr tablet Take 500 mg by mouth 2 (two) times daily.     metoprolol tartrate (LOPRESSOR) 25 MG tablet Take 1 tablet (25 mg total) by mouth daily. 30 tablet 0   Multiple Vitamin (MULTIVITAMIN WITH MINERALS) TABS tablet Take 1 tablet by mouth daily.     OZEMPIC, 1 MG/DOSE, 4 MG/3ML SOPN Inject 1 mg into the skin every Friday.     REPATHA 140 MG/ML SOSY Inject 140 mg into the muscle every 14 (fourteen) days.     spironolactone (ALDACTONE) 25 MG tablet TAKE 1 TABLET DAILY (Patient taking differently: Take 25 mg by mouth daily.) 90 tablet 3   vitamin E 400 UNIT capsule Take 400 Units by mouth daily.     No current facility-administered medications on file prior to visit.    Allergies  Allergen Reactions   Augmentin [Amoxicillin-Pot Clavulanate] Hives and Other (See Comments)    Has patient had a PCN reaction causing immediate rash, facial/tongue/throat swelling, SOB or lightheadedness with hypotension: No Has patient had a PCN reaction causing severe rash involving mucus membranes or skin necrosis: No Has patient had a PCN reaction that required hospitalization No Has patient had a PCN reaction occurring within the last 10 years: No If all of the above answers are "NO", then may proceed with Cephalosporin use.   Bleomycin Cough   Cefpodoxime Proxetil Itching and Rash    Pt unsure of allergy    Doxycycline Rash   Social History   Occupational History   Not on file  Tobacco Use   Smoking status: Never   Smokeless tobacco: Never  Vaping Use   Vaping Use: Never used  Substance and Sexual Activity   Alcohol use: No   Drug use: No   Sexual activity: Never    Comment: declined condoms   Family History  Problem Relation Age of Onset    Hypertension Mother    Cancer Mother 107       breast    Hypertension Father    Heart attack Father 31   Pulmonary embolism Sister    Parkinson's disease Brother    Immunization History  Administered Date(s) Administered   H1N1 03/23/2008   Hepatitis B 04/02/2001, 05/17/2001, 09/17/2001   Influenza Split 11/15/2010, 12/05/2011   Influenza Whole 11/15/2005, 11/16/2005, 11/20/2006, 11/12/2007, 12/03/2008, 11/29/2009   Influenza,inj,Quad PF,6+ Mos 11/05/2012, 12/01/2015   Influenza-Unspecified 11/19/2013, 11/09/2016, 12/04/2017   Moderna Sars-Covid-2 Vaccination 05/08/2019, 06/06/2019   Pneumococcal Polysaccharide-23 11/16/2005, 11/15/2010   Tdap 11/05/2012     Review of Systems: Negative except as noted in the HPI.   Objective: There were no vitals filed for this visit.  DELAYNIE COSSIO is a pleasant 67 y.o. female in NAD. AAO X 3.  Vascular Examination: Capillary refill  time to digits immediate b/l. Palpable pedal pulses b/l LE. Pedal hair sparse. No pain with calf compression b/l. Lower extremity skin temperature gradient within normal limits. No edema noted b/l LE. No cyanosis or clubbing noted b/l LE.  Dermatological Examination: Pedal skin is warm and supple b/l LE. No open wounds b/l LE. No interdigital macerations noted b/l LE. Toenail(s) 1-5 bilaterally elongated, discolored, dystrophic, thickened >1/4 inch. Nails are crumbly with subungual debris and there is exquisite tenderness to dorsal palpation. No subungual wound(s) noted.  Neurological Examination: Pt has subjective symptoms of neuropathy. Protective sensation intact 5/5 intact bilaterally with 10g monofilament b/l. Vibratory sensation intact b/l.  Musculoskeletal Examination: Muscle strength 5/5 to all lower extremity muscle groups bilaterally. No pain, crepitus or joint limitation noted with ROM bilateral LE. Pes planus deformity noted bilateral LE. Patient ambulates independent of any assistive aids.  Footwear  Assessment: Does the patient wear appropriate shoes? Yes. Does the patient need inserts/orthotics? No.  Lab Results  Component Value Date   HGBA1C 5.5 07/07/2021   ADA Risk Categorization: Low Risk :  Patient has all of the following: Intact protective sensation No prior foot ulcer  No severe deformity Pedal pulses present  Assessment: 1. Pain due to onychomycosis of toenails of both feet   2. Pes planus of both feet   3. Controlled type 2 diabetes mellitus without complication, without long-term current use of insulin ([place])   4. Encounter for diabetic foot exam [facility])     Plan: -Patient was evaluated and treated. All patient's and/or POA's questions/concerns answered on today's visit. -Diabetic foot examination performed today. -Continue diabetic foot care principles: inspect feet daily, monitor glucose as recommended by PCP and/or Endocrinologist, and follow prescribed diet per PCP, Endocrinologist and/or dietician. -Patient to continue soft, supportive shoe gear daily. -Toenails 1-5 b/l were debrided in length and girth with sterile nail nippers and dremel without iatrogenic bleeding.  -Patient/POA to call should there be question/concern in the interim. Return in about 3 months (around 07/22/2022).  Marzetta Board, DPM

## 2022-05-10 ENCOUNTER — Other Ambulatory Visit: Payer: Self-pay

## 2022-05-10 DIAGNOSIS — Z113 Encounter for screening for infections with a predominantly sexual mode of transmission: Secondary | ICD-10-CM

## 2022-05-10 DIAGNOSIS — B20 Human immunodeficiency virus [HIV] disease: Secondary | ICD-10-CM

## 2022-05-10 DIAGNOSIS — Z79899 Other long term (current) drug therapy: Secondary | ICD-10-CM

## 2022-05-11 ENCOUNTER — Other Ambulatory Visit: Payer: Self-pay

## 2022-05-11 ENCOUNTER — Other Ambulatory Visit: Payer: Medicare Other

## 2022-05-11 DIAGNOSIS — Z79899 Other long term (current) drug therapy: Secondary | ICD-10-CM

## 2022-05-11 DIAGNOSIS — B20 Human immunodeficiency virus [HIV] disease: Secondary | ICD-10-CM

## 2022-05-11 DIAGNOSIS — Z113 Encounter for screening for infections with a predominantly sexual mode of transmission: Secondary | ICD-10-CM

## 2022-05-12 LAB — T-HELPER CELL (CD4) - (RCID CLINIC ONLY)
CD4 % Helper T Cell: 41 % (ref 33–65)
CD4 T Cell Abs: 849 /uL (ref 400–1790)

## 2022-05-13 LAB — COMPLETE METABOLIC PANEL WITH GFR
AG Ratio: 1.7 (calc) (ref 1.0–2.5)
ALT: 34 U/L — ABNORMAL HIGH (ref 6–29)
AST: 24 U/L (ref 10–35)
Albumin: 4.6 g/dL (ref 3.6–5.1)
Alkaline phosphatase (APISO): 45 U/L (ref 37–153)
BUN: 14 mg/dL (ref 7–25)
CO2: 28 mmol/L (ref 20–32)
Calcium: 10.5 mg/dL — ABNORMAL HIGH (ref 8.6–10.4)
Chloride: 103 mmol/L (ref 98–110)
Creat: 0.55 mg/dL (ref 0.50–1.05)
Globulin: 2.7 g/dL (calc) (ref 1.9–3.7)
Glucose, Bld: 147 mg/dL — ABNORMAL HIGH (ref 65–99)
Potassium: 4.7 mmol/L (ref 3.5–5.3)
Sodium: 143 mmol/L (ref 135–146)
Total Bilirubin: 0.4 mg/dL (ref 0.2–1.2)
Total Protein: 7.3 g/dL (ref 6.1–8.1)
eGFR: 101 mL/min/{1.73_m2} (ref >=60)

## 2022-05-13 LAB — CBC WITH DIFFERENTIAL/PLATELET
Absolute Monocytes: 455 cells/uL (ref 200–950)
Basophils Absolute: 52 cells/uL (ref 0–200)
Basophils Relative: 0.8 %
Eosinophils Absolute: 163 cells/uL (ref 15–500)
Eosinophils Relative: 2.5 %
HCT: 51.5 % — ABNORMAL HIGH (ref 35.0–45.0)
Hemoglobin: 17.6 g/dL — ABNORMAL HIGH (ref 11.7–15.5)
Lymphs Abs: 2366 cells/uL (ref 850–3900)
MCH: 31 pg (ref 27.0–33.0)
MCHC: 34.2 g/dL (ref 32.0–36.0)
MCV: 90.8 fL (ref 80.0–100.0)
MPV: 10.1 fL (ref 7.5–12.5)
Monocytes Relative: 7 %
Neutro Abs: 3465 cells/uL (ref 1500–7800)
Neutrophils Relative %: 53.3 %
Platelets: 277 10*3/uL (ref 140–400)
RBC: 5.67 10*6/uL — ABNORMAL HIGH (ref 3.80–5.10)
RDW: 12.8 % (ref 11.0–15.0)
Total Lymphocyte: 36.4 %
WBC: 6.5 10*3/uL (ref 3.8–10.8)

## 2022-05-13 LAB — LIPID PANEL
Cholesterol: 195 mg/dL (ref <200)
HDL: 47 mg/dL — ABNORMAL LOW (ref >=50)
LDL Cholesterol (Calc): 107 mg/dL (calc) — ABNORMAL HIGH
Non-HDL Cholesterol (Calc): 148 mg/dL (calc) — ABNORMAL HIGH (ref <130)
Total CHOL/HDL Ratio: 4.1 (calc) (ref <5.0)
Triglycerides: 289 mg/dL — ABNORMAL HIGH (ref <150)

## 2022-05-13 LAB — HIV-1 RNA QUANT-NO REFLEX-BLD
HIV 1 RNA Quant: NOT DETECTED Copies/mL
HIV-1 RNA Quant, Log: NOT DETECTED Log cps/mL

## 2022-05-13 LAB — RPR: RPR Ser Ql: NONREACTIVE

## 2022-05-25 ENCOUNTER — Other Ambulatory Visit: Payer: Self-pay

## 2022-05-25 ENCOUNTER — Encounter: Payer: Self-pay | Admitting: Internal Medicine

## 2022-05-25 ENCOUNTER — Ambulatory Visit (INDEPENDENT_AMBULATORY_CARE_PROVIDER_SITE_OTHER): Payer: Medicare Other | Admitting: Internal Medicine

## 2022-05-25 VITALS — BP 142/82 | HR 92 | Temp 98.0°F | Ht 61.0 in | Wt 236.0 lb

## 2022-05-25 DIAGNOSIS — B20 Human immunodeficiency virus [HIV] disease: Secondary | ICD-10-CM

## 2022-05-25 MED ORDER — BIKTARVY 50-200-25 MG PO TABS: ORAL_TABLET | ORAL | 11 refills | Status: DC

## 2022-05-25 NOTE — Progress Notes (Signed)
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 04/05/2016   Acute deep vein thrombosis (DVT) of popliteal vein of left lower extremity 02/09/2016   Congestive heart failure with cardiomyopathy 10/27/2015   Acute congestive heart failure 10/27/2015   Hodgkin's disease, nodular sclerosis, of lymph nodes of multiple sites 08/23/2015   Visual floaters 09/12/2011   Episodic low back pain 11/15/2010   ROTATOR CUFF INJURY, RIGHT SHOULDER 11/03/2008   ADHESIVE CAPSULITIS OF SHOULDER 11/12/2007   OBESITY NOS 05/17/2006   SYNDROME, CARPAL TUNNEL 05/17/2006   FOOT PAIN 05/17/2006   Hyperlipidemia 02/23/2006   DEPRESSION 02/23/2006   Essential hypertension 02/23/2006    Patient's Medications  New Prescriptions   No medications on file  Previous Medications   CHOLECALCIFEROL (VITAMIN D) 1000 UNITS TABLET    Take 1,000 Units by mouth daily.    DOXYCYCLINE (VIBRAMYCIN) 100 MG CAPSULE    Take 1 capsule(s) 2 TIMES A DAY by oral route for 14 days.   FENOFIBRATE 160 MG TABLET    Take 1 tablet by mouth daily.   FUROSEMIDE (LASIX) 20 MG TABLET    Take 1 tablet (20 mg total) by mouth daily.   GLUCOSAMINE-CHONDROITIN 500-400 MG TABLET       IBUPROFEN (ADVIL) 800 MG TABLET    Take 1 tablet by mouth 3 (three) times daily as needed.   JARDIANCE 25 MG TABS TABLET    Take 25 mg by mouth daily.     METFORMIN (GLUCOPHAGE-XR) 500 MG 24 HR TABLET    Take 500 mg by mouth 2 (two) times daily.   METOPROLOL TARTRATE (LOPRESSOR) 25 MG TABLET    Take 1 tablet (25 mg total) by mouth daily.   MULTIPLE VITAMIN (MULTIVITAMIN WITH MINERALS) TABS TABLET    Take 1 tablet by mouth daily.   OZEMPIC, 1 MG/DOSE, 4 MG/3ML SOPN    Inject 1 mg into the skin every Friday.   REPATHA 140 MG/ML SOSY    Inject 140 mg into the muscle every 14 (fourteen) days.   SPIRONOLACTONE (ALDACTONE) 25 MG TABLET    TAKE 1 TABLET DAILY   VITAMIN E 400 UNIT CAPSULE    Take 400 Units by mouth daily.  Modified Medications   Modified Medication Previous Medication   BICTEGRAVIR-EMTRICITABINE-TENOFOVIR AF (BIKTARVY) 50-200-25 MG TABS TABLET BIKTARVY 50-200-25 MG TABS tablet      TAKE 1 TABLET BY MOUTH 1 TIME A DAY.    TAKE 1 TABLET BY MOUTH 1 TIME A DAY.  Discontinued Medications   No medications on file    Subjective: Holly Stephens is in for her routine HIV follow-up visit.  Last visit she has retired and transition to a [employer].  Fortunately she has had no problems affording all of her medications.  She continues to tolerate Biktarvy well and does not miss doses.  She is enjoying her lifestyle and not having  a schedule.  Review of Systems: Review of Systems  Constitutional:  Negative for fever and weight loss.    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   Human immunodeficiency virus (HIV) disease ([place]) 02/23/2006   Qualifier: Diagnosis of  By: Megan Salon MD, Saniyah Mondesir     Hypertension     Social History   Tobacco Use   Smoking status: Never    Smokeless tobacco: Never  Vaping Use   Vaping Use: Never used  Substance Use Topics   Alcohol use: No   Drug use: No    Family History  Problem Relation Age of Onset   Hypertension Mother    Cancer Mother 48       breast    Hypertension Father    Heart attack Father 60   Pulmonary embolism Sister    Parkinson's disease Brother     Allergies  Allergen Reactions   Augmentin [Amoxicillin-Pot Clavulanate] Hives and Other (See Comments)    Has patient had a PCN reaction causing immediate rash, facial/tongue/throat swelling, SOB or lightheadedness with hypotension: No Has patient had a PCN reaction causing severe rash involving mucus membranes or skin necrosis: No Has patient had a PCN reaction that required hospitalization No Has patient had a PCN reaction occurring within the last 10 years: No If all of the above answers are "NO", then may proceed with Cephalosporin use.   Bleomycin Cough   Cefpodoxime Proxetil Itching and Rash    Pt unsure of allergy    Doxycycline Rash    Health Maintenance  Topic Date Due   Medicare Annual Wellness (AWV)  Never done   OPHTHALMOLOGY EXAM  Never done   Diabetic kidney evaluation - Urine ACR  Never done   Zoster Vaccines- Shingrix (1 of 2) Never done   COLONOSCOPY (Pts 45-43yrs Insurance coverage will need to be confirmed)  Never done   Pneumonia Vaccine 70+ Years old (3 of 3 - PCV) 11/15/2011   COVID-19 Vaccine (3 - Moderna risk series) 07/04/2019   HEMOGLOBIN A1C  01/07/2022   DTaP/Tdap/Td (2 - Td or Tdap) 11/06/2022   FOOT EXAM  04/21/2023   Diabetic kidney evaluation - eGFR measurement  05/11/2023   MAMMOGRAM  12/15/2023   DEXA SCAN  Completed   Hepatitis C Screening  Completed   HPV VACCINES  Aged Out    Objective:  There were no vitals filed for this visit. There is no height or weight on file to calculate BMI.  Physical Exam Constitutional:      Comments: She is in very good spirits.  Cardiovascular:     Rate and  Rhythm: Normal rate.  Pulmonary:     Effort: Pulmonary effort is normal.  Psychiatric:        Mood and Affect: Mood normal.     Lab Results Lab Results  Component Value Date   WBC 6.5 05/11/2022   HGB 17.6 (H) 05/11/2022   HCT 51.5 (H) 05/11/2022   MCV 90.8 05/11/2022   PLT 277 05/11/2022    Lab Results  Component Value Date   CREATININE 0.55  05/11/2022   BUN 14 05/11/2022   NA 143 05/11/2022   K 4.7 05/11/2022   CL 103 05/11/2022   CO2 28 05/11/2022    Lab Results  Component Value Date   ALT 34 (H) 05/11/2022   AST 24 05/11/2022   ALKPHOS 40 12/27/2021   BILITOT 0.4 05/11/2022    Lab Results  Component Value Date   CHOL 195 05/11/2022   HDL 47 (L) 05/11/2022   LDLCALC 107 (H) 05/11/2022   TRIG 289 (H) 05/11/2022   CHOLHDL 4.1 05/11/2022   Lab Results  Component Value Date   LABRPR NON-REACTIVE 05/11/2022   HIV 1 RNA Quant  Date Value  05/11/2022 Not Detected Copies/mL  05/26/2021 NOT DETECTED copies/mL  05/26/2020 Not Detected Copies/mL   CD4 T Cell Abs (/uL)  Date Value  05/11/2022 849  05/26/2021 662  05/26/2020 850     Problem List Items Addressed This Visit       High   Human immunodeficiency virus (HIV) disease - Primary    Her infection remains under excellent, long-term control.  She will continue Biktarvy and follow-up after lab work in 1 year.      Relevant Medications   bictegravir-emtricitabine-tenofovir AF (BIKTARVY) 50-200-25 MG TABS tablet   Other Relevant Orders   CBC   T-helper cells (CD4) count (not at [facility])   Comprehensive metabolic panel   RPR   HIV-1 RNA quant-no reflex-bld      Michel Bickers, MD [facility] [place] [phone] pager   [phone] cell 05/25/2022, 9:04 AM

## 2022-05-25 NOTE — Assessment & Plan Note (Signed)
Her infection remains under excellent, long-term control.  She will continue Biktarvy and follow-up after lab work in 1 year. 

## 2022-06-05 ENCOUNTER — Other Ambulatory Visit: Payer: Self-pay | Admitting: Internal Medicine

## 2022-06-05 DIAGNOSIS — B20 Human immunodeficiency virus [HIV] disease: Secondary | ICD-10-CM

## 2022-06-19 ENCOUNTER — Other Ambulatory Visit: Payer: Self-pay | Admitting: Internal Medicine

## 2022-06-19 ENCOUNTER — Other Ambulatory Visit: Payer: Self-pay

## 2022-06-19 DIAGNOSIS — B20 Human immunodeficiency virus [HIV] disease: Secondary | ICD-10-CM

## 2022-06-19 MED ORDER — BIKTARVY 50-200-25 MG PO TABS: ORAL_TABLET | ORAL | 10 refills | Status: DC

## 2022-06-28 ENCOUNTER — Other Ambulatory Visit (HOSPITAL_COMMUNITY): Payer: Self-pay | Admitting: Family Medicine

## 2022-06-28 DIAGNOSIS — Z1231 Encounter for screening mammogram for malignant neoplasm of breast: Secondary | ICD-10-CM

## 2022-07-05 ENCOUNTER — Ambulatory Visit (INDEPENDENT_AMBULATORY_CARE_PROVIDER_SITE_OTHER): Payer: Medicare Other | Admitting: Podiatry

## 2022-07-05 ENCOUNTER — Encounter: Payer: Self-pay | Admitting: Podiatry

## 2022-07-05 DIAGNOSIS — M79675 Pain in left toe(s): Secondary | ICD-10-CM

## 2022-07-05 DIAGNOSIS — E119 Type 2 diabetes mellitus without complications: Secondary | ICD-10-CM | POA: Diagnosis not present

## 2022-07-05 DIAGNOSIS — B351 Tinea unguium: Secondary | ICD-10-CM

## 2022-07-05 DIAGNOSIS — M79674 Pain in right toe(s): Secondary | ICD-10-CM | POA: Diagnosis not present

## 2022-07-05 NOTE — Progress Notes (Signed)
  Subjective:  Patient ID: Holly Stephens, female    DOB: 1956/02/01,   MRN: [id number]  Chief Complaint  Patient presents with   routine foot care     67 y.o. female presents for concern of thickened elongated and painful nails that are difficult to trim. Requesting to have them trimmed today. Relates burning and tingling in their feet. Patient is diabetic and last A1c was  Lab Results  Component Value Date   HGBA1C 5.5 07/07/2021   .   PCP:  Benita Stabile, MD    . Denies any other pedal complaints. Denies n/v/f/c.   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   Human immunodeficiency virus (HIV) disease (HCC) 02/23/2006   Qualifier: Diagnosis of  By: Orvan Falconer MD, John     Hypertension     Objective:  Physical Exam: Vascular: DP/PT pulses 2/4 bilateral. CFT <3 seconds. Absent hair growth on digits. Edema noted to bilateral lower extremities. Xerosis noted bilaterally.  Skin. No lacerations or abrasions bilateral feet. Nails 1-5 bilateral  are thickened discolored and elongated with subungual debris.  Musculoskeletal: MMT 5/5 bilateral lower extremities in DF, PF, Inversion and Eversion. Deceased ROM in DF of ankle joint.  Neurological: Sensation intact to light touch. Protective sensation diminished bilateral.    Assessment:   1. Pain due to onychomycosis of toenails of both feet   2. Controlled type 2 diabetes mellitus without complication, without long-term current use of insulin (HCC)      Plan:  Patient was evaluated and treated and all questions answered. -Discussed and educated patient on diabetic  foot care, especially with  regards to the vascular, neurological and musculoskeletal systems.  -Stressed the importance of good glycemic control and the detriment of not  controlling glucose levels in relation to the foot. -Discussed supportive shoes at all times and checking feet regularly.  -Mechanically debrided all nails 1-5 bilateral using sterile nail nipper and filed with dremel without incident  -Answered all patient questions -Patient to return  in 3 months for at risk foot care -Patient advised to call the office if any problems or questions arise in the meantime.   Louann Sjogren, DPM

## 2022-11-08 ENCOUNTER — Encounter: Payer: Self-pay | Admitting: Podiatry

## 2022-11-08 ENCOUNTER — Ambulatory Visit (INDEPENDENT_AMBULATORY_CARE_PROVIDER_SITE_OTHER): Payer: Medicare Other | Admitting: Podiatry

## 2022-11-08 DIAGNOSIS — B351 Tinea unguium: Secondary | ICD-10-CM

## 2022-11-08 DIAGNOSIS — L600 Ingrowing nail: Secondary | ICD-10-CM | POA: Diagnosis not present

## 2022-11-08 DIAGNOSIS — M79674 Pain in right toe(s): Secondary | ICD-10-CM

## 2022-11-08 DIAGNOSIS — M79675 Pain in left toe(s): Secondary | ICD-10-CM | POA: Diagnosis not present

## 2022-11-08 DIAGNOSIS — E119 Type 2 diabetes mellitus without complications: Secondary | ICD-10-CM | POA: Diagnosis not present

## 2022-11-13 NOTE — Progress Notes (Signed)
Subjective:  Patient ID: Holly Stephens, female    DOB: [birth year],  MRN: [id number]  Holly Stephens presents to clinic today for preventative diabetic foot care and painful elongated mycotic toenails 1-5 bilaterally which are tender when wearing enclosed shoe gear. Pain is relieved with periodic professional debridement.   New problem(s): None.   PCP is Benita Stabile, MD.  Allergies  Allergen Reactions   Augmentin [Amoxicillin-Pot Clavulanate] Hives and Other (See Comments)    Has patient had a PCN reaction causing immediate rash, facial/tongue/throat swelling, SOB or lightheadedness with hypotension: No Has patient had a PCN reaction causing severe rash involving mucus membranes or skin necrosis: No Has patient had a PCN reaction that required hospitalization No Has patient had a PCN reaction occurring within the last 10 years: No If all of the above answers are "NO", then may proceed with Cephalosporin use.   Bleomycin Cough   Cefpodoxime Proxetil Itching and Rash    Pt unsure of allergy    Doxycycline Rash    Review of Systems: Negative except as noted in the HPI.  Objective:  There were no vitals filed for this visit. Holly Stephens is a pleasant 67 y.o. female in NAD. AAO x 3.  Vascular Examination: Capillary refill time to digits immediate b/l. Palpable pedal pulses b/l LE. Pedal hair sparse. No pain with calf compression b/l. Lower extremity skin temperature gradient within normal limits. No edema noted b/l LE. No cyanosis or clubbing noted b/l LE.  Dermatological Examination: Pedal skin is warm and supple b/l LE. No open wounds b/l LE. No interdigital macerations noted b/l LE. Toenail(s) 1-5 bilaterally elongated, discolored, dystrophic, thickened. Nails are crumbly with subungual debris and there is exquisite tenderness to dorsal palpation. No subungual wound(s) noted.  Incurvated nailplate right great toe lateral border(s) and left 2nd digit medial border with  tenderness to palpation. No erythema, no edema, no drainage noted.  Neurological Examination: Pt has subjective symptoms of neuropathy. Protective sensation intact 5/5 intact bilaterally with 10g monofilament b/l. Vibratory sensation intact b/l.  Musculoskeletal Examination: Muscle strength 5/5 to all lower extremity muscle groups bilaterally. No pain, crepitus or joint limitation noted with ROM bilateral LE. Pes planus deformity noted bilateral LE. Patient ambulates independent of any assistive aids.  Assessment/Plan: 1. Pain due to onychomycosis of toenails of both feet   2. Ingrown toenail without infection   3. Controlled type 2 diabetes mellitus without complication, without long-term current use of insulin (HCC)     -Consent given for treatment as described below: -Examined patient. -Continue foot and shoe inspections daily. Monitor blood glucose per PCP/Endocrinologist's recommendations. -Continue supportive shoe gear daily. -Mycotic toenails 1-5 bilaterally were debrided in length and girth with sterile nail nippers and dremel without incident. -No invasive procedure(s) performed. Offending nail border debrided and curretaged L 2nd toe and right great toe utilizing sterile nail nipper and currette. Border(s) cleansed with hydrogen peroxide and triple antibiotic ointment applied. Dispensed written instructions for once daily epsom salt soaks for 3 days. Call office if there are any concerns. -Patient/POA to call should there be question/concern in the interim.   Return in about 3 months (around 02/07/2023).  Freddie Breech, DPM

## 2022-12-18 ENCOUNTER — Encounter (HOSPITAL_COMMUNITY): Payer: Self-pay

## 2022-12-18 ENCOUNTER — Ambulatory Visit (HOSPITAL_COMMUNITY)
Admission: RE | Admit: 2022-12-18 | Discharge: 2022-12-18 | Disposition: A | Discharge status: Home / Self Care | Payer: Medicare Other | Source: Ambulatory Visit | Attending: Family Medicine | Admitting: Family Medicine

## 2022-12-18 DIAGNOSIS — Z1231 Encounter for screening mammogram for malignant neoplasm of breast: Secondary | ICD-10-CM | POA: Diagnosis present

## 2022-12-26 ENCOUNTER — Encounter: Payer: Self-pay | Admitting: Internal Medicine

## 2022-12-28 ENCOUNTER — Inpatient Hospital Stay: Payer: Medicare Other | Attending: Hematology

## 2022-12-28 DIAGNOSIS — Z881 Allergy status to other antibiotic agents status: Secondary | ICD-10-CM | POA: Insufficient documentation

## 2022-12-28 DIAGNOSIS — B20 Human immunodeficiency virus [HIV] disease: Secondary | ICD-10-CM | POA: Insufficient documentation

## 2022-12-28 DIAGNOSIS — K439 Ventral hernia without obstruction or gangrene: Secondary | ICD-10-CM | POA: Insufficient documentation

## 2022-12-28 DIAGNOSIS — C8118 Nodular sclerosis classical Hodgkin lymphoma, lymph nodes of multiple sites: Secondary | ICD-10-CM | POA: Diagnosis present

## 2022-12-28 DIAGNOSIS — Z86718 Personal history of other venous thrombosis and embolism: Secondary | ICD-10-CM | POA: Diagnosis not present

## 2022-12-28 DIAGNOSIS — I110 Hypertensive heart disease with heart failure: Secondary | ICD-10-CM | POA: Diagnosis not present

## 2022-12-28 DIAGNOSIS — D3502 Benign neoplasm of left adrenal gland: Secondary | ICD-10-CM | POA: Diagnosis not present

## 2022-12-28 DIAGNOSIS — Z923 Personal history of irradiation: Secondary | ICD-10-CM | POA: Diagnosis not present

## 2022-12-28 DIAGNOSIS — Z79899 Other long term (current) drug therapy: Secondary | ICD-10-CM | POA: Insufficient documentation

## 2022-12-28 DIAGNOSIS — I700 Atherosclerosis of aorta: Secondary | ICD-10-CM | POA: Diagnosis not present

## 2022-12-28 DIAGNOSIS — Z880 Allergy status to penicillin: Secondary | ICD-10-CM | POA: Insufficient documentation

## 2022-12-28 LAB — CBC WITH DIFFERENTIAL/PLATELET
Abs Immature Granulocytes: 0.02 10*3/uL (ref 0.00–0.07)
Basophils Absolute: 0 10*3/uL (ref 0.0–0.1)
Basophils Relative: 1 %
Eosinophils Absolute: 0.1 10*3/uL (ref 0.0–0.5)
Eosinophils Relative: 2 %
HCT: 51.9 % — ABNORMAL HIGH (ref 36.0–46.0)
Hemoglobin: 16.8 g/dL — ABNORMAL HIGH (ref 12.0–15.0)
Immature Granulocytes: 0 %
Lymphocytes Relative: 41 %
Lymphs Abs: 2.6 10*3/uL (ref 0.7–4.0)
MCH: 30.4 pg (ref 26.0–34.0)
MCHC: 32.4 g/dL (ref 30.0–36.0)
MCV: 94 fL (ref 80.0–100.0)
Monocytes Absolute: 0.5 10*3/uL (ref 0.1–1.0)
Monocytes Relative: 7 %
Neutro Abs: 3.1 10*3/uL (ref 1.7–7.7)
Neutrophils Relative %: 49 %
Platelets: 253 10*3/uL (ref 150–400)
RBC: 5.52 MIL/uL — ABNORMAL HIGH (ref 3.87–5.11)
RDW: 13.1 % (ref 11.5–15.5)
WBC: 6.3 10*3/uL (ref 4.0–10.5)
nRBC: 0 % (ref 0.0–0.2)

## 2022-12-28 LAB — COMPREHENSIVE METABOLIC PANEL
ALT: 28 U/L (ref 0–44)
AST: 27 U/L (ref 15–41)
Albumin: 4.3 g/dL (ref 3.5–5.0)
Alkaline Phosphatase: 45 U/L (ref 38–126)
Anion gap: 12 (ref 5–15)
BUN: 18 mg/dL (ref 8–23)
CO2: 25 mmol/L (ref 22–32)
Calcium: 10 mg/dL (ref 8.9–10.3)
Chloride: 102 mmol/L (ref 98–111)
Creatinine, Ser: 0.75 mg/dL (ref 0.44–1.00)
GFR, Estimated: >60 mL/min (ref >60)
Glucose, Bld: 164 mg/dL — ABNORMAL HIGH (ref 70–99)
Potassium: 4.1 mmol/L (ref 3.5–5.1)
Sodium: 139 mmol/L (ref 135–145)
Total Bilirubin: 0.7 mg/dL (ref <1.2)
Total Protein: 7.7 g/dL (ref 6.5–8.1)

## 2022-12-28 LAB — VITAMIN D 25 HYDROXY (VIT D DEFICIENCY, FRACTURES): Vit D, 25-Hydroxy: 36.48 ng/mL (ref 30–100)

## 2022-12-28 LAB — LACTATE DEHYDROGENASE: LDH: 122 U/L (ref 98–192)

## 2023-01-03 NOTE — Progress Notes (Signed)
[facility] [address] [place] [address]    Clinic Day:  01/04/2023  Referring physician: Benita Stabile, MD  Patient Care Team: Benita Stabile, MD as PCP - General (Internal Medicine) Cliffton Asters, MD (Inactive) as PCP - Infectious Diseases (Infectious Diseases) Plyler, Cecil Cranker, RD as Dietitian (Dietician)   ASSESSMENT & PLAN:   Assessment: 1.  Hodgkin's Lymphoma, nodular-sclerosing type involving multiple lymph nodes: -Left axilla pathology : CD30, CD15, and PAX-5 positive. -ABVD chemotherapy beginning on 09/13/2015, with discontinuation of Bleomycin on 10/11/2015. -Mid-course PET scan revealed Deauville 3 lymphadenopathy, so she received 4 additional cycles of AVD. -Treatment course was complicated by diastolic heart failure. -Chemotherapy completed on 02/23/2016. -Post-treatment PET revealed mild residual Left axillary adenopathy with a Deauville 2, so she was referred for axillary radiation therapy. -Completed XRT to left axilla on 05/23/16. -repeat PET on 07/2016 showed mild residual low level metabolic activity within left axillary lymph nodes a similar to prior deauville 2.   2.  HIV: -She continues to follow up with Infectious disease.   3.  Chemo induced heart failure: -She continues to follow up with cardiology.   Plan: 1.  Hodgkin's Lymphoma, nodular-sclerosing type involving multiple lymph nodes: - She does not report any B symptoms or infections. - No lymphadenopathy on physical exam. - Reviewed labs from 12/28/2022: Normal LDH.  CBC is grossly normal. - Recommend follow-up in 1 year with repeat labs and exam.   2.  Mild hypercalcemia: - She had mild elevated calcium level in the past.  Further workup including myeloma panel was normal.  Calcium has normalized at this visit.   3.  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 - Likely secondary erythrocytosis from diuretic use.  She denies  history of sleep apnea.  She is non-smoker.  Will continue to monitor.  Orders Placed This Encounter  Procedures   CBC with Differential    Standing Status:   Future    Standing Expiration Date:   01/04/2024   Comprehensive metabolic panel    Standing Status:   Future    Standing Expiration Date:   01/04/2024   Lactate dehydrogenase    Standing Status:   Future    Standing Expiration Date:   01/04/2024   VITAMIN D 25 Hydroxy (Vit-D Deficiency, Fractures)    Standing Status:   Future    Standing Expiration Date:   01/04/2024      Alben Deeds Teague,acting as a scribe for Doreatha Massed, MD.,have documented all relevant documentation on the behalf of Doreatha Massed, MD,as directed by  Doreatha Massed, MD while in the presence of Doreatha Massed, MD.   I, Doreatha Massed MD, have reviewed the above documentation for accuracy and completeness, and I agree with the above.   Doreatha Massed, MD   11/14/20244:53 PM  CHIEF COMPLAINT:   Diagnosis: Hodgkin's disease, nodular sclerosis, of lymph nodes of multiple sites   Cancer Staging  Hodgkin's disease, nodular sclerosis, of lymph nodes of multiple sites [facility]) Staging form: Lymphoid Neoplasms, AJCC 6th Edition - Clinical stage from 08/31/2015: Stage III - Signed by Ellouise Newer, PA-C on 09/01/2015    Prior Therapy: 1.  ABVD from 09/13/2015 to 02/23/2016. 2. XRT to left axilla through 05/23/2016.  Current Therapy:  Surveillance   HISTORY OF PRESENT ILLNESS:   Oncology History  Hodgkin's disease, nodular sclerosis, of lymph nodes of multiple sites (HCC)  07/23/2015 Procedure   Lext  axilla lymph node biopsy   07/27/2015 Pathology Results   Classical [name], nodular sclerosis type.  Positive CD30, CD15, andPAX-5.  Negative CD45 and CD20.   08/31/2015 PET scan   Hypermetabolic lymphadenopathy. Nodal stations include: L supraclavicular nodes, L axillary nodes, mediastinal lymph nodes,  periaortic retroperitoneal and periportal lymph nodes as well as L iliac and inguinal lymph nodes. Two concerning pulm nodules...   09/01/2015 Imaging   MUGA- Normal LEFT ventricular ejection fraction of 73%.  Normal LEFT ventricular wall motion.   09/01/2015 Procedure   PFTs-  Spirometry shows mild ventilatory defect without airflow obstruction and no bronchodilator improvement. Lung volumes are normal. Airway resistance is normal. DLCO normal. Essentially normal lung function   09/08/2015 Procedure   Port placed by Dr. Earlene Plater   09/13/2015 - 02/23/2016 Chemotherapy   ABVD   09/27/2015 Imaging   CT angio of chest- no gross PE seen. Improving axillary, mediastinal and upper abdominal adenopathy. Improved and possibly resolved RUL nodule and with resolved or obscured LLL nodule. Small L pleural effusion and mild L lower lobe atelectasis.   10/11/2015 Treatment Plan Change   Bleomycin discontinued    11/01/2015 PET scan   Clear interval response to therapy with decrease in size and hypermetabolism of larger lymph nodes seen previously and some of the smaller hypermetabolic lymph node seen on the previous study have resolved completely in terms of abnormal enlargement and hypermetabolism. 2. Slight decrease in left lower lobe pulmonary nodule with interval resolution of the right upper lobe pulmonary nodule. 3. Stable appearance of mild heterogeneous FDG accumulation within the marrow space.   11/01/2015 Miscellaneous   Deauville 3- 4 more cycles of AVD recommended according to NCCN guidelines.   02/09/2016 Imaging   [place] left leg- Positive for acute DVT in left calf, popliteal and femoral veins.   03/22/2016 PET scan   1. Mild residual metabolic activity within LEFT axillary lymph nodes ( Deauville 2 ). 2. No additional hypermetabolic adenopathy identified. 3. Normal spleen and marrow activity.   04/25/2016 - 05/23/2016 Radiation Therapy   Residual disease treated with radiation Basilio [place]): Left axilla  treated with 36 Gy in 20 fractions.   07/24/2016 PET scan   PET IMPRESSION: 1. Stable exam. The mild residual low level metabolic activity within left axillary lymph nodes a similar to prior (Deauville 2). 2. No new or progressive findings. 3. Abdominal aortic atherosclerosis.     11/23/2016 Imaging   CT C/A/P: 1. Essentially stable left axillary adenopathy. No new adenopathy identified. 2.  Aortic Atherosclerosis (ICD10-I70.0). 3. Small left adrenal adenoma. 4. Midline ventral hernia contains about 630 cc of omental adipose tissue.      INTERVAL HISTORY:   Holly Stephens is a 67 y.o. female presenting to clinic today for follow up of Hodgkin's disease, nodular sclerosis, of lymph nodes of multiple sites. She was last seen by me on 01/04/22.  Today, she states that she is doing well overall. Her appetite level is at 100%. Her energy level is at 75%. She denies any fevers, night sweats, weight loss, or new lumps/bumps in the last 6 months. She notes trouble swallowing occasionally. She states she does not have sleep apnea nor a history of smoking.   PAST MEDICAL HISTORY:   Past Medical History: Past Medical History:  Diagnosis Date   Acute deep vein thrombosis (DVT) of popliteal vein of left lower extremity (HCC) 02/09/2016   Arthritis    osteoarthritis   CHF (congestive heart failure) (HCC)    EF  NL   Diabetes mellitus without complication (HCC)    History of radiation therapy 04/25/2016 - 05/23/2016   Left Axilla treated to 36 Gy in 20 fractions   [name], nodular sclerosis (HCC) 08/23/2015   Human immunodeficiency virus (HIV) disease (HCC) 02/23/2006   Qualifier: Diagnosis of  By: Orvan Falconer MD, John     Hypertension     Surgical History: Past Surgical History:  Procedure Laterality Date   AXILLARY LYMPH NODE BIOPSY Left 07/23/2015   Procedure: EXCISIONAL BIOPSY LEFT AXILLARY LYMPH NODE;  Surgeon: Ancil Linsey, MD;  Location: [facility];  Service: General;   Laterality: Left;   PORT-A-CATH REMOVAL N/A 01/09/2018   Procedure: MINOR REMOVAL PORT-A-CATH;  Surgeon: Franky Macho, MD;  Location: [facility];  Service: General;  Laterality: N/A;   PORTACATH PLACEMENT Right 09/08/2015   Procedure: INSERTION OF CENTRAL VENOUS CATHETER WITH PORT FOR CHEMOTHERAPY;  Surgeon: Ancil Linsey, MD;  Location: [facility];  Service: General;  Laterality: Right;   SHOULDER SURGERY Right    removal of bone spur   TOTAL HIP ARTHROPLASTY Right 07/07/2021   Procedure: TOTAL HIP ARTHROPLASTY ANTERIOR APPROACH;  Surgeon: Samson Frederic, MD;  Location: [facility];  Service: Orthopedics;  Laterality: Right;  150   WISDOM TOOTH EXTRACTION      Social History: Social History   Socioeconomic History   Marital status: Widowed    Spouse name: Not on file   Number of children: Not on file   Years of education: Not on file   Highest education level: Not on file  Occupational History   Not on file  Tobacco Use   Smoking status: Never   Smokeless tobacco: Never  Vaping Use   Vaping status: Never Used  Substance and Sexual Activity   Alcohol use: No   Drug use: No   Sexual activity: Never    Comment: declined condoms 4/24  Other Topics Concern   Not on file  Social History Narrative   Not on file   Social Determinants of Health   Financial Resource Strain: Not on file  Food Insecurity: Not on file  Transportation Needs: Not on file  Physical Activity: Not on file  Stress: Not on file  Social Connections: Not on file  Intimate Partner Violence: Not At Risk (06/17/2020)   Humiliation, Afraid, Rape, and Kick questionnaire    Fear of Current or Ex-Partner: No    Emotionally Abused: No    Physically Abused: No    Sexually Abused: No    Family History: Family History  Problem Relation Age of Onset   Hypertension Mother    Cancer Mother 54       breast    Hypertension Father    Heart attack Father 61   Pulmonary embolism Sister    Parkinson's disease Brother      Current Medications:  Current Outpatient Medications:    bictegravir-emtricitabine-tenofovir AF (BIKTARVY) 50-200-25 MG TABS tablet, TAKE 1 TABLET BY MOUTH 1 TIME A DAY., Disp: 30 tablet, Rfl: 10   cholecalciferol (VITAMIN D) 1000 units tablet, Take 1,000 Units by mouth daily. , Disp: , Rfl:    doxycycline (VIBRAMYCIN) 100 MG capsule, , Disp: , Rfl:    ezetimibe (ZETIA) 10 MG tablet, Take 10 mg by mouth daily., Disp: , Rfl:    fenofibrate 160 MG tablet, Take 1 tablet by mouth daily., Disp: , Rfl:    furosemide (LASIX) 20 MG tablet, Take 1 tablet (20 mg total) by mouth daily., Disp: 90 tablet,  Rfl: 2   JARDIANCE 25 MG TABS tablet, Take 25 mg by mouth daily. , Disp: , Rfl:    metFORMIN (GLUCOPHAGE-XR) 500 MG 24 hr tablet, Take 500 mg by mouth 2 (two) times daily., Disp: , Rfl:    metoprolol tartrate (LOPRESSOR) 25 MG tablet, Take 1 tablet (25 mg total) by mouth daily., Disp: 30 tablet, Rfl: 0   Multiple Vitamin (MULTIVITAMIN WITH MINERALS) TABS tablet, Take 1 tablet by mouth daily., Disp: , Rfl:    OZEMPIC, 1 MG/DOSE, 4 MG/3ML SOPN, Inject 1 mg into the skin every Friday., Disp: , Rfl:    REPATHA 140 MG/ML SOSY, Inject 140 mg into the muscle every 14 (fourteen) days., Disp: , Rfl:    spironolactone (ALDACTONE) 25 MG tablet, TAKE 1 TABLET DAILY (Patient taking differently: Take 25 mg by mouth daily.), Disp: 90 tablet, Rfl: 3   vitamin E 400 UNIT capsule, Take 400 Units by mouth daily., Disp: , Rfl:    Allergies: Allergies  Allergen Reactions   Augmentin [Amoxicillin-Pot Clavulanate] Hives and Other (See Comments)    Has patient had a PCN reaction causing immediate rash, facial/tongue/throat swelling, SOB or lightheadedness with hypotension: No Has patient had a PCN reaction causing severe rash involving mucus membranes or skin necrosis: No Has patient had a PCN reaction that required hospitalization No Has patient had a PCN reaction occurring within the last 10 years: No If all of the  above answers are "NO", then may proceed with Cephalosporin use.   Bleomycin Cough   Cefpodoxime Proxetil Itching and Rash    Pt unsure of allergy    Doxycycline Rash    REVIEW OF SYSTEMS:   Review of Systems  Constitutional:  Negative for chills, fatigue and fever.  HENT:   Positive for trouble swallowing (liquids, occasionally). Negative for lump/mass, mouth sores, nosebleeds and sore throat.   Eyes:  Negative for eye problems.  Respiratory:  Negative for cough and shortness of breath.   Cardiovascular:  Negative for chest pain, leg swelling and palpitations.  Gastrointestinal:  Positive for constipation (occasional). Negative for abdominal pain, diarrhea, nausea and vomiting.  Genitourinary:  Negative for bladder incontinence, difficulty urinating, dysuria, frequency, hematuria and nocturia.   Musculoskeletal:  Negative for arthralgias, back pain, flank pain, myalgias and neck pain.  Skin:  Negative for itching and rash.  Neurological:  Positive for numbness (in toes). Negative for dizziness and headaches.  Hematological:  Does not bruise/bleed easily.  Psychiatric/Behavioral:  Negative for depression, sleep disturbance and suicidal ideas. The patient is not nervous/anxious.   All other systems reviewed and are negative.    VITALS:   Blood pressure 113/63, pulse 98, temperature 98 F (36.7 C), temperature source Oral, resp. rate 16, weight 232 lb 6.4 oz (105.4 kg), SpO2 97%.  Wt Readings from Last 3 Encounters:  01/04/23 232 lb 6.4 oz (105.4 kg)  05/25/22 236 lb (107 kg)  02/09/22 226 lb (102.5 kg)    Body mass index is 43.91 kg/m.  Performance status (ECOG): 1 - Symptomatic but completely ambulatory  PHYSICAL EXAM:   Physical Exam Vitals and nursing note reviewed. Exam conducted with a chaperone present.  Constitutional:      Appearance: Normal appearance.  Cardiovascular:     Rate and Rhythm: Normal rate and regular rhythm.     Pulses: Normal pulses.     Heart  sounds: Normal heart sounds.  Pulmonary:     Effort: Pulmonary effort is normal.     Breath sounds: Normal breath  sounds.  Abdominal:     Palpations: Abdomen is soft. There is no hepatomegaly, splenomegaly or mass.     Tenderness: There is no abdominal tenderness.  Musculoskeletal:     Right lower leg: No edema.     Left lower leg: No edema.  Lymphadenopathy:     Cervical: No cervical adenopathy.     Right cervical: No superficial, deep or posterior cervical adenopathy.    Left cervical: No superficial, deep or posterior cervical adenopathy.     Upper Body:     Right upper body: No supraclavicular or axillary adenopathy.     Left upper body: No supraclavicular or axillary adenopathy.  Neurological:     General: No focal deficit present.     Mental Status: She is alert and oriented to person, place, and time.  Psychiatric:        Mood and Affect: Mood normal.        Behavior: Behavior normal.     LABS:      Latest Ref Rng & Units 12/28/2022    9:38 AM 05/11/2022    8:47 AM 12/27/2021   11:04 AM  CBC  WBC 4.0 - 10.5 K/uL 6.3  6.5  7.5   Hemoglobin 12.0 - 15.0 g/dL 16.1  09.6  04.5   Hematocrit 36.0 - 46.0 % 51.9  51.5  49.0   Platelets 150 - 400 K/uL 253  277  269       Latest Ref Rng & Units 12/28/2022    9:38 AM 05/11/2022    8:47 AM 12/27/2021   11:04 AM  CMP  Glucose 70 - 99 mg/dL 409  811  914   BUN 8 - 23 mg/dL 18  14  22    Creatinine 0.44 - 1.00 mg/dL 7.82  9.56  2.13   Sodium 135 - 145 mmol/L 139  143  140   Potassium 3.5 - 5.1 mmol/L 4.1  4.7  4.1   Chloride 98 - 111 mmol/L 102  103  102   CO2 22 - 32 mmol/L 25  28  28    Calcium 8.9 - 10.3 mg/dL 08.6  57.8  9.9    46.9   Total Protein 6.5 - 8.1 g/dL 7.7  7.3  7.2   Total Bilirubin <1.2 mg/dL 0.7  0.4  0.8   Alkaline Phos 38 - 126 U/L 45   40   AST 15 - 41 U/L 27  24  28    ALT 0 - 44 U/L 28  34  29      No results found for: "CEA1", "CEA" / No results found for: "CEA1", "CEA" No results found for:  "PSA1" No results found for: "CAN199" No results found for: "CAN125"  Lab Results  Component Value Date   TOTALPROTELP 7.0 12/27/2021   TOTALPROTELP 6.5 12/27/2021   ALBUMINELP 3.8 12/27/2021   A1GS 0.2 12/27/2021   A2GS 0.8 12/27/2021   BETS 1.1 12/27/2021   GAMS 0.6 12/27/2021   MSPIKE Not Observed 12/27/2021   SPEI Comment 12/27/2021   No results found for: "TIBC", "FERRITIN", "IRONPCTSAT" Lab Results  Component Value Date   LDH 122 12/28/2022   LDH 117 12/27/2021   LDH 112 06/24/2021     STUDIES:   MM 3D SCREENING MAMMOGRAM BILATERAL BREAST  Result Date: 12/19/2022 CLINICAL DATA:  Screening. EXAM: DIGITAL SCREENING BILATERAL MAMMOGRAM WITH TOMOSYNTHESIS AND CAD TECHNIQUE: Bilateral screening digital craniocaudal and mediolateral oblique mammograms were obtained. Bilateral screening digital breast tomosynthesis was performed. The  images were evaluated with computer-aided detection. COMPARISON:  Previous exam(s). ACR Breast Density Category c: The breasts are heterogeneously dense, which may obscure small masses. FINDINGS: There are no findings suspicious for malignancy. IMPRESSION: No mammographic evidence of malignancy. A result letter of this screening mammogram will be mailed directly to the patient. RECOMMENDATION: Screening mammogram in one year. (Code:[id number]-B-01Y) BI-RADS CATEGORY  1: Negative. Electronically Signed   By: Frederico Hamman M.D.   On: 12/19/2022 15:38

## 2023-01-04 ENCOUNTER — Inpatient Hospital Stay: Payer: Medicare Other | Admitting: Hematology

## 2023-01-04 VITALS — BP 113/63 | HR 98 | Temp 98.0°F | Resp 16 | Wt 232.4 lb

## 2023-01-04 DIAGNOSIS — C8118 Nodular sclerosis classical Hodgkin lymphoma, lymph nodes of multiple sites: Secondary | ICD-10-CM | POA: Diagnosis not present

## 2023-01-04 NOTE — Patient Instructions (Signed)
[facility] at [facility] Discharge Instructions   You were seen and examined today by Dr. Ellin Saba.  He reviewed the results of your lab work which are normal/stable.   We will see you back in one year. We will repeat lab work at that time.   Return as scheduled.    Thank you for choosing [facility] at [facility] to provide your oncology and hematology care.  To afford each patient quality time with our provider, please arrive at least 15 minutes before your scheduled appointment time.   If you have a lab appointment with the Cancer Center please come in thru the Main Entrance and check in at the main information desk.  You need to re-schedule your appointment should you arrive 10 or more minutes late.  We strive to give you quality time with our providers, and arriving late affects you and other patients whose appointments are after yours.  Also, if you no show three or more times for appointments you may be dismissed from the clinic at the providers discretion.     Again, thank you for choosing [facility].  Our hope is that these requests will decrease the amount of time that you wait before being seen by our physicians.       _____________________________________________________________  Should you have questions after your visit to [facility], please contact our office at [phone] and follow the prompts.  Our office hours are 8:00 a.m. and 4:30 p.m. Monday - Friday.  Please note that voicemails left after 4:00 p.m. may not be returned until the following business day.  We are closed weekends and major holidays.  You do have access to a nurse 24-7, just call the main number to the clinic [phone] and do not press any options, hold on the line and a nurse will answer the phone.    For prescription refill requests, have your pharmacy contact our office and allow 72 hours.    Due to Covid, you will need  to wear a mask upon entering the hospital. If you do not have a mask, a mask will be given to you at the Main Entrance upon arrival. For doctor visits, patients may have 1 support person age 29 or older with them. For treatment visits, patients can not have anyone with them due to social distancing guidelines and our immunocompromised population.

## 2023-02-28 ENCOUNTER — Ambulatory Visit: Payer: Medicare Other | Admitting: Podiatry

## 2023-03-01 ENCOUNTER — Other Ambulatory Visit: Payer: Self-pay

## 2023-03-01 DIAGNOSIS — B20 Human immunodeficiency virus [HIV] disease: Secondary | ICD-10-CM

## 2023-03-01 MED ORDER — BIKTARVY 50-200-25 MG PO TABS: ORAL_TABLET | ORAL | 5 refills | Status: DC

## 2023-03-06 ENCOUNTER — Other Ambulatory Visit (HOSPITAL_COMMUNITY): Payer: Self-pay

## 2023-03-06 ENCOUNTER — Other Ambulatory Visit: Payer: Self-pay

## 2023-03-06 DIAGNOSIS — B20 Human immunodeficiency virus [HIV] disease: Secondary | ICD-10-CM

## 2023-03-06 MED ORDER — BIKTARVY 50-200-25 MG PO TABS: ORAL_TABLET | ORAL | 5 refills | Status: DC

## 2023-04-30 ENCOUNTER — Other Ambulatory Visit: Payer: Self-pay

## 2023-04-30 DIAGNOSIS — Z113 Encounter for screening for infections with a predominantly sexual mode of transmission: Secondary | ICD-10-CM

## 2023-04-30 DIAGNOSIS — B20 Human immunodeficiency virus [HIV] disease: Secondary | ICD-10-CM

## 2023-05-09 ENCOUNTER — Other Ambulatory Visit: Payer: Self-pay

## 2023-05-09 ENCOUNTER — Other Ambulatory Visit: Payer: Medicare Other

## 2023-05-09 DIAGNOSIS — B20 Human immunodeficiency virus [HIV] disease: Secondary | ICD-10-CM

## 2023-05-09 DIAGNOSIS — Z113 Encounter for screening for infections with a predominantly sexual mode of transmission: Secondary | ICD-10-CM

## 2023-05-11 LAB — COMPLETE METABOLIC PANEL WITH GFR
AG Ratio: 2 (calc) (ref 1.0–2.5)
ALT: 30 U/L — ABNORMAL HIGH (ref 6–29)
AST: 23 U/L (ref 10–35)
Albumin: 4.8 g/dL (ref 3.6–5.1)
Alkaline phosphatase (APISO): 48 U/L (ref 37–153)
BUN: 16 mg/dL (ref 7–25)
CO2: 30 mmol/L (ref 20–32)
Calcium: 10.2 mg/dL (ref 8.6–10.4)
Chloride: 100 mmol/L (ref 98–110)
Creat: 0.6 mg/dL (ref 0.50–1.05)
Globulin: 2.4 g/dL (ref 1.9–3.7)
Glucose, Bld: 150 mg/dL — ABNORMAL HIGH (ref 65–99)
Potassium: 4.4 mmol/L (ref 3.5–5.3)
Sodium: 139 mmol/L (ref 135–146)
Total Bilirubin: 0.4 mg/dL (ref 0.2–1.2)
Total Protein: 7.2 g/dL (ref 6.1–8.1)

## 2023-05-11 LAB — CBC WITH DIFFERENTIAL/PLATELET
Absolute Lymphocytes: 2398 {cells}/uL (ref 850–3900)
Absolute Monocytes: 511 {cells}/uL (ref 200–950)
Basophils Absolute: 50 {cells}/uL (ref 0–200)
Basophils Relative: 0.7 %
Eosinophils Absolute: 108 {cells}/uL (ref 15–500)
Eosinophils Relative: 1.5 %
HCT: 49.9 % — ABNORMAL HIGH (ref 35.0–45.0)
Hemoglobin: 17 g/dL — ABNORMAL HIGH (ref 11.7–15.5)
MCH: 30.8 pg (ref 27.0–33.0)
MCHC: 34.1 g/dL (ref 32.0–36.0)
MCV: 90.4 fL (ref 80.0–100.0)
MPV: 9.5 fL (ref 7.5–12.5)
Monocytes Relative: 7.1 %
Neutro Abs: 4133 {cells}/uL (ref 1500–7800)
Neutrophils Relative %: 57.4 %
Platelets: 258 10*3/uL (ref 140–400)
RBC: 5.52 10*6/uL — ABNORMAL HIGH (ref 3.80–5.10)
RDW: 12.7 % (ref 11.0–15.0)
Total Lymphocyte: 33.3 %
WBC: 7.2 10*3/uL (ref 3.8–10.8)

## 2023-05-11 LAB — HIV-1 RNA QUANT-NO REFLEX-BLD
HIV 1 RNA Quant: NOT DETECTED {copies}/mL
HIV-1 RNA Quant, Log: NOT DETECTED {Log_copies}/mL

## 2023-05-11 LAB — T-HELPER CELL (CD4) - (RCID CLINIC ONLY)
CD4 % Helper T Cell: 41 % (ref 33–65)
CD4 T Cell Abs: 840 /uL (ref 400–1790)

## 2023-05-11 LAB — RPR: RPR Ser Ql: NONREACTIVE

## 2023-05-23 ENCOUNTER — Other Ambulatory Visit: Payer: Self-pay

## 2023-05-23 ENCOUNTER — Encounter: Payer: Self-pay | Admitting: Internal Medicine

## 2023-05-23 ENCOUNTER — Ambulatory Visit: Payer: Self-pay | Admitting: Internal Medicine

## 2023-05-23 VITALS — BP 127/74 | HR 77 | Resp 16 | Ht 61.0 in | Wt 232.0 lb

## 2023-05-23 DIAGNOSIS — Z5181 Encounter for therapeutic drug level monitoring: Secondary | ICD-10-CM | POA: Insufficient documentation

## 2023-05-23 DIAGNOSIS — B20 Human immunodeficiency virus [HIV] disease: Secondary | ICD-10-CM

## 2023-05-23 DIAGNOSIS — Z113 Encounter for screening for infections with a predominantly sexual mode of transmission: Secondary | ICD-10-CM

## 2023-05-23 MED ORDER — BIKTARVY 50-200-25 MG PO TABS: ORAL_TABLET | ORAL | 11 refills | Status: DC

## 2023-05-23 MED ORDER — BIKTARVY 50-200-25 MG PO TABS: ORAL_TABLET | ORAL | 11 refills | Status: AC

## 2023-05-23 NOTE — Progress Notes (Signed)
   Subjective:    Patient ID: Holly Stephens, female    DOB: [birth year], 68 y.o.   MRN: [id number]  HPI Eliska is here for follow-up of HIV She continues on [place] and denies any missed doses.  No issues with getting or taking the medication.  No complaints today.   Review of Systems  Constitutional:  Negative for fatigue.  Gastrointestinal:  Negative for diarrhea and nausea.  Skin:  Negative for rash.       Objective:   Physical Exam Cardiovascular:     Pulses: Normal pulses.  Neurological:     Mental Status: She is alert.   SH: no tobacco        Assessment & Plan:

## 2023-05-23 NOTE — Assessment & Plan Note (Signed)
 She is doing well on her current regimen and no changes indicated.  Refills provided.  Labs reviewed with her and good suppressed virus within normal immune system.  She can return in about 11 months

## 2023-05-23 NOTE — Assessment & Plan Note (Signed)
 Renal function and LFTs normal.

## 2023-05-23 NOTE — Assessment & Plan Note (Signed)
 She was screened and is negative

## 2023-06-06 ENCOUNTER — Ambulatory Visit (INDEPENDENT_AMBULATORY_CARE_PROVIDER_SITE_OTHER): Payer: Medicare Other | Admitting: Podiatry

## 2023-06-06 ENCOUNTER — Encounter: Payer: Self-pay | Admitting: Podiatry

## 2023-06-06 DIAGNOSIS — M79674 Pain in right toe(s): Secondary | ICD-10-CM | POA: Diagnosis not present

## 2023-06-06 DIAGNOSIS — B351 Tinea unguium: Secondary | ICD-10-CM

## 2023-06-06 DIAGNOSIS — M79675 Pain in left toe(s): Secondary | ICD-10-CM | POA: Diagnosis not present

## 2023-06-06 DIAGNOSIS — M2141 Flat foot [pes planus] (acquired), right foot: Secondary | ICD-10-CM

## 2023-06-06 DIAGNOSIS — E119 Type 2 diabetes mellitus without complications: Secondary | ICD-10-CM

## 2023-06-12 ENCOUNTER — Encounter: Payer: Self-pay | Admitting: Podiatry

## 2023-06-12 NOTE — Progress Notes (Signed)
 ANNUAL DIABETIC FOOT EXAM  Subjective: Holly Stephens presents today for annual diabetic foot exam.  Chief Complaint  Patient presents with   Diabetes    "Cut my nails and the yearly foot exam."  Dr. Denman Fischer - saw in February 2025; Glucose today - 160 mg/dl   Patient confirms h/o diabetes.  Patient denies any h/o foot wounds.  Omie Bickers, MD is patient's PCP.  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   Human immunodeficiency virus (HIV) disease (HCC) 02/23/2006   Qualifier: Diagnosis of  By: Daina Drum MD, John     Hypertension    Patient Active Problem List   Diagnosis Date Noted   Medication monitoring encounter 05/23/2023   Osteoarthritis of right hip 05/24/2021   Leukocytosis 01/21/2021   Nasal congestion 01/21/2021   Spasm of back muscles 07/16/2020   Insomnia 07/15/2020   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   Osteoarthritis 07/15/2020   Secondary polycythemia 07/15/2020   Type 2 diabetes mellitus (HCC) 07/15/2020   Routine screening for STI (sexually transmitted infection) 11/11/2019   Screening for cervical cancer 11/11/2019   Pain in left knee 02/19/2018   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/05/2016   Acute deep vein thrombosis (DVT) of popliteal vein of left lower extremity (HCC) 02/09/2016   Congestive heart failure with cardiomyopathy (HCC) 10/27/2015   Acute congestive heart failure (HCC) 10/27/2015   Hodgkin's disease, nodular sclerosis, of lymph nodes of multiple sites (HCC) 08/23/2015   Visual floaters 09/12/2011    Episodic low back pain 11/15/2010   ROTATOR CUFF INJURY, RIGHT SHOULDER 11/03/2008   ADHESIVE CAPSULITIS OF SHOULDER 11/12/2007   OBESITY NOS 05/17/2006   SYNDROME, CARPAL TUNNEL 05/17/2006   FOOT PAIN 05/17/2006   Human immunodeficiency virus (HIV) disease (HCC) 02/23/2006   Hyperlipidemia 02/23/2006   DEPRESSION 02/23/2006   Essential hypertension 02/23/2006   Past Surgical History:  Procedure Laterality Date   AXILLARY LYMPH NODE BIOPSY Left 07/23/2015   Procedure: EXCISIONAL BIOPSY LEFT AXILLARY LYMPH NODE;  Surgeon: Franki Isles, MD;  Location: [facility];  Service: General;  Laterality: Left;   PORT-A-CATH REMOVAL N/A 01/09/2018   Procedure: MINOR REMOVAL PORT-A-CATH;  Surgeon: Alanda Allegra, MD;  Location: [facility];  Service: General;  Laterality: N/A;   PORTACATH PLACEMENT Right 09/08/2015   Procedure: INSERTION OF CENTRAL VENOUS CATHETER WITH PORT FOR CHEMOTHERAPY;  Surgeon: Franki Isles, MD;  Location: [facility];  Service: General;  Laterality: Right;   SHOULDER SURGERY Right    removal of bone spur   TOTAL HIP ARTHROPLASTY Right 07/07/2021   Procedure: TOTAL HIP ARTHROPLASTY ANTERIOR APPROACH;  Surgeon: Adonica Hoose, MD;  Location: [facility];  Service: Orthopedics;  Laterality: Right;  150   WISDOM TOOTH EXTRACTION     Current Outpatient Medications on File Prior to Visit  Medication Sig Dispense Refill   bictegravir-emtricitabine -tenofovir  AF (BIKTARVY ) 50-200-25 MG TABS tablet TAKE 1 TABLET BY MOUTH 1 TIME A DAY. 30 tablet 11   cholecalciferol (VITAMIN D ) 1000 units tablet Take 1,000 Units by mouth daily.  doxycycline  (VIBRAMYCIN ) 100 MG capsule  (Patient not taking: Reported on 06/06/2023)     ezetimibe (ZETIA) 10 MG tablet Take 10 mg by mouth daily.     fenofibrate  160 MG tablet Take 1 tablet by mouth daily.     furosemide  (LASIX ) 20 MG tablet Take 1 tablet (20 mg total) by mouth daily. 90 tablet 2   JARDIANCE  25 MG TABS tablet Take 25 mg by mouth daily.       levofloxacin (LEVAQUIN) 500 MG tablet Take 1 tablet every 24 hours by oral route for 7 days. (Patient not taking: Reported on 05/23/2023)     metFORMIN (GLUCOPHAGE-XR) 500 MG 24 hr tablet Take 500 mg by mouth 2 (two) times daily.     metoprolol  tartrate (LOPRESSOR ) 25 MG tablet Take 1 tablet (25 mg total) by mouth daily. 30 tablet 0   Multiple Vitamin (MULTIVITAMIN WITH MINERALS) TABS tablet Take 1 tablet by mouth daily.     OZEMPIC, 1 MG/DOSE, 4 MG/3ML SOPN Inject 1 mg into the skin every Friday.     REPATHA  140 MG/ML SOSY Inject 140 mg into the muscle every 14 (fourteen) days.     spironolactone  (ALDACTONE ) 25 MG tablet TAKE 1 TABLET DAILY (Patient taking differently: Take 25 mg by mouth daily.) 90 tablet 3   vitamin E 400 UNIT capsule Take 400 Units by mouth daily.     No current facility-administered medications on file prior to visit.    Allergies  Allergen Reactions   Augmentin [Amoxicillin-Pot Clavulanate] Hives and Other (See Comments)    Has patient had a PCN reaction causing immediate rash, facial/tongue/throat swelling, SOB or lightheadedness with hypotension: No Has patient had a PCN reaction causing severe rash involving mucus membranes or skin necrosis: No Has patient had a PCN reaction that required hospitalization No Has patient had a PCN reaction occurring within the last 10 years: No If all of the above answers are "NO", then may proceed with Cephalosporin use.   Bleomycin  Cough   Cefpodoxime  Proxetil Itching and Rash    Pt unsure of allergy    Doxycycline  Rash   Social History   Occupational History   Not on file  Tobacco Use   Smoking status: Never   Smokeless tobacco: Never  Vaping Use   Vaping status: Never Used  Substance and Sexual Activity   Alcohol  use: No   Drug use: No   Sexual activity: Never    Comment: declined condoms 4/24   Family History  Problem Relation Age of Onset   Hypertension Mother    Cancer Mother 98       breast    Hypertension  Father    Heart attack Father 43   Pulmonary embolism Sister    Parkinson's disease Brother    Immunization History  Administered Date(s) Administered   Fluad Trivalent(High Dose 65+) 10/27/2022   H1N1 03/23/2008   Hepatitis B 04/02/2001, 05/17/2001, 09/17/2001   Influenza Inj Mdck Quad With Preservative 11/13/2017   Influenza Split 11/15/2010, 12/05/2011   Influenza Whole 11/15/2005, 11/16/2005, 11/20/2006, 11/12/2007, 12/03/2008, 11/29/2009   Influenza,inj,Quad PF,6+ Mos 11/05/2012, 12/01/2015, 11/11/2021   Influenza-Unspecified 11/19/2013, 11/09/2016, 12/04/2017   Moderna Covid-19 Fall Seasonal Vaccine 21yrs & older 11/11/2021, 10/27/2022   Moderna Covid-19 Vaccine Bivalent Booster 81yrs & up 12/06/2020   Moderna SARS-COV2 Booster Vaccination 12/27/2019   Moderna Sars-Covid-2 Vaccination 05/08/2019, 06/06/2019, 06/14/2020   PNEUMOCOCCAL CONJUGATE-20 12/11/2020   Pneumococcal Polysaccharide-23 11/16/2005, 11/15/2010   Respiratory Syncytial Virus Vaccine,Recomb Aduvanted(Arexvy) 11/11/2021   Tdap  11/05/2012   Zoster Recombinant(Shingrix) 12/11/2020, 02/10/2021     Review of Systems: Negative except as noted in the HPI.   Objective: There were no vitals filed for this visit.  Holly Stephens is a pleasant 68 y.o. female in NAD. AAO X 3.  Diabetic foot exam was performed with the following findings:   Vascular Examination: Capillary refill time immediate b/l. Vascular status intact b/l with palpable pedal pulses. Pedal hair present b/l. No pain with calf compression b/l. Skin temperature gradient WNL b/l. No cyanosis or clubbing b/l. No ischemia or gangrene noted b/l.   Neurological Examination: Sensation grossly intact b/l with 10 gram monofilament. Vibratory sensation intact b/l. Pt has subjective symptoms of neuropathy.  Dermatological Examination: Pedal skin with normal turgor, texture and tone b/l.  No open wounds. No interdigital macerations.   Toenails 1-5 b/l thick,  discolored, elongated with subungual debris and pain on dorsal palpation.   No hyperkeratotic nor porokeratotic lesions present on today's visit.  Musculoskeletal Examination: Muscle strength 5/5 to all lower extremity muscle groups bilaterally. Pes planus b/l LE.  Radiographs: None     Lab Results  Component Value Date   HGBA1C 5.5 07/07/2021   ADA Risk Categorization: Low Risk :  Patient has all of the following: Intact protective sensation No prior foot ulcer  No severe deformity Pedal pulses present  Assessment: 1. Pain due to onychomycosis of toenails of both feet   2. Pes planus of both feet   3. Controlled type 2 diabetes mellitus without complication, without long-term current use of insulin  (HCC)   4. Encounter for diabetic foot exam (HCC)     Plan: Diabetic foot examination performed today. All patient's and/or POA's questions/concerns addressed on today's visit. Toenails 1-5 debrided in length and girth without incident. Continue foot and shoe inspections daily. Monitor blood glucose per PCP/Endocrinologist's recommendations. Continue soft, supportive shoe gear daily. Report any pedal injuries to medical professional. Call office if there are any questions/concerns. -Patient/POA to call should there be question/concern in the interim. No follow-ups on file.  Luella Sager, DPM      [place] LOCATION: [address] [place] [address]                   Office [phone]   [facility] LOCATION: [address] [place], [place] [address] Office [phone]

## 2023-07-03 NOTE — Progress Notes (Signed)
 The 10-year ASCVD risk score (Arnett DK, et al., 2019) is: 19.2%   Values used to calculate the score:     Age: 68 years     Sex: Female     Is Non-Hispanic African American: No     Diabetic: Yes     Tobacco smoker: No     Systolic Blood Pressure: 127 mmHg     Is BP treated: Yes     HDL Cholesterol: 47 mg/dL     Total Cholesterol: 195 mg/dL  Arlon Bergamo, BSN, RN

## 2023-09-04 ENCOUNTER — Other Ambulatory Visit (HOSPITAL_COMMUNITY): Payer: Self-pay

## 2023-09-18 ENCOUNTER — Ambulatory Visit (INDEPENDENT_AMBULATORY_CARE_PROVIDER_SITE_OTHER): Admitting: Podiatry

## 2023-09-18 ENCOUNTER — Encounter: Payer: Self-pay | Admitting: Podiatry

## 2023-09-18 DIAGNOSIS — M79674 Pain in right toe(s): Secondary | ICD-10-CM | POA: Diagnosis not present

## 2023-09-18 DIAGNOSIS — M79675 Pain in left toe(s): Secondary | ICD-10-CM

## 2023-09-18 DIAGNOSIS — E119 Type 2 diabetes mellitus without complications: Secondary | ICD-10-CM | POA: Diagnosis not present

## 2023-09-18 DIAGNOSIS — B351 Tinea unguium: Secondary | ICD-10-CM

## 2023-09-20 ENCOUNTER — Encounter: Payer: Self-pay | Admitting: Podiatry

## 2023-09-20 NOTE — Progress Notes (Signed)
  Subjective:  Patient ID: Holly Stephens, female    DOB: Dec 20, 1955,  MRN: [id number]  Holly Stephens presents to clinic today for preventative diabetic foot care and painful, discolored, thick toenails which interfere with daily activities  Chief Complaint  Patient presents with   Dfc    Rm16 Diabetic foot care/ Dr. Shona May 2025/ A1C7.1   New problem(s): None.   PCP is Shona Norleen PEDLAR, MD.  Allergies  Allergen Reactions   Augmentin [Amoxicillin-Pot Clavulanate] Hives and Other (See Comments)    Has patient had a PCN reaction causing immediate rash, facial/tongue/throat swelling, SOB or lightheadedness with hypotension: No Has patient had a PCN reaction causing severe rash involving mucus membranes or skin necrosis: No Has patient had a PCN reaction that required hospitalization No Has patient had a PCN reaction occurring within the last 10 years: No If all of the above answers are NO, then may proceed with Cephalosporin use.   Bleomycin  Cough   Cefpodoxime  Proxetil Itching and Rash    Pt unsure of allergy    Doxycycline  Rash    Review of Systems: Negative except as noted in the HPI.  Objective: No changes noted in today's physical examination. There were no vitals filed for this visit. Holly Stephens is a pleasant 68 y.o. female in NAD. AAO x 3.  Vascular Examination: Capillary refill time immediate b/l. Palpable pedal pulses. Pedal hair present b/l. Pedal edema absent b/l. No pain with calf compression b/l. Skin temperature gradient WNL b/l. No cyanosis or clubbing b/l. No ischemia or gangrene noted b/l.   Neurological Examination: Sensation grossly intact b/l with 10 gram monofilament. Vibratory sensation intact b/l. Pt has subjective symptoms of neuropathy.  Dermatological Examination: Pedal skin with normal turgor, texture and tone b/l.  No open wounds. No interdigital macerations.   Toenails 1-5 b/l thick, discolored, elongated with subungual debris and pain on  dorsal palpation.   No corns, calluses, nor porokeratotic lesions.  Musculoskeletal Examination: Muscle strength 5/5 to all lower extremity muscle groups bilaterally. No pain, crepitus or joint limitation noted with ROM bilateral LE. Pes planus deformity noted bilateral LE. Patient ambulates independent of any assistive aids.  Radiographs: None  Assessment/Plan: 1. Pain due to onychomycosis of toenails of both feet   2. Controlled type 2 diabetes mellitus without complication, without long-term current use of insulin  [facility])     Consent given for treatment. Patient examined. All patient's and/or POA's questions/concerns addressed on today's visit. Mycotic toenails 1-5 debrided in length and girth without incident. Continue foot and shoe inspections daily. Monitor blood glucose per PCP/Endocrinologist's recommendations.Continue soft, supportive shoe gear daily. Report any pedal injuries to medical professional. Call office if there are any quesitons/concerns. -Patient/POA to call should there be question/concern in the interim.   Return in about 3 months (around 12/19/2023).  Holly Stephens, DPM      [place] LOCATION: [address] [place] [address]                   Office [phone]   [facility] LOCATION: [address] [place], [place] [address] Office [phone]

## 2023-11-07 ENCOUNTER — Other Ambulatory Visit (HOSPITAL_COMMUNITY): Payer: Self-pay | Admitting: Internal Medicine

## 2023-11-07 DIAGNOSIS — Z1231 Encounter for screening mammogram for malignant neoplasm of breast: Secondary | ICD-10-CM

## 2023-12-21 ENCOUNTER — Ambulatory Visit (HOSPITAL_COMMUNITY)
Admission: RE | Admit: 2023-12-21 | Discharge: 2023-12-21 | Disposition: A | Discharge status: Home / Self Care | Source: Ambulatory Visit | Attending: Internal Medicine | Admitting: Internal Medicine

## 2023-12-21 ENCOUNTER — Encounter (HOSPITAL_COMMUNITY): Payer: Self-pay

## 2023-12-21 DIAGNOSIS — Z1231 Encounter for screening mammogram for malignant neoplasm of breast: Secondary | ICD-10-CM | POA: Diagnosis present

## 2023-12-27 ENCOUNTER — Inpatient Hospital Stay: Payer: Medicare Other | Attending: Oncology

## 2023-12-27 DIAGNOSIS — T451X5A Adverse effect of antineoplastic and immunosuppressive drugs, initial encounter: Secondary | ICD-10-CM | POA: Insufficient documentation

## 2023-12-27 DIAGNOSIS — E119 Type 2 diabetes mellitus without complications: Secondary | ICD-10-CM | POA: Insufficient documentation

## 2023-12-27 DIAGNOSIS — Z21 Asymptomatic human immunodeficiency virus [HIV] infection status: Secondary | ICD-10-CM | POA: Insufficient documentation

## 2023-12-27 DIAGNOSIS — Z820 Family history of epilepsy and other diseases of the nervous system: Secondary | ICD-10-CM | POA: Insufficient documentation

## 2023-12-27 DIAGNOSIS — D3502 Benign neoplasm of left adrenal gland: Secondary | ICD-10-CM | POA: Insufficient documentation

## 2023-12-27 DIAGNOSIS — Z881 Allergy status to other antibiotic agents status: Secondary | ICD-10-CM | POA: Diagnosis not present

## 2023-12-27 DIAGNOSIS — Z880 Allergy status to penicillin: Secondary | ICD-10-CM | POA: Insufficient documentation

## 2023-12-27 DIAGNOSIS — Z923 Personal history of irradiation: Secondary | ICD-10-CM | POA: Diagnosis not present

## 2023-12-27 DIAGNOSIS — I700 Atherosclerosis of aorta: Secondary | ICD-10-CM | POA: Diagnosis not present

## 2023-12-27 DIAGNOSIS — Z79899 Other long term (current) drug therapy: Secondary | ICD-10-CM | POA: Insufficient documentation

## 2023-12-27 DIAGNOSIS — Z803 Family history of malignant neoplasm of breast: Secondary | ICD-10-CM | POA: Insufficient documentation

## 2023-12-27 DIAGNOSIS — I5030 Unspecified diastolic (congestive) heart failure: Secondary | ICD-10-CM | POA: Diagnosis not present

## 2023-12-27 DIAGNOSIS — R1310 Dysphagia, unspecified: Secondary | ICD-10-CM | POA: Diagnosis not present

## 2023-12-27 DIAGNOSIS — Z86718 Personal history of other venous thrombosis and embolism: Secondary | ICD-10-CM | POA: Insufficient documentation

## 2023-12-27 DIAGNOSIS — I110 Hypertensive heart disease with heart failure: Secondary | ICD-10-CM | POA: Diagnosis not present

## 2023-12-27 DIAGNOSIS — K439 Ventral hernia without obstruction or gangrene: Secondary | ICD-10-CM | POA: Diagnosis not present

## 2023-12-27 DIAGNOSIS — C8118 Nodular sclerosis classical Hodgkin lymphoma, lymph nodes of multiple sites: Secondary | ICD-10-CM | POA: Insufficient documentation

## 2023-12-27 LAB — COMPREHENSIVE METABOLIC PANEL WITH GFR
ALT: 34 U/L (ref 0–44)
AST: 32 U/L (ref 15–41)
Albumin: 4.6 g/dL (ref 3.5–5.0)
Alkaline Phosphatase: 49 U/L (ref 38–126)
Anion gap: 12 (ref 5–15)
BUN: 13 mg/dL (ref 8–23)
CO2: 28 mmol/L (ref 22–32)
Calcium: 10 mg/dL (ref 8.9–10.3)
Chloride: 100 mmol/L (ref 98–111)
Creatinine, Ser: 0.61 mg/dL (ref 0.44–1.00)
GFR, Estimated: >60 mL/min (ref >60)
Glucose, Bld: 153 mg/dL — ABNORMAL HIGH (ref 70–99)
Potassium: 4.7 mmol/L (ref 3.5–5.1)
Sodium: 139 mmol/L (ref 135–145)
Total Bilirubin: 0.5 mg/dL (ref 0.0–1.2)
Total Protein: 7.5 g/dL (ref 6.5–8.1)

## 2023-12-27 LAB — LACTATE DEHYDROGENASE: LDH: 162 U/L (ref 98–192)

## 2023-12-27 LAB — VITAMIN D 25 HYDROXY (VIT D DEFICIENCY, FRACTURES): Vit D, 25-Hydroxy: 30.68 ng/mL (ref 30–100)

## 2024-01-01 ENCOUNTER — Ambulatory Visit (INDEPENDENT_AMBULATORY_CARE_PROVIDER_SITE_OTHER): Admitting: Podiatry

## 2024-01-01 DIAGNOSIS — M79674 Pain in right toe(s): Secondary | ICD-10-CM

## 2024-01-01 DIAGNOSIS — M79675 Pain in left toe(s): Secondary | ICD-10-CM | POA: Diagnosis not present

## 2024-01-01 DIAGNOSIS — E119 Type 2 diabetes mellitus without complications: Secondary | ICD-10-CM | POA: Diagnosis not present

## 2024-01-01 DIAGNOSIS — B351 Tinea unguium: Secondary | ICD-10-CM

## 2024-01-03 ENCOUNTER — Inpatient Hospital Stay: Payer: Medicare Other | Admitting: Oncology

## 2024-01-03 VITALS — BP 121/75 | HR 87 | Temp 98.1°F | Resp 16 | Wt 227.1 lb

## 2024-01-03 DIAGNOSIS — C8118 Nodular sclerosis classical Hodgkin lymphoma, lymph nodes of multiple sites: Secondary | ICD-10-CM | POA: Diagnosis not present

## 2024-01-03 DIAGNOSIS — D751 Secondary polycythemia: Secondary | ICD-10-CM

## 2024-01-03 NOTE — Progress Notes (Signed)
 [facility] [address] [place] [address]    Clinic Day:  01/03/2024  Referring physician: Shona Norleen PEDLAR, MD  Patient Care Team: Shona Norleen PEDLAR, MD as PCP - General (Internal Medicine) Elaine Norleen, MD (Inactive) as PCP - Infectious Diseases (Infectious Diseases) Plyler, Arland HERO, RD as Dietitian (Dietician)   ASSESSMENT & PLAN:   Assessment: 1.  Hodgkin's Lymphoma, nodular-sclerosing type involving multiple lymph nodes: -Left axilla pathology : CD30, CD15, and PAX-5 positive. -ABVD chemotherapy beginning on 09/13/2015, with discontinuation of Bleomycin  on 10/11/2015. -Mid-course PET scan revealed Deauville 3 lymphadenopathy, so she received 4 additional cycles of AVD. -Treatment course was complicated by diastolic heart failure. -Chemotherapy completed on 02/23/2016. -Post-treatment PET revealed mild residual Left axillary adenopathy with a Deauville 2, so she was referred for axillary radiation therapy. -Completed XRT to left axilla on 05/23/16. -repeat PET on 07/2016 showed mild residual low level metabolic activity within left axillary lymph nodes a similar to prior deauville 2.   2.  HIV: -She continues to follow up with Infectious disease.   3.  Chemo induced heart failure: -She continues to follow up with cardiology.   Plan: 1.  Hodgkin's Lymphoma, nodular-sclerosing type involving multiple lymph nodes: - She does not report any B symptoms or infections. - No lymphadenopathy on physical exam. - Reviewed labs from 12/27/23: Normal LDH.  CBC is grossly normal. - Recommend follow-up in 1 year with repeat labs and exam.   2.  Mild hypercalcemia: - She had mild elevated calcium level in the past.  Further workup including myeloma panel was normal.  Calcium has normalized at this visit.   3.  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 - Likely secondary erythrocytosis from diuretic use.  She denies  history of sleep apnea.  She is non-smoker.  Will continue to monitor.  Orders Placed This Encounter  Procedures   VITAMIN D  25 Hydroxy (Vit-D Deficiency, Fractures)    Standing Status:   Future    Expected Date:   12/25/2024    Expiration Date:   01/02/2025   Lactate dehydrogenase    Standing Status:   Future    Expected Date:   12/25/2024    Expiration Date:   01/02/2025   Comprehensive metabolic panel    Standing Status:   Future    Expected Date:   12/25/2024    Expiration Date:   01/02/2025   CBC with Differential    Standing Status:   Future    Expected Date:   01/02/2025    Expiration Date:   04/02/2025    Delon FORBES Hope, NP   11/13/20259:43 AM  CHIEF COMPLAINT:   Diagnosis: Hodgkin's disease, nodular sclerosis, of lymph nodes of multiple sites   Cancer Staging  Hodgkin's disease, nodular sclerosis, of lymph nodes of multiple sites [facility]) Staging form: Lymphoid Neoplasms, AJCC 6th Edition - Clinical stage from 08/31/2015: Stage III - Signed by Berry Debby RAMAN, PA-C on 09/01/2015    Prior Therapy: 1.  ABVD from 09/13/2015 to 02/23/2016. 2. XRT to left axilla through 05/23/2016.  Current Therapy:  Surveillance   HISTORY OF PRESENT ILLNESS:   Oncology History  Hodgkin's disease, nodular sclerosis, of lymph nodes of multiple sites (HCC)  07/23/2015 Procedure   Lext axilla lymph node biopsy   07/27/2015 Pathology Results   Classical [name], nodular sclerosis type.  Positive CD30, CD15, andPAX-5.  Negative CD45 and CD20.   08/31/2015 PET  scan   Hypermetabolic lymphadenopathy. Nodal stations include: L supraclavicular nodes, L axillary nodes, mediastinal lymph nodes, periaortic retroperitoneal and periportal lymph nodes as well as L iliac and inguinal lymph nodes. Two concerning pulm nodules...   09/01/2015 Imaging   MUGA- Normal LEFT ventricular ejection fraction of 73%.  Normal LEFT ventricular wall motion.   09/01/2015 Procedure   PFTs-  Spirometry shows  mild ventilatory defect without airflow obstruction and no bronchodilator improvement. Lung volumes are normal. Airway resistance is normal. DLCO normal. Essentially normal lung function   09/08/2015 Procedure   Port placed by Dr. Nicholaus   09/13/2015 - 02/23/2016 Chemotherapy   ABVD   09/27/2015 Imaging   CT angio of chest- no gross PE seen. Improving axillary, mediastinal and upper abdominal adenopathy. Improved and possibly resolved RUL nodule and with resolved or obscured LLL nodule. Small L pleural effusion and mild L lower lobe atelectasis.   10/11/2015 Treatment Plan Change   Bleomycin  discontinued    11/01/2015 PET scan   Clear interval response to therapy with decrease in size and hypermetabolism of larger lymph nodes seen previously and some of the smaller hypermetabolic lymph node seen on the previous study have resolved completely in terms of abnormal enlargement and hypermetabolism. 2. Slight decrease in left lower lobe pulmonary nodule with interval resolution of the right upper lobe pulmonary nodule. 3. Stable appearance of mild heterogeneous FDG accumulation within the marrow space.   11/01/2015 Miscellaneous   Deauville 3- 4 more cycles of AVD recommended according to NCCN guidelines.   02/09/2016 Imaging   US  left leg- Positive for acute DVT in left calf, popliteal and femoral veins.   03/22/2016 PET scan   1. Mild residual metabolic activity within LEFT axillary lymph nodes ( Deauville 2 ). 2. No additional hypermetabolic adenopathy identified. 3. Normal spleen and marrow activity.   04/25/2016 - 05/23/2016 Radiation Therapy   Residual disease treated with radiation Audry): Left axilla treated with 36 Gy in 20 fractions.   07/24/2016 PET scan   PET IMPRESSION: 1. Stable exam. The mild residual low level metabolic activity within left axillary lymph nodes a similar to prior (Deauville 2). 2. No new or progressive findings. 3. Abdominal aortic atherosclerosis.      11/23/2016 Imaging   CT C/A/P: 1. Essentially stable left axillary adenopathy. No new adenopathy identified. 2.  Aortic Atherosclerosis (ICD10-I70.0). 3. Small left adrenal adenoma. 4. Midline ventral hernia contains about 630 cc of omental adipose tissue.      INTERVAL HISTORY:   Holly is a 68 y.o. female presenting to clinic today for follow up of Hodgkin's disease, nodular sclerosis, of lymph nodes of multiple sites.   Today, she states that she is doing well overall. Her appetite level is at 100%. Her energy level is at 75%.   She denies any fevers, night sweats, weight loss, or new lumps/bumps in the last 6 months. She notes trouble swallowing occasionally. She states she does not have sleep apnea nor a history of smoking.  Reports headaches on occasion and numbness and burning in her feet and fingers.  Has a cough secondary to allergies.  In the interim, she denies any hospitalizations, surgeries or changes to baseline health.  She was seen by infectious disease and podiatry for chronic follow-up.  PAST MEDICAL HISTORY:   Past Medical History: Past Medical History:  Diagnosis Date   Acute deep vein thrombosis (DVT) of popliteal vein of left lower extremity (HCC) 02/09/2016   Arthritis    osteoarthritis  CHF (congestive heart failure) (HCC)    EF NL   Diabetes mellitus without complication (HCC)    History of radiation therapy 04/25/2016 - 05/23/2016   Left Axilla treated to 36 Gy in 20 fractions   [name], nodular sclerosis (HCC) 08/23/2015   Human immunodeficiency virus (HIV) disease (HCC) 02/23/2006   Qualifier: Diagnosis of  By: Elaine MD, John     Hypertension     Surgical History: Past Surgical History:  Procedure Laterality Date   AXILLARY LYMPH NODE BIOPSY Left 07/23/2015   Procedure: EXCISIONAL BIOPSY LEFT AXILLARY LYMPH NODE;  Surgeon: Selinda Artist Moats, MD;  Location: [facility];  Service: General;  Laterality: Left;   PORT-A-CATH REMOVAL N/A  01/09/2018   Procedure: MINOR REMOVAL PORT-A-CATH;  Surgeon: Mavis Anes, MD;  Location: [facility];  Service: General;  Laterality: N/A;   PORTACATH PLACEMENT Right 09/08/2015   Procedure: INSERTION OF CENTRAL VENOUS CATHETER WITH PORT FOR CHEMOTHERAPY;  Surgeon: Selinda Artist Moats, MD;  Location: [facility];  Service: General;  Laterality: Right;   SHOULDER SURGERY Right    removal of bone spur   TOTAL HIP ARTHROPLASTY Right 07/07/2021   Procedure: TOTAL HIP ARTHROPLASTY ANTERIOR APPROACH;  Surgeon: Fidel Rogue, MD;  Location: [facility];  Service: Orthopedics;  Laterality: Right;  150   WISDOM TOOTH EXTRACTION      Social History: Social History   Socioeconomic History   Marital status: Widowed    Spouse name: Not on file   Number of children: Not on file   Years of education: Not on file   Highest education level: Not on file  Occupational History   Not on file  Tobacco Use   Smoking status: Never   Smokeless tobacco: Never  Vaping Use   Vaping status: Never Used  Substance and Sexual Activity   Alcohol  use: No   Drug use: No   Sexual activity: Never    Comment: declined condoms 4/24  Other Topics Concern   Not on file  Social History Narrative   Not on file   Social Drivers of Health   Financial Resource Strain: Not on file  Food Insecurity: Not on file  Transportation Needs: Not on file  Physical Activity: Not on file  Stress: Not on file  Social Connections: Not on file  Intimate Partner Violence: Not At Risk (06/17/2020)   Humiliation, Afraid, Rape, and Kick questionnaire    Fear of Current or Ex-Partner: No    Emotionally Abused: No    Physically Abused: No    Sexually Abused: No    Family History: Family History  Problem Relation Age of Onset   Breast cancer Mother    Hypertension Mother    Cancer Mother 68       breast    Hypertension Father    Heart attack Father 12   Pulmonary embolism Sister    Parkinson's disease Brother     Current  Medications:  Current Outpatient Medications:    bictegravir-emtricitabine -tenofovir  AF (BIKTARVY ) 50-200-25 MG TABS tablet, TAKE 1 TABLET BY MOUTH 1 TIME A DAY., Disp: 30 tablet, Rfl: 11   cholecalciferol (VITAMIN D ) 1000 units tablet, Take 1,000 Units by mouth daily. , Disp: , Rfl:    doxycycline  (VIBRAMYCIN ) 100 MG capsule, , Disp: , Rfl:    ezetimibe (ZETIA) 10 MG tablet, Take 10 mg by mouth daily., Disp: , Rfl:    fenofibrate  160 MG tablet, Take 1 tablet by mouth daily., Disp: , Rfl:    furosemide  (LASIX )  20 MG tablet, Take 1 tablet (20 mg total) by mouth daily., Disp: 90 tablet, Rfl: 2   JARDIANCE  25 MG TABS tablet, Take 25 mg by mouth daily. , Disp: , Rfl:    levofloxacin (LEVAQUIN) 500 MG tablet, Take 1 tablet every 24 hours by oral route for 7 days. (Patient not taking: Reported on 05/23/2023), Disp: , Rfl:    metFORMIN (GLUCOPHAGE-XR) 500 MG 24 hr tablet, Take 500 mg by mouth 2 (two) times daily., Disp: , Rfl:    metoprolol  tartrate (LOPRESSOR ) 25 MG tablet, Take 1 tablet (25 mg total) by mouth daily., Disp: 30 tablet, Rfl: 0   Multiple Vitamin (MULTIVITAMIN WITH MINERALS) TABS tablet, Take 1 tablet by mouth daily., Disp: , Rfl:    OZEMPIC, 1 MG/DOSE, 4 MG/3ML SOPN, Inject 1 mg into the skin every Friday., Disp: , Rfl:    REPATHA  140 MG/ML SOSY, Inject 140 mg into the muscle every 14 (fourteen) days., Disp: , Rfl:    spironolactone  (ALDACTONE ) 25 MG tablet, TAKE 1 TABLET DAILY (Patient taking differently: Take 25 mg by mouth daily.), Disp: 90 tablet, Rfl: 3   vitamin E 400 UNIT capsule, Take 400 Units by mouth daily., Disp: , Rfl:    Allergies: Allergies  Allergen Reactions   Augmentin [Amoxicillin-Pot Clavulanate] Hives and Other (See Comments)    Has patient had a PCN reaction causing immediate rash, facial/tongue/throat swelling, SOB or lightheadedness with hypotension: No Has patient had a PCN reaction causing severe rash involving mucus membranes or skin necrosis: No Has patient  had a PCN reaction that required hospitalization No Has patient had a PCN reaction occurring within the last 10 years: No If all of the above answers are NO, then may proceed with Cephalosporin use.   Bleomycin  Cough   Cefpodoxime  Proxetil Itching and Rash    Pt unsure of allergy    Doxycycline  Rash    REVIEW OF SYSTEMS:   Review of Systems  Constitutional:  Positive for fatigue.  Respiratory:  Positive for cough.   Neurological:  Positive for headaches and numbness.     VITALS:   There were no vitals taken for this visit.  Wt Readings from Last 3 Encounters:  05/23/23 232 lb (105.2 kg)  01/04/23 232 lb 6.4 oz (105.4 kg)  05/25/22 236 lb (107 kg)    There is no height or weight on file to calculate BMI.  Performance status (ECOG): 1 - Symptomatic but completely ambulatory  PHYSICAL EXAM:   Physical Exam Constitutional:      Appearance: Normal appearance.  HENT:     Head: Normocephalic and atraumatic.  Eyes:     Pupils: Pupils are equal, round, and reactive to light.  Cardiovascular:     Rate and Rhythm: Normal rate and regular rhythm.     Heart sounds: Normal heart sounds. No murmur heard. Pulmonary:     Effort: Pulmonary effort is normal.     Breath sounds: Normal breath sounds. No wheezing.  Abdominal:     General: Bowel sounds are normal. There is no distension.     Palpations: Abdomen is soft.     Tenderness: There is no abdominal tenderness.  Musculoskeletal:        General: Normal range of motion.     Cervical back: Normal range of motion.  Skin:    General: Skin is warm and dry.     Findings: No rash.  Neurological:     Mental Status: She is alert and oriented to person, place,  and time.  Psychiatric:        Judgment: Judgment normal.     LABS:      Latest Ref Rng & Units 05/09/2023    8:42 AM 12/28/2022    9:38 AM 05/11/2022    8:47 AM  CBC  WBC 3.8 - 10.8 Thousand/uL 7.2  6.3  6.5   Hemoglobin 11.7 - 15.5 g/dL 82.9  83.1  82.3    Hematocrit 35.0 - 45.0 % 49.9  51.9  51.5   Platelets 140 - 400 Thousand/uL 258  253  277       Latest Ref Rng & Units 12/27/2023    9:31 AM 05/09/2023    8:42 AM 12/28/2022    9:38 AM  CMP  Glucose 70 - 99 mg/dL 846  849  835   BUN 8 - 23 mg/dL 13  16  18    Creatinine 0.44 - 1.00 mg/dL 9.38  9.39  9.24   Sodium 135 - 145 mmol/L 139  139  139   Potassium 3.5 - 5.1 mmol/L 4.7  4.4  4.1   Chloride 98 - 111 mmol/L 100  100  102   CO2 22 - 32 mmol/L 28  30  25    Calcium 8.9 - 10.3 mg/dL 89.9  89.7  89.9   Total Protein 6.5 - 8.1 g/dL 7.5  7.2  7.7   Total Bilirubin 0.0 - 1.2 mg/dL 0.5  0.4  0.7   Alkaline Phos 38 - 126 U/L 49   45   AST 15 - 41 U/L 32  23  27   ALT 0 - 44 U/L 34  30  28      No results found for: CEA1, CEA / No results found for: CEA1, CEA No results found for: PSA1 No results found for: CAN199 No results found for: RJW874  Lab Results  Component Value Date   TOTALPROTELP 7.0 12/27/2021   TOTALPROTELP 6.5 12/27/2021   ALBUMINELP 3.8 12/27/2021   A1GS 0.2 12/27/2021   A2GS 0.8 12/27/2021   BETS 1.1 12/27/2021   GAMS 0.6 12/27/2021   MSPIKE Not Observed 12/27/2021   SPEI Comment 12/27/2021   No results found for: TIBC, FERRITIN, IRONPCTSAT Lab Results  Component Value Date   LDH 162 12/27/2023   LDH 122 12/28/2022   LDH 117 12/27/2021     STUDIES:   MM 3D SCREENING MAMMOGRAM BILATERAL BREAST Result Date: 12/25/2023 CLINICAL DATA:  Screening. EXAM: DIGITAL SCREENING BILATERAL MAMMOGRAM WITH TOMOSYNTHESIS AND CAD TECHNIQUE: Bilateral screening digital craniocaudal and mediolateral oblique mammograms were obtained. Bilateral screening digital breast tomosynthesis was performed. The images were evaluated with computer-aided detection. COMPARISON:  Previous exam(s). ACR Breast Density Category c: The breasts are heterogeneously dense, which may obscure small masses. FINDINGS: There are no findings suspicious for malignancy. IMPRESSION: No  mammographic evidence of malignancy. A result letter of this screening mammogram will be mailed directly to the patient. RECOMMENDATION: Screening mammogram in one year. (Code:SM-B-01Y) BI-RADS CATEGORY  1: Negative. Electronically Signed   By: Norleen Croak M.D.   On: 12/25/2023 07:58

## 2024-01-10 ENCOUNTER — Encounter: Payer: Self-pay | Admitting: Podiatry

## 2024-01-10 NOTE — Progress Notes (Signed)
 Subjective:  Patient ID: Holly Stephens, female    DOB: 15-Nov-1955,  MRN: [id number]  Holly Stephens presents to clinic today for preventative diabetic foot care for painful thick toenails that are difficult to trim. Pain interferes with ambulation. Aggravating factors include wearing enclosed shoe gear. Pain is relieved with periodic professional debridement.  Chief Complaint  Patient presents with   Nail Problem    Thick painful toenails, 3 month follow up    New problem(s): None.   PCP is Shona Norleen PEDLAR, MD.LOV 11/07/2023.  Allergies  Allergen Reactions   Augmentin [Amoxicillin-Pot Clavulanate] Hives and Other (See Comments)    Has patient had a PCN reaction causing immediate rash, facial/tongue/throat swelling, SOB or lightheadedness with hypotension: No Has patient had a PCN reaction causing severe rash involving mucus membranes or skin necrosis: No Has patient had a PCN reaction that required hospitalization No Has patient had a PCN reaction occurring within the last 10 years: No If all of the above answers are NO, then may proceed with Cephalosporin use.   Bleomycin  Cough   Cefpodoxime  Proxetil Itching and Rash    Pt unsure of allergy    Doxycycline  Rash and Dermatitis    Review of Systems: Negative except as noted in the HPI.  Objective: No changes noted in today's physical examination. There were no vitals filed for this visit. Holly Stephens is a pleasant 68 y.o. female obese in NAD. AAO x 3.  Vascular Examination: Capillary refill time immediate b/l. Palpable pedal pulses. Pedal hair present b/l. Pedal edema absent b/l. No pain with calf compression b/l. Skin temperature gradient WNL b/l. No cyanosis or clubbing b/l. No ischemia or gangrene noted b/l.   Neurological Examination: Sensation grossly intact b/l with 10 gram monofilament. Vibratory sensation intact b/l. Pt has subjective symptoms of neuropathy.  Dermatological Examination: Pedal skin with normal  turgor, texture and tone b/l.  No open wounds. No interdigital macerations.   Toenails 1-5 b/l thick, discolored, elongated with subungual debris and pain on dorsal palpation.   No corns, calluses, nor porokeratotic lesions.  Musculoskeletal Examination: Muscle strength 5/5 to all lower extremity muscle groups bilaterally. No pain, crepitus or joint limitation noted with ROM bilateral LE. Pes planus deformity noted bilateral LE. Patient ambulates independent of any assistive aids.  Radiographs: None Assessment/Plan: 1. Pain due to onychomycosis of toenails of both feet   2. Controlled type 2 diabetes mellitus without complication, without long-term current use of insulin  [facility])   Patient was evaluated and treated. All patient's and/or POA's questions/concerns addressed on today's visit. Toenails 1-5 b/l debrided in length and girth without incident. Continue foot and shoe inspections daily. Monitor blood glucose per PCP/Endocrinologist's recommendations. Continue soft, supportive shoe gear daily. Report any pedal injuries to medical professional. Call office if there are any questions/concerns. -Patient/POA to call should there be question/concern in the interim.   Return in about 3 months (around 04/02/2024).  Holly Stephens, DPM      [facility] LOCATION: [address] [place] [address]                   Office [phone]   [facility] LOCATION: [address] [place], [place] [address]  Office [phone]

## 2024-02-18 ENCOUNTER — Encounter: Payer: Self-pay | Admitting: *Deleted

## 2024-04-09 ENCOUNTER — Other Ambulatory Visit

## 2024-04-16 ENCOUNTER — Ambulatory Visit: Admitting: Podiatry

## 2024-04-23 ENCOUNTER — Ambulatory Visit: Payer: Self-pay | Admitting: Internal Medicine

## 2024-04-23 ENCOUNTER — Ambulatory Visit: Admitting: Internal Medicine

## 2024-12-26 ENCOUNTER — Inpatient Hospital Stay

## 2025-01-02 ENCOUNTER — Inpatient Hospital Stay: Admitting: Oncology
